# Patient Record
Sex: Female | Born: 1935 | Race: White | Hispanic: No | Smoking: Never smoker
Health system: Southern US, Community
[De-identification: ages and names within clinical notes are randomized; demographics above are authoritative.]

## PROBLEM LIST (undated history)

## (undated) DIAGNOSIS — C50911 Malignant neoplasm of unspecified site of right female breast: Secondary | ICD-10-CM

## (undated) DIAGNOSIS — F0390 Unspecified dementia without behavioral disturbance: Secondary | ICD-10-CM

## (undated) DIAGNOSIS — IMO0001 Reserved for inherently not codable concepts without codable children: Secondary | ICD-10-CM

## (undated) DIAGNOSIS — K3184 Gastroparesis: Secondary | ICD-10-CM

## (undated) DIAGNOSIS — M810 Age-related osteoporosis without current pathological fracture: Principal | ICD-10-CM

## (undated) DIAGNOSIS — K219 Gastro-esophageal reflux disease without esophagitis: Secondary | ICD-10-CM

## (undated) DIAGNOSIS — C801 Malignant (primary) neoplasm, unspecified: Secondary | ICD-10-CM

## (undated) DIAGNOSIS — K8020 Calculus of gallbladder without cholecystitis without obstruction: Secondary | ICD-10-CM

## (undated) DIAGNOSIS — I10 Essential (primary) hypertension: Secondary | ICD-10-CM

## (undated) DIAGNOSIS — N289 Disorder of kidney and ureter, unspecified: Secondary | ICD-10-CM

## (undated) HISTORY — DX: Malignant neoplasm of unspecified site of right female breast: C50.911

## (undated) HISTORY — DX: Essential (primary) hypertension: I10

## (undated) HISTORY — DX: Gastro-esophageal reflux disease without esophagitis: K21.9

## (undated) HISTORY — DX: Age-related osteoporosis without current pathological fracture: M81.0

## (undated) HISTORY — PX: BREAST LUMPECTOMY: SHX2

## (undated) HISTORY — DX: Gastroparesis: K31.84

## (undated) HISTORY — DX: Reserved for inherently not codable concepts without codable children: IMO0001

## (undated) HISTORY — DX: Calculus of gallbladder without cholecystitis without obstruction: K80.20

## (undated) HISTORY — PX: TONSILLECTOMY AND ADENOIDECTOMY: SHX28

## (undated) HISTORY — DX: Unspecified dementia, unspecified severity, without behavioral disturbance, psychotic disturbance, mood disturbance, and anxiety: F03.90

## (undated) HISTORY — PX: BREAST BIOPSY: SHX20

---

## 1999-05-12 ENCOUNTER — Encounter: Payer: Self-pay | Admitting: Emergency Medicine

## 1999-05-12 ENCOUNTER — Emergency Department (HOSPITAL_COMMUNITY): Admission: EM | Admit: 1999-05-12 | Discharge: 1999-05-12 | Payer: Self-pay | Admitting: Emergency Medicine

## 2007-05-10 HISTORY — PX: COLONOSCOPY: SHX174

## 2007-05-17 ENCOUNTER — Inpatient Hospital Stay (HOSPITAL_COMMUNITY): Admission: EM | Admit: 2007-05-17 | Discharge: 2007-05-22 | Payer: Self-pay | Admitting: Emergency Medicine

## 2007-05-18 ENCOUNTER — Encounter (INDEPENDENT_AMBULATORY_CARE_PROVIDER_SITE_OTHER): Payer: Self-pay | Admitting: Emergency Medicine

## 2007-06-21 ENCOUNTER — Encounter: Admission: RE | Admit: 2007-06-21 | Discharge: 2007-06-21 | Payer: Self-pay | Admitting: Internal Medicine

## 2007-08-13 ENCOUNTER — Ambulatory Visit: Payer: Self-pay | Admitting: Gastroenterology

## 2007-08-24 ENCOUNTER — Ambulatory Visit: Payer: Self-pay | Admitting: Gastroenterology

## 2008-10-21 ENCOUNTER — Encounter: Admission: RE | Admit: 2008-10-21 | Discharge: 2008-10-21 | Payer: Self-pay | Admitting: Internal Medicine

## 2008-11-14 ENCOUNTER — Emergency Department (HOSPITAL_COMMUNITY): Admission: EM | Admit: 2008-11-14 | Discharge: 2008-11-14 | Payer: Self-pay

## 2009-07-28 ENCOUNTER — Encounter: Admission: RE | Admit: 2009-07-28 | Discharge: 2009-07-28 | Payer: Self-pay | Admitting: Internal Medicine

## 2009-08-17 ENCOUNTER — Encounter: Admission: RE | Admit: 2009-08-17 | Discharge: 2009-08-17 | Payer: Self-pay | Admitting: Internal Medicine

## 2009-08-20 ENCOUNTER — Ambulatory Visit: Payer: Self-pay | Admitting: Oncology

## 2009-08-24 LAB — CBC WITH DIFFERENTIAL/PLATELET
Basophils Absolute: 0 10*3/uL (ref 0.0–0.1)
EOS%: 1.6 % (ref 0.0–7.0)
HGB: 12.4 g/dL (ref 11.6–15.9)
MCH: 31.6 pg (ref 25.1–34.0)
MONO#: 0.5 10*3/uL (ref 0.1–0.9)
NEUT#: 3.6 10*3/uL (ref 1.5–6.5)
RDW: 14.1 % (ref 11.2–14.5)
WBC: 5.8 10*3/uL (ref 3.9–10.3)
lymph#: 1.6 10*3/uL (ref 0.9–3.3)

## 2009-08-24 LAB — COMPREHENSIVE METABOLIC PANEL
ALT: 9 U/L (ref 0–35)
AST: 14 U/L (ref 0–37)
Albumin: 4.3 g/dL (ref 3.5–5.2)
BUN: 22 mg/dL (ref 6–23)
Calcium: 9.5 mg/dL (ref 8.4–10.5)
Chloride: 105 mEq/L (ref 96–112)
Potassium: 4.2 mEq/L (ref 3.5–5.3)

## 2009-11-11 ENCOUNTER — Emergency Department (HOSPITAL_COMMUNITY): Admission: EM | Admit: 2009-11-11 | Discharge: 2009-11-11 | Payer: Self-pay | Admitting: Emergency Medicine

## 2009-11-12 ENCOUNTER — Ambulatory Visit: Payer: Self-pay | Admitting: Oncology

## 2009-11-16 ENCOUNTER — Encounter (INDEPENDENT_AMBULATORY_CARE_PROVIDER_SITE_OTHER): Payer: Self-pay | Admitting: *Deleted

## 2009-11-16 ENCOUNTER — Encounter: Admission: RE | Admit: 2009-11-16 | Discharge: 2009-11-16 | Payer: Self-pay | Admitting: Internal Medicine

## 2009-11-16 LAB — COMPREHENSIVE METABOLIC PANEL
AST: 15 U/L (ref 0–37)
Albumin: 4.8 g/dL (ref 3.5–5.2)
BUN: 23 mg/dL (ref 6–23)
CO2: 24 mEq/L (ref 19–32)
Calcium: 10.5 mg/dL (ref 8.4–10.5)
Chloride: 107 mEq/L (ref 96–112)
Glucose, Bld: 164 mg/dL — ABNORMAL HIGH (ref 70–99)
Potassium: 3.7 mEq/L (ref 3.5–5.3)

## 2009-11-16 LAB — CBC WITH DIFFERENTIAL/PLATELET
Basophils Absolute: 0 10*3/uL (ref 0.0–0.1)
Eosinophils Absolute: 0 10*3/uL (ref 0.0–0.5)
HCT: 43.7 % (ref 34.8–46.6)
HGB: 14.2 g/dL (ref 11.6–15.9)
MONO#: 0.5 10*3/uL (ref 0.1–0.9)
NEUT#: 6.7 10*3/uL — ABNORMAL HIGH (ref 1.5–6.5)
NEUT%: 79 % — ABNORMAL HIGH (ref 38.4–76.8)
RDW: 13.4 % (ref 11.2–14.5)
lymph#: 1.3 10*3/uL (ref 0.9–3.3)

## 2009-12-01 ENCOUNTER — Encounter: Payer: Self-pay | Admitting: Nurse Practitioner

## 2009-12-01 ENCOUNTER — Telehealth: Payer: Self-pay | Admitting: Gastroenterology

## 2009-12-02 DIAGNOSIS — F0391 Unspecified dementia with behavioral disturbance: Secondary | ICD-10-CM

## 2009-12-02 DIAGNOSIS — F03918 Unspecified dementia, unspecified severity, with other behavioral disturbance: Secondary | ICD-10-CM | POA: Insufficient documentation

## 2009-12-02 DIAGNOSIS — C50911 Malignant neoplasm of unspecified site of right female breast: Secondary | ICD-10-CM | POA: Insufficient documentation

## 2009-12-02 HISTORY — DX: Malignant neoplasm of unspecified site of right female breast: C50.911

## 2009-12-03 ENCOUNTER — Ambulatory Visit: Payer: Self-pay | Admitting: Internal Medicine

## 2009-12-03 ENCOUNTER — Encounter: Payer: Self-pay | Admitting: Gastroenterology

## 2009-12-03 DIAGNOSIS — R112 Nausea with vomiting, unspecified: Secondary | ICD-10-CM

## 2009-12-03 DIAGNOSIS — R634 Abnormal weight loss: Secondary | ICD-10-CM | POA: Insufficient documentation

## 2009-12-03 DIAGNOSIS — R1314 Dysphagia, pharyngoesophageal phase: Secondary | ICD-10-CM

## 2009-12-04 ENCOUNTER — Ambulatory Visit (HOSPITAL_COMMUNITY): Admission: RE | Admit: 2009-12-04 | Discharge: 2009-12-04 | Payer: Self-pay | Admitting: Internal Medicine

## 2009-12-11 ENCOUNTER — Ambulatory Visit: Payer: Self-pay | Admitting: Gastroenterology

## 2009-12-11 HISTORY — PX: ESOPHAGOGASTRODUODENOSCOPY: SHX1529

## 2010-01-12 ENCOUNTER — Ambulatory Visit: Payer: Self-pay | Admitting: Oncology

## 2010-01-13 LAB — COMPREHENSIVE METABOLIC PANEL
BUN: 14 mg/dL (ref 6–23)
CO2: 27 mEq/L (ref 19–32)
Calcium: 9.8 mg/dL (ref 8.4–10.5)
Chloride: 105 mEq/L (ref 96–112)
Creatinine, Ser: 1.4 mg/dL — ABNORMAL HIGH (ref 0.40–1.20)
Total Bilirubin: 0.6 mg/dL (ref 0.3–1.2)

## 2010-01-13 LAB — CBC WITH DIFFERENTIAL/PLATELET
BASO%: 0.4 % (ref 0.0–2.0)
Basophils Absolute: 0 10*3/uL (ref 0.0–0.1)
EOS%: 1.4 % (ref 0.0–7.0)
HCT: 34.8 % (ref 34.8–46.6)
HGB: 11.6 g/dL (ref 11.6–15.9)
LYMPH%: 30.1 % (ref 14.0–49.7)
MCH: 30.7 pg (ref 25.1–34.0)
MCHC: 33.3 g/dL (ref 31.5–36.0)
NEUT%: 60 % (ref 38.4–76.8)
Platelets: 222 10*3/uL (ref 145–400)
lymph#: 1.8 10*3/uL (ref 0.9–3.3)

## 2010-01-13 LAB — MAGNESIUM: Magnesium: 2.3 mg/dL (ref 1.5–2.5)

## 2010-04-12 ENCOUNTER — Ambulatory Visit (HOSPITAL_BASED_OUTPATIENT_CLINIC_OR_DEPARTMENT_OTHER): Payer: Medicare Other | Admitting: Oncology

## 2010-04-14 LAB — COMPREHENSIVE METABOLIC PANEL
ALT: 45 U/L — ABNORMAL HIGH (ref 0–35)
AST: 27 U/L (ref 0–37)
Albumin: 3.5 g/dL (ref 3.5–5.2)
Calcium: 9.5 mg/dL (ref 8.4–10.5)
Chloride: 110 mEq/L (ref 96–112)
Potassium: 4.5 mEq/L (ref 3.5–5.3)
Sodium: 147 mEq/L — ABNORMAL HIGH (ref 135–145)

## 2010-04-14 LAB — CBC WITH DIFFERENTIAL/PLATELET
BASO%: 0.5 % (ref 0.0–2.0)
EOS%: 1.5 % (ref 0.0–7.0)
HGB: 12.6 g/dL (ref 11.6–15.9)
MCH: 29.2 pg (ref 25.1–34.0)
MCHC: 31.8 g/dL (ref 31.5–36.0)
RDW: 12.8 % (ref 11.2–14.5)
lymph#: 1.7 10*3/uL (ref 0.9–3.3)

## 2010-04-29 ENCOUNTER — Inpatient Hospital Stay (HOSPITAL_COMMUNITY): Admission: EM | Admit: 2010-04-29 | Discharge: 2010-05-03 | Payer: Self-pay | Source: Home / Self Care

## 2010-05-06 ENCOUNTER — Telehealth: Payer: Self-pay | Admitting: Gastroenterology

## 2010-05-10 ENCOUNTER — Inpatient Hospital Stay (HOSPITAL_COMMUNITY)
Admission: EM | Admit: 2010-05-10 | Discharge: 2010-05-17 | Payer: Self-pay | Source: Home / Self Care | Attending: Internal Medicine | Admitting: Internal Medicine

## 2010-05-12 LAB — DIFFERENTIAL
Basophils Absolute: 0 10*3/uL (ref 0.0–0.1)
Basophils Relative: 0 % (ref 0–1)
Eosinophils Absolute: 0 10*3/uL (ref 0.0–0.7)
Eosinophils Relative: 0 % (ref 0–5)
Lymphocytes Relative: 23 % (ref 12–46)
Lymphs Abs: 1.7 10*3/uL (ref 0.7–4.0)
Monocytes Absolute: 0.4 10*3/uL (ref 0.1–1.0)
Monocytes Relative: 6 % (ref 3–12)
Neutro Abs: 5.4 10*3/uL (ref 1.7–7.7)
Neutrophils Relative %: 71 % (ref 43–77)

## 2010-05-12 LAB — HEPATIC FUNCTION PANEL
ALT: 28 U/L (ref 0–35)
AST: 18 U/L (ref 0–37)
Albumin: 4.1 g/dL (ref 3.5–5.2)
Alkaline Phosphatase: 60 U/L (ref 39–117)
Bilirubin, Direct: 0.1 mg/dL (ref 0.0–0.3)
Indirect Bilirubin: 0.4 mg/dL (ref 0.3–0.9)
Total Bilirubin: 0.5 mg/dL (ref 0.3–1.2)
Total Protein: 7.5 g/dL (ref 6.0–8.3)

## 2010-05-12 LAB — CBC
HCT: 42.2 % (ref 36.0–46.0)
Hemoglobin: 13.2 g/dL (ref 12.0–15.0)
MCH: 29.3 pg (ref 26.0–34.0)
MCHC: 31.3 g/dL (ref 30.0–36.0)
MCV: 93.6 fL (ref 78.0–100.0)
Platelets: 230 10*3/uL (ref 150–400)
RBC: 4.51 MIL/uL (ref 3.87–5.11)
RDW: 13.9 % (ref 11.5–15.5)
WBC: 7.6 10*3/uL (ref 4.0–10.5)

## 2010-05-12 LAB — BASIC METABOLIC PANEL
BUN: 18 mg/dL (ref 6–23)
CO2: 23 mEq/L (ref 19–32)
Calcium: 9.9 mg/dL (ref 8.4–10.5)
Chloride: 119 mEq/L — ABNORMAL HIGH (ref 96–112)
Creatinine, Ser: 1.65 mg/dL — ABNORMAL HIGH (ref 0.4–1.2)
GFR calc Af Amer: 37 mL/min — ABNORMAL LOW (ref >60)
GFR calc non Af Amer: 31 mL/min — ABNORMAL LOW (ref >60)
Glucose, Bld: 108 mg/dL — ABNORMAL HIGH (ref 70–99)
Potassium: 4.1 mEq/L (ref 3.5–5.1)
Sodium: 152 mEq/L — ABNORMAL HIGH (ref 135–145)

## 2010-05-14 LAB — DIFFERENTIAL
Basophils Absolute: 0 10*3/uL (ref 0.0–0.1)
Basophils Relative: 0 % (ref 0–1)
Eosinophils Absolute: 0.1 10*3/uL (ref 0.0–0.7)
Eosinophils Relative: 1 % (ref 0–5)
Lymphocytes Relative: 33 % (ref 12–46)
Lymphs Abs: 2.5 10*3/uL (ref 0.7–4.0)
Monocytes Absolute: 0.5 10*3/uL (ref 0.1–1.0)
Monocytes Relative: 7 % (ref 3–12)
Neutro Abs: 4.3 10*3/uL (ref 1.7–7.7)
Neutrophils Relative %: 58 % (ref 43–77)

## 2010-05-14 LAB — COMPREHENSIVE METABOLIC PANEL
ALT: 21 U/L (ref 0–35)
AST: 17 U/L (ref 0–37)
Albumin: 3.3 g/dL — ABNORMAL LOW (ref 3.5–5.2)
Alkaline Phosphatase: 47 U/L (ref 39–117)
BUN: 17 mg/dL (ref 6–23)
CO2: 27 mEq/L (ref 19–32)
Calcium: 9.5 mg/dL (ref 8.4–10.5)
Chloride: 111 mEq/L (ref 96–112)
Creatinine, Ser: 1.65 mg/dL — ABNORMAL HIGH (ref 0.4–1.2)
GFR calc Af Amer: 37 mL/min — ABNORMAL LOW (ref >60)
GFR calc non Af Amer: 31 mL/min — ABNORMAL LOW (ref >60)
Glucose, Bld: 93 mg/dL (ref 70–99)
Potassium: 4.2 mEq/L (ref 3.5–5.1)
Sodium: 146 mEq/L — ABNORMAL HIGH (ref 135–145)
Total Bilirubin: 0.5 mg/dL (ref 0.3–1.2)
Total Protein: 5.9 g/dL — ABNORMAL LOW (ref 6.0–8.3)

## 2010-05-14 LAB — CBC
HCT: 37.3 % (ref 36.0–46.0)
Hemoglobin: 11.5 g/dL — ABNORMAL LOW (ref 12.0–15.0)
MCH: 28.8 pg (ref 26.0–34.0)
MCHC: 30.8 g/dL (ref 30.0–36.0)
MCV: 93.5 fL (ref 78.0–100.0)
Platelets: 191 10*3/uL (ref 150–400)
RBC: 3.99 MIL/uL (ref 3.87–5.11)
RDW: 13.5 % (ref 11.5–15.5)
WBC: 7.5 10*3/uL (ref 4.0–10.5)

## 2010-05-14 LAB — GLUCOSE, CAPILLARY: Glucose-Capillary: 96 mg/dL (ref 70–99)

## 2010-05-17 ENCOUNTER — Inpatient Hospital Stay
Admission: AD | Admit: 2010-05-17 | Discharge: 2010-11-18 | Disposition: A | Discharge status: Other Acute Inpatient Hospital | Payer: Self-pay | Source: Home / Self Care | Attending: Internal Medicine | Admitting: Internal Medicine

## 2010-05-17 DIAGNOSIS — Z79811 Long term (current) use of aromatase inhibitors: Secondary | ICD-10-CM

## 2010-05-17 DIAGNOSIS — Z853 Personal history of malignant neoplasm of breast: Secondary | ICD-10-CM

## 2010-05-17 DIAGNOSIS — Z09 Encounter for follow-up examination after completed treatment for conditions other than malignant neoplasm: Principal | ICD-10-CM

## 2010-05-17 DIAGNOSIS — N63 Unspecified lump in unspecified breast: Secondary | ICD-10-CM

## 2010-05-17 DIAGNOSIS — N6310 Unspecified lump in the right breast, unspecified quadrant: Secondary | ICD-10-CM

## 2010-05-17 HISTORY — DX: Malignant (primary) neoplasm, unspecified: C80.1

## 2010-06-01 ENCOUNTER — Ambulatory Visit (HOSPITAL_COMMUNITY)
Admission: RE | Admit: 2010-06-01 | Discharge: 2010-06-01 | Payer: Self-pay | Source: Home / Self Care | Attending: Internal Medicine | Admitting: Internal Medicine

## 2010-06-08 NOTE — Procedures (Signed)
Summary: Colonoscopy   Colonoscopy  Procedure date:  08/24/2007  Findings:      Results: Normal. Location:  [place].    Procedures Next Due Date:    Colonoscopy: 09/2017  Patient Name: [name], [name] MRN:  Procedure Procedures: Colorectal cancer screening, average risk CPT: G0121.  Personnel: Endoscopist: Pricilla Riffle. Fuller Plan, MD, [facility].  Referred By: Crist Infante, MD.  Exam Location: Exam performed in Outpatient Clinic. Outpatient  Patient Consent: Procedure, Alternatives, Risks and Benefits discussed, consent obtained, from patient. Consent was obtained by the RN.  Indications  Average Risk Screening Routine.  History  Current Medications: Patient is not currently taking Coumadin.  Pre-Exam Physical: Performed Aug 24, 2007. Cardio-pulmonary exam, Rectal exam, HEENT exam , Abdominal exam, Mental status exam WNL.  Comments: Pt. history reviewed/updated, physical exam performed prior to initiation of sedation?Yes Exam Exam: Extent of exam reached: Cecum, extent intended: Cecum.  The cecum was identified by appendiceal orifice and IC valve. Time to Cecum: 00:03: 14. Time for Withdrawl: 00:10:44. Colon retroflexion performed. ASA Classification: II. Tolerance: excellent.  Monitoring: Pulse and BP monitoring, Oximetry used. Supplemental O2 given.  Colon Prep Used MoviPrep for colon prep. Prep results: good.  Sedation Meds: Patient assessed and found to be appropriate for moderate (conscious) sedation. Fentanyl 50 mcg. given IV. Versed 7 mg. given IV.  Findings NORMAL EXAM: Cecum to Rectum.   Assessment Normal examination.  Events  Unplanned Interventions: No intervention was required.  Unplanned Events: There were no complications. Plans  Post Exam Instructions: Post sedation instructions given.  Patient Education: Patient given standard instructions for: a normal exam.  Disposition: After procedure patient sent to recovery. After  recovery patient sent home.  Scheduling/Referral: Colonoscopy, to [facility] T. Fuller Plan, MD, [facility], around Aug 23, 2017.    cc: Crist Infante, MD   This report was created from the original endoscopy report, which was reviewed and signed by the above listed endoscopist.

## 2010-06-08 NOTE — Procedures (Signed)
Summary: Upper Endoscopy  Patient: [name] Note: All result statuses are Final unless otherwise noted.  Tests: (1) Upper Endoscopy (EGD)   EGD Upper Endoscopy       [facility]     [place], [place]  [id number]           ENDOSCOPY PROCEDURE REPORT           PATIENT:  [name], [name]  MR#:  [id number]     BIRTHDATE:  1936/05/02, 73 yrs. old  GENDER:  female     ENDOSCOPIST:  Norberto Sorenson T. Fuller Plan, MD, [facility]           PROCEDURE DATE:  12/11/2009     PROCEDURE:  EGD, diagnostic     ASA CLASS:  Class II     INDICATIONS:  nausea and vomiting, weight loss, dysphagia     MEDICATIONS:  Fentanyl 25 mcg IV, Versed 2 mg IV     TOPICAL ANESTHETIC:  Exactacain Spray     DESCRIPTION OF PROCEDURE:   After the risks benefits and     alternatives of the procedure were thoroughly explained, informed     consent was obtained.  The LB GIF-H180 [id number] endoscope was     introduced through the mouth and advanced to the second portion of     the duodenum, without limitations.  The instrument was slowly     withdrawn as the mucosa was fully examined.     <<PROCEDUREIMAGES>>     The esophagus and gastroesophageal junction were completely normal     in appearance.  The stomach was entered and closely examined. The     pylorus, antrum, angularis, and lesser curvature were well     visualized, including a retroflexed view of the cardia and fundus.     The stomach wall was normally distensable. The scope passed easily     through the pylorus into the duodenum. The duodenal bulb was     normal in appearance, as was the postbulbar duodenum. Retroflexed     views revealed a hiatal hernia, small.  The scope was then     withdrawn from the patient and the procedure completed.           COMPLICATIONS:  None           ENDOSCOPIC IMPRESSION:     1) Small hiatal hernia           RECOMMENDATIONS:     1) Anti-reflux regimen, elevated HOB to 30 degrees or more or     use 4" bedblocks  long term     2) PPI bid: omeprazole 20 mg po bid     3) follow-up with PCP as planned     4) No cause for N/ or /wt. loss uncovered. Treat GERD more     aggressively for now. Dysphagia likely oropharyngeal and related     to dementia.           Pricilla Riffle. Fuller Plan, MD, [facility]           CC:  Crist Infante, MD           n.     Lorrin MaisPricilla Riffle. Abigael Mogle at 12/11/2009 04:08 PM           [facility], [id number]  Note: An exclamation mark (!) indicates a result that was not dispersed into the flowsheet. Document Creation Date: 12/11/2009 4:09 PM _______________________________________________________________________  [name]  1) Order result status: Final Collection or observation date-time: 12/11/2009 16:03 Requested date-time:  Receipt date-time:  Reported date-time:  Referring Physician:   Ordering Physician: Lucio Edward [phone]) Specimen Source:  Source: Tawanna Cooler Order Number: [id number] Lab site:

## 2010-06-08 NOTE — Letter (Signed)
Summary: EGD Instructions  [facility]  [place], [place] [address]   Phone: [phone]  Fax: [phone]       [name]    1935-07-21    MRN: [id number]       Procedure Day /Date: 12-11-09     Arrival Time: 3:00 PM      Procedure Time: 4:00 PM     Location of Procedure:                    X     [place] (4th Floor)    PREPARATION FOR ENDOSCOPY   On  12-11-09 THE DAY OF THE PROCEDURE:  1.   No solid foods, milk or milk products are allowed after midnight the night before your procedure.  2.   Do not drink anything colored red or purple.  Avoid juices with pulp.  No orange juice.  3.  You may drink clear liquids until 2:00 PM , which is 2 hours before your procedure.                                                                                                CLEAR LIQUIDS INCLUDE: Water Jello Ice Popsicles Tea (sugar ok, no milk/cream) Powdered fruit flavored drinks Coffee (sugar ok, no milk/cream) Gatorade Juice: apple, white grape, white cranberry  Lemonade Clear bullion, consomm, broth Carbonated beverages (any kind) Strained chicken noodle soup Hard Candy   MEDICATION INSTRUCTIONS  Unless otherwise instructed, you should take regular prescription medications with a small sip of water as early as possible the morning of your procedure.       OTHER INSTRUCTIONS  You will need a responsible adult at least 73 years of age to accompany you and drive you home.   This person must remain in the waiting room during your procedure.  Wear loose fitting clothing that is easily removed.  Leave jewelry and other valuables at home.  However, you may wish to bring a book to read or an iPod/MP3 player to listen to music as you wait for your procedure to start.  Remove all body piercing jewelry and leave at home.  Total time from sign-in until discharge is approximately 2-3 hours.  You should go home directly after your  procedure and rest.  You can resume normal activities the day after your procedure.  The day of your procedure you should not:   Drive   Make legal decisions   Operate machinery   Drink alcohol   Return to work  You will receive specific instructions about eating, activities and medications before you leave.    The above instructions have been reviewed and explained to me by   _______________________    I fully understand and can verbalize these instructions _____________________________ Date _________

## 2010-06-08 NOTE — Letter (Signed)
Summary: [facility]  [facility]   Imported By: Phillis Knack 12/11/2009 11:13:54  _____________________________________________________________________  External Attachment:    Type:   Image     Comment:   External Document

## 2010-06-08 NOTE — Assessment & Plan Note (Signed)
Summary: dyspahgia/vomiting/wt loss/ hx breast CA/sheri   History of Present Illness Visit Type: Initial Consult Primary GI MD: Joylene Igo MD [facility] Primary Provider: Crist Infante MD Requesting Provider: Crist Infante MD Chief Complaint: dysphagia-solids, weight loss, n,v History of Present Illness:   Patient is a 75 year old female seen in 2009  by Dr. Fuller Plan for screening colonoscopy. Patient has dementia, she is accompanied by her daughter who provides the history. According to the daughter, patient developed nausea and vomiting three weeks ago. There has been significant weight loss in just a few weeks. Initially patient was gagging on solid food. Now, since food changed to pureed consistency, patient often pockets food in one side of her mouth. Whenever patient does actually eat, she seems to have nausea and vomiting. Daughter often feeds patient and denies any gagging or obvious dysphagia to pureed foods or liquids.  Went to ER 11/11/09 for nausea and vomiting. Reviewed records - CBC normal, CMET normal (even albumin), Lipase normal, Acute abdominal series normal.  Patient is on Omeprazole. Daughter doesn't know of GERD history but patient has had excessive belching over last couple of years. PCP recently increased Omeprazole to twice daily.    Stools often small and hard. Defecates only 1-2 times a week. Has Miralax and Colace ordered as needed but cannot ask for them so daughter concerned about constipation. Daughter now keeping track of BMs.   Diagnosed with breast cancer in April. She is on Arimadex.     GI Review of Systems    Reports dysphagia with solids, nausea, vomiting, and  weight loss.   Weight loss of 20 pounds over 1 month.   Denies abdominal pain, acid reflux, belching, bloating, chest pain, dysphagia with liquids, heartburn, loss of appetite, vomiting blood, and  weight gain.      Reports constipation.     Denies anal fissure, black tarry stools, change in bowel habit,  diarrhea, diverticulosis, fecal incontinence, heme positive stool, hemorrhoids, irritable bowel syndrome, jaundice, light color stool, liver problems, rectal bleeding, and  rectal pain.    Current Medications (verified): 1)  Arimidex 1 Mg Tabs (Anastrozole) .... Once Daily 2)  Omeprazole 20 Mg Cpdr (Omeprazole) .... Once Daily 3)  Depakote 500 Mg Tbec (Divalproex Sodium) .... Take 1 & 1/2 Tablet By Mouth Once Daily 4)  Haloperidol 0.5 Mg Tabs (Haloperidol) .... Once Daily 5)  Alendronate Sodium 70 Mg Tabs (Alendronate Sodium) .[name].. 1 By Mouth Weekly 6)  Mapap 325 Mg Tabs (Acetaminophen) .... As Needed 7)  Lorazepam 0.5 Mg Tabs (Lorazepam) .[name].. 1 By Mouth Two Times A Day As Needed 8)  Promethazine Hcl 12.5 Mg Tabs (Promethazine Hcl) .[name].. 1 By Mouth Q 8 Hours As Needed 9)  Zofran Odt 8 Mg Tbdp (Ondansetron) .[name].. 1 By Mouth Q 6 Hours As Needed 10)  Meclizine Hcl 12.5 Mg Tabs (Meclizine Hcl) .[name].. 1 By Mouth Q 6 Hours As Needed  Allergies (verified): No Known Drug Allergies  Past History:  Past Medical History: CKD DEMENTIA (ICD-294.8) Hx of BREAST CANCER (ICD-174.9) OSTEOPOROSIS BIPOLAR  Family History: Family History of Breast Cancer: sister Family History of Colon Cancer:  Social History: Reviewed history from 12/02/2009 and no changes required. Patient has never smoked.  Alcohol Use - no Illicit Drug Use - no  Review of Systems       Not obtained, patient has dementia. All history comes from daughter.  Vital Signs:  Patient profile:   75 year old female Height:  66 inches Weight:      118 pounds BMI:     19.11 Pulse rate:   88 / minute Pulse rhythm:   regular BP sitting:   110 / 70  (left arm)  Vitals Entered By: Christian Mate CMA [name]) (December 03, 2009 1:41 PM)  Physical Exam  General:  In a wheelchair, NAD Head:  Normocephalic and atraumatic. Eyes:  Conjunctiva pink, no icterus.  Mouth:  No oral lesions. Tongue moist.  Neck:  no obvious masses  Lungs:   Clear throughout to auscultation. Heart:  Regular rate and rhythm; no murmurs, rubs,  or bruits. Abdomen:  Limited exam, in wheelchair. Abdomen soft, non-distended..  Extremities:  No palmar erythema, no edema.  Neurologic:  Alert, quiet. Doesn't contribute to conversation.  Skin:  Intact without significant lesions or rashes. Cervical Nodes:  No significant cervical adenopathy. Psych:  Pleasant   Impression & Recommendations:  Problem # 1:  DYSPHAGIA [id number]) Picture not clear cut. Patient has transfer dysphagia as she often pockets pureed foods in one side of mouth. There may be esophageal dysphagia as well because of "gagging" with solids. Daughter assists with meals and doesn't get impression that liquids nor pureed items get stuck in esophagus. However, if patient does consume pureed diet she may have subsequent nausea and vomiting. Her significant weight loss in setting of these symptoms is concerning.  For further evaluation patient will be scheduled for an EGD with biopsies/ esophageal dilation ( if indicated).  The risks and benefits of the procedure, as well as alternatives were discussed with the patient's daughter and she agrees to proceed. Patient may need eventual modified barium swallow for assessment of oropharyngeal dysphagia as well.     Orders: EGD (EGD)  Problem # 2:  NAUSEA AND VOMITING (ICD-787.01) Assessment: Deteriorated See #1  Problem # 3:  WEIGHT LOSS-ABNORMAL (ICD-783.21) Assessment: Deteriorated See #1. Orders: EGD (EGD)  Problem # 4:  Hx of BREAST CANCER (ICD-174.9) Diagnosed April 2011. On Arimadex  Problem # 5:  DEMENTIA (ICD-294.8) Assessment: Comment Only Doesn't contribute much to conversations.  Patient Instructions: 1)  We scheduled the Endoscopy with Dr. Fuller Plan for 12-11-09. 2)  Directions and brochure provided. 3)  [facility] Patient Information Guide given to patient. 4)  Copy sent to : Dr. Crist Infante 5)  The medication  list was reviewed and reconciled.  All changed / newly prescribed medications were explained.  A complete medication list was provided to the patient / caregiver.

## 2010-06-08 NOTE — Progress Notes (Signed)
Summary: Triage  Phone Note From Other Clinic   Caller: [facility]  [phone] Call For: Dr. Fuller Plan Summary of Call: Weight loss unexplained, nausea, vomiting...trying to sch'd a barrium swallow for this week....but needs appt. w/Dr. Fuller Plan ASAP Initial call taken by: Webb Laws,  December 01, 2009 11:57 AM  Follow-up for Phone Call        Per Dr Joylene Draft patient needs asap appointment for severe dysphagia, wt loss, nausea and vomiting.  Patient  has a hx of breast CA and is being treated with hormorne therapy only.  Patient  will come in and  see Tye Savoy RNP 12/03/09 1:30 Follow-up by: Barb Merino RN, CGRN,  December 01, 2009 12:17 PM

## 2010-06-10 NOTE — Progress Notes (Signed)
Summary: Needs a gastric emptying study scheduled  Phone Note Call from Patient Call back at Home Phone [phone]   Caller: [name] daughter [name] Call For: [name] Summary of Call: Was just discharged from hospital and was told she needs to have a gastric emptying study done. Can this be scheduled for her? Initial call taken by: Irwin Brakeman [facility],  May 06, 2010 10:39 AM  Follow-up for Phone Call        Message left for patients daughter to call back. Leone Payor RN  May 06, 2010 11:01 AM Patient's daughter [name] returned our call. Patient was inpt at [facility] over the holiday. The hospitalist recommended she get a GES as an outpatient. [name] is not sure if April. Fuller Thompson would order this or patient's PCP( [name]) Patient is scheduled to see [name] in 2 weeks. Should we schedule this or PCP? [name] is aware April. Fuller Thompson will not be back in office until next week. Please, advise. Follow-up by: Leone Payor RN,  May 06, 2010 1:43 PM  Additional Follow-up for Phone Call Additional follow up Details #1::        She should have follow up with [name] and he can decide. We did not see her at [facility] or recommend the GES. Additional Follow-up by: Ladene Artist MD [name] 2:27 PM    Additional Follow-up for Phone Call Additional follow up Details #2::    Message left for patient's daughter  to call back. Leone Payor RN  May 11, 2010 8:30 AM Patient's daughter called to let us know patient was admitted to [facility] over the weekend. [name], RNP has been by and consulted. GES has been done since patient was admitted. Follow-up by: Leone Payor RN,  May 11, 2010 4:31 PM  Additional Follow-up for Phone Call Additional follow up Details #3:: Details for Additional Follow-up Action Taken: We are seeing her in consult and she had the GES today. Additional Follow-up by: Ladene Artist MD [facility],  May 11, 2010 10:15 PM

## 2010-06-13 NOTE — Discharge Summary (Addendum)
[name], [name]                ACCOUNT NO.:  [id number]  MEDICAL RECORD NO.:  [id number]          PATIENT TYPE:  INP  LOCATION:  A307                          FACILITY:  [facility]  PHYSICIAN:  Evie Lacks. Plotnikov, MDDATE OF BIRTH:  [birth year]  DATE OF ADMISSION:  04/29/2010 DATE OF DISCHARGE:  12/26/2011LH                              DISCHARGE SUMMARY   DISCHARGE DIAGNOSES: 1. Chronic nausea, vomiting, and weight loss of unclear etiology,     resolved on pureed/liquid diet. 2. Dehydration, corrected. 3. Advanced dementia. 4. Bipolar disorder. 5. Breast cancer being treated with Arimidex. 6. Chronic kidney disease. 7. Hypertension. 8. Elevated cortisol (repeat cortisol was normal; aldosterone and ACTH     is pending). 9. Cholelithiasis with abnormal HIDA (surgical consultation with Dr.     Arnoldo Morale was obtained.  The decision was made not to operate). 10.Hypernatremia, corrected.  CONSULTATIONS:  Dr. Arnoldo Morale, Surgery.  TESTS:  HIDA scan, abdominal ultrasound, head CT, abdominal CT, chest x- ray.  DISCHARGE INSTRUCTIONS:  The patient will be admitted to [facility].  DIET:  Pureed diet, Ensure Plus 1 three times a day.  I will start physical therapy for deconditioning.  Encourage fluid intake.  HISTORY:  The patient is a 75 year old female with dementia and a long history of failure to thrive with nausea, vomiting, and gagging, who was previously extensively worked up by Dr. Joylene Draft; by [facility]. She was admitted to [facility] with nausea, vomiting, and dehydration on April 29, 2010.  For the details, please address to Dr. Jenne Campus history and physical.  HOSPITAL COURSE:  During the course of hospitalization, the patient underwent radiologic tests.  She received IV fluids.  She was seen by Dr. Arnoldo Morale for surgical consultation.  She seemed to be able to tolerate liquids like Ensure, baby food, ice cream, pureed soups quite well.  She  would chew and spit out solid foods.  On the day of discharge, she has no active complaints.  She is back to her baseline according to her daughter, [name].  Her temperature is 98.2, heart rate 66, respirations 18, blood pressure 134/84, sats 99% on room air.  White cell 5.6, hemoglobin 12.2, platelets 214, sodium 146, potassium 4.2, creatinine 1.42, BUN 12.  HEENT with moist mucosa.  Neck supple.  Lungs with decreased breath sounds at bases.  Heart S1 and S2, grade 2/6 systolic murmur.  No gallop.  Abdomen soft, nontender, and no organomegaly, no mass felt.  Lower extremities without edema.  She is alert, cooperative, disoriented.  Skin clear without jaundice.  LABORATORY DATA:  On admission, her white count was 5.8, hemoglobin 13.5, on discharge 12.2, MCV 88.6, platelet count 274,000.  INR 0.9, sodium 144-153-143-146, potassium 4.2, glucose 94-127-117-91, creatinine 1.51 on admission, 1.42 on discharge.  Cardiac markers negative.  TSH 0.725, free T4 1.40.  On December 23, her morning cortisol was 32.5 and elevated.  On December 25, it was 19.1.  Urinalysis normal.  On December 22, her CT scan of the abdomen revealed gallstones with sludge.  The fundal region of the stomach appeared thickened posteriorly.  No acute abdominal  or pelvic findings.  There were scattered sclerotic bone lesions but no destructive bony changes.  CT scan of the head showed atrophy with small-vessel chronic ischemic changes.  Abdominal ultrasound revealed cholelithiasis with no sonographic evidence for acute cholecystitis, relatively small kidneys with increased echogenicity of the cortex.  The findings were consistent with medical renal disease.  HIDA scan revealed the initial phase of the hepatobiliary scan to be normal; however, the patient had an abnormal low ejection fraction of only 17.1% at 30 minutes.     Evie Lacks. Plotnikov, MD     AVP/MEDQ  D:  05/03/2010  T:  05/04/2010  Job:  [id number]  cc:    Elta Guadeloupe A. Perini, M.D. [phone]  Electronically Signed by Lew Dawes MD on 06/13/2010 03:20:48 PM

## 2010-07-19 LAB — BASIC METABOLIC PANEL
BUN: 12 mg/dL (ref 6–23)
BUN: 15 mg/dL (ref 6–23)
CO2: 26 mEq/L (ref 19–32)
CO2: 26 mEq/L (ref 19–32)
Calcium: 10.3 mg/dL (ref 8.4–10.5)
Calcium: 9.6 mg/dL (ref 8.4–10.5)
Calcium: 9.8 mg/dL (ref 8.4–10.5)
Creatinine, Ser: 1.42 mg/dL — ABNORMAL HIGH (ref 0.4–1.2)
Creatinine, Ser: 1.66 mg/dL — ABNORMAL HIGH (ref 0.4–1.2)
Creatinine, Ser: 1.68 mg/dL — ABNORMAL HIGH (ref 0.4–1.2)
GFR calc Af Amer: 37 mL/min — ABNORMAL LOW (ref >60)
GFR calc non Af Amer: 30 mL/min — ABNORMAL LOW (ref >60)
GFR calc non Af Amer: 30 mL/min — ABNORMAL LOW (ref >60)
GFR calc non Af Amer: 36 mL/min — ABNORMAL LOW (ref >60)
Glucose, Bld: 117 mg/dL — ABNORMAL HIGH (ref 70–99)
Glucose, Bld: 91 mg/dL (ref 70–99)
Sodium: 143 mEq/L (ref 135–145)
Sodium: 146 mEq/L — ABNORMAL HIGH (ref 135–145)
Sodium: 153 mEq/L — ABNORMAL HIGH (ref 135–145)

## 2010-07-19 LAB — URINALYSIS, ROUTINE W REFLEX MICROSCOPIC
Bilirubin Urine: NEGATIVE
Glucose, UA: NEGATIVE mg/dL
Hgb urine dipstick: NEGATIVE
Ketones, ur: NEGATIVE mg/dL
Ketones, ur: NEGATIVE mg/dL
Nitrite: NEGATIVE
Specific Gravity, Urine: 1.015 (ref 1.005–1.030)
pH: 8 (ref 5.0–8.0)
pH: 7 (ref 5.0–8.0)

## 2010-07-19 LAB — COMPREHENSIVE METABOLIC PANEL
Albumin: 4.3 g/dL (ref 3.5–5.2)
Alkaline Phosphatase: 69 U/L (ref 39–117)
Alkaline Phosphatase: 69 U/L (ref 39–117)
BUN: 22 mg/dL (ref 6–23)
BUN: 12 mg/dL (ref 6–23)
CO2: 26 mEq/L (ref 19–32)
Chloride: 108 mEq/L (ref 96–112)
GFR calc non Af Amer: 34 mL/min — ABNORMAL LOW (ref >60)
Glucose, Bld: 94 mg/dL (ref 70–99)
Potassium: 3.7 mEq/L (ref 3.5–5.1)
Potassium: 3.9 mEq/L (ref 3.5–5.1)
Sodium: 147 mEq/L — ABNORMAL HIGH (ref 135–145)
Total Bilirubin: 0.7 mg/dL (ref 0.3–1.2)
Total Protein: 8.1 g/dL (ref 6.0–8.3)
Total Protein: 7.7 g/dL (ref 6.0–8.3)

## 2010-07-19 LAB — LIPASE, BLOOD
Lipase: 28 U/L (ref 11–59)
Lipase: 29 U/L (ref 11–59)

## 2010-07-19 LAB — DIFFERENTIAL
Basophils Absolute: 0 10*3/uL (ref 0.0–0.1)
Basophils Relative: 0 % (ref 0–1)
Basophils Relative: 0 % (ref 0–1)
Eosinophils Absolute: 0 10*3/uL (ref 0.0–0.7)
Eosinophils Relative: 2 % (ref 0–5)
Lymphocytes Relative: 15 % (ref 12–46)
Lymphocytes Relative: 29 % (ref 12–46)
Lymphs Abs: 1.5 10*3/uL (ref 0.7–4.0)
Lymphs Abs: 1.6 10*3/uL (ref 0.7–4.0)
Lymphs Abs: 1.7 10*3/uL (ref 0.7–4.0)
Monocytes Absolute: 0.4 10*3/uL (ref 0.1–1.0)
Monocytes Relative: 5 % (ref 3–12)
Monocytes Relative: 5 % (ref 3–12)
Neutro Abs: 3.7 10*3/uL (ref 1.7–7.7)
Neutro Abs: 5.6 10*3/uL (ref 1.7–7.7)
Neutrophils Relative %: 80 % — ABNORMAL HIGH (ref 43–77)
Neutrophils Relative %: 63 % (ref 43–77)
Neutrophils Relative %: 73 % (ref 43–77)

## 2010-07-19 LAB — CBC
HCT: 43.4 % (ref 36.0–46.0)
HCT: 40.5 % (ref 36.0–46.0)
Hemoglobin: 13.7 g/dL (ref 12.0–15.0)
MCH: 28.8 pg (ref 26.0–34.0)
MCHC: 32.7 g/dL (ref 30.0–36.0)
MCHC: 31.9 g/dL (ref 30.0–36.0)
MCHC: 32.5 g/dL (ref 30.0–36.0)
MCV: 88.6 fL (ref 78.0–100.0)
Platelets: 214 10*3/uL (ref 150–400)
Platelets: 263 10*3/uL (ref 150–400)
RBC: 4.69 MIL/uL (ref 3.87–5.11)
RDW: 13.4 % (ref 11.5–15.5)
RDW: 13 % (ref 11.5–15.5)
RDW: 13.2 % (ref 11.5–15.5)
WBC: 5.8 10*3/uL (ref 4.0–10.5)

## 2010-07-19 LAB — URINE CULTURE
Colony Count: NO GROWTH
Culture  Setup Time: 201201030135
Culture  Setup Time: 201112221135
Culture: NO GROWTH

## 2010-07-19 LAB — TSH
TSH: 0.725 u[IU]/mL (ref 0.350–4.500)
TSH: 0.775 u[IU]/mL (ref 0.350–4.500)

## 2010-07-19 LAB — ALDOSTERONE

## 2010-07-19 LAB — PROTIME-INR
INR: 0.9 (ref 0.00–1.49)
Prothrombin Time: 12.4 seconds (ref 11.6–15.2)

## 2010-07-19 LAB — T4, FREE: Free T4: 1.4 ng/dL (ref 0.80–1.80)

## 2010-07-19 LAB — URINE MICROSCOPIC-ADD ON

## 2010-07-19 LAB — AMMONIA: Ammonia: 24 umol/L (ref 11–35)

## 2010-07-25 LAB — LIPASE, BLOOD: Lipase: 23 U/L (ref 11–59)

## 2010-07-25 LAB — COMPREHENSIVE METABOLIC PANEL
Alkaline Phosphatase: 57 U/L (ref 39–117)
BUN: 11 mg/dL (ref 6–23)
CO2: 26 mEq/L (ref 19–32)
GFR calc non Af Amer: 33 mL/min — ABNORMAL LOW (ref >60)
Glucose, Bld: 139 mg/dL — ABNORMAL HIGH (ref 70–99)
Potassium: 3.8 mEq/L (ref 3.5–5.1)
Total Bilirubin: 0.4 mg/dL (ref 0.3–1.2)
Total Protein: 7.5 g/dL (ref 6.0–8.3)

## 2010-07-25 LAB — DIFFERENTIAL
Basophils Absolute: 0 10*3/uL (ref 0.0–0.1)
Basophils Relative: 0 % (ref 0–1)
Monocytes Relative: 6 % (ref 3–12)
Neutro Abs: 4.9 10*3/uL (ref 1.7–7.7)
Neutrophils Relative %: 79 % — ABNORMAL HIGH (ref 43–77)

## 2010-07-25 LAB — CBC
HCT: 39.5 % (ref 36.0–46.0)
MCV: 93.3 fL (ref 78.0–100.0)
RDW: 13 % (ref 11.5–15.5)
WBC: 6.2 10*3/uL (ref 4.0–10.5)

## 2010-07-25 LAB — VALPROIC ACID LEVEL: Valproic Acid Lvl: 17.8 ug/mL — ABNORMAL LOW (ref 50.0–100.0)

## 2010-07-25 LAB — URINALYSIS, ROUTINE W REFLEX MICROSCOPIC
Bilirubin Urine: NEGATIVE
Ketones, ur: NEGATIVE mg/dL
Nitrite: NEGATIVE
Protein, ur: NEGATIVE mg/dL

## 2010-08-11 ENCOUNTER — Encounter (HOSPITAL_BASED_OUTPATIENT_CLINIC_OR_DEPARTMENT_OTHER): Payer: Medicare Other | Admitting: Oncology

## 2010-08-11 DIAGNOSIS — F0280 Dementia in other diseases classified elsewhere without behavioral disturbance: Secondary | ICD-10-CM

## 2010-08-11 DIAGNOSIS — C50419 Malignant neoplasm of upper-outer quadrant of unspecified female breast: Secondary | ICD-10-CM

## 2010-08-11 LAB — CBC WITH DIFFERENTIAL/PLATELET
BASO%: 0.3 % (ref 0.0–2.0)
Basophils Absolute: 0 10*3/uL (ref 0.0–0.1)
EOS%: 1.8 % (ref 0.0–7.0)
HCT: 34 % — ABNORMAL LOW (ref 34.8–46.6)
HGB: 11.3 g/dL — ABNORMAL LOW (ref 11.6–15.9)
LYMPH%: 26.9 % (ref 14.0–49.7)
MCH: 29.7 pg (ref 25.1–34.0)
MCHC: 33.2 g/dL (ref 31.5–36.0)
MONO#: 0.5 10*3/uL (ref 0.1–0.9)
NEUT%: 63.2 % (ref 38.4–76.8)
Platelets: 240 10*3/uL (ref 145–400)

## 2010-08-11 LAB — COMPREHENSIVE METABOLIC PANEL
ALT: 15 U/L (ref 0–35)
BUN: 32 mg/dL — ABNORMAL HIGH (ref 6–23)
CO2: 27 mEq/L (ref 19–32)
Calcium: 9.3 mg/dL (ref 8.4–10.5)
Chloride: 106 mEq/L (ref 96–112)
Creatinine, Ser: 1.79 mg/dL — ABNORMAL HIGH (ref 0.40–1.20)
Total Bilirubin: 0.4 mg/dL (ref 0.3–1.2)

## 2010-08-16 LAB — COMPREHENSIVE METABOLIC PANEL
ALT: 15 U/L (ref 0–35)
AST: 23 U/L (ref 0–37)
Albumin: 3.8 g/dL (ref 3.5–5.2)
Alkaline Phosphatase: 51 U/L (ref 39–117)
CO2: 27 meq/L (ref 19–32)
Chloride: 105 meq/L (ref 96–112)
Creatinine, Ser: 1.62 mg/dL — ABNORMAL HIGH (ref 0.4–1.2)
GFR calc Af Amer: 38 mL/min — ABNORMAL LOW (ref >60)
GFR calc non Af Amer: 31 mL/min — ABNORMAL LOW (ref >60)
Potassium: 4.4 meq/L (ref 3.5–5.1)
Sodium: 140 meq/L (ref 135–145)
Total Bilirubin: 0.4 mg/dL (ref 0.3–1.2)

## 2010-08-16 LAB — POCT CARDIAC MARKERS: Myoglobin, poc: 95.4 ng/mL (ref 12–200)

## 2010-08-16 LAB — CROSSMATCH: ABO/RH(D): O POS

## 2010-08-16 LAB — DIFFERENTIAL
Basophils Absolute: 0 10*3/uL (ref 0.0–0.1)
Eosinophils Absolute: 0.1 10*3/uL (ref 0.0–0.7)
Eosinophils Relative: 1 % (ref 0–5)
Lymphocytes Relative: 37 % (ref 12–46)
Monocytes Absolute: 0.6 10*3/uL (ref 0.1–1.0)

## 2010-08-16 LAB — URINALYSIS, ROUTINE W REFLEX MICROSCOPIC
Bilirubin Urine: NEGATIVE
Ketones, ur: NEGATIVE mg/dL
Nitrite: NEGATIVE
Urobilinogen, UA: 0.2 mg/dL (ref 0.0–1.0)

## 2010-08-16 LAB — URINE CULTURE

## 2010-08-16 LAB — POCT I-STAT, CHEM 8
BUN: 27 mg/dL — ABNORMAL HIGH (ref 6–23)
Creatinine, Ser: 1.7 mg/dL — ABNORMAL HIGH (ref 0.4–1.2)
Glucose, Bld: 93 mg/dL (ref 70–99)
Potassium: 4.4 meq/L (ref 3.5–5.1)
Sodium: 138 meq/L (ref 135–145)
TCO2: 24 mmol/L (ref 0–100)

## 2010-08-16 LAB — PROTIME-INR: Prothrombin Time: 13 s (ref 11.6–15.2)

## 2010-08-16 LAB — CBC
Platelets: 173 10*3/uL (ref 150–400)
RBC: 3.87 MIL/uL (ref 3.87–5.11)
WBC: 5.5 10*3/uL (ref 4.0–10.5)

## 2010-08-16 LAB — RAPID URINE DRUG SCREEN, HOSP PERFORMED
Amphetamines: NOT DETECTED
Benzodiazepines: NOT DETECTED
Tetrahydrocannabinol: NOT DETECTED

## 2010-08-16 LAB — ABO/RH: ABO/RH(D): O POS

## 2010-09-07 ENCOUNTER — Encounter (HOSPITAL_COMMUNITY): Payer: Medicare Other | Attending: Oncology | Admitting: Oncology

## 2010-09-07 DIAGNOSIS — F319 Bipolar disorder, unspecified: Secondary | ICD-10-CM | POA: Insufficient documentation

## 2010-09-07 DIAGNOSIS — C50919 Malignant neoplasm of unspecified site of unspecified female breast: Secondary | ICD-10-CM | POA: Insufficient documentation

## 2010-09-07 DIAGNOSIS — Z79899 Other long term (current) drug therapy: Secondary | ICD-10-CM | POA: Insufficient documentation

## 2010-09-07 DIAGNOSIS — F0390 Unspecified dementia without behavioral disturbance: Secondary | ICD-10-CM | POA: Insufficient documentation

## 2010-09-15 ENCOUNTER — Inpatient Hospital Stay (HOSPITAL_COMMUNITY): Payer: Medicare Other

## 2010-09-15 ENCOUNTER — Ambulatory Visit (HOSPITAL_COMMUNITY): Payer: Medicare Other | Attending: Oncology

## 2010-09-15 DIAGNOSIS — C50919 Malignant neoplasm of unspecified site of unspecified female breast: Secondary | ICD-10-CM | POA: Insufficient documentation

## 2010-09-21 NOTE — H&P (Signed)
NAMEKaydon, [name]                  ACCOUNT NO.:  [id number]   MEDICAL RECORD NO.:  [id number]          PATIENT TYPE:  EMS   LOCATION:  MINO                         FACILITY:  [facility]   PHYSICIAN:  Mark A. Perini, M.D.   DATE OF BIRTH:  1935-06-28   DATE OF ADMISSION:  05/17/2007  DATE OF DISCHARGE:                              HISTORY & PHYSICAL   CHIEF COMPLAINT:  Nausea and vomiting.   HISTORY OF PRESENT ILLNESS:  [name] is a pleasant 75 year old female who I  am meeting for the first time with this admission.  She had a visit  scheduled with me next week to establish.  She has a history of bipolar  and dementia which is not otherwise specified.  She had been staying in  a group home in [place], [place] for several years; however,  her family has moved her to [facility] in  [place] three weeks ago.  For the last 3-4 weeks, she has had daily  nausea and vomiting.  There have been no definite fevers or pain,  although she does report in the last week or two, some lower abdominal  discomfort.  She also has had possibly some suprapubic discomfort.  She  is not a very good historian.  Due to the persistence of her symptoms,  she was brought to the emergency room, where she was found to have some  degree of lithium toxicity, a urinary infection, and acute renal  insufficiency.  She will be admitted.   PAST MEDICAL HISTORY:  1. No history of heart disease, kidney disease, diabetes, or stroke.  2. History of tonsillectomy.  3. Her gait has apparently been effected by her bipolar medications,      according to the family, over the last few years, although she does      ambulate well without any assistive devices.  4. Bipolar disorder.  Originally diagnosed in 1999, although it      probably was present for some time before then.  She had      hospitalizations at [facility] two times for manic attacks and has      since been maintained on lithium and  Haldol.  5. Some degree of insulin resistance reported by the daughter.  6. Memory disorder diagnosed as dementia about a year ago, although      there have been symptoms for the last 4-5 years, most likely.  7. G2, P2 parity status.   ALLERGIES:  None.   MEDICATIONS:  1. Lithium carbonate 300 mg twice daily.  2. Haldol 2 mg daily.  3. Vitamin D 2000 IU daily.  4. Aricept 10 mg daily.  5. Enteric-coated aspirin 81 mg daily.  6. Folbalin Plus 1 tablet daily.   In the emergency room, she has been given Zofran, Zosyn, and IV fluids.   SOCIAL HISTORY:  She was divorced 43 years ago.  No tobacco.  No  alcohol.  No drug use.  She has two children, one son and one daughter.  Her daughter, [name], is at the bedside.  She lived in  [place]  originally but had stayed in the group home since 2001 in [place],  [place], [place].   FAMILY HISTORY:  Father died in his 69s of hardening of the arteries.  Mother died in her 45s of Alzheimer's.  There were six children in her  family, two sisters and three brothers.  One sister died of breast  cancer in her 25s.   REVIEW OF SYSTEMS:  Three weeks ago, she has had nausea, vomiting, and  weakness.  She did have to go out to the ER for some IV fluids in  [place] for some possible dehydration.  She does normally drink plenty  of water every day and does tend to be thirsty a lot of the time.  She  just has not felt well.  She has had a little low abdominal or pelvic  discomfort.  There has been no blood noted above or below.  She had a  little bit of a temperature one night in the last week or so.  There has  been no shortness of breath.  No change in her bowel habits, but it has  probably been a few days since she has had a bowel movement.  Most days,  she will get up and feel nauseated.  She would eat a little bit and then  throw up her breakfast.  She would normally be able to keep down about  half of her supper each day.  She  sleeps a lot, but this is normal for  her.  Again, there has been no blood noted above or below.  She appeared  a bit sallow or yellow last night, and the daughter was concerned, so  eventually she was brought to the ER today.  She denies any dysuria.   PHYSICAL EXAMINATION:  Temperature 97.8, blood pressure 128/84, pulse  68, respiratory rate 18, 98% saturation on room air.  She is pleasant and alert.  She cannot really give a history.  She  speaks out a little bit at inappropriate times with questions or  comments.  Overall, her affect seems to have a fairly normal range,  however.  She is normocephalic and atraumatic.  There is no pallor, no icterus.  LUNGS:  Clear to auscultation bilaterally.  No wheezes, rales, or  rhonchi.  HEART:  Regular rate and rhythm with no murmur, rub or gallop.  ABDOMEN:  Soft, nontender, nondistended with no mass or  hepatosplenomegaly.  There is no edema.  There are 2+ pulses throughout.   LABORATORY DATA:  White count is elevated at 11.6 with 82% segs, 12%  lymphocytes, 5% monocytes.  Hemoglobin 14.7, platelet count 382,000.  Lithium level was elevated at 1.71.  Lipase normal at 38.  Urinalysis is  cloudy with a specific gravity of 1.009, small hemoglobin, large  leukocytes, a few epithelial cells, 11-20 white cells, 7-10 red cells,  and many bacteria on microscopic exam.  Sodium 139, potassium 4,  chloride 104, CO2 26, BUN 28, creatinine 2.45, glucose 146, calcium  10.6, total protein 7.6, albumin 4.5, AST 17, ALT 19.  Again, albumin  4.5.   Acute abdominal series shows a non-acute abdominal pattern.   Chest x-ray shows some COPD changes potentially but no active disease on  the PA and lateral view.   ASSESSMENT/PLAN:  A 75 year old female with bipolar disorder and  dementia, not otherwise specified, and nausea and vomiting with apparent  lithium toxicity, acute renal failure, although I am unaware of her  baseline BUN  and creatinine, and a  urinary tract infection.  I suspect  her nausea and vomiting has been related to her lithium toxicity.  We  will admit.  We will hold her lithium for now.  We will follow lithium  levels.  We will hydrate with normal saline gently at this time.  We  will obtain a psychiatry consultation, and she may need a change in her  bipolar regimen.  We will give IV antibiotics.  I will give her Zosyn at  this time.  Cipro could cause more nausea.  Bactrim could cause electrolyte shifts; therefore, I will choose Zosyn  currently.  We will check a TSH, hemoglobin A1C, intact PTH, 25-hydroxy  D level as well.  She may need a gastroenterology evaluation if her  nausea and vomiting persist.  We will check out a renal ultrasound to  rule out an obstructive uropathy.      Mark A. Perini, M.D.  Electronically Signed     MAP/MEDQ  D:  05/17/2007  T:  05/17/2007  Job:  [id number]

## 2010-09-21 NOTE — Discharge Summary (Signed)
NAMEHadeel, [name]                  ACCOUNT NO.:  [id number]   MEDICAL RECORD NO.:  [id number]          PATIENT TYPE:  INP   LOCATION:  5507                         FACILITY:  [facility]   PHYSICIAN:  Mark A. Perini, M.D.   DATE OF BIRTH:  06/29/1935   DATE OF ADMISSION:  05/17/2007  DATE OF DISCHARGE:  05/22/2007                               DISCHARGE SUMMARY   DISCHARGE DIAGNOSES:  1. Long-standing bipolar disorder.  2. Nausea and vomiting, most likely due to Aricept or lithium.  3. Escherichia coli urinary tract infection treated this admission.  4. Renal insufficiency with some acute components, improved with      discontinuation of lithium and gentle hydration.  5. Sinus bradycardia with a left anterior fascicular block and an      incomplete right bundle branch block with heart rates that      occasionally dip into the 40s. This was completely asymptomatic;      therefore, we are taking a wait-and-see approach on this.  6. Abnormal ST-and-T wave changes on EKG which may require outpatient      followup but no ischemic cardiac symptoms noted.  7. Dementia, not otherwise specified, may need further evaluation of      this and may need a new dementia medicine added at some point,      although her nausea and vomiting did improve with the      discontinuation of Aricept.  8. Impaired fasting glucose.   PROCEDURE:  A 2-D echocardiogram which showed normal left ventricular  systolic function with an ejection fraction of 55-60% with no regional  wall motion abnormalities of the left ventricle noted. Left ventricular  wall thickness was at the upper limits of normal, and there was some  abnormal left ventricular relaxation. Aortic valve thickness was mildly  increased with trivial aortic valve regurgitation and mild mitral valve  regurgitation noted. Renal ultrasound on May 17, 2007 showed right  kidney to be 8.5 cm, left kidney to be 9.8 cm. There was cortical  atrophy of both  kidneys and increased echogenicity consistent with  chronic medical renal disease. There was no hydronephrosis. There were  some small gallstones seen. Common bile duct was not dilated.   DISCHARGE MEDICATIONS:  [name] is to discontinue lithium and discontinue  Aricept. She is to take Haldol 2 mg daily at 8:00 p.m. She is to resume  vitamin D 2000 units daily. She is to resume enteric-coated aspirin 81  mg daily and Folbalin Plus 1 tablet daily. In addition, she is to take  generic Protonix 40 mg daily with food, Colace 100 mg p.o. b.i.d.,  Depakote ER 500 mg p.o. every night and this may be titrated to 750 mg  in the next week or two depending on how she tolerates it. She is to  have MiraLax 17 g in 8 ounces of water once daily if needed for  constipation. She may have Zofran orally disintegrating tablet 8 mg  disintegrated in the mouth up to once daily if needed for nausea or  vomiting.   HISTORY OF PRESENT  ILLNESS:  [name] is a pleasant 75 year old female who  presented to the emergency room with a three-week history of persistent  nausea and vomiting on a daily basis. In the emergency room, she was  found to have renal insufficiency as well as a urinary tract infection.  Her sodium level was normal.   HOSPITAL COURSE:  [name] was admitted to a telemetry bed. Her lithium and  Aricept were discontinued, and she had no evidence of nausea and  vomiting during this hospital stay. Her BUN and creatinine gradually  improved during the course of her stay, and on May 20, 2006, her BUN  was 15, creatinine 1.8, giving her a glomerular infiltration rate of 28  mL per minute. Psychiatry was consulted and started her on Depakote 250  mg which was titrated to 500 mg each evening with a goal of 750 mg each  evening as tolerated, and her Haldol was continued. It was felt that she  should discontinue lithium at this point. She did have some  inferolateral ST and T wave changes on her EKG, and she had  some  bradycardia with occasional heart rates dipping as low as the mid to  upper 30s briefly. This was entirely asymptomatic. Cardiology was  consulted. A 2-D was performed with results as noted above. It was felt  that [name] should not have a pacemaker at this point, because she has had  no symptoms related to her bradycardia. This will be followed, and if  she has any symptoms such as syncope, she may require a pacemaker in the  future. She remained stable from a cardiovascular standpoint. Her mental  status was stable. She remained alert and responded appropriately.  However, she was never fully oriented to her situation.   DISCHARGE PHYSICAL EXAMINATION:  Temperature 98.2 - afebrile, pulse 60,  respiratory rate 18, blood pressure 95/60, 97% saturation on room air.  She was in no acute distress. She was alert and pleasant. She followed  commands.  HEART:  Was regular rate and rhythm with no murmurs, rubs, or gallops.  ABDOMEN:  Was soft and nontender with no mass or hepatosplenomegaly.  LUNGS:  Were clear to auscultation bilaterally.  There was no edema, and she moved extremities x4 symmetrically with  grossly normal strength throughout.   Notable laboratory data on May 22, 2007:  White count 6.5,  hemoglobin 12, platelet count 259,000. There were 56% segs, 33%  lymphocytes, 7% monocytes. A 25-hydroxy vitamin D level was 57 nanograms  per mL. Urine culture from May 17, 2007 grew Escherichia coli which  was pan sensitive. On May 21, 2007, sodium was 144, potassium 4.1,  chloride 109, CO2 28, BUN 15, creatinine 1.8, glucose 91, calcium 9.5.  CK was 33 with a CK-MB of 1.3 on May 19, 2007. Blood cultures x2  from May 17, 2007 remained negative at the time of discharge. Liver  function tests on May 19, 2007 were normal with the exception of a  total protein of 5.9 and an albumin of 3.2 which were slightly low.  Intact parathyroid hormone level was normal at 67 on  May 17, 2007.  TSH was normal at 2.054 on May 17, 2007. Hemoglobin A1c was 5.4% on  May 17, 2007 as well.   DISCHARGE INSTRUCTIONS:  [name] is to follow a regular diet. She is to work  with physical therapy and occupational therapy at the assisted living  facility, and we have ordered this. She is to see Dr. Joylene Draft in  two  weeks in the office, and she is to see Dr. Acie Fredrickson in three to four weeks  in his office. Furthermore, she is to keep her followup visit with Dr.  Clovis Pu per psychiatry in the community. Ms. Zappa was a full code  status during this admission. A lipid profile and CMET were still  pending at the time of discharge.      Mark A. Perini, M.D.  Electronically Signed     MAP/MEDQ  D:  05/22/2007  T:  05/22/2007  Job:  [id number]   cc:   Trula Slade., M.D.  Thayer Headings, M.D.

## 2010-09-21 NOTE — Consult Note (Signed)
NAMEKristene, [name]                  ACCOUNT NO.:  [id number]   MEDICAL RECORD NO.:  [id number]          PATIENT TYPE:  INP   LOCATION:  5507                         FACILITY:  [facility]   PHYSICIAN:  Thayer Headings, M.D. DATE OF BIRTH:  Feb 02, 1936   DATE OF CONSULTATION:  05/18/2007  DATE OF DISCHARGE:                                 CONSULTATION   [name] is a 75 year old female with a history of bipolar disease,  dementia, and renal insufficiency.  She was admitted with nausea,  vomiting, and mental status changes.   The patient has a long history of bipolar disease.  She has been managed  in [place] but recently moved to the [place] area about a month  ago.  Since that time, she has had decreased appetite, nausea, and  vomiting.  She became volume depleted and started having some spells.  These spells consisted of her not really being aware of her  surroundings.  Her daughter became concerned and brought her to the  hospital.   She was noted to be bradycardic and have some T-wave inversions.  She  denies any chest pain or dyspnea.  She denies any PND or orthopnea.   CURRENT MEDICATIONS:  1. Haldol 2 mg daily.  2. Aspirin 81 mg daily.  3. Lovenox.  4. Protonix.  5. Zosyn.  6. Zofran.  7. The patient was previously also on lithium as well as Aricept.   ALLERGIES:  None.   PAST MEDICAL HISTORY:  1. History of bipolar disease.  2. Insulin resistance.  3. Dementia.  4. Renal insufficiency.   FAMILY HISTORY:  Positive for coronary artery disease.   SOCIAL HISTORY:  The patient is a nonsmoker.  She does not drink  alcohol.   REVIEW OF SYSTEMS:  Positive for obesity and UTI.  She has nausea,  vomiting, and reduced p.o. intake.   EXAM:  She is an elderly female in no acute distress.  She is pleasant  and smiled during our visit.  Blood pressure is 128/76 with a heart rate of 52.  HEENT:  2+ carotids.  She has no JVD, no thyromegaly.  LUNGS:  Clear.  HEART:   Regular rate, S1-S2.  She is somewhat bradycardic.  ABDOMEN:  Good bowel sounds and is nontender.  EXTREMITIES:  She has no edema.   Her Chem-7 reveals a sodium of 145, potassium is 3.7, chloride 114, CO2  is 23, BUN is 23, creatinine 2.3, glucose is 97.  CPK is 41 with 2.5  MBs.  Troponin is 0.03.  Her white blood cell count is 10.2, hemoglobin  is 15.1, hematocrit is 38%.  Telemetry monitor revealed sinus  bradycardia.   Her EKG reveals sinus rhythm.  She has T-wave inversions in the inferior  and lateral leads.   Her echocardiogram reveals normal left ventricular systolic function.  Her left ventricular wall thickness is at the upper limits of normal.  She has mild mitral regurgitation.   IMPRESSION AND PLAN:  1. EKG abnormalities.  The patient at this point has negative cardiac      enzymes  and has a normal left ventricular systolic function.  At      this point, I think I would like to continue with conservative      therapy.  She really is not a good candidate for any invasive      therapy, and I think that she is best managed medically.  We will      continue to follow along with you.  I would agree with holding her      lithium and her Aricept to see if her condition improves.      Hopefully her heart rate will come up, and we will not have to      place a pacemaker.  We will follow along with you.           ______________________________  Thayer Headings, M.D.     PJN/MEDQ  D:  05/18/2007  T:  05/19/2007  Job:  [id number]   cc:   Elta Guadeloupe A. Perini, M.D.

## 2010-09-24 ENCOUNTER — Encounter (HOSPITAL_COMMUNITY): Payer: Medicare Other | Admitting: Oncology

## 2010-09-24 DIAGNOSIS — C50919 Malignant neoplasm of unspecified site of unspecified female breast: Secondary | ICD-10-CM

## 2010-09-24 NOTE — Consult Note (Signed)
NAMEMelahni, [name]                  ACCOUNT NO.:  [id number]   MEDICAL RECORD NO.:  [id number]          PATIENT TYPE:  INP   LOCATION:  5507                         FACILITY:  [facility]   PHYSICIAN:  Felizardo Hoffmann, M.D.  DATE OF BIRTH:  Apr 19, 1936   DATE OF CONSULTATION:  DATE OF DISCHARGE:  05/22/2007                                 CONSULTATION   REQUESTING PHYSICIAN:  Jerlyn Ly, MD   REASON FOR CONSULTATION:  Assess acute mental status changes, manage  psychotropic agents.   HISTORY OF PRESENT ILLNESS:  [name] is a 75 year old female admitted  to the [facility] on May 17, 2007 with nausea, vomiting, and  mental status changes.   Over the past week, the patient has developed a fluctuating  consciousness with confusion.  She also has had much difficulty with her  gait as well as some speech difficulty.   PAST PSYCHIATRIC HISTORY:  In the past medical record, the patient does  have a listing of bipolar disorder, also there is a mentioning of  dementia.   The patient is showing clouding of consciousness.  She his vaguely  oriented to person.  Her memory is poor.  Eye contact intermittent.  Affect, anxious mood and anxious speech involves confabulation.  Thought  process, confabulation.  Thought content, no evidence of hallucinations  or delusions.  No thoughts of harming herself or others.  Insight is  poor.  Judgment is impaired.  Fund of knowledge and intelligence are  below that of her estimated premorbid baseline.   The patient has been on lithium 600 mg daily with Haldol 2 mg daily for  many years.  She was admitted to [facility] twice in the past  for mania.  She has had a poor memory function over 1 year.   MEDICATIONS:  Her medications as an outpatient have been:  1. Lithium 300 mg b.i.d.  2. Haldol 2 mg daily.  3. Aricept 10 mg q.h.s.   FAMILY PSYCHIATRIC HISTORY:  The patient's mother had Alzheimer's  disease.   SOCIAL HISTORY:  Marital  status, divorced.  The patient has 2 children,  a son and a daughter.  She does not use any alcohol or illegal drugs.  Occupation, retired.  She has been in assisted living facility as of 3  weeks ago.   PAST SURGICAL HISTORY:  Includes tonsillectomy and acute delirium.   MEDICATIONS:  AMA is reviewed.  The patient is on Haldol 10 mg q.p.m.  and Ambien 5 mg q.h.s. p.r.n.   ALLERGIES:  She has no known drug allergies.   LABORATORY DATA:  The patient's lithium level on admission was 1.71,  followup lithium level on the day of consultation is 1.15, SGOT 16, and  SGPT 16.  TSH within normal limits.  Sodium 139, BUN 28, and creatinine  2.45.   WBC 11.6, hemoglobin 14.7, and platelet count 382.   REVIEW OF SYSTEMS:  Constitutional, head, eyes, ears, nose, throat,  mouth, neurologic, psychiatric, cardiovascular, respiratory,  gastrointestinal, genitourinary, skin, musculoskeletal, hematologic,  lymphatic, endocrine, and metabolic, all unremarkable.  PHYSICAL EXAMINATION:  VITAL SIGNS:  Temperature 97.7, pulse 52,  respiratory rate 18, blood pressure 131/76, and O2 saturation on room  air 100%.  GENERAL APPEARANCE:  [name] is an elderly female, reclining in a  supine position in her hospital bed with no abnormal and voluntary  movements.  She clearly is confused.   MENTAL STATUS EXAM:  The patient is showing clouding of consciousness.  She is vaguely oriented to person.  Her memory is poor.  Eye contact  intermittent.  Affect, anxious mood, and anxious speech involves  confabulation thought process, confabulation thought content, and no  evidence of hallucinations or delusions.  No thoughts of harming herself  or others.  Insight is poor.  Judgment is impaired.  Fund of knowledge  and intelligence are below that of her estimated premorbid baseline.   ASSESSMENT:  AXIS I:  294.9 unspecified persistent mental disorder not  otherwise specified rule out primary dementia,  296.80  bipolar disorder  not otherwise specified.  AXIS II:  None.  AXIS III:  Please see the past medical history.  AXIS IV:  General medical.  AXIS V:  20.   The indications, alternatives, and adverse effects of Depakote were  discussed with the son, Marya Amsler.  He understands and would like to start  Depakote as a replacement for lithium for the patient's primary mood  stabilizer.   RECOMMENDATIONS:  We would start Depakote at 250 mg p.o. or IV nightly  and then would increase as tolerated to 250 mg IV t.i.d. or if the  patient is taking p.o. 750 mg extended release p.o. nightly.   For screening for Depakote adverse effects, we would recheck a CBC and  liver function panel during the first week and periodically thereafter.   We would ensure that a complete reversible memory dysfunction etiology  workup has been done including RPR, 123456, and folic acid.   If this is negative, we would consider Aricept and/or Namenda.   Over the next 2 months, we would try to decrease the patient off Haldol.  Utilizing Haldol only if the Depakote does not prevent mania or  agitation or if the patient develops hallucinations or delusions.      Felizardo Hoffmann, M.D.  Electronically Signed    JW/MEDQ  D:  09/22/2007  T:  09/22/2007  Job:  [id number]

## 2010-10-06 ENCOUNTER — Ambulatory Visit (HOSPITAL_COMMUNITY): Payer: Medicare Other | Attending: Emergency Medicine

## 2010-10-06 ENCOUNTER — Ambulatory Visit (HOSPITAL_COMMUNITY)
Admission: EM | Admit: 2010-10-06 | Discharge: 2010-10-06 | Disposition: A | Discharge status: Home / Self Care | Payer: Medicare Other | Source: Ambulatory Visit | Attending: Emergency Medicine | Admitting: Emergency Medicine

## 2010-10-06 DIAGNOSIS — W19XXXA Unspecified fall, initial encounter: Secondary | ICD-10-CM | POA: Insufficient documentation

## 2010-10-06 DIAGNOSIS — S52539A Colles' fracture of unspecified radius, initial encounter for closed fracture: Secondary | ICD-10-CM | POA: Insufficient documentation

## 2010-10-06 DIAGNOSIS — M79609 Pain in unspecified limb: Secondary | ICD-10-CM | POA: Insufficient documentation

## 2010-10-06 DIAGNOSIS — Y9210 Unspecified residential institution as the place of occurrence of the external cause: Secondary | ICD-10-CM | POA: Insufficient documentation

## 2010-10-06 DIAGNOSIS — S52599A Other fractures of lower end of unspecified radius, initial encounter for closed fracture: Secondary | ICD-10-CM | POA: Insufficient documentation

## 2010-10-06 DIAGNOSIS — M899 Disorder of bone, unspecified: Secondary | ICD-10-CM | POA: Insufficient documentation

## 2010-10-06 DIAGNOSIS — M25539 Pain in unspecified wrist: Secondary | ICD-10-CM | POA: Insufficient documentation

## 2010-10-06 DIAGNOSIS — M949 Disorder of cartilage, unspecified: Secondary | ICD-10-CM | POA: Insufficient documentation

## 2010-10-07 ENCOUNTER — Ambulatory Visit (INDEPENDENT_AMBULATORY_CARE_PROVIDER_SITE_OTHER): Payer: Medicare Other | Admitting: Orthopedic Surgery

## 2010-10-07 ENCOUNTER — Non-Acute Institutional Stay: Payer: Self-pay | Admitting: Internal Medicine

## 2010-10-07 ENCOUNTER — Encounter: Payer: Self-pay | Admitting: Orthopedic Surgery

## 2010-10-07 ENCOUNTER — Ambulatory Visit (HOSPITAL_COMMUNITY): Payer: Medicare Other | Attending: Emergency Medicine

## 2010-10-07 ENCOUNTER — Ambulatory Visit (HOSPITAL_COMMUNITY)
Admission: EM | Admit: 2010-10-07 | Discharge: 2010-10-07 | Disposition: A | Discharge status: Home / Self Care | Payer: Medicare Other | Source: Ambulatory Visit | Attending: Emergency Medicine | Admitting: Emergency Medicine

## 2010-10-07 VITALS — HR 66 | Resp 18 | Ht 65.0 in | Wt 143.0 lb

## 2010-10-07 DIAGNOSIS — F0390 Unspecified dementia without behavioral disturbance: Secondary | ICD-10-CM | POA: Insufficient documentation

## 2010-10-07 DIAGNOSIS — W19XXXA Unspecified fall, initial encounter: Secondary | ICD-10-CM | POA: Insufficient documentation

## 2010-10-07 DIAGNOSIS — IMO0002 Reserved for concepts with insufficient information to code with codable children: Secondary | ICD-10-CM | POA: Insufficient documentation

## 2010-10-07 DIAGNOSIS — S62109A Fracture of unspecified carpal bone, unspecified wrist, initial encounter for closed fracture: Secondary | ICD-10-CM | POA: Insufficient documentation

## 2010-10-07 DIAGNOSIS — M25529 Pain in unspecified elbow: Secondary | ICD-10-CM | POA: Insufficient documentation

## 2010-10-07 DIAGNOSIS — M7989 Other specified soft tissue disorders: Secondary | ICD-10-CM | POA: Insufficient documentation

## 2010-10-07 DIAGNOSIS — S51009A Unspecified open wound of unspecified elbow, initial encounter: Secondary | ICD-10-CM | POA: Insufficient documentation

## 2010-10-07 DIAGNOSIS — Y9210 Unspecified residential institution as the place of occurrence of the external cause: Secondary | ICD-10-CM | POA: Insufficient documentation

## 2010-10-07 NOTE — Patient Instructions (Signed)
Keep  Cast dry   Do not get wet   If it gets wet dry with a hair dryer on low setting and call the office   

## 2010-10-07 NOTE — Progress Notes (Signed)
This is a new patient  Chief complaint pain RIGHT wrist.  She is 75 years old. She lives at the [facility] . She fell going to the bathroom. Date of injury May 30.  Emergency room visit today as well.  X-rays show nondisplaced impacted intra-articular fracture with no angulation. Distal radius.  Pain is 9/10, sharp, constant. Swelling is mild.  The patient denies anorexia, fever, weight loss,, vision loss, decreased hearing, hoarseness, chest pain, syncope, dyspnea on exertion, peripheral edema, balance deficits, hemoptysis, abdominal pain, melena, hematochezia, severe indigestion/heartburn, hematuria, incontinence, genital sores, muscle weakness, suspicious skin lesions, transient blindness, difficulty walking, depression, unusual weight change, abnormal bleeding, enlarged lymph nodes, angioedema, and breast masses.  History   Social History  . Marital Status: Divorced    Spouse Name: N/A    Number of Children: N/A  . Years of Education: college   Occupational History  . retired    Social History Main Topics  . Smoking status: Never Smoker   . Smokeless tobacco: Not on file  . Alcohol Use: No  . Drug Use: No  . Sexually Active: Not on file   Other Topics Concern  . Not on file   Social History Narrative  . No narrative on file    General: The patient is normally developed, with normal grooming and hygiene. There are no gross deformities. The body habitus is normal   CDV: The pulse and perfusion of the extremities are normal   LYMPH: There is no gross lymphadenopathy in the extremities   Skin: There are no rashes, ulcers or cafe-au-lait spot   Psyche: The patient is alert, awake and oriented.  Mood is normal   Neuro:  The coordination and balance are normal.  Sensation is normal. Reflexes are 2+ and equal   Musculoskeletal  RIGHT wrist is swollen and tender. No deformity is seen. Painful range of motion of the wrist is noted. Active range of motion limited by pain.  Muscle tone is normal.  Instability could not be assessed.  No other injuries are noted. Upper extremities otherwise appear normal. At the elbow or shoulder on the RIGHT and elbow, shoulder, wrist, and hand on the LEFT.  X-rays reviewed personally.  Diagnosis closed RIGHT distal radius fracture/wrist fracture.  Plan application short arm cast x1 month.  Come back for x-rays out of plaster

## 2010-10-26 ENCOUNTER — Encounter (INDEPENDENT_AMBULATORY_CARE_PROVIDER_SITE_OTHER): Payer: Self-pay | Admitting: General Surgery

## 2010-11-09 ENCOUNTER — Ambulatory Visit (INDEPENDENT_AMBULATORY_CARE_PROVIDER_SITE_OTHER): Payer: Medicare Other | Admitting: Orthopedic Surgery

## 2010-11-09 DIAGNOSIS — S62109A Fracture of unspecified carpal bone, unspecified wrist, initial encounter for closed fracture: Secondary | ICD-10-CM

## 2010-11-09 NOTE — Patient Instructions (Signed)
Brace x 4 weeks  Remove for bathing

## 2010-11-09 NOTE — Progress Notes (Signed)
Fracture one month status post cast RIGHT distal radius fracture with x-ray scheduled for today.  She still has some soreness and tenderness at the wrist. Overall alignment is normal.  X-rays show some settling of the fracture. There is some comminution, but the joint space is well preserved. The overall dorsal tilt is approximately neutral.  Recommend brace x1 month and recheck

## 2010-11-09 NOTE — Progress Notes (Signed)
X-ray report, RIGHT wrist.  3 views  Diagnosis RIGHT wrist fracture.  The overall alignment of the wrist is normal.  There is some comminution of the fracture, but it is healing nicely.  There is neutral tilt to the articular surface. There is some decrease in the radial inclination angle. There is some settling of the fracture.  Overall healing distal radius fracture.

## 2010-11-11 ENCOUNTER — Telehealth: Payer: Self-pay | Admitting: Radiology

## 2010-11-11 NOTE — Telephone Encounter (Signed)
Therapy Dept. Called asking instructions for patient's therapy since she has her cast off. Please advise. [phone].

## 2010-11-18 ENCOUNTER — Inpatient Hospital Stay
Admission: RE | Admit: 2010-11-18 | Discharge: 2013-03-14 | Disposition: A | Discharge status: Nursing Home (Includes ICF) | Payer: Medicaid Other | Source: Ambulatory Visit | Attending: Internal Medicine | Admitting: Internal Medicine

## 2010-11-18 ENCOUNTER — Other Ambulatory Visit (INDEPENDENT_AMBULATORY_CARE_PROVIDER_SITE_OTHER): Payer: Self-pay | Admitting: General Surgery

## 2010-11-18 ENCOUNTER — Ambulatory Visit (HOSPITAL_COMMUNITY)
Admission: RE | Admit: 2010-11-18 | Discharge: 2010-11-18 | Disposition: A | Discharge status: Home / Self Care | Payer: Medicare Other | Source: Ambulatory Visit | Attending: General Surgery | Admitting: General Surgery

## 2010-11-18 ENCOUNTER — Inpatient Hospital Stay (HOSPITAL_COMMUNITY): Payer: Medicare Other | Attending: General Surgery

## 2010-11-18 DIAGNOSIS — R0989 Other specified symptoms and signs involving the circulatory and respiratory systems: Secondary | ICD-10-CM

## 2010-11-18 DIAGNOSIS — C50919 Malignant neoplasm of unspecified site of unspecified female breast: Secondary | ICD-10-CM

## 2010-11-18 DIAGNOSIS — Z01818 Encounter for other preprocedural examination: Secondary | ICD-10-CM | POA: Insufficient documentation

## 2010-11-18 DIAGNOSIS — T148XXA Other injury of unspecified body region, initial encounter: Principal | ICD-10-CM

## 2010-11-18 DIAGNOSIS — Z79899 Other long term (current) drug therapy: Secondary | ICD-10-CM | POA: Insufficient documentation

## 2010-11-18 DIAGNOSIS — F0390 Unspecified dementia without behavioral disturbance: Secondary | ICD-10-CM | POA: Insufficient documentation

## 2010-11-18 DIAGNOSIS — F319 Bipolar disorder, unspecified: Secondary | ICD-10-CM | POA: Insufficient documentation

## 2010-11-18 LAB — PROTIME-INR
INR: 0.94 (ref 0.00–1.49)
Prothrombin Time: 12.8 seconds (ref 11.6–15.2)

## 2010-11-18 LAB — COMPREHENSIVE METABOLIC PANEL
ALT: 10 U/L (ref 0–35)
AST: 13 U/L (ref 0–37)
Albumin: 3.9 g/dL (ref 3.5–5.2)
CO2: 28 mEq/L (ref 19–32)
Calcium: 11.1 mg/dL — ABNORMAL HIGH (ref 8.4–10.5)
Creatinine, Ser: 1.71 mg/dL — ABNORMAL HIGH (ref 0.50–1.10)
GFR calc non Af Amer: 29 mL/min — ABNORMAL LOW (ref >60)
Sodium: 145 mEq/L (ref 135–145)

## 2010-11-18 LAB — DIFFERENTIAL
Basophils Absolute: 0 10*3/uL (ref 0.0–0.1)
Basophils Relative: 1 % (ref 0–1)
Eosinophils Relative: 2 % (ref 0–5)
Lymphocytes Relative: 30 % (ref 12–46)
Neutro Abs: 3.1 10*3/uL (ref 1.7–7.7)

## 2010-11-18 LAB — SURGICAL PCR SCREEN: Staphylococcus aureus: NEGATIVE

## 2010-11-18 LAB — CBC
HCT: 38.1 % (ref 36.0–46.0)
Hemoglobin: 12.2 g/dL (ref 12.0–15.0)
RDW: 13.4 % (ref 11.5–15.5)
WBC: 5.5 10*3/uL (ref 4.0–10.5)

## 2010-11-22 ENCOUNTER — Telehealth (INDEPENDENT_AMBULATORY_CARE_PROVIDER_SITE_OTHER): Payer: Self-pay

## 2010-11-22 NOTE — Telephone Encounter (Signed)
[name], daughter of Branigan Haneline called and said [facility] wanted a follow up apointment for Mrs [name] within a week. I told patient he is out of office this the rest of this week and his schedule was full next week. Can you see if you can find time to squeeze her in.

## 2010-11-22 NOTE — Telephone Encounter (Signed)
Spoke with the pt's daughter and gave her pathology results. Will contact her later today to schedule a follow up appt w/ Dr. Zella Richer.

## 2010-11-23 NOTE — Op Note (Signed)
  [name], [name]                ACCOUNT NO.:  [id number]  MEDICAL RECORD NO.:  [id number]  LOCATION:  [facility]                         FACILITY:  [facility]  PHYSICIAN:  Odis Hollingshead, M.D.DATE OF BIRTH:  November 09, 1935  DATE OF PROCEDURE:  11/18/2010 DATE OF DISCHARGE:                              OPERATIVE REPORT   PREOPERATIVE DIAGNOSIS:  Right breast cancer.  POSTOPERATIVE DIAGNOSIS:  Right breast cancer.  PROCEDURE:  Right partial mastectomy.  SURGEON:  Odis Hollingshead, MD  ANESTHESIA:  General/LMA with Marcaine local.  INDICATIONS:  [name] is a 75 year old female with dementia and bipolar disorder.  She had a right breast cancer diagnosed back in 2011.  She was tried on aromatase inhibitor to try to shrink the cancer, but actually is getting larger.  Because of this, she now presents for a lumpectomy.  I had a long discussion with her and her daughter about this.  We talked about the potential for bleeding, infection, potential positive margins, and breast deformity and they understand this.  TECHNIQUE:  She was seen in the holding area and right breast marked with my initials.  She was then brought to the operating room, placed supine on the operating table, and given a general anesthesia by way of LMA .  The right breast and lateral chest wall sterilely prepped and draped.  The tumor was involving part of the skin.  I made an elliptical incision around the skin and the tumor tenting up the skin and then carefully dissected the skin and subcutaneous let free from this area using the scalpel.  Using electrocautery, I then performed a partial mastectomy of the upper outer quadrant, staying close to the skin and trying to include normal tissue around the tumor.  The tumor went all the way down and was adherent to the chest wall.  I took the fascia off the underlying pectoralis muscle, but did not remove any muscle up.  I then excised the tumor.  I marked the medial  skin with this single suture. The skin was actually the anterior margin.  I then marked the superior margin with a double stitch.  This was sent to Pathology for full evaluation.  Following this, I controlled bleeding with sutures and with electrocautery.  I irrigated out the wound.  I then anesthetized the wound with Marcaine solution.  Further inspection demonstrated adequate hemostasis.  I then reapproximated some of the deep and superficial subcutaneous tissue with interrupted 3-0 Vicryl sutures.  The skin was closed with a 3-0 Monocryl subcuticular stitch.  Steri-Strips and sterile dressings were applied.  She tolerated the procedure well without any apparent complications and was taken to the recovery room in satisfactory condition.     Odis Hollingshead, M.D.     [name]  D:  11/18/2010  T:  11/18/2010  Job:  [id number]  cc:   Gaston Islam. Tressie Stalker, MD Nobie Putnam, M.D.  Electronically Signed by Jackolyn Confer M.D. on 11/23/2010 09:51:51 AM

## 2010-11-23 NOTE — Telephone Encounter (Signed)
OT orders   Hand and wrist ROM exercises include grip strength   Daily x 4 weeks   [name]

## 2010-12-09 ENCOUNTER — Ambulatory Visit (INDEPENDENT_AMBULATORY_CARE_PROVIDER_SITE_OTHER): Payer: Medicare Other | Admitting: Orthopedic Surgery

## 2010-12-09 DIAGNOSIS — S62109A Fracture of unspecified carpal bone, unspecified wrist, initial encounter for closed fracture: Secondary | ICD-10-CM

## 2010-12-09 NOTE — Progress Notes (Signed)
RIGHT this fracture on May 30 treated with a cast.  Recently, had the cast removed, placed in a brace, and now having physical therapy.  Patient's history is, primarily given by her daughter, but she does note that the wrist has been having some swelling at stiff.  We reviewed the x-rays with the daughter and explained to her that the fracture has settled and she has some ulnar positive variance otherwise nothing of any concern.  Discharge.

## 2010-12-10 ENCOUNTER — Ambulatory Visit (INDEPENDENT_AMBULATORY_CARE_PROVIDER_SITE_OTHER): Payer: Medicare Other | Admitting: General Surgery

## 2010-12-10 DIAGNOSIS — C50919 Malignant neoplasm of unspecified site of unspecified female breast: Secondary | ICD-10-CM

## 2010-12-10 NOTE — Progress Notes (Signed)
Operation:  Right partial mastectomy.  Date:  November 18, 2010.  Pathology:  T3Nx, ER/PR positive.  HPI:  She is here for further postoperative visit. She feels good and has no complaints.   Physical Exam: Right breast-upper outer quadrant incision clean/dry/intact.   Assessment:  Doing well postoperatively.  Plan:  We'll give them a copy of her pathology report.  Return visit one month.

## 2010-12-24 ENCOUNTER — Encounter (HOSPITAL_COMMUNITY): Payer: Self-pay | Admitting: Oncology

## 2010-12-24 ENCOUNTER — Encounter (HOSPITAL_COMMUNITY): Payer: Medicare Other | Attending: Oncology | Admitting: Oncology

## 2010-12-24 VITALS — BP 133/82 | HR 52 | Temp 98.8°F | Wt 150.4 lb

## 2010-12-24 DIAGNOSIS — Z170 Estrogen receptor positive status [ER+]: Secondary | ICD-10-CM

## 2010-12-24 DIAGNOSIS — C50919 Malignant neoplasm of unspecified site of unspecified female breast: Secondary | ICD-10-CM

## 2010-12-24 NOTE — Progress Notes (Signed)
This office note has been dictated.

## 2010-12-24 NOTE — Patient Instructions (Signed)
[facility]  Discharge Instructions  RECOMMENDATIONS MADE BY THE CONSULTANT AND ANY TEST RESULTS WILL BE SENT TO YOUR REFERRING DOCTOR.   EXAM FINDINGS BY MD TODAY AND SIGNS AND SYMPTOMS TO REPORT TO CLINIC OR PRIMARY MD:  Follow up in 2 months.   I acknowledge that I have been informed and understand all the instructions given to me and received a copy. I do not have any more questions at this time, but understand that I may call the Specialty Clinic at [facility] at [phone] during business hours should I have any further questions or need assistance in obtaining follow-up care.    __________________________________________  _____________  __________ Signature of Patient or Authorized Representative            Date                   Time    __________________________________________ Nurse's Signature

## 2010-12-24 NOTE — Progress Notes (Signed)
Her doctor is Dr. Jackolyn Confer.  DIAGNOSES: 1. Breast cancer, right-sided, with progression on Arimidex. 2. Dementia. 3. History of nausea and vomiting. 4. History of weight loss in the past. 5. History of wrist fracture in the past.  This a very pleasant lady who Dr. Zella Richer was kind enough to see for me who has a breast mass on the right which clearly progressed clinically and radiologically.  We felt it was best to have this thing removed and she underwent an essentially wide excision/lumpectomy. Margins were clear but very close but she had a 5.5-cm area of invasion, probably slightly larger than we anticipated along with some high-grade DCIS.  The cancer in the past was ER/PR positive but HER-2/neu negative.  She had obviously progressed on Arimidex and so after the surgery, it will probably be wise to treat her with tamoxifen.  I could consider Aromasin but it is an aromatase inhibitor with the same mechanism of action and therefore I opted to go with tamoxifen which is a different mechanism of action essentially.  Radiation might be worth considering at this point, but it is difficult for her to be still and her daughter and I talked about that, so we will just watch her for right now and put that on hold.  Her vital signs today are very stable.  She looks good.  The wound has healed well.  She has no nodes in the supraclavicular, infraclavicular or axillary area on the right and again the incision looks very, very good.  There is no sign of infection.  There is a pinkness at best to the skin around the incision but this is not of concern.  I did not feel any residual mass in the area.  She will see Dr. Zella Richer in followup.  We are going to start tamoxifen today.  We will stop the Arimidex and we will see her back in 8 weeks and see where we stand at that time.    ______________________________ Gaston Islam. Tressie Stalker, MD ESN/MEDQ  D:  12/24/2010  T:  12/24/2010   Job:  [id number]

## 2010-12-30 ENCOUNTER — Ambulatory Visit (INDEPENDENT_AMBULATORY_CARE_PROVIDER_SITE_OTHER): Payer: Medicare Other | Admitting: Orthopedic Surgery

## 2010-12-30 DIAGNOSIS — S62109A Fracture of unspecified carpal bone, unspecified wrist, initial encounter for closed fracture: Secondary | ICD-10-CM

## 2010-12-30 NOTE — Progress Notes (Signed)
Fracture fifth followup  Fracture treated with cast  Patient has residual wrist pain with joint motion  Minimal deformity is noted at the distal radial ulnar area secondary to ulnar positive variance  The lymph node involvement to palpation.  No redness/streaking. Unclear etiology of current pain other than the residual deformity of the fracture  Recommend Ultram twice a day at 10 AM and 4 PM  Followup as needed

## 2011-01-24 ENCOUNTER — Encounter (INDEPENDENT_AMBULATORY_CARE_PROVIDER_SITE_OTHER): Payer: Self-pay | Admitting: General Surgery

## 2011-01-24 ENCOUNTER — Ambulatory Visit (INDEPENDENT_AMBULATORY_CARE_PROVIDER_SITE_OTHER): Payer: Medicare Other | Admitting: General Surgery

## 2011-01-24 VITALS — BP 128/86 | HR 60

## 2011-01-24 DIAGNOSIS — C50919 Malignant neoplasm of unspecified site of unspecified female breast: Secondary | ICD-10-CM

## 2011-01-24 NOTE — Progress Notes (Signed)
Operation:  Right partial mastectomy.  Date:  November 18, 2010.  Pathology:  T3Nx, ER/PR positive.  HPI:  She is here for another postoperative visit. She feels good and has no complaints.  She is able to move her right arm freely.   Physical Exam: Right breast-upper outer quadrant incision clean/dry/intact.  One steri strip was removed.   Assessment:  Continues to heal  well postoperatively.  Plan:  Activities as tolerated.  Return visit three months.

## 2011-01-27 LAB — DIFFERENTIAL
Basophils Absolute: 0
Basophils Absolute: 0.1
Basophils Relative: 0
Basophils Relative: 1
Eosinophils Absolute: 0.2
Eosinophils Relative: 1
Eosinophils Relative: 2
Lymphocytes Relative: 12
Lymphs Abs: 1.4
Lymphs Abs: 2.2
Monocytes Absolute: 0.5
Monocytes Absolute: 0.7
Monocytes Relative: 5
Monocytes Relative: 7
Monocytes Relative: 7
Neutro Abs: 3.6
Neutro Abs: 7.1
Neutro Abs: 9.5 — ABNORMAL HIGH
Neutrophils Relative %: 56
Neutrophils Relative %: 82 — ABNORMAL HIGH

## 2011-01-27 LAB — CBC
HCT: 31.1 — ABNORMAL LOW
HCT: 34 — ABNORMAL LOW
HCT: 38
HCT: 43
Hemoglobin: 13.1
Hemoglobin: 14.7
MCHC: 33.6
MCHC: 34.1
MCHC: 34.2
MCHC: 34.5
MCV: 88.8
MCV: 90.9
MCV: 91.1
MCV: 91.5
MCV: 91.8
Platelets: 230
Platelets: 248
Platelets: 382
RBC: 3.74 — ABNORMAL LOW
RBC: 3.93
RBC: 4.23
RBC: 4.84
RDW: 12.9
RDW: 12.9
RDW: 13.3
WBC: 11.6 — ABNORMAL HIGH
WBC: 6.5
WBC: 6.7
WBC: 7.6
WBC: 8.1

## 2011-01-27 LAB — COMPREHENSIVE METABOLIC PANEL WITH GFR
ALT: 19
AST: 17
Albumin: 4.5
Alkaline Phosphatase: 72
BUN: 28 — ABNORMAL HIGH
CO2: 26
Calcium: 10.6 — ABNORMAL HIGH
Chloride: 104
Creatinine, Ser: 2.45 — ABNORMAL HIGH
GFR calc non Af Amer: 20 — ABNORMAL LOW
Glucose, Bld: 146 — ABNORMAL HIGH
Potassium: 4
Sodium: 139
Total Bilirubin: 0.7
Total Protein: 7.6

## 2011-01-27 LAB — BASIC METABOLIC PANEL
BUN: 15
BUN: 18
CO2: 23
CO2: 23
Calcium: 9.5
Calcium: 9.6
Calcium: 9.8
Chloride: 106
Chloride: 109
Chloride: 114 — ABNORMAL HIGH
Chloride: 116 — ABNORMAL HIGH
Creatinine, Ser: 1.76 — ABNORMAL HIGH
Creatinine, Ser: 1.8 — ABNORMAL HIGH
GFR calc Af Amer: 25 — ABNORMAL LOW
GFR calc Af Amer: 35 — ABNORMAL LOW
Glucose, Bld: 88
Glucose, Bld: 97
Potassium: 3.7
Potassium: 4.1
Sodium: 145

## 2011-01-27 LAB — HEPATIC FUNCTION PANEL
ALT: 12
ALT: 16
AST: 16
AST: 36
Albumin: 3.5
Alkaline Phosphatase: 52
Alkaline Phosphatase: 52
Bilirubin, Direct: 0.1
Bilirubin, Direct: 0.1
Indirect Bilirubin: 0.5
Indirect Bilirubin: 0.9
Total Bilirubin: 0.3
Total Bilirubin: 0.6
Total Bilirubin: 1

## 2011-01-27 LAB — LITHIUM LEVEL: Lithium Lvl: 1.71 — ABNORMAL HIGH

## 2011-01-27 LAB — CULTURE, BLOOD (ROUTINE X 2)
Culture: NO GROWTH
Culture: NO GROWTH

## 2011-01-27 LAB — PHOSPHORUS: Phosphorus: 3.4

## 2011-01-27 LAB — CK TOTAL AND CKMB (NOT AT ARMC)
Relative Index: INVALID
Relative Index: INVALID

## 2011-01-27 LAB — URINALYSIS, ROUTINE W REFLEX MICROSCOPIC
Bilirubin Urine: NEGATIVE
Nitrite: NEGATIVE
Protein, ur: NEGATIVE
Specific Gravity, Urine: 1.009
Urobilinogen, UA: 0.2

## 2011-01-27 LAB — MAGNESIUM: Magnesium: 2.5

## 2011-01-27 LAB — LIPID PANEL
Total CHOL/HDL Ratio: 4.7
VLDL: 42 — ABNORMAL HIGH

## 2011-01-27 LAB — URINE CULTURE: Colony Count: 9000

## 2011-01-27 LAB — LIPASE, BLOOD: Lipase: 38

## 2011-01-27 LAB — URINE MICROSCOPIC-ADD ON

## 2011-01-27 LAB — TSH: TSH: 2.054

## 2011-02-16 ENCOUNTER — Encounter (HOSPITAL_COMMUNITY): Payer: Medicare Other | Attending: Oncology | Admitting: Oncology

## 2011-02-16 VITALS — BP 141/78 | HR 52 | Temp 98.1°F | Wt 150.2 lb

## 2011-02-16 DIAGNOSIS — C50419 Malignant neoplasm of upper-outer quadrant of unspecified female breast: Secondary | ICD-10-CM

## 2011-02-16 DIAGNOSIS — M949 Disorder of cartilage, unspecified: Secondary | ICD-10-CM

## 2011-02-16 DIAGNOSIS — C50919 Malignant neoplasm of unspecified site of unspecified female breast: Secondary | ICD-10-CM | POA: Insufficient documentation

## 2011-02-16 LAB — COMPREHENSIVE METABOLIC PANEL
ALT: 9 U/L (ref 0–35)
AST: 13 U/L (ref 0–37)
Albumin: 3.6 g/dL (ref 3.5–5.2)
Alkaline Phosphatase: 70 U/L (ref 39–117)
BUN: 25 mg/dL — ABNORMAL HIGH (ref 6–23)
CO2: 27 mEq/L (ref 19–32)
Calcium: 9.2 mg/dL (ref 8.4–10.5)
Chloride: 105 mEq/L (ref 96–112)
Creatinine, Ser: 1.53 mg/dL — ABNORMAL HIGH (ref 0.50–1.10)
GFR calc Af Amer: 38 mL/min — ABNORMAL LOW (ref >90)
GFR calc non Af Amer: 33 mL/min — ABNORMAL LOW (ref >90)
Glucose, Bld: 90 mg/dL (ref 70–99)
Potassium: 4.3 mEq/L (ref 3.5–5.1)
Sodium: 141 mEq/L (ref 135–145)
Total Bilirubin: 0.2 mg/dL — ABNORMAL LOW (ref 0.3–1.2)
Total Protein: 7 g/dL (ref 6.0–8.3)

## 2011-02-16 LAB — CBC
MCH: 28.6 pg (ref 26.0–34.0)
Platelets: 199 10*3/uL (ref 150–400)
RBC: 4.2 MIL/uL (ref 3.87–5.11)
WBC: 5.1 10*3/uL (ref 4.0–10.5)

## 2011-02-16 NOTE — Progress Notes (Signed)
This office note has been dictated.

## 2011-02-16 NOTE — Patient Instructions (Signed)
[facility]  Discharge Instructions  RECOMMENDATIONS MADE BY THE CONSULTANT AND ANY TEST RESULTS WILL BE SENT TO YOUR REFERRING DOCTOR.   EXAM FINDINGS BY MD TODAY AND SIGNS AND SYMPTOMS TO REPORT TO CLINIC OR PRIMARY MD: doing well.  Breast area looks great.  MEDICATIONS PRESCRIBED: continue tamoxifen   INSTRUCTIONS GIVEN AND DISCUSSED: Report any new lumps, bone pain or shortness of breath.  SPECIAL INSTRUCTIONS/FOLLOW-UP: Return to Clinic in 3 months.   I acknowledge that I have been informed and understand all the instructions given to me and received a copy. I do not have any more questions at this time, but understand that I may call the Specialty Clinic at [facility] at [phone] during business hours should I have any further questions or need assistance in obtaining follow-up care.    __________________________________________  _____________  __________ Signature of Patient or Authorized Representative            Date                   Time    __________________________________________ Nurse's Signature

## 2011-02-16 NOTE — Progress Notes (Signed)
CC:   [name], M.D. [name], M.D.  DIAGNOSES: 1. Right-sided breast cancer, upper outer quadrant, with progression     on Arimidex now status post a limited mastectomy by Dr. Zella Thompson     are which took place in July 2012.  We also switched her to     tamoxifen at that time. 2. Bipolar disorder. 3. Dementia. 4. Drug allergies including Aricept and lithium. 5. Osteoporosis treated in the past with Fosamax, calcium, vitamin D     and we have continued the calcium and vitamin D but the Fosamax may     not have been well tolerated. 6. Gastrointestinal upset causing nausea and vomiting for several     months with resolution after being started on Reglan and Zofran. The tamoxifen seems to be tolerated really quite well.  She is still at the [facility] where she is getting along fine.  Her daughter [name] is with her today.  [name] is not aware of any side effects from the tamoxifen.  She is a little tender still in the surgical bed and that is about all on review of systems  PHYSICAL EXAMINATION:  Her weight is stable.  Other vital signs are stable.  She is in no acute distress.  Pulse right around 52-54 and regular.  She has no lymphadenopathy in the cervical, supraclavicular, infraclavicular, axillary or inguinal areas.  The right breast has healed very nicely.  No masses are detected on the right.  Her heart shows a regular rhythm, rate right around 54.  No S3 gallop.  Abdomen is soft, nontender.  She has no peripheral edema of the arms or legs.  I think she looks great.  We will continue the tamoxifen.  Will see her back in 12 more weeks.  Will get some blood work today and again probably in 12 weeks.  We will see what her cancer marker has done.  It had been rising.  Will see what it is at this juncture.    ______________________________ [name]. April Stalker, MD ESN/MEDQ  D:  02/16/2011  T:  02/16/2011  Job:  [id number]

## 2011-04-27 ENCOUNTER — Encounter (INDEPENDENT_AMBULATORY_CARE_PROVIDER_SITE_OTHER): Payer: Medicare Other | Admitting: General Surgery

## 2011-05-23 ENCOUNTER — Encounter (HOSPITAL_COMMUNITY): Payer: Self-pay | Admitting: Oncology

## 2011-05-23 ENCOUNTER — Encounter (HOSPITAL_COMMUNITY): Payer: Medicare Other | Attending: Oncology | Admitting: Oncology

## 2011-05-23 VITALS — BP 140/84 | HR 61 | Temp 98.4°F | Wt 146.0 lb

## 2011-05-23 DIAGNOSIS — C50419 Malignant neoplasm of upper-outer quadrant of unspecified female breast: Secondary | ICD-10-CM

## 2011-05-23 DIAGNOSIS — F319 Bipolar disorder, unspecified: Secondary | ICD-10-CM

## 2011-05-23 DIAGNOSIS — C50919 Malignant neoplasm of unspecified site of unspecified female breast: Secondary | ICD-10-CM | POA: Diagnosis not present

## 2011-05-23 DIAGNOSIS — R110 Nausea: Secondary | ICD-10-CM

## 2011-05-23 DIAGNOSIS — K1370 Unspecified lesions of oral mucosa: Secondary | ICD-10-CM | POA: Diagnosis not present

## 2011-05-23 LAB — COMPREHENSIVE METABOLIC PANEL
AST: 13 U/L (ref 0–37)
BUN: 19 mg/dL (ref 6–23)
CO2: 25 mEq/L (ref 19–32)
Calcium: 9.7 mg/dL (ref 8.4–10.5)
Creatinine, Ser: 1.54 mg/dL — ABNORMAL HIGH (ref 0.50–1.10)
GFR calc Af Amer: 37 mL/min — ABNORMAL LOW (ref >90)
GFR calc non Af Amer: 32 mL/min — ABNORMAL LOW (ref >90)
Glucose, Bld: 101 mg/dL — ABNORMAL HIGH (ref 70–99)

## 2011-05-23 LAB — CBC
HCT: 38.3 % (ref 36.0–46.0)
MCH: 29 pg (ref 26.0–34.0)
MCV: 91.8 fL (ref 78.0–100.0)
Platelets: 206 10*3/uL (ref 150–400)
RBC: 4.17 MIL/uL (ref 3.87–5.11)

## 2011-05-23 NOTE — Patient Instructions (Signed)
[name]  [id number] [birth year]    [facility]  Discharge Instructions  RECOMMENDATIONS MADE BY THE CONSULTANT AND ANY TEST RESULTS WILL BE SENT TO YOUR REFERRING DOCTOR.   EXAM FINDINGS BY MD TODAY AND SIGNS AND SYMPTOMS TO REPORT TO CLINIC OR PRIMARY MD: You are doing well, no evidence of recurrence by exam.  Will continue Tamoxifen.  MEDICATIONS PRESCRIBED: None   INSTRUCTIONS GIVEN AND DISCUSSED: Other :  Report any new lumps,bone pain or shortness of breath.  SPECIAL INSTRUCTIONS/FOLLOW-UP: Lab work Needed done today and Return to Clinic in 3 months.   I acknowledge that I have been informed and understand all the instructions given to me and received a copy. I do not have any more questions at this time, but understand that I may call the Specialty Clinic at [facility] at [phone] during business hours should I have any further questions or need assistance in obtaining follow-up care.    __________________________________________  _____________  __________ Signature of Patient or Authorized Representative            Date                   Time    __________________________________________ Nurse's Signature

## 2011-05-23 NOTE — Progress Notes (Signed)
This office note has been dictated.

## 2011-05-23 NOTE — Progress Notes (Signed)
CC:   [name], M.D. [name], M.D. [name], M.D.  DIAGNOSES: 1. Right-sided breast cancer upper outer quadrant with progression on     Arimidex, status post a limited segmental mastectomy by Dr.     Zella Richer in July 2012 at which time we switched her to tamoxifen     10 mg b.i.d. 2. Bipolar disorder. 3. Dementia. 4. Allergies to Aricept and lithium. 5. Osteoporosis treated in the past with Fosamax, calcium and vitamin     D. 6. Gastrointestinal upset occasionally causing nausea and vomiting. 7. Chronic constipation intermittently. [name] is still at [facility].  Her daughter [name] who works here is with her today.  [name] has had a little bit of nausea recurrence and she is back on some Phenergan.  She has lost a few pounds as well, but not a significant amount.  Her weight was 150 in October, 146 today, but it was 142 in May of this year, so she is pretty stable.  She complains of a sore mouth.  The buccal mucosa on the right is sore. She also looks like she has got gum disease and just some irritation of the buccal mucosa that is nonspecific, but she has very bad teeth and gums at the base of the teeth and she will see a dentist tomorrow.  Other than that, she does not have complaints around pain of her abdomen or breast area, etc.  PHYSICAL EXAMINATION:  Her vital signs are all stable.  Lymph nodes: Negative throughout.  The right breast shows the surgical scars, but no obvious recurrent disease or mass.  The left breast is also negative. Her lungs are clear.  Heart shows a regular rhythm and rate without obvious gallop or murmur.  Abdomen is soft, nontender, nondistended.  It was a little tender when I first examined her lower abdomen, but that disappeared with repeat exam.  Bowel sounds were very quiet.  She has no peripheral edema of the arms or legs.  Will continue the tamoxifen.  Will get some lab work again since she did have  an elevated CA 27.29 of 74 back in May.  Most recently, when we checked it, her cancer marker was 33.  That was in October, so it has been a good marker for [place].  Again will see her in 3 months.  She will continue the tamoxifen.    ______________________________ Gaston Islam. Tressie Stalker, MD ESN/MEDQ  D:  05/23/2011  T:  05/23/2011  Job:  [id number]

## 2011-05-27 DIAGNOSIS — R1115 Cyclical vomiting syndrome unrelated to migraine: Secondary | ICD-10-CM | POA: Diagnosis not present

## 2011-05-28 DIAGNOSIS — N390 Urinary tract infection, site not specified: Secondary | ICD-10-CM | POA: Diagnosis not present

## 2011-05-30 DIAGNOSIS — I10 Essential (primary) hypertension: Secondary | ICD-10-CM | POA: Diagnosis not present

## 2011-05-30 DIAGNOSIS — K769 Liver disease, unspecified: Secondary | ICD-10-CM | POA: Diagnosis not present

## 2011-05-30 DIAGNOSIS — D649 Anemia, unspecified: Secondary | ICD-10-CM | POA: Diagnosis not present

## 2011-05-30 DIAGNOSIS — E119 Type 2 diabetes mellitus without complications: Secondary | ICD-10-CM | POA: Diagnosis not present

## 2011-06-02 ENCOUNTER — Encounter (INDEPENDENT_AMBULATORY_CARE_PROVIDER_SITE_OTHER): Payer: Self-pay | Admitting: General Surgery

## 2011-06-02 DIAGNOSIS — E870 Hyperosmolality and hypernatremia: Secondary | ICD-10-CM | POA: Diagnosis not present

## 2011-06-06 DIAGNOSIS — I10 Essential (primary) hypertension: Secondary | ICD-10-CM | POA: Diagnosis not present

## 2011-06-20 ENCOUNTER — Ambulatory Visit (INDEPENDENT_AMBULATORY_CARE_PROVIDER_SITE_OTHER): Payer: Medicare Other | Admitting: General Surgery

## 2011-06-20 ENCOUNTER — Encounter (INDEPENDENT_AMBULATORY_CARE_PROVIDER_SITE_OTHER): Payer: Self-pay | Admitting: General Surgery

## 2011-06-20 VITALS — BP 110/80 | HR 60 | Resp 18 | Wt 140.0 lb

## 2011-06-20 DIAGNOSIS — Z853 Personal history of malignant neoplasm of breast: Secondary | ICD-10-CM

## 2011-06-20 NOTE — Patient Instructions (Signed)
Call if a breast mass is found.

## 2011-06-20 NOTE — Progress Notes (Signed)
Operation:  Right partial mastectomy.  Date:  November 18, 2010.  Pathology:  T3Nx, ER/PR positive.  HPI:  She is here for a long term follow up visit. She feels good and has no complaints.  She is very pleasant and her daughter is with her.  She is on Tamoxifen.   Physical Exam: Right breast-upper outer quadrant scar is present with no palpable breast mass.  Left breast-soft, no palpable masses or visible abnormalities.   Assessment:  Right breast cancer s/p lumpectomy-no clinical evidence of recurrence.  Plan:  Return visit six months.

## 2011-06-28 DIAGNOSIS — F0391 Unspecified dementia with behavioral disturbance: Secondary | ICD-10-CM | POA: Diagnosis not present

## 2011-06-28 DIAGNOSIS — F411 Generalized anxiety disorder: Secondary | ICD-10-CM | POA: Diagnosis not present

## 2011-06-28 DIAGNOSIS — F39 Unspecified mood [affective] disorder: Secondary | ICD-10-CM | POA: Diagnosis not present

## 2011-07-06 IMAGING — CR DG PELVIS 1-2V
1 series · 1 of 1 positions shown · non-contrast
Comparison: Abdomen radiographs dated 05/17/2007.

CLINICAL DATA: The patient fell.  No pelvic symptoms given.

PELVIS - 1-2 VIEW

[t pelvis a.p.]
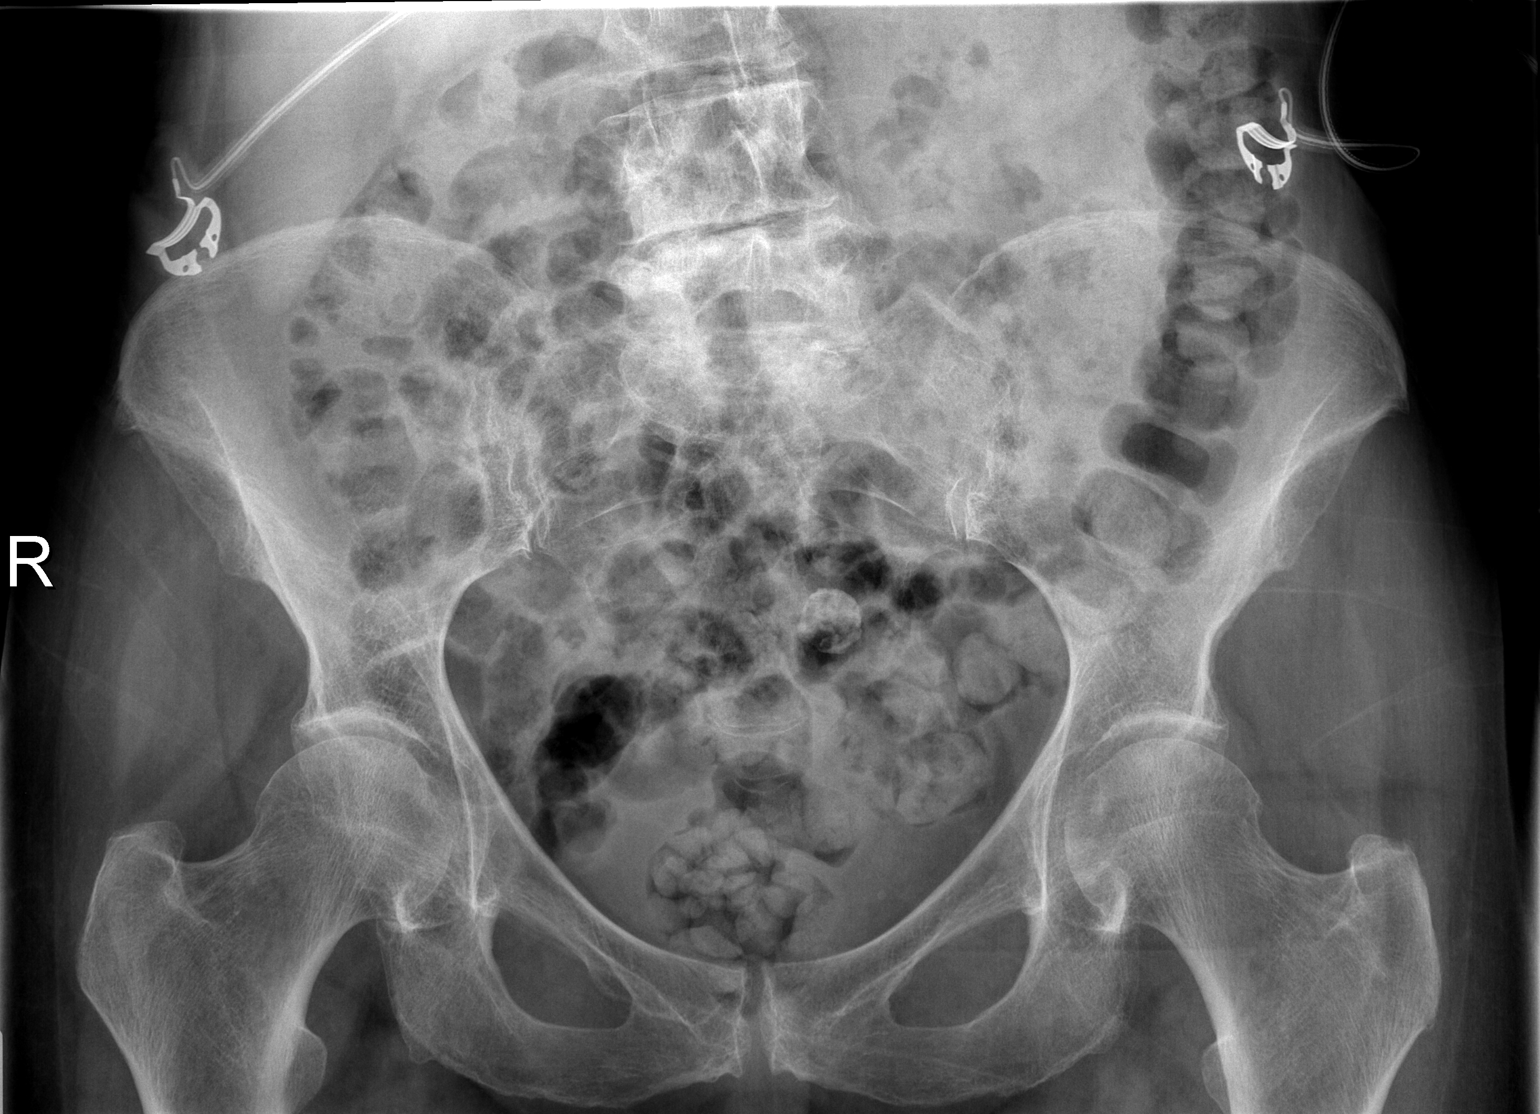

[1 of 1 positions shown; findings below may reference images not displayed]

FINDINGS: Stable small calcified uterine fibroid.  Diffuse
osteopenia.  Lower lumbar spine degenerative changes and mild
scoliosis.  No fracture or dislocation seen.
IMPRESSION: 1.  No fracture or dislocation.
2.  Diffuse osteopenia.
3.  Lower lumbar spine degenerative changes.

## 2011-07-14 DIAGNOSIS — F0390 Unspecified dementia without behavioral disturbance: Secondary | ICD-10-CM | POA: Diagnosis not present

## 2011-08-04 DIAGNOSIS — F411 Generalized anxiety disorder: Secondary | ICD-10-CM | POA: Diagnosis not present

## 2011-08-04 DIAGNOSIS — F0390 Unspecified dementia without behavioral disturbance: Secondary | ICD-10-CM | POA: Diagnosis not present

## 2011-08-04 DIAGNOSIS — F0391 Unspecified dementia with behavioral disturbance: Secondary | ICD-10-CM | POA: Diagnosis not present

## 2011-08-04 DIAGNOSIS — F39 Unspecified mood [affective] disorder: Secondary | ICD-10-CM | POA: Diagnosis not present

## 2011-08-08 DIAGNOSIS — Z79899 Other long term (current) drug therapy: Secondary | ICD-10-CM | POA: Diagnosis not present

## 2011-08-10 DIAGNOSIS — N390 Urinary tract infection, site not specified: Secondary | ICD-10-CM | POA: Diagnosis not present

## 2011-08-19 DIAGNOSIS — R1311 Dysphagia, oral phase: Secondary | ICD-10-CM | POA: Diagnosis not present

## 2011-08-22 ENCOUNTER — Encounter (HOSPITAL_COMMUNITY): Payer: Self-pay | Admitting: Oncology

## 2011-08-22 ENCOUNTER — Encounter (HOSPITAL_COMMUNITY): Payer: Medicare Other | Attending: Oncology | Admitting: Oncology

## 2011-08-22 VITALS — BP 114/78 | HR 72 | Temp 98.2°F | Wt 136.4 lb

## 2011-08-22 DIAGNOSIS — C50419 Malignant neoplasm of upper-outer quadrant of unspecified female breast: Secondary | ICD-10-CM | POA: Diagnosis not present

## 2011-08-22 DIAGNOSIS — R1311 Dysphagia, oral phase: Secondary | ICD-10-CM | POA: Diagnosis not present

## 2011-08-22 DIAGNOSIS — C50919 Malignant neoplasm of unspecified site of unspecified female breast: Secondary | ICD-10-CM | POA: Insufficient documentation

## 2011-08-22 NOTE — Patient Instructions (Signed)
[name]  [id number] 1935-06-01   [facility]  Discharge Instructions  RECOMMENDATIONS MADE BY THE CONSULTANT AND ANY TEST RESULTS WILL BE SENT TO YOUR REFERRING DOCTOR.   EXAM FINDINGS BY MD TODAY AND SIGNS AND SYMPTOMS TO REPORT TO CLINIC OR PRIMARY MD: you are doing well.  MEDICATIONS PRESCRIBED: Continue tamoxifen   INSTRUCTIONS GIVEN AND DISCUSSED: Other :  Report any new lumps, bone pain or shortness of breath.  SPECIAL INSTRUCTIONS/FOLLOW-UP: Lab work Needed today and Return to Clinic in 3 months to see MD.   I acknowledge that I have been informed and understand all the instructions given to me and received a copy. I do not have any more questions at this time, but understand that I may call the Specialty Clinic at [facility] at [phone] during business hours should I have any further questions or need assistance in obtaining follow-up care.    __________________________________________  _____________  __________ Signature of Patient or Authorized Representative            Date                   Time    __________________________________________ Nurse's Signature

## 2011-08-22 NOTE — Progress Notes (Signed)
She returns today for followup of her upper-outer quadrant right-sided breast cancer. She is status post partial mastectomy by Dr. Constance Holster by her which went very well. She is now on tamoxifen and tolerated quite well. She is still living in the [place].  She is wearing an ankle bracelet for detection should she tried to leave the center. Otherwise she has stable vital signs. She has no ankle edema. She has a soft abdomen without hepatosplenomegaly. Her lungs are clear. Her heart shows a regular rhythm and rate. The left breast is negative and the right breast is negative for any masses. She has no palpable adenopathy.  We will check only her CA 27.29 today and continue the tamoxifen and see her back in 12 weeks

## 2011-08-22 NOTE — Progress Notes (Signed)
[name] presented for labwork. Labs per MD order drawn via Peripheral Line 23 gauge needle inserted in right forearm  Good blood return present. Procedure without incident.  Needle removed intact. Patient tolerated procedure well.

## 2011-08-23 DIAGNOSIS — R1311 Dysphagia, oral phase: Secondary | ICD-10-CM | POA: Diagnosis not present

## 2011-08-23 LAB — CANCER ANTIGEN 27.29: CA 27.29: 32 U/mL (ref 0–39)

## 2011-08-24 DIAGNOSIS — R1311 Dysphagia, oral phase: Secondary | ICD-10-CM | POA: Diagnosis not present

## 2011-08-25 DIAGNOSIS — R1311 Dysphagia, oral phase: Secondary | ICD-10-CM | POA: Diagnosis not present

## 2011-08-26 DIAGNOSIS — R1311 Dysphagia, oral phase: Secondary | ICD-10-CM | POA: Diagnosis not present

## 2011-09-08 DIAGNOSIS — G319 Degenerative disease of nervous system, unspecified: Secondary | ICD-10-CM | POA: Diagnosis not present

## 2011-09-15 DIAGNOSIS — F0391 Unspecified dementia with behavioral disturbance: Secondary | ICD-10-CM | POA: Diagnosis not present

## 2011-09-15 DIAGNOSIS — F411 Generalized anxiety disorder: Secondary | ICD-10-CM | POA: Diagnosis not present

## 2011-09-15 DIAGNOSIS — F39 Unspecified mood [affective] disorder: Secondary | ICD-10-CM | POA: Diagnosis not present

## 2011-09-15 DIAGNOSIS — F0390 Unspecified dementia without behavioral disturbance: Secondary | ICD-10-CM | POA: Diagnosis not present

## 2011-11-08 ENCOUNTER — Other Ambulatory Visit (HOSPITAL_COMMUNITY): Payer: Self-pay | Admitting: Oncology

## 2011-11-08 ENCOUNTER — Ambulatory Visit (HOSPITAL_COMMUNITY)
Admit: 2011-11-08 | Discharge: 2011-11-08 | Disposition: A | Discharge status: Home / Self Care | Payer: Medicare Other | Attending: Oncology | Admitting: Oncology

## 2011-11-08 ENCOUNTER — Other Ambulatory Visit (HOSPITAL_COMMUNITY): Payer: Medicare Other

## 2011-11-08 DIAGNOSIS — C50919 Malignant neoplasm of unspecified site of unspecified female breast: Secondary | ICD-10-CM | POA: Diagnosis not present

## 2011-11-08 DIAGNOSIS — Z780 Asymptomatic menopausal state: Secondary | ICD-10-CM | POA: Insufficient documentation

## 2011-11-08 DIAGNOSIS — F39 Unspecified mood [affective] disorder: Secondary | ICD-10-CM | POA: Diagnosis not present

## 2011-11-08 DIAGNOSIS — F411 Generalized anxiety disorder: Secondary | ICD-10-CM | POA: Diagnosis not present

## 2011-11-08 DIAGNOSIS — F0391 Unspecified dementia with behavioral disturbance: Secondary | ICD-10-CM | POA: Diagnosis not present

## 2011-11-08 DIAGNOSIS — M810 Age-related osteoporosis without current pathological fracture: Secondary | ICD-10-CM | POA: Insufficient documentation

## 2011-11-08 DIAGNOSIS — F0390 Unspecified dementia without behavioral disturbance: Secondary | ICD-10-CM | POA: Diagnosis not present

## 2011-11-14 ENCOUNTER — Telehealth (HOSPITAL_COMMUNITY): Payer: Self-pay

## 2011-11-14 NOTE — Telephone Encounter (Signed)
Message copied by Mellissa Kohut on Mon Nov 14, 2011  9:47 AM ------      Message from: Tressie Stalker, ERIC S      Created: Fri Nov 11, 2011  1:26 PM       Will you call her dtr, [name], and see if she has ever taken anything other than CA++ and Vit D for her bones which are osteoporotic!!

## 2011-11-18 ENCOUNTER — Encounter (HOSPITAL_COMMUNITY): Payer: Self-pay | Admitting: Oncology

## 2011-11-18 ENCOUNTER — Other Ambulatory Visit (HOSPITAL_COMMUNITY): Payer: Self-pay | Admitting: Oncology

## 2011-11-18 DIAGNOSIS — M810 Age-related osteoporosis without current pathological fracture: Secondary | ICD-10-CM

## 2011-11-18 HISTORY — DX: Age-related osteoporosis without current pathological fracture: M81.0

## 2011-11-18 NOTE — Progress Notes (Signed)
Bone Density results discussed with patient.  She is taking calcium and vit D.  She was advised to conitnue with Calcium and Vit D.  Will start Prolia. Supportive therapy plan built.   KEFALAS,THOMAS

## 2011-11-21 ENCOUNTER — Ambulatory Visit (HOSPITAL_COMMUNITY): Payer: Medicare Other | Admitting: Oncology

## 2011-11-21 ENCOUNTER — Telehealth (HOSPITAL_COMMUNITY): Payer: Self-pay

## 2011-11-21 NOTE — Telephone Encounter (Signed)
Error

## 2011-11-23 ENCOUNTER — Encounter (HOSPITAL_COMMUNITY): Payer: Self-pay

## 2011-11-23 ENCOUNTER — Telehealth (HOSPITAL_COMMUNITY): Payer: Self-pay

## 2011-11-23 NOTE — Telephone Encounter (Signed)
Spoke with [name] to let her know that for her mother,  MD recommends Prolia injections every 6 months.  OK with [name] for injections to be given and 1st injection is scheduled for 7/24 when she sees Dr. Tressie Stalker.

## 2011-11-29 DIAGNOSIS — K3184 Gastroparesis: Secondary | ICD-10-CM | POA: Diagnosis not present

## 2011-11-30 ENCOUNTER — Encounter (HOSPITAL_COMMUNITY): Payer: Medicare Other

## 2011-11-30 ENCOUNTER — Encounter (HOSPITAL_COMMUNITY): Payer: Medicare Other | Attending: Oncology | Admitting: Oncology

## 2011-11-30 DIAGNOSIS — D649 Anemia, unspecified: Secondary | ICD-10-CM | POA: Diagnosis not present

## 2011-11-30 DIAGNOSIS — Z79899 Other long term (current) drug therapy: Secondary | ICD-10-CM | POA: Diagnosis not present

## 2011-12-05 NOTE — Progress Notes (Signed)
This encounter was created in error - please disregard.

## 2011-12-07 ENCOUNTER — Encounter (HOSPITAL_BASED_OUTPATIENT_CLINIC_OR_DEPARTMENT_OTHER): Payer: Medicare Other

## 2011-12-07 VITALS — BP 121/65 | HR 50 | Temp 98.5°F

## 2011-12-07 DIAGNOSIS — M810 Age-related osteoporosis without current pathological fracture: Secondary | ICD-10-CM | POA: Diagnosis not present

## 2011-12-07 MED ORDER — DENOSUMAB 60 MG/ML ~~LOC~~ SOLN
60.0000 mg | Freq: Once | SUBCUTANEOUS | Status: AC
  Administered 2011-12-07: 60 mg via SUBCUTANEOUS
  Filled 2011-12-07: qty 1

## 2011-12-07 NOTE — Progress Notes (Signed)
[name] presents today for injection per MD orders. prolia 60 mg administered SQ in left Abdomen. Administration without incident. Patient tolerated well.

## 2011-12-08 ENCOUNTER — Ambulatory Visit (HOSPITAL_COMMUNITY): Payer: Medicare Other

## 2011-12-08 ENCOUNTER — Ambulatory Visit (HOSPITAL_COMMUNITY): Payer: Medicare Other | Admitting: Oncology

## 2011-12-21 ENCOUNTER — Encounter: Payer: Self-pay | Admitting: Oncology

## 2011-12-29 DIAGNOSIS — F0391 Unspecified dementia with behavioral disturbance: Secondary | ICD-10-CM | POA: Diagnosis not present

## 2011-12-29 DIAGNOSIS — F411 Generalized anxiety disorder: Secondary | ICD-10-CM | POA: Diagnosis not present

## 2011-12-29 DIAGNOSIS — F0390 Unspecified dementia without behavioral disturbance: Secondary | ICD-10-CM | POA: Diagnosis not present

## 2011-12-29 DIAGNOSIS — F39 Unspecified mood [affective] disorder: Secondary | ICD-10-CM | POA: Diagnosis not present

## 2012-01-03 ENCOUNTER — Encounter (HOSPITAL_COMMUNITY): Payer: Medicare Other | Attending: Oncology | Admitting: Oncology

## 2012-01-03 ENCOUNTER — Encounter (HOSPITAL_COMMUNITY): Payer: Self-pay | Admitting: Oncology

## 2012-01-03 VITALS — BP 140/90 | HR 54 | Temp 97.4°F | Resp 18 | Wt 139.7 lb

## 2012-01-03 DIAGNOSIS — C50419 Malignant neoplasm of upper-outer quadrant of unspecified female breast: Secondary | ICD-10-CM | POA: Diagnosis not present

## 2012-01-03 DIAGNOSIS — C50919 Malignant neoplasm of unspecified site of unspecified female breast: Secondary | ICD-10-CM | POA: Insufficient documentation

## 2012-01-03 DIAGNOSIS — F0390 Unspecified dementia without behavioral disturbance: Secondary | ICD-10-CM

## 2012-01-03 LAB — CBC WITH DIFFERENTIAL/PLATELET
Basophils Absolute: 0 10*3/uL (ref 0.0–0.1)
Basophils Relative: 0 % (ref 0–1)
Eosinophils Relative: 1 % (ref 0–5)
HCT: 39.8 % (ref 36.0–46.0)
MCHC: 32.2 g/dL (ref 30.0–36.0)
MCV: 92.8 fL (ref 78.0–100.0)
Monocytes Absolute: 0.5 10*3/uL (ref 0.1–1.0)
RDW: 13.4 % (ref 11.5–15.5)

## 2012-01-03 LAB — COMPREHENSIVE METABOLIC PANEL
AST: 15 U/L (ref 0–37)
Albumin: 3.6 g/dL (ref 3.5–5.2)
Calcium: 9.4 mg/dL (ref 8.4–10.5)
Creatinine, Ser: 1.75 mg/dL — ABNORMAL HIGH (ref 0.50–1.10)

## 2012-01-03 NOTE — Patient Instructions (Addendum)
[name]  DOB November 30, 1935 CSN [id number]  MRN [id number] Dr. Everardo All   [facility]  Discharge Instructions  [place] TEST RESULTS WILL BE SENT TO YOUR REFERRING DOCTOR.   EXAM FINDINGS BY MD TODAY AND SIGNS AND SYMPTOMS TO REPORT TO CLINIC OR PRIMARY MD: Exam and discussion by Dr.Neijstrom. No new findings found.  Report any lumps, bone pain or shortness of breath.  MEDICATIONS PRESCRIBED: none   INSTRUCTIONS GIVEN AND DISCUSSED:   SPECIAL INSTRUCTIONS/FOLLOW-UP: Lab work Needed today and Return to Clinic in 3 months for follow-up.   I acknowledge that I have been informed and understand all the instructions given to me and received a copy. I do not have any more questions at this time, but understand that I may call the Specialty Clinic at [facility] at [phone] during business hours should I have any further questions or need assistance in obtaining follow-up care.    __________________________________________  _____________  __________ Signature of Patient or Authorized Representative            Date                   Time    __________________________________________ Nurse's Signature

## 2012-01-03 NOTE — Progress Notes (Signed)
#  1 local regional right-sided breast cancer status post partial mastectomy by Dr. Zella Richer which went very well. She is now on tamoxifen without side effects that she or her daughter are aware of. She is living at the [facility]. She remains very demented but very pleasant. Her daughter is worried about her nutritional status and would like a B12 level checked so we will do that for her. Her vital signs show no real difference compared to April other than her weight is up approximately 2-1/2 pounds. She is very pleasant looks in no acute distress. She has normal bowel sounds no organomegaly on her abdominal exam. There is no distention. She has no leg edema no arm edema. The right breast has healed well without lumps or bumps in either breast. She has no lymphadenopathy in the cervical, supraclavicular, infraclavicular, axillary or inguinal areas. Her lungs remained very clear. She has no thyromegaly. Heart shows a regular rhythm and rate without murmur rub or gallop. So I think she is doing very well. She will continue the tamoxifen. It is time to check blood work anyway.

## 2012-01-04 LAB — CANCER ANTIGEN 27.29: CA 27.29: 35 U/mL (ref 0–39)

## 2012-01-04 LAB — VITAMIN B12: Vitamin B-12: 639 pg/mL (ref 211–911)

## 2012-01-05 ENCOUNTER — Ambulatory Visit (INDEPENDENT_AMBULATORY_CARE_PROVIDER_SITE_OTHER): Payer: Medicare Other | Admitting: General Surgery

## 2012-01-05 ENCOUNTER — Encounter (INDEPENDENT_AMBULATORY_CARE_PROVIDER_SITE_OTHER): Payer: Self-pay | Admitting: General Surgery

## 2012-01-05 VITALS — BP 118/68 | HR 64 | Temp 98.4°F | Resp 16 | Ht 66.0 in | Wt 140.0 lb

## 2012-01-05 DIAGNOSIS — Z853 Personal history of malignant neoplasm of breast: Secondary | ICD-10-CM

## 2012-01-05 LAB — METHYLMALONIC ACID, SERUM: Methylmalonic Acid, Quantitative: 0.25 umol/L (ref <0.40)

## 2012-01-05 NOTE — Progress Notes (Signed)
Operation:  Right partial mastectomy.  Date:  November 18, 2010.  Pathology:  T3Nx, ER/PR positive.  HPI:  She is here for another long term follow up visit. She feels good and has no complaints.  She is very pleasant and her daughter is with her.  She is on Tamoxifen.  [facility]  She has some swelling in the left supraclavicular fossa.   Physical Exam: Right breast-upper outer quadrant scar is present with no palpable breast mass.  Left breast-soft, no palpable masses or visible abnormalities.  Soft swelling in left supraclavicular fossa with no adenopathy.  Assessment:  Right breast cancer s/p lumpectomy-no clinical evidence of recurrence.  No adenopathy in left supraclavicular fossa.  Plan:  It is easier to have her follow up in the [place] area and so I will see her back prn.

## 2012-01-05 NOTE — Patient Instructions (Signed)
Will see you back as needed.

## 2012-01-06 ENCOUNTER — Ambulatory Visit (HOSPITAL_COMMUNITY): Payer: Medicare Other | Attending: Internal Medicine

## 2012-01-06 DIAGNOSIS — S60229A Contusion of unspecified hand, initial encounter: Secondary | ICD-10-CM | POA: Diagnosis not present

## 2012-01-06 DIAGNOSIS — M79609 Pain in unspecified limb: Secondary | ICD-10-CM | POA: Insufficient documentation

## 2012-01-06 DIAGNOSIS — M899 Disorder of bone, unspecified: Secondary | ICD-10-CM | POA: Insufficient documentation

## 2012-01-06 DIAGNOSIS — M949 Disorder of cartilage, unspecified: Secondary | ICD-10-CM | POA: Diagnosis not present

## 2012-02-14 DIAGNOSIS — F39 Unspecified mood [affective] disorder: Secondary | ICD-10-CM | POA: Diagnosis not present

## 2012-02-14 DIAGNOSIS — F411 Generalized anxiety disorder: Secondary | ICD-10-CM | POA: Diagnosis not present

## 2012-02-14 DIAGNOSIS — F0391 Unspecified dementia with behavioral disturbance: Secondary | ICD-10-CM | POA: Diagnosis not present

## 2012-02-14 DIAGNOSIS — F0390 Unspecified dementia without behavioral disturbance: Secondary | ICD-10-CM | POA: Diagnosis not present

## 2012-02-14 DIAGNOSIS — K3184 Gastroparesis: Secondary | ICD-10-CM | POA: Diagnosis not present

## 2012-02-15 DIAGNOSIS — D649 Anemia, unspecified: Secondary | ICD-10-CM | POA: Diagnosis not present

## 2012-02-15 DIAGNOSIS — I10 Essential (primary) hypertension: Secondary | ICD-10-CM | POA: Diagnosis not present

## 2012-02-15 DIAGNOSIS — Z7901 Long term (current) use of anticoagulants: Secondary | ICD-10-CM | POA: Diagnosis not present

## 2012-03-09 DIAGNOSIS — K3184 Gastroparesis: Secondary | ICD-10-CM | POA: Diagnosis not present

## 2012-03-12 DIAGNOSIS — I10 Essential (primary) hypertension: Secondary | ICD-10-CM | POA: Diagnosis not present

## 2012-03-22 DIAGNOSIS — H109 Unspecified conjunctivitis: Secondary | ICD-10-CM | POA: Diagnosis not present

## 2012-04-03 ENCOUNTER — Encounter (HOSPITAL_COMMUNITY): Payer: Medicare Other | Attending: Oncology | Admitting: Oncology

## 2012-04-03 ENCOUNTER — Encounter (HOSPITAL_COMMUNITY): Payer: Self-pay | Admitting: Oncology

## 2012-04-03 VITALS — BP 155/91 | HR 65 | Resp 16 | Wt 133.0 lb

## 2012-04-03 DIAGNOSIS — C50419 Malignant neoplasm of upper-outer quadrant of unspecified female breast: Secondary | ICD-10-CM | POA: Diagnosis not present

## 2012-04-03 DIAGNOSIS — C50919 Malignant neoplasm of unspecified site of unspecified female breast: Secondary | ICD-10-CM

## 2012-04-03 DIAGNOSIS — F0390 Unspecified dementia without behavioral disturbance: Secondary | ICD-10-CM | POA: Diagnosis not present

## 2012-04-03 NOTE — Progress Notes (Signed)
Problem #1 right-sided breast cancer with local regional involvement status post partial mastectomy by Dr. Zella Richer after she progressed on Arimidex. She is now on tamoxifen. She is living at the [facility]. There no obvious side effects that I can discern from the tamoxifen. She is having trouble I think with walking more due to or vision then anything else. Her daughter was concerned that her dementia was getting worse but I think that is stable. She remains very demented but she has trouble seeing things in front of her it appears.  Her vital signs show her weight set up 6 pounds. Other vital signs are stable. She has no lymphadenopathy in her cervical, supraclavicular, or infraclavicular or axillary areas. The left breast is negative for masses. The right breast is negative for masses. She has no ankle edema. She is in no acute distress.  We need to discontinue the tamoxifen for now. I will see her in 3 months.

## 2012-04-03 NOTE — Patient Instructions (Addendum)
[facility]  Discharge Instructions  RECOMMENDATIONS MADE BY THE CONSULTANT AND ANY TEST RESULTS WILL BE SENT TO YOUR REFERRING DOCTOR.   EXAM FINDINGS BY MD TODAY AND SIGNS AND SYMPTOMS TO REPORT TO CLINIC OR PRIMARY MD:   Return to see Dr. Tressie Stalker in 3 months Jul 02 2012 @ 10am  I acknowledge that I have been informed and understand all the instructions given to me and received a copy. I do not have any more questions at this time, but understand that I may call the Specialty Clinic at [facility] at [phone] during business hours should I have any further questions or need assistance in obtaining follow-up care.    __________________________________________  _____________  __________ Signature of Patient or Authorized Representative            Date                   Time    __________________________________________ Nurse's Signature

## 2012-04-09 DIAGNOSIS — B999 Unspecified infectious disease: Secondary | ICD-10-CM | POA: Diagnosis not present

## 2012-04-09 DIAGNOSIS — N159 Renal tubulo-interstitial disease, unspecified: Secondary | ICD-10-CM | POA: Diagnosis not present

## 2012-04-09 DIAGNOSIS — R1115 Cyclical vomiting syndrome unrelated to migraine: Secondary | ICD-10-CM | POA: Diagnosis not present

## 2012-04-09 DIAGNOSIS — N390 Urinary tract infection, site not specified: Secondary | ICD-10-CM | POA: Diagnosis not present

## 2012-04-09 DIAGNOSIS — E870 Hyperosmolality and hypernatremia: Secondary | ICD-10-CM | POA: Diagnosis not present

## 2012-04-09 DIAGNOSIS — I10 Essential (primary) hypertension: Secondary | ICD-10-CM | POA: Diagnosis not present

## 2012-04-09 DIAGNOSIS — D649 Anemia, unspecified: Secondary | ICD-10-CM | POA: Diagnosis not present

## 2012-04-10 DIAGNOSIS — R509 Fever, unspecified: Secondary | ICD-10-CM | POA: Diagnosis not present

## 2012-04-11 DIAGNOSIS — D649 Anemia, unspecified: Secondary | ICD-10-CM | POA: Diagnosis not present

## 2012-04-11 DIAGNOSIS — K5732 Diverticulitis of large intestine without perforation or abscess without bleeding: Secondary | ICD-10-CM | POA: Diagnosis not present

## 2012-04-11 DIAGNOSIS — E870 Hyperosmolality and hypernatremia: Secondary | ICD-10-CM | POA: Diagnosis not present

## 2012-04-11 DIAGNOSIS — N189 Chronic kidney disease, unspecified: Secondary | ICD-10-CM | POA: Diagnosis not present

## 2012-04-12 DIAGNOSIS — M6281 Muscle weakness (generalized): Secondary | ICD-10-CM | POA: Diagnosis not present

## 2012-04-12 DIAGNOSIS — Z9181 History of falling: Secondary | ICD-10-CM | POA: Diagnosis not present

## 2012-04-12 DIAGNOSIS — R262 Difficulty in walking, not elsewhere classified: Secondary | ICD-10-CM | POA: Diagnosis not present

## 2012-04-12 DIAGNOSIS — F0391 Unspecified dementia with behavioral disturbance: Secondary | ICD-10-CM | POA: Diagnosis not present

## 2012-04-12 DIAGNOSIS — R1311 Dysphagia, oral phase: Secondary | ICD-10-CM | POA: Diagnosis not present

## 2012-04-13 DIAGNOSIS — D649 Anemia, unspecified: Secondary | ICD-10-CM | POA: Diagnosis not present

## 2012-04-13 DIAGNOSIS — R262 Difficulty in walking, not elsewhere classified: Secondary | ICD-10-CM | POA: Diagnosis not present

## 2012-04-13 DIAGNOSIS — R1311 Dysphagia, oral phase: Secondary | ICD-10-CM | POA: Diagnosis not present

## 2012-04-13 DIAGNOSIS — M6281 Muscle weakness (generalized): Secondary | ICD-10-CM | POA: Diagnosis not present

## 2012-04-13 DIAGNOSIS — E870 Hyperosmolality and hypernatremia: Secondary | ICD-10-CM | POA: Diagnosis not present

## 2012-04-13 DIAGNOSIS — F0391 Unspecified dementia with behavioral disturbance: Secondary | ICD-10-CM | POA: Diagnosis not present

## 2012-04-13 DIAGNOSIS — Z9181 History of falling: Secondary | ICD-10-CM | POA: Diagnosis not present

## 2012-04-13 DIAGNOSIS — I10 Essential (primary) hypertension: Secondary | ICD-10-CM | POA: Diagnosis not present

## 2012-04-16 DIAGNOSIS — R262 Difficulty in walking, not elsewhere classified: Secondary | ICD-10-CM | POA: Diagnosis not present

## 2012-04-16 DIAGNOSIS — R1311 Dysphagia, oral phase: Secondary | ICD-10-CM | POA: Diagnosis not present

## 2012-04-16 DIAGNOSIS — K5732 Diverticulitis of large intestine without perforation or abscess without bleeding: Secondary | ICD-10-CM | POA: Diagnosis not present

## 2012-04-16 DIAGNOSIS — E870 Hyperosmolality and hypernatremia: Secondary | ICD-10-CM | POA: Diagnosis not present

## 2012-04-16 DIAGNOSIS — F0391 Unspecified dementia with behavioral disturbance: Secondary | ICD-10-CM | POA: Diagnosis not present

## 2012-04-16 DIAGNOSIS — M6281 Muscle weakness (generalized): Secondary | ICD-10-CM | POA: Diagnosis not present

## 2012-04-16 DIAGNOSIS — Z9181 History of falling: Secondary | ICD-10-CM | POA: Diagnosis not present

## 2012-04-17 DIAGNOSIS — Z9181 History of falling: Secondary | ICD-10-CM | POA: Diagnosis not present

## 2012-04-17 DIAGNOSIS — M6281 Muscle weakness (generalized): Secondary | ICD-10-CM | POA: Diagnosis not present

## 2012-04-17 DIAGNOSIS — F0391 Unspecified dementia with behavioral disturbance: Secondary | ICD-10-CM | POA: Diagnosis not present

## 2012-04-17 DIAGNOSIS — R1311 Dysphagia, oral phase: Secondary | ICD-10-CM | POA: Diagnosis not present

## 2012-04-17 DIAGNOSIS — R262 Difficulty in walking, not elsewhere classified: Secondary | ICD-10-CM | POA: Diagnosis not present

## 2012-04-18 DIAGNOSIS — R1311 Dysphagia, oral phase: Secondary | ICD-10-CM | POA: Diagnosis not present

## 2012-04-18 DIAGNOSIS — Z9181 History of falling: Secondary | ICD-10-CM | POA: Diagnosis not present

## 2012-04-18 DIAGNOSIS — R262 Difficulty in walking, not elsewhere classified: Secondary | ICD-10-CM | POA: Diagnosis not present

## 2012-04-18 DIAGNOSIS — M6281 Muscle weakness (generalized): Secondary | ICD-10-CM | POA: Diagnosis not present

## 2012-04-18 DIAGNOSIS — F0391 Unspecified dementia with behavioral disturbance: Secondary | ICD-10-CM | POA: Diagnosis not present

## 2012-04-18 DIAGNOSIS — I10 Essential (primary) hypertension: Secondary | ICD-10-CM | POA: Diagnosis not present

## 2012-04-19 DIAGNOSIS — F0391 Unspecified dementia with behavioral disturbance: Secondary | ICD-10-CM | POA: Diagnosis not present

## 2012-04-19 DIAGNOSIS — Z9181 History of falling: Secondary | ICD-10-CM | POA: Diagnosis not present

## 2012-04-19 DIAGNOSIS — N2881 Hypertrophy of kidney: Secondary | ICD-10-CM | POA: Diagnosis not present

## 2012-04-19 DIAGNOSIS — M6281 Muscle weakness (generalized): Secondary | ICD-10-CM | POA: Diagnosis not present

## 2012-04-19 DIAGNOSIS — R262 Difficulty in walking, not elsewhere classified: Secondary | ICD-10-CM | POA: Diagnosis not present

## 2012-04-19 DIAGNOSIS — R1311 Dysphagia, oral phase: Secondary | ICD-10-CM | POA: Diagnosis not present

## 2012-04-20 DIAGNOSIS — R1311 Dysphagia, oral phase: Secondary | ICD-10-CM | POA: Diagnosis not present

## 2012-04-20 DIAGNOSIS — R262 Difficulty in walking, not elsewhere classified: Secondary | ICD-10-CM | POA: Diagnosis not present

## 2012-04-20 DIAGNOSIS — F0391 Unspecified dementia with behavioral disturbance: Secondary | ICD-10-CM | POA: Diagnosis not present

## 2012-04-20 DIAGNOSIS — M6281 Muscle weakness (generalized): Secondary | ICD-10-CM | POA: Diagnosis not present

## 2012-04-20 DIAGNOSIS — Z9181 History of falling: Secondary | ICD-10-CM | POA: Diagnosis not present

## 2012-04-22 DIAGNOSIS — Z9181 History of falling: Secondary | ICD-10-CM | POA: Diagnosis not present

## 2012-04-22 DIAGNOSIS — R262 Difficulty in walking, not elsewhere classified: Secondary | ICD-10-CM | POA: Diagnosis not present

## 2012-04-22 DIAGNOSIS — M6281 Muscle weakness (generalized): Secondary | ICD-10-CM | POA: Diagnosis not present

## 2012-04-22 DIAGNOSIS — F0391 Unspecified dementia with behavioral disturbance: Secondary | ICD-10-CM | POA: Diagnosis not present

## 2012-04-22 DIAGNOSIS — R1311 Dysphagia, oral phase: Secondary | ICD-10-CM | POA: Diagnosis not present

## 2012-04-23 DIAGNOSIS — M6281 Muscle weakness (generalized): Secondary | ICD-10-CM | POA: Diagnosis not present

## 2012-04-23 DIAGNOSIS — R262 Difficulty in walking, not elsewhere classified: Secondary | ICD-10-CM | POA: Diagnosis not present

## 2012-04-23 DIAGNOSIS — F0391 Unspecified dementia with behavioral disturbance: Secondary | ICD-10-CM | POA: Diagnosis not present

## 2012-04-23 DIAGNOSIS — E870 Hyperosmolality and hypernatremia: Secondary | ICD-10-CM | POA: Diagnosis not present

## 2012-04-23 DIAGNOSIS — I10 Essential (primary) hypertension: Secondary | ICD-10-CM | POA: Diagnosis not present

## 2012-04-23 DIAGNOSIS — Z9181 History of falling: Secondary | ICD-10-CM | POA: Diagnosis not present

## 2012-04-23 DIAGNOSIS — R1311 Dysphagia, oral phase: Secondary | ICD-10-CM | POA: Diagnosis not present

## 2012-04-24 DIAGNOSIS — R1311 Dysphagia, oral phase: Secondary | ICD-10-CM | POA: Diagnosis not present

## 2012-04-24 DIAGNOSIS — Z9181 History of falling: Secondary | ICD-10-CM | POA: Diagnosis not present

## 2012-04-24 DIAGNOSIS — F0391 Unspecified dementia with behavioral disturbance: Secondary | ICD-10-CM | POA: Diagnosis not present

## 2012-04-24 DIAGNOSIS — R262 Difficulty in walking, not elsewhere classified: Secondary | ICD-10-CM | POA: Diagnosis not present

## 2012-04-24 DIAGNOSIS — N390 Urinary tract infection, site not specified: Secondary | ICD-10-CM | POA: Diagnosis not present

## 2012-04-24 DIAGNOSIS — M6281 Muscle weakness (generalized): Secondary | ICD-10-CM | POA: Diagnosis not present

## 2012-04-25 DIAGNOSIS — M6281 Muscle weakness (generalized): Secondary | ICD-10-CM | POA: Diagnosis not present

## 2012-04-25 DIAGNOSIS — Z79899 Other long term (current) drug therapy: Secondary | ICD-10-CM | POA: Diagnosis not present

## 2012-04-25 DIAGNOSIS — R262 Difficulty in walking, not elsewhere classified: Secondary | ICD-10-CM | POA: Diagnosis not present

## 2012-04-25 DIAGNOSIS — I10 Essential (primary) hypertension: Secondary | ICD-10-CM | POA: Diagnosis not present

## 2012-04-25 DIAGNOSIS — F0391 Unspecified dementia with behavioral disturbance: Secondary | ICD-10-CM | POA: Diagnosis not present

## 2012-04-25 DIAGNOSIS — R1311 Dysphagia, oral phase: Secondary | ICD-10-CM | POA: Diagnosis not present

## 2012-04-25 DIAGNOSIS — Z9181 History of falling: Secondary | ICD-10-CM | POA: Diagnosis not present

## 2012-04-26 DIAGNOSIS — M6281 Muscle weakness (generalized): Secondary | ICD-10-CM | POA: Diagnosis not present

## 2012-04-26 DIAGNOSIS — Z9181 History of falling: Secondary | ICD-10-CM | POA: Diagnosis not present

## 2012-04-26 DIAGNOSIS — R1311 Dysphagia, oral phase: Secondary | ICD-10-CM | POA: Diagnosis not present

## 2012-04-26 DIAGNOSIS — R262 Difficulty in walking, not elsewhere classified: Secondary | ICD-10-CM | POA: Diagnosis not present

## 2012-04-26 DIAGNOSIS — F0391 Unspecified dementia with behavioral disturbance: Secondary | ICD-10-CM | POA: Diagnosis not present

## 2012-04-27 DIAGNOSIS — F0391 Unspecified dementia with behavioral disturbance: Secondary | ICD-10-CM | POA: Diagnosis not present

## 2012-04-27 DIAGNOSIS — Z9181 History of falling: Secondary | ICD-10-CM | POA: Diagnosis not present

## 2012-04-27 DIAGNOSIS — R262 Difficulty in walking, not elsewhere classified: Secondary | ICD-10-CM | POA: Diagnosis not present

## 2012-04-27 DIAGNOSIS — M6281 Muscle weakness (generalized): Secondary | ICD-10-CM | POA: Diagnosis not present

## 2012-04-27 DIAGNOSIS — R1311 Dysphagia, oral phase: Secondary | ICD-10-CM | POA: Diagnosis not present

## 2012-04-29 DIAGNOSIS — F0391 Unspecified dementia with behavioral disturbance: Secondary | ICD-10-CM | POA: Diagnosis not present

## 2012-04-29 DIAGNOSIS — M6281 Muscle weakness (generalized): Secondary | ICD-10-CM | POA: Diagnosis not present

## 2012-04-29 DIAGNOSIS — R262 Difficulty in walking, not elsewhere classified: Secondary | ICD-10-CM | POA: Diagnosis not present

## 2012-04-29 DIAGNOSIS — R1311 Dysphagia, oral phase: Secondary | ICD-10-CM | POA: Diagnosis not present

## 2012-04-29 DIAGNOSIS — Z9181 History of falling: Secondary | ICD-10-CM | POA: Diagnosis not present

## 2012-04-30 DIAGNOSIS — R1311 Dysphagia, oral phase: Secondary | ICD-10-CM | POA: Diagnosis not present

## 2012-04-30 DIAGNOSIS — Z9181 History of falling: Secondary | ICD-10-CM | POA: Diagnosis not present

## 2012-04-30 DIAGNOSIS — R262 Difficulty in walking, not elsewhere classified: Secondary | ICD-10-CM | POA: Diagnosis not present

## 2012-04-30 DIAGNOSIS — F0391 Unspecified dementia with behavioral disturbance: Secondary | ICD-10-CM | POA: Diagnosis not present

## 2012-04-30 DIAGNOSIS — M6281 Muscle weakness (generalized): Secondary | ICD-10-CM | POA: Diagnosis not present

## 2012-05-01 DIAGNOSIS — M6281 Muscle weakness (generalized): Secondary | ICD-10-CM | POA: Diagnosis not present

## 2012-05-01 DIAGNOSIS — Z9181 History of falling: Secondary | ICD-10-CM | POA: Diagnosis not present

## 2012-05-01 DIAGNOSIS — F0391 Unspecified dementia with behavioral disturbance: Secondary | ICD-10-CM | POA: Diagnosis not present

## 2012-05-01 DIAGNOSIS — R1311 Dysphagia, oral phase: Secondary | ICD-10-CM | POA: Diagnosis not present

## 2012-05-01 DIAGNOSIS — R262 Difficulty in walking, not elsewhere classified: Secondary | ICD-10-CM | POA: Diagnosis not present

## 2012-05-03 DIAGNOSIS — R1311 Dysphagia, oral phase: Secondary | ICD-10-CM | POA: Diagnosis not present

## 2012-05-03 DIAGNOSIS — M6281 Muscle weakness (generalized): Secondary | ICD-10-CM | POA: Diagnosis not present

## 2012-05-03 DIAGNOSIS — Z9181 History of falling: Secondary | ICD-10-CM | POA: Diagnosis not present

## 2012-05-03 DIAGNOSIS — R262 Difficulty in walking, not elsewhere classified: Secondary | ICD-10-CM | POA: Diagnosis not present

## 2012-05-03 DIAGNOSIS — F0391 Unspecified dementia with behavioral disturbance: Secondary | ICD-10-CM | POA: Diagnosis not present

## 2012-05-03 DIAGNOSIS — R635 Abnormal weight gain: Secondary | ICD-10-CM | POA: Diagnosis not present

## 2012-05-04 DIAGNOSIS — R1311 Dysphagia, oral phase: Secondary | ICD-10-CM | POA: Diagnosis not present

## 2012-05-04 DIAGNOSIS — Z9181 History of falling: Secondary | ICD-10-CM | POA: Diagnosis not present

## 2012-05-04 DIAGNOSIS — F0391 Unspecified dementia with behavioral disturbance: Secondary | ICD-10-CM | POA: Diagnosis not present

## 2012-05-04 DIAGNOSIS — R262 Difficulty in walking, not elsewhere classified: Secondary | ICD-10-CM | POA: Diagnosis not present

## 2012-05-04 DIAGNOSIS — M6281 Muscle weakness (generalized): Secondary | ICD-10-CM | POA: Diagnosis not present

## 2012-05-05 DIAGNOSIS — Z9181 History of falling: Secondary | ICD-10-CM | POA: Diagnosis not present

## 2012-05-05 DIAGNOSIS — R1311 Dysphagia, oral phase: Secondary | ICD-10-CM | POA: Diagnosis not present

## 2012-05-05 DIAGNOSIS — M6281 Muscle weakness (generalized): Secondary | ICD-10-CM | POA: Diagnosis not present

## 2012-05-05 DIAGNOSIS — F0391 Unspecified dementia with behavioral disturbance: Secondary | ICD-10-CM | POA: Diagnosis not present

## 2012-05-05 DIAGNOSIS — R262 Difficulty in walking, not elsewhere classified: Secondary | ICD-10-CM | POA: Diagnosis not present

## 2012-05-06 DIAGNOSIS — R262 Difficulty in walking, not elsewhere classified: Secondary | ICD-10-CM | POA: Diagnosis not present

## 2012-05-06 DIAGNOSIS — M6281 Muscle weakness (generalized): Secondary | ICD-10-CM | POA: Diagnosis not present

## 2012-05-06 DIAGNOSIS — F0391 Unspecified dementia with behavioral disturbance: Secondary | ICD-10-CM | POA: Diagnosis not present

## 2012-05-06 DIAGNOSIS — R1311 Dysphagia, oral phase: Secondary | ICD-10-CM | POA: Diagnosis not present

## 2012-05-06 DIAGNOSIS — Z9181 History of falling: Secondary | ICD-10-CM | POA: Diagnosis not present

## 2012-05-07 DIAGNOSIS — M6281 Muscle weakness (generalized): Secondary | ICD-10-CM | POA: Diagnosis not present

## 2012-05-07 DIAGNOSIS — F0391 Unspecified dementia with behavioral disturbance: Secondary | ICD-10-CM | POA: Diagnosis not present

## 2012-05-07 DIAGNOSIS — R1311 Dysphagia, oral phase: Secondary | ICD-10-CM | POA: Diagnosis not present

## 2012-05-07 DIAGNOSIS — Z9181 History of falling: Secondary | ICD-10-CM | POA: Diagnosis not present

## 2012-05-07 DIAGNOSIS — R262 Difficulty in walking, not elsewhere classified: Secondary | ICD-10-CM | POA: Diagnosis not present

## 2012-05-08 DIAGNOSIS — R262 Difficulty in walking, not elsewhere classified: Secondary | ICD-10-CM | POA: Diagnosis not present

## 2012-05-08 DIAGNOSIS — Z9181 History of falling: Secondary | ICD-10-CM | POA: Diagnosis not present

## 2012-05-08 DIAGNOSIS — M6281 Muscle weakness (generalized): Secondary | ICD-10-CM | POA: Diagnosis not present

## 2012-05-08 DIAGNOSIS — F0391 Unspecified dementia with behavioral disturbance: Secondary | ICD-10-CM | POA: Diagnosis not present

## 2012-05-08 DIAGNOSIS — R1311 Dysphagia, oral phase: Secondary | ICD-10-CM | POA: Diagnosis not present

## 2012-05-09 DIAGNOSIS — M6281 Muscle weakness (generalized): Secondary | ICD-10-CM | POA: Diagnosis not present

## 2012-05-09 DIAGNOSIS — Z9181 History of falling: Secondary | ICD-10-CM | POA: Diagnosis not present

## 2012-05-09 DIAGNOSIS — R269 Unspecified abnormalities of gait and mobility: Secondary | ICD-10-CM | POA: Diagnosis not present

## 2012-05-09 DIAGNOSIS — R1311 Dysphagia, oral phase: Secondary | ICD-10-CM | POA: Diagnosis not present

## 2012-05-09 DIAGNOSIS — F0391 Unspecified dementia with behavioral disturbance: Secondary | ICD-10-CM | POA: Diagnosis not present

## 2012-05-09 DIAGNOSIS — R262 Difficulty in walking, not elsewhere classified: Secondary | ICD-10-CM | POA: Diagnosis not present

## 2012-05-10 DIAGNOSIS — F0391 Unspecified dementia with behavioral disturbance: Secondary | ICD-10-CM | POA: Diagnosis not present

## 2012-05-10 DIAGNOSIS — R262 Difficulty in walking, not elsewhere classified: Secondary | ICD-10-CM | POA: Diagnosis not present

## 2012-05-10 DIAGNOSIS — M6281 Muscle weakness (generalized): Secondary | ICD-10-CM | POA: Diagnosis not present

## 2012-05-10 DIAGNOSIS — Z9181 History of falling: Secondary | ICD-10-CM | POA: Diagnosis not present

## 2012-05-10 DIAGNOSIS — R1311 Dysphagia, oral phase: Secondary | ICD-10-CM | POA: Diagnosis not present

## 2012-05-11 DIAGNOSIS — F0391 Unspecified dementia with behavioral disturbance: Secondary | ICD-10-CM | POA: Diagnosis not present

## 2012-05-11 DIAGNOSIS — Z9181 History of falling: Secondary | ICD-10-CM | POA: Diagnosis not present

## 2012-05-11 DIAGNOSIS — R1311 Dysphagia, oral phase: Secondary | ICD-10-CM | POA: Diagnosis not present

## 2012-05-11 DIAGNOSIS — R262 Difficulty in walking, not elsewhere classified: Secondary | ICD-10-CM | POA: Diagnosis not present

## 2012-05-11 DIAGNOSIS — M6281 Muscle weakness (generalized): Secondary | ICD-10-CM | POA: Diagnosis not present

## 2012-05-14 DIAGNOSIS — F0391 Unspecified dementia with behavioral disturbance: Secondary | ICD-10-CM | POA: Diagnosis not present

## 2012-05-14 DIAGNOSIS — M6281 Muscle weakness (generalized): Secondary | ICD-10-CM | POA: Diagnosis not present

## 2012-05-14 DIAGNOSIS — R1311 Dysphagia, oral phase: Secondary | ICD-10-CM | POA: Diagnosis not present

## 2012-05-14 DIAGNOSIS — R262 Difficulty in walking, not elsewhere classified: Secondary | ICD-10-CM | POA: Diagnosis not present

## 2012-05-14 DIAGNOSIS — Z9181 History of falling: Secondary | ICD-10-CM | POA: Diagnosis not present

## 2012-05-15 DIAGNOSIS — R262 Difficulty in walking, not elsewhere classified: Secondary | ICD-10-CM | POA: Diagnosis not present

## 2012-05-15 DIAGNOSIS — R1311 Dysphagia, oral phase: Secondary | ICD-10-CM | POA: Diagnosis not present

## 2012-05-15 DIAGNOSIS — M6281 Muscle weakness (generalized): Secondary | ICD-10-CM | POA: Diagnosis not present

## 2012-05-15 DIAGNOSIS — Z9181 History of falling: Secondary | ICD-10-CM | POA: Diagnosis not present

## 2012-05-15 DIAGNOSIS — F0391 Unspecified dementia with behavioral disturbance: Secondary | ICD-10-CM | POA: Diagnosis not present

## 2012-05-16 DIAGNOSIS — R1311 Dysphagia, oral phase: Secondary | ICD-10-CM | POA: Diagnosis not present

## 2012-05-16 DIAGNOSIS — F0391 Unspecified dementia with behavioral disturbance: Secondary | ICD-10-CM | POA: Diagnosis not present

## 2012-05-16 DIAGNOSIS — M6281 Muscle weakness (generalized): Secondary | ICD-10-CM | POA: Diagnosis not present

## 2012-05-16 DIAGNOSIS — Z9181 History of falling: Secondary | ICD-10-CM | POA: Diagnosis not present

## 2012-05-16 DIAGNOSIS — R262 Difficulty in walking, not elsewhere classified: Secondary | ICD-10-CM | POA: Diagnosis not present

## 2012-05-18 DIAGNOSIS — Z9181 History of falling: Secondary | ICD-10-CM | POA: Diagnosis not present

## 2012-05-18 DIAGNOSIS — F0391 Unspecified dementia with behavioral disturbance: Secondary | ICD-10-CM | POA: Diagnosis not present

## 2012-05-18 DIAGNOSIS — M6281 Muscle weakness (generalized): Secondary | ICD-10-CM | POA: Diagnosis not present

## 2012-05-18 DIAGNOSIS — R1311 Dysphagia, oral phase: Secondary | ICD-10-CM | POA: Diagnosis not present

## 2012-05-18 DIAGNOSIS — R262 Difficulty in walking, not elsewhere classified: Secondary | ICD-10-CM | POA: Diagnosis not present

## 2012-05-19 DIAGNOSIS — R1311 Dysphagia, oral phase: Secondary | ICD-10-CM | POA: Diagnosis not present

## 2012-05-19 DIAGNOSIS — F0391 Unspecified dementia with behavioral disturbance: Secondary | ICD-10-CM | POA: Diagnosis not present

## 2012-05-19 DIAGNOSIS — Z9181 History of falling: Secondary | ICD-10-CM | POA: Diagnosis not present

## 2012-05-19 DIAGNOSIS — R262 Difficulty in walking, not elsewhere classified: Secondary | ICD-10-CM | POA: Diagnosis not present

## 2012-05-19 DIAGNOSIS — M6281 Muscle weakness (generalized): Secondary | ICD-10-CM | POA: Diagnosis not present

## 2012-05-21 DIAGNOSIS — Z9181 History of falling: Secondary | ICD-10-CM | POA: Diagnosis not present

## 2012-05-21 DIAGNOSIS — F0391 Unspecified dementia with behavioral disturbance: Secondary | ICD-10-CM | POA: Diagnosis not present

## 2012-05-21 DIAGNOSIS — R262 Difficulty in walking, not elsewhere classified: Secondary | ICD-10-CM | POA: Diagnosis not present

## 2012-05-21 DIAGNOSIS — M6281 Muscle weakness (generalized): Secondary | ICD-10-CM | POA: Diagnosis not present

## 2012-05-21 DIAGNOSIS — R1311 Dysphagia, oral phase: Secondary | ICD-10-CM | POA: Diagnosis not present

## 2012-05-22 DIAGNOSIS — R1311 Dysphagia, oral phase: Secondary | ICD-10-CM | POA: Diagnosis not present

## 2012-05-22 DIAGNOSIS — M6281 Muscle weakness (generalized): Secondary | ICD-10-CM | POA: Diagnosis not present

## 2012-05-22 DIAGNOSIS — R262 Difficulty in walking, not elsewhere classified: Secondary | ICD-10-CM | POA: Diagnosis not present

## 2012-05-22 DIAGNOSIS — F0391 Unspecified dementia with behavioral disturbance: Secondary | ICD-10-CM | POA: Diagnosis not present

## 2012-05-22 DIAGNOSIS — Z9181 History of falling: Secondary | ICD-10-CM | POA: Diagnosis not present

## 2012-05-23 DIAGNOSIS — F0391 Unspecified dementia with behavioral disturbance: Secondary | ICD-10-CM | POA: Diagnosis not present

## 2012-05-23 DIAGNOSIS — M6281 Muscle weakness (generalized): Secondary | ICD-10-CM | POA: Diagnosis not present

## 2012-05-23 DIAGNOSIS — R262 Difficulty in walking, not elsewhere classified: Secondary | ICD-10-CM | POA: Diagnosis not present

## 2012-05-23 DIAGNOSIS — R1311 Dysphagia, oral phase: Secondary | ICD-10-CM | POA: Diagnosis not present

## 2012-05-23 DIAGNOSIS — Z9181 History of falling: Secondary | ICD-10-CM | POA: Diagnosis not present

## 2012-05-24 DIAGNOSIS — F0391 Unspecified dementia with behavioral disturbance: Secondary | ICD-10-CM | POA: Diagnosis not present

## 2012-05-24 DIAGNOSIS — R1311 Dysphagia, oral phase: Secondary | ICD-10-CM | POA: Diagnosis not present

## 2012-05-24 DIAGNOSIS — Z9181 History of falling: Secondary | ICD-10-CM | POA: Diagnosis not present

## 2012-05-24 DIAGNOSIS — M6281 Muscle weakness (generalized): Secondary | ICD-10-CM | POA: Diagnosis not present

## 2012-05-24 DIAGNOSIS — R262 Difficulty in walking, not elsewhere classified: Secondary | ICD-10-CM | POA: Diagnosis not present

## 2012-05-25 DIAGNOSIS — R1311 Dysphagia, oral phase: Secondary | ICD-10-CM | POA: Diagnosis not present

## 2012-05-25 DIAGNOSIS — F0391 Unspecified dementia with behavioral disturbance: Secondary | ICD-10-CM | POA: Diagnosis not present

## 2012-05-25 DIAGNOSIS — M6281 Muscle weakness (generalized): Secondary | ICD-10-CM | POA: Diagnosis not present

## 2012-05-25 DIAGNOSIS — R262 Difficulty in walking, not elsewhere classified: Secondary | ICD-10-CM | POA: Diagnosis not present

## 2012-05-25 DIAGNOSIS — Z9181 History of falling: Secondary | ICD-10-CM | POA: Diagnosis not present

## 2012-05-28 DIAGNOSIS — F0391 Unspecified dementia with behavioral disturbance: Secondary | ICD-10-CM | POA: Diagnosis not present

## 2012-05-28 DIAGNOSIS — R1311 Dysphagia, oral phase: Secondary | ICD-10-CM | POA: Diagnosis not present

## 2012-05-28 DIAGNOSIS — R262 Difficulty in walking, not elsewhere classified: Secondary | ICD-10-CM | POA: Diagnosis not present

## 2012-05-28 DIAGNOSIS — M6281 Muscle weakness (generalized): Secondary | ICD-10-CM | POA: Diagnosis not present

## 2012-05-28 DIAGNOSIS — Z9181 History of falling: Secondary | ICD-10-CM | POA: Diagnosis not present

## 2012-05-29 DIAGNOSIS — M6281 Muscle weakness (generalized): Secondary | ICD-10-CM | POA: Diagnosis not present

## 2012-05-29 DIAGNOSIS — F0391 Unspecified dementia with behavioral disturbance: Secondary | ICD-10-CM | POA: Diagnosis not present

## 2012-05-29 DIAGNOSIS — R262 Difficulty in walking, not elsewhere classified: Secondary | ICD-10-CM | POA: Diagnosis not present

## 2012-05-29 DIAGNOSIS — R1311 Dysphagia, oral phase: Secondary | ICD-10-CM | POA: Diagnosis not present

## 2012-05-29 DIAGNOSIS — Z9181 History of falling: Secondary | ICD-10-CM | POA: Diagnosis not present

## 2012-05-30 DIAGNOSIS — R1311 Dysphagia, oral phase: Secondary | ICD-10-CM | POA: Diagnosis not present

## 2012-05-30 DIAGNOSIS — R262 Difficulty in walking, not elsewhere classified: Secondary | ICD-10-CM | POA: Diagnosis not present

## 2012-05-30 DIAGNOSIS — Z9181 History of falling: Secondary | ICD-10-CM | POA: Diagnosis not present

## 2012-05-30 DIAGNOSIS — F0391 Unspecified dementia with behavioral disturbance: Secondary | ICD-10-CM | POA: Diagnosis not present

## 2012-05-30 DIAGNOSIS — M6281 Muscle weakness (generalized): Secondary | ICD-10-CM | POA: Diagnosis not present

## 2012-05-31 DIAGNOSIS — R1311 Dysphagia, oral phase: Secondary | ICD-10-CM | POA: Diagnosis not present

## 2012-05-31 DIAGNOSIS — R262 Difficulty in walking, not elsewhere classified: Secondary | ICD-10-CM | POA: Diagnosis not present

## 2012-05-31 DIAGNOSIS — F0391 Unspecified dementia with behavioral disturbance: Secondary | ICD-10-CM | POA: Diagnosis not present

## 2012-05-31 DIAGNOSIS — M6281 Muscle weakness (generalized): Secondary | ICD-10-CM | POA: Diagnosis not present

## 2012-05-31 DIAGNOSIS — B999 Unspecified infectious disease: Secondary | ICD-10-CM | POA: Diagnosis not present

## 2012-05-31 DIAGNOSIS — N2881 Hypertrophy of kidney: Secondary | ICD-10-CM | POA: Diagnosis not present

## 2012-05-31 DIAGNOSIS — D649 Anemia, unspecified: Secondary | ICD-10-CM | POA: Diagnosis not present

## 2012-05-31 DIAGNOSIS — Z79899 Other long term (current) drug therapy: Secondary | ICD-10-CM | POA: Diagnosis not present

## 2012-05-31 DIAGNOSIS — I10 Essential (primary) hypertension: Secondary | ICD-10-CM | POA: Diagnosis not present

## 2012-05-31 DIAGNOSIS — Z9181 History of falling: Secondary | ICD-10-CM | POA: Diagnosis not present

## 2012-06-01 ENCOUNTER — Ambulatory Visit (HOSPITAL_COMMUNITY)
Admit: 2012-06-01 | Discharge: 2012-06-01 | Disposition: A | Discharge status: Home / Self Care | Payer: Medicare Other | Attending: Internal Medicine | Admitting: Internal Medicine

## 2012-06-01 DIAGNOSIS — Z9181 History of falling: Secondary | ICD-10-CM | POA: Diagnosis not present

## 2012-06-01 DIAGNOSIS — R1311 Dysphagia, oral phase: Secondary | ICD-10-CM | POA: Diagnosis not present

## 2012-06-01 DIAGNOSIS — F0391 Unspecified dementia with behavioral disturbance: Secondary | ICD-10-CM | POA: Diagnosis not present

## 2012-06-01 DIAGNOSIS — R0989 Other specified symptoms and signs involving the circulatory and respiratory systems: Secondary | ICD-10-CM | POA: Diagnosis not present

## 2012-06-01 DIAGNOSIS — N390 Urinary tract infection, site not specified: Secondary | ICD-10-CM | POA: Diagnosis not present

## 2012-06-01 DIAGNOSIS — M6281 Muscle weakness (generalized): Secondary | ICD-10-CM | POA: Diagnosis not present

## 2012-06-01 DIAGNOSIS — I10 Essential (primary) hypertension: Secondary | ICD-10-CM | POA: Insufficient documentation

## 2012-06-01 DIAGNOSIS — J9819 Other pulmonary collapse: Secondary | ICD-10-CM | POA: Diagnosis not present

## 2012-06-01 DIAGNOSIS — R262 Difficulty in walking, not elsewhere classified: Secondary | ICD-10-CM | POA: Diagnosis not present

## 2012-06-04 DIAGNOSIS — I10 Essential (primary) hypertension: Secondary | ICD-10-CM | POA: Diagnosis not present

## 2012-06-04 DIAGNOSIS — D649 Anemia, unspecified: Secondary | ICD-10-CM | POA: Diagnosis not present

## 2012-06-04 DIAGNOSIS — R404 Transient alteration of awareness: Secondary | ICD-10-CM | POA: Diagnosis not present

## 2012-06-11 DIAGNOSIS — I10 Essential (primary) hypertension: Secondary | ICD-10-CM | POA: Diagnosis not present

## 2012-06-11 DIAGNOSIS — D649 Anemia, unspecified: Secondary | ICD-10-CM | POA: Diagnosis not present

## 2012-07-02 ENCOUNTER — Encounter (HOSPITAL_COMMUNITY): Payer: Medicare Other | Attending: Oncology | Admitting: Oncology

## 2012-07-02 VITALS — BP 156/82 | HR 61 | Temp 97.0°F | Resp 16 | Wt 137.7 lb

## 2012-07-02 DIAGNOSIS — C50419 Malignant neoplasm of upper-outer quadrant of unspecified female breast: Secondary | ICD-10-CM

## 2012-07-02 DIAGNOSIS — F0390 Unspecified dementia without behavioral disturbance: Secondary | ICD-10-CM | POA: Diagnosis not present

## 2012-07-02 DIAGNOSIS — C50919 Malignant neoplasm of unspecified site of unspecified female breast: Secondary | ICD-10-CM

## 2012-07-02 NOTE — Patient Instructions (Addendum)
[place] Discharge Instructions  RECOMMENDATIONS MADE BY THE CONSULTANT AND ANY TEST RESULTS WILL BE SENT TO YOUR REFERRING PHYSICIAN.  Return to clinic in May to see MD. Lab work 1 week prior to doctor's appointment.  Thank you for choosing [facility] to provide your oncology and hematology care.  To afford each patient quality time with our providers, please arrive at least 15 minutes before your scheduled appointment time.  With your help, our goal is to use those 15 minutes to complete the necessary work-up to ensure our physicians have the information they need to help with your evaluation and healthcare recommendations.    Effective January 1st, 2014, we ask that you re-schedule your appointment with our physicians should you arrive 10 or more minutes late for your appointment.  We strive to give you quality time with our providers, and arriving late affects you and other patients whose appointments are after yours.    Again, thank you for choosing [facility].  Our hope is that these requests will decrease the amount of time that you wait before being seen by our physicians.       _____________________________________________________________  Should you have questions after your visit to [facility], please contact our office at [phone] between the hours of 8:30 a.m. and 5:00 p.m.  Voicemails left after 4:30 p.m. will not be returned until the following business day.  For prescription refill requests, have your pharmacy contact our office with your prescription refill request.

## 2012-07-02 NOTE — Progress Notes (Signed)
#  1 right sided breast cancer with local regional involvement status post partial mastectomy after progressing on Arimidex. She is now on tamoxifen. She still dependent nursing [facility]. She is accompanied by her daughter today. She has no complaints. Vital signs are stable. She is in no acute distress. She has a soft  abdomen, no swelling of her legs or arms. She does have a few ecchymoses on her left forearm and right forearm. She is very thin scan and is on aspirin.  She has no lymphadenopathy in the cervical, supraclavicular, infraclavicular or axillary regions. Both breasts are negative for masses. The right breast incision is well-healed.  Therefore we will continue the tamoxifen and see her back in 12 weeks. We will do blood work in 12 weeks when she returns.  She remains very demented but very pleasant.

## 2012-07-03 ENCOUNTER — Ambulatory Visit (HOSPITAL_COMMUNITY): Payer: Medicare Other | Admitting: Oncology

## 2012-07-03 ENCOUNTER — Encounter (HOSPITAL_COMMUNITY): Payer: Self-pay | Admitting: Dietician

## 2012-07-03 NOTE — Progress Notes (Signed)
Pt identified for weight loss an [facility] Nutrition Screen.   Chart reviewed. Pt is a long-term resident of [facility], who is well known to me and RD covering [facility].  Wt Readings from Last 10 Encounters:  07/02/12 137 lb 11.2 oz (62.46 kg)  04/03/12 133 lb (60.328 kg)  01/05/12 140 lb (63.504 kg)  01/03/12 139 lb 11.2 oz (63.368 kg)  11/30/11 138 lb 3.2 oz (62.687 kg)  08/22/11 136 lb 6.4 oz (61.871 kg)  06/20/11 140 lb (63.504 kg)  05/23/11 146 lb (66.225 kg)  02/16/11 150 lb 3.2 oz (68.13 kg)  12/24/10 150 lb 6.4 oz (68.221 kg)   Review of medical record indicates that pt wt over the past year has ranged between 133-140#. Weight fluctuates. Noted a significant wt loss of 5% between 01/05/12 and 04/03/12. Weight changes within the past year (2.1% loss), past 6 months (1.4% loss), and past 3 months (3% gain) have not been significant. Weight trending up.  Pt has multiple nutrition interventions in place at [facility], including modified diet and several nutrition supplement orders. Recommend continue with current nutrition POC.   Joaquim Lai, RD, LDN Pager: [phone]

## 2012-07-23 DIAGNOSIS — K3184 Gastroparesis: Secondary | ICD-10-CM | POA: Diagnosis not present

## 2012-07-25 DIAGNOSIS — D649 Anemia, unspecified: Secondary | ICD-10-CM | POA: Diagnosis not present

## 2012-07-25 DIAGNOSIS — I10 Essential (primary) hypertension: Secondary | ICD-10-CM | POA: Diagnosis not present

## 2012-09-03 ENCOUNTER — Other Ambulatory Visit: Payer: Self-pay | Admitting: *Deleted

## 2012-09-20 ENCOUNTER — Non-Acute Institutional Stay (SKILLED_NURSING_FACILITY): Payer: Medicare Other | Admitting: Internal Medicine

## 2012-09-20 DIAGNOSIS — C50919 Malignant neoplasm of unspecified site of unspecified female breast: Secondary | ICD-10-CM

## 2012-09-20 DIAGNOSIS — F068 Other specified mental disorders due to known physiological condition: Secondary | ICD-10-CM | POA: Diagnosis not present

## 2012-09-20 DIAGNOSIS — I10 Essential (primary) hypertension: Secondary | ICD-10-CM | POA: Diagnosis not present

## 2012-09-20 DIAGNOSIS — K5900 Constipation, unspecified: Secondary | ICD-10-CM

## 2012-09-20 DIAGNOSIS — M810 Age-related osteoporosis without current pathological fracture: Secondary | ICD-10-CM

## 2012-09-20 DIAGNOSIS — R1319 Other dysphagia: Secondary | ICD-10-CM

## 2012-09-20 DIAGNOSIS — K5909 Other constipation: Secondary | ICD-10-CM | POA: Insufficient documentation

## 2012-09-20 DIAGNOSIS — K149 Disease of tongue, unspecified: Secondary | ICD-10-CM

## 2012-09-20 DIAGNOSIS — S62101D Fracture of unspecified carpal bone, right wrist, subsequent encounter for fracture with routine healing: Secondary | ICD-10-CM

## 2012-09-20 DIAGNOSIS — S5290XD Unspecified fracture of unspecified forearm, subsequent encounter for closed fracture with routine healing: Secondary | ICD-10-CM

## 2012-09-20 DIAGNOSIS — F0390 Unspecified dementia without behavioral disturbance: Secondary | ICD-10-CM

## 2012-09-20 NOTE — Progress Notes (Signed)
Patient ID: [name], female   DOB: 28-Jun-1935, 77 y.o.   MRN: [id number]  Acute/routine visit Facility-[facility] nursing LOC-skilled  Chief complaint-medical management of gastroparesis with dysphagia-dementia with bipolar disorder hypertension history of breast cancer status post lumpectomy.  History of present illness.  Patient is a pleasant long-term resident at this facility with the above diagnosis-she continues to be quite stable.  She's had some acute issues were her creatinine would rise in white count would go up as well-this responded to antibiotics and fluids although no clear reason for this was found-nonetheless she has been stable now for some time.  She does have a history of gastroparesis with dysphagia had been on higher dose Zofran as well as Reglan 5 mg 4 times a day-this was recently reduced 2.5 mg 4 times a day and  Zofran was reduced to 4 mg 4 times a day-despite dose reduction she is doing well with this.   actually has gained weight I suspect this is somewhat recovery from having those acute episodes were she did not eat real well she certainly appears to be back at her baseline  She does have dementia with bipolar disorder-this appears to be stable she is on Depakote which appears to help also has Ativan when necessary but does not take this a lot.  She also has a history of right breast cancer status post lumpectomy-this is followed by oncology she continues on tamoxifen.  Also a history of right arm fracture her recoveries been unremarkable he had been on calcium with vitamin D however calcium level was somewhat elevated calcium as been discontinued.  Hypertension appears stable on Toprol resume Lopressor 20 26/83-120/80.  She has a history of renal insufficiency but the recent creatinine was near her baseline at 1.72 this was back in March we will update.  Most acute issue today apparently is a small abrasion or ulcer on the tip of her tongue-her daughter who is  the dietitian in this facility says her mom does have some discomfort with this when she's eating.  Family medical social history as been reviewed per history and physical on 05/19/2010.  Medications as been reviewed per MAR.  Review of systems quite limited secondary to dementia please see history of present illness.  Physical exam.  Temperature 97.3 pulse 68 respirations 20 blood pressure 126/83 weight is 145 she is gradually gaining some weight and back at her baseline.  In general this is a somewhat frail elderly female in no distress she is pleasantly confused smiling.  Her skin is warm and dry.  Eyes pupils appear equal round reactive to light sclera and conjunctiva are clear visual acuity appears grossly intact.  Mouth she has numerous extractions I do not a small ulcer on the tip of her tongue mucous membranes are moist.  Breasts are within normal limits she does have a partial lumpectomy right breast.  Chest is clear to auscultation without rhonchi rales or wheezes  Heart is regular rate and rhythm without murmur gallop or rub she does not really have much lower extremity edema I would say trace.  Abdomen soft nontender with active bowel sounds.  Muscle skeletal-ambulates about the facility at baseline was all extremities x4 I do not note any deformities here.  Neurologic appears grossly intact her speech is clear I do not see any lateralizing findings.  Psych she is oriented to self only continues to be pleasantly confused.  Labs.  07/25/2012.  WBC 5.8 hemoglobin 11.3 platelets 226.  Sodium 135  potassium 3.9 BUN 28 creatinine 1.72 calcium 9.1  05/31/2012.  Valproic acid level  35.1  02/15/2012.  The BBC 4.8 hemoglobin 12.3 platelets 184.  Sodium 142 potassium 4 BUN 20 creatinine 1.58.  Liver function tests within normal limits except albumin of 3.3-bilirubin 0.2.  Assessment and plan.  #1-history of dysphasia-gastroparesis-this is stable despite  reduction of Reglan continues on Zofran as well also on Prilosec-this appears to be stable continue to monitor she is gaining weight which is encouraging.  #2-hypertension-this appears to be stable Toprol.  #3-history tongue discomfort question ulcer-will order Magic mouthwash to use a swab 3 times a day as needed and monitor  #4-history of breast cancer-continues on tamoxifen she is followed by oncology.  #5-history of hypernatremia renal insufficiency-Will update BMP.  #6-history of dementia with bipolar disorder-this appears stable on Depakote Will update Depakote level as well as liver function tests.  #7  history of right arm fracture   h as done well calcium discontinued because of borderline high calcium on recent metabolic panel-this appears to have normalized we will await updated calcium level-also will check vitamin D level.  #8-anemia-this appears relatively Baseline suspect an element of chronic disease.  #9-constipation-this appears to be stable on Senokot.  [id number] note greater than 30 minutes spent assessing patient and formulating a plan of care for numerous diagnoses    .  [name]

## 2012-09-24 DIAGNOSIS — I10 Essential (primary) hypertension: Secondary | ICD-10-CM | POA: Diagnosis not present

## 2012-09-24 DIAGNOSIS — E559 Vitamin D deficiency, unspecified: Secondary | ICD-10-CM | POA: Diagnosis not present

## 2012-09-24 DIAGNOSIS — Z79899 Other long term (current) drug therapy: Secondary | ICD-10-CM | POA: Diagnosis not present

## 2012-09-25 ENCOUNTER — Encounter (HOSPITAL_COMMUNITY): Payer: Medicare Other | Attending: Hematology and Oncology

## 2012-09-25 DIAGNOSIS — C50919 Malignant neoplasm of unspecified site of unspecified female breast: Secondary | ICD-10-CM | POA: Insufficient documentation

## 2012-09-25 DIAGNOSIS — C50419 Malignant neoplasm of upper-outer quadrant of unspecified female breast: Secondary | ICD-10-CM

## 2012-09-25 LAB — COMPREHENSIVE METABOLIC PANEL
ALT: 11 U/L (ref 0–35)
Albumin: 3.6 g/dL (ref 3.5–5.2)
Alkaline Phosphatase: 57 U/L (ref 39–117)
GFR calc Af Amer: 37 mL/min — ABNORMAL LOW (ref >90)
Glucose, Bld: 86 mg/dL (ref 70–99)
Potassium: 4.3 mEq/L (ref 3.5–5.1)
Sodium: 141 mEq/L (ref 135–145)
Total Protein: 7.8 g/dL (ref 6.0–8.3)

## 2012-09-25 LAB — CBC WITH DIFFERENTIAL/PLATELET
Eosinophils Absolute: 0.1 10*3/uL (ref 0.0–0.7)
Lymphs Abs: 2.1 10*3/uL (ref 0.7–4.0)
MCH: 29.5 pg (ref 26.0–34.0)
Neutrophils Relative %: 69 % (ref 43–77)
Platelets: 229 10*3/uL (ref 150–400)
RBC: 4.61 MIL/uL (ref 3.87–5.11)
WBC: 8.9 10*3/uL (ref 4.0–10.5)

## 2012-09-25 NOTE — Progress Notes (Signed)
Labs drawn today for cbc/diff,cmp,[id number]

## 2012-10-02 ENCOUNTER — Telehealth (HOSPITAL_COMMUNITY): Payer: Self-pay | Admitting: *Deleted

## 2012-10-02 ENCOUNTER — Ambulatory Visit (HOSPITAL_COMMUNITY): Payer: Medicare Other | Admitting: Oncology

## 2012-10-02 NOTE — Telephone Encounter (Signed)
Recheck CA 27 [phone] in 3-4 weeks

## 2012-10-04 ENCOUNTER — Inpatient Hospital Stay (HOSPITAL_COMMUNITY): Payer: Medicare Other

## 2012-10-04 ENCOUNTER — Ambulatory Visit (HOSPITAL_COMMUNITY)
Admission: RE | Admit: 2012-10-04 | Discharge: 2012-10-04 | Disposition: A | Discharge status: Home / Self Care | Payer: Medicare Other | Source: Ambulatory Visit | Attending: Internal Medicine | Admitting: Internal Medicine

## 2012-10-04 ENCOUNTER — Non-Acute Institutional Stay (SKILLED_NURSING_FACILITY): Payer: Medicare Other | Admitting: Internal Medicine

## 2012-10-04 DIAGNOSIS — M25569 Pain in unspecified knee: Secondary | ICD-10-CM | POA: Insufficient documentation

## 2012-10-04 DIAGNOSIS — R609 Edema, unspecified: Secondary | ICD-10-CM | POA: Diagnosis not present

## 2012-10-04 DIAGNOSIS — M1710 Unilateral primary osteoarthritis, unspecified knee: Secondary | ICD-10-CM | POA: Insufficient documentation

## 2012-10-04 DIAGNOSIS — IMO0002 Reserved for concepts with insufficient information to code with codable children: Secondary | ICD-10-CM | POA: Diagnosis not present

## 2012-10-04 NOTE — Progress Notes (Signed)
Patient ID: [name], female   DOB: 1935/12/12, 77 y.o.   MRN: [id number] This is an acute visit.  Healthcare skilled-facility [facility].  Chief complaint-acute visit secondary to left knee edema-pain?.  History of present illness.  Patient is a pleasant elderly resident who was discovered incidentally this evening to have some swelling of her left knee.  There is a small amount of bruising and she initially apparently complained of pain to staff although she's not really complaining of pain now  She has significant dementia and cannot really give an accurate review of systems.  She does ambulate about the facility.  Family medical social history has been reviewed per history and physical on 05/19/2010.  Medications have been reviewed.  Review of systems-as stated in history of present illness.  Physical exam.  She is afebrile bash pulse is 67-respirations 19.  In general this is a frail elderly female in no distress lying comfortably in bed.  Her skin is warm and dry there is a very small amount of bruising around the left knee.  Chest is clear to auscultation without any labored breathing.  Heart is regular rate and rhythm without murmur gallop or  Rub  Muscle skeletal-there is some increased edema around her left knee-I do not really see any significant lower extremity edema-pedal pulses intact.  The  area around the knee is flesh-colored not really warm not significantly erythematous small amount of bruising  Range of motion and she did not appear to have discomfort with flexion and extension at the knee.  Labs.  09/26/2011.  WBC 8.9 hemoglobin 13.6 platelets 229.  Sodium 141 potassium 4.3 BUN 26 creatinine 1.54.  Liver function tests within normal limits-albumin was 3.6.  Assessment and plan.  -- left knee  edema with question pain -- Will obtain an x-ray-also nonweightbearing until after results obtained---   [id number]

## 2012-10-11 DIAGNOSIS — H40019 Open angle with borderline findings, low risk, unspecified eye: Secondary | ICD-10-CM | POA: Diagnosis not present

## 2012-10-11 DIAGNOSIS — H2510 Age-related nuclear cataract, unspecified eye: Secondary | ICD-10-CM | POA: Diagnosis not present

## 2012-10-11 DIAGNOSIS — Z79899 Other long term (current) drug therapy: Secondary | ICD-10-CM | POA: Diagnosis not present

## 2012-10-30 ENCOUNTER — Encounter (HOSPITAL_COMMUNITY): Payer: Medicare Other | Attending: Hematology and Oncology

## 2012-10-30 DIAGNOSIS — C50419 Malignant neoplasm of upper-outer quadrant of unspecified female breast: Secondary | ICD-10-CM

## 2012-10-30 DIAGNOSIS — C50919 Malignant neoplasm of unspecified site of unspecified female breast: Secondary | ICD-10-CM | POA: Insufficient documentation

## 2012-10-30 NOTE — Progress Notes (Signed)
Labs drawn today for [id number] 

## 2012-11-13 ENCOUNTER — Non-Acute Institutional Stay (SKILLED_NURSING_FACILITY): Payer: Medicare Other | Admitting: Internal Medicine

## 2012-11-13 DIAGNOSIS — R609 Edema, unspecified: Secondary | ICD-10-CM | POA: Diagnosis not present

## 2012-11-13 DIAGNOSIS — I10 Essential (primary) hypertension: Secondary | ICD-10-CM

## 2012-11-13 DIAGNOSIS — K5900 Constipation, unspecified: Secondary | ICD-10-CM

## 2012-11-13 DIAGNOSIS — R1319 Other dysphagia: Secondary | ICD-10-CM | POA: Diagnosis not present

## 2012-11-13 DIAGNOSIS — F068 Other specified mental disorders due to known physiological condition: Secondary | ICD-10-CM | POA: Diagnosis not present

## 2012-11-13 DIAGNOSIS — C50919 Malignant neoplasm of unspecified site of unspecified female breast: Secondary | ICD-10-CM

## 2012-11-13 NOTE — Progress Notes (Signed)
Patient ID: [name], female   DOB: Jun 25, 1935, 77 y.o.   MRN: [id number] Acute/routine visit  Facility-[facility]  LOC-skilled     Chief complaint-medical management of gastroparesis with dysphagia-dementia with bipolar disorder hypertension history of breast cancer status post lumpectomy.--Acute visit secondary to possible edema weight gain   History of present illness.  Patient is a pleasant long-term resident at this facility with the above diagnosis-she continues to be quite stable.  She's had some acute issues were her creatinine would rise in white count would go up as well-this responded to antibiotics and fluids although no clear reason for this was found-nonetheless she has been stable now for some time.  She does have a history of gastroparesis with dysphagia had been on higher dose Zofran as well as Reglan 5 mg 4 times a day-this was recently reduced 2.5 mg 4 times a day and Zofran was reduced to 4 mg 4 times a day-despite dose reduction she is doing well with this--she is eating relatively well and has gained some weight.  There was some question about how much weight she is gain.  Apparently it appears she had gained 5 pounds -- then lost about that amount on the next weight now she's gained again four more pounds --- there is quite a bit of variability here  Her daughter thought she had had some edema of her legs at one point but this appears to have largely disappeared.   She does have dementia with bipolar disorder-this appears to be stable she is on Depakote which appears to help also has Ativan when necessary but does not take this a lot.  She also has a history of right breast cancer status post lumpectomy-this is followed by oncology she continues on tamoxifen.  Also a history of right arm fracture her recoveries been unremarkable he had been on calcium with vitamin D however calcium level was somewhat elevated calcium as been discontinued.  Hypertension appears  stable on Toprol --recent blood pressures 120/82-120/84.  She has a history of renal insufficiency but the recent creatinine was near her baseline at 1.54 this was back in May we will update.   .  Family medical social history as been reviewed per history and physical on 05/19/2010.   Medications as been reviewed per MAR .  Review of systems quite limited secondary to dementia please see history of present illness She is not complaining of any shortness of breath chest pain continues to ambulate about the facility at her baseline-pleasantly confused nursing staff has not noted any acute issues-again there's been the question of some increased edema although this does not appear to be apparent today.  [name]  Physical exam.   She is afebrile pulse is 82 respirations 20 blood pressure 124/62.  Weight 153 she has gradually gained weight again there's been some variability as noted above .  In general this is a somewhat frail elderly female in no distress she is pleasantly confused smiling.  Her skin is warm and dry.  Eyes pupils appear equal round reactive to light sclera and conjunctiva are clear visual acuity appears grossly intact.  Mouth she has numerous extractions I mucous membranes are moist.  .  Chest is clear to auscultation without rhonchi rales or wheezes  Heart is regular rate and rhythm without murmur gallop or rub she does not really have much lower extremity edema I would say trace--this is essentially unchanged from previous exams .  Abdomen soft nontender with active bowel sounds.  Muscle skeletal-ambulates about the facility at baseline was all extremities x4 I do not note any deformities here .  Neurologic appears grossly intact her speech is clear I do not see any lateralizing findings .  Psych she is oriented to self only continues to be pleasantly confused .  Labs.   09/25/2012.  WBC 8.9 hemoglobin 13.6 platelets 229  Sodium 141 potassium 4.3 BUN 26 creatinine  1.54.  Liver function tests within normal limits I note an albumin of 3.6.  09/25/2012.  Vitamin D level-31.  09/24/2012.  Depakote level-41.0   07/25/2012.  WBC 5.8 hemoglobin 11.3 platelets 226.  Sodium 135 potassium 3.9 BUN 28 creatinine 1.72 calcium 9.1  05/31/2012.  Valproic acid level 35.1  02/15/2012.  WBC 4.8 hemoglobin 12.3 platelets 184.  Sodium 142 potassium 4 BUN 20 creatinine 1.58.  Liver function tests within normal limits except albumin of 3.3-bilirubin 0.2.   Assessment and plan.  #1-history of dysphasia-gastroparesis-this is stable despite reduction of Reglan continues on Zofran as well also on Prilosec-this appears to be stable continue to monitor she is gaining weight although how much is somewhat in question  #2-hypertension-this appears to be stable on Toprol.  #3--edema-?  I do not really note much today she appears to be at her baseline  #4-history of breast cancer-continues on tamoxifen she is followed by oncology.  #5-history of hypernatremia renal insufficiency-Will update BMP.  #6-history of dementia with bipolar disorder-this appears stable on Depakote .  #7 history of right arm fracture h as done well calcium discontinued because of borderline high calcium on recent metabolic panel-this appears to have normalized .  #8-anemia-this appears relatively Baseline suspect an element of chronic disease.  #9-constipation-this appears to be stable on Senokot .  [id number] note greater than 30 minutes spent assessing patient and formulating a plan of care for numerous diagnoses  .  [name]

## 2012-11-14 DIAGNOSIS — N189 Chronic kidney disease, unspecified: Secondary | ICD-10-CM | POA: Diagnosis not present

## 2012-11-19 ENCOUNTER — Encounter (HOSPITAL_COMMUNITY): Payer: Medicare Other | Attending: Hematology and Oncology

## 2012-11-19 ENCOUNTER — Encounter (HOSPITAL_COMMUNITY): Payer: Self-pay

## 2012-11-19 VITALS — BP 143/84 | HR 53 | Temp 97.8°F | Resp 18 | Wt 147.2 lb

## 2012-11-19 DIAGNOSIS — E876 Hypokalemia: Secondary | ICD-10-CM

## 2012-11-19 DIAGNOSIS — C50919 Malignant neoplasm of unspecified site of unspecified female breast: Secondary | ICD-10-CM

## 2012-11-19 NOTE — Patient Instructions (Addendum)
[place] Discharge Instructions  RECOMMENDATIONS MADE BY THE CONSULTANT AND ANY TEST RESULTS WILL BE SENT TO YOUR REFERRING PHYSICIAN.  EXAM FINDINGS BY THE PHYSICIAN TODAY AND SIGNS OR SYMPTOMS TO REPORT TO CLINIC OR PRIMARY PHYSICIAN: Exam and discussion by Dr. Alinda Deem.  Want her to eat foods that are rich in potassium and also want her to increase her fluid intake.  MEDICATIONS PRESCRIBED:  None - to continue the Tamoxifen.  INSTRUCTIONS GIVEN AND DISCUSSED: Report any new lumps, bone pain or shortness of breath.  SPECIAL INSTRUCTIONS/FOLLOW-UP: Repeat BMET in 1 week and to be seen in follow-up in 3 months.  Thank you for choosing [facility] to provide your oncology and hematology care.  To afford each patient quality time with our providers, please arrive at least 15 minutes before your scheduled appointment time.  With your help, our goal is to use those 15 minutes to complete the necessary work-up to ensure our physicians have the information they need to help with your evaluation and healthcare recommendations.    Effective January 1st, 2014, we ask that you re-schedule your appointment with our physicians should you arrive 10 or more minutes late for your appointment.  We strive to give you quality time with our providers, and arriving late affects you and other patients whose appointments are after yours.    Again, thank you for choosing [facility].  Our hope is that these requests will decrease the amount of time that you wait before being seen by our physicians.       _____________________________________________________________  Should you have questions after your visit to [facility], please contact our office at [phone] between the hours of 8:30 a.m. and 5:00 p.m.  Voicemails left after 4:30 p.m. will not be returned until the following business day.  For prescription refill requests, have your pharmacy  contact our office with your prescription refill request.

## 2012-11-19 NOTE — Progress Notes (Addendum)
[facility] [phone]   Fax:([phone]) [place], [place] [address]  DIAGNOSIS: Right sided breast invasive ductal carcinoma, ER/PR + Her -2- neu negative. yPT3,ypNX yp Mx  .  ONCOLOGIC HISTORY: Per notes form Dr Neijstrom;Right-sided breast cancer upper outer quadrant with progression on  Arimidex, status post a limited segmental mastectomy by Dr. Zella Richer in July 2012 at which time we switched her to tamoxifen  10 mg b.i.d.   INTERVAL HISTORY:    [name] 77 y.o. female returns to the clinic today for Follow up of her breast cancer diagnosed 08/17/09, accompanied by [name].  She has baseline dementia was unable to give any meaningful history.  History was provided by  [name] recent dietitian here with [place].  Patient resides in a nursing home.  She has no new problem today.  Had about tells me that she is eating well, feels her legs get tired sometimes otherwise no aches and pains or lumps or bumps .    MEDICAL HISTORY: Past Medical History  Diagnosis Date  . Dementia   . Gastroparalysis   . HTN (hypertension)   . Gallstones   . Reflux   . Cancer   . Osteoporosis 11/18/2011    ALLERGIES:  is allergic to aricept and lithium.  MEDICATIONS:  No current outpatient prescriptions on file.   No current facility-administered medications for this visit.    SURGICAL HISTORY:  Past Surgical History  Procedure Laterality Date  . Tonsillectomy and adenoidectomy    . Breast biopsy    . Breast lumpectomy       REVIEW OF SYSTEMS: 14 point review of system is as in the history above otherwise negative.    PHYSICAL EXAMINATION:  Blood pressure 143/84, pulse 53, temperature 97.8 F (36.6 C), temperature source Oral, resp. rate 18, weight 147 lb 3.2 oz (66.769 kg). GENERAL: No distress, well nourished.  SKIN:  No rashes or significant lesions  HEAD: Normocephalic, No masses,  lesions, tenderness or abnormalities  EYES: Conjunctiva are pink and non-injected  ENT: was uncooperative with exam LYMPH: No palpable lymphadenopathy,  BREAST:Normal without mass, skin or nipple changes or axillary nodes, well-healed scar in the right the upper quadrant of the breast. LUNGS: Poorly cooperative with exam. HEART: regular rate & rhythm, no murmurs, no gallops, S1 normal and S2 normal  ABDOMEN: was uncooperative with the exam. EXTREMITIES: No edema, no skin discoloration or tenderness NEURO:Demented , no apparent focal motor/sensory deficits.     LABORATORY DATA: Lab Results  Component Value Date   WBC 8.9 09/25/2012   HGB 13.6 09/25/2012   HCT 43.3 09/25/2012   MCV 93.9 09/25/2012   PLT 229 09/25/2012      Chemistry      Component Value Date/Time   NA 141 09/25/2012 1102   K 4.3 09/25/2012 1102   CL 105 09/25/2012 1102   CO2 24 09/25/2012 1102   BUN 26* 09/25/2012 1102   CREATININE 1.54* 09/25/2012 1102      Component Value Date/Time   CALCIUM 9.4 09/25/2012 1102   CALCIUM 10.1 05/17/2007 2230   ALKPHOS 57 09/25/2012 1102   AST 18 09/25/2012 1102   ALT 11 09/25/2012 1102   BILITOT 0.3 09/25/2012 1102       RADIOGRAPHIC STUDIES: No results found.   ASSESSMENT: yPT3,ypNX yp Mx ER/PR + Her -2- neu negative invasive ductal carcinoma of the right breast status  post right lumpectomy after reportedly progression on Arimidex given neoadjuvantly.  At this point patient has no clinical evidence of disease.  She does have mild hypokalemia and some evidence of acute kidney injury probably form poor oral intake related to dementia.   PLAN:  1. Continue tamoxifen, plan will be to give this for total of 5 years  2. I advise potassium rich diet.I am reluctant to prescribe potassium tablets giving that patient does have some renal impairment which can easily precipitate hyperkalemia. I advised increase fluid intake up to 1-1.5 L daily.  I would repeat BMP and week to review progress  in this regard. 3. She'll return to clinic in 3 months.     All questions were satisfactorily answered. [name] knows to call if she has any concern.  I spent more than 50 % counseling the patient face to face. The total time spent in the appointment was 30 minutes.   Verlan Friends, MD FACP. Hematology/Oncology.

## 2012-11-20 ENCOUNTER — Ambulatory Visit (HOSPITAL_COMMUNITY): Payer: Medicare Other

## 2012-11-26 ENCOUNTER — Other Ambulatory Visit (HOSPITAL_COMMUNITY): Payer: Medicare Other

## 2012-11-26 DIAGNOSIS — I10 Essential (primary) hypertension: Secondary | ICD-10-CM | POA: Diagnosis not present

## 2012-11-27 ENCOUNTER — Other Ambulatory Visit (HOSPITAL_COMMUNITY): Payer: Medicare Other

## 2012-12-12 ENCOUNTER — Encounter: Payer: Self-pay | Admitting: Internal Medicine

## 2012-12-15 ENCOUNTER — Ambulatory Visit (HOSPITAL_COMMUNITY): Payer: Medicare Other | Attending: Internal Medicine

## 2012-12-15 ENCOUNTER — Ambulatory Visit (HOSPITAL_COMMUNITY): Admission: EM | Admit: 2012-12-15 | Payer: Medicare Other | Source: Ambulatory Visit | Admitting: Internal Medicine

## 2012-12-15 ENCOUNTER — Non-Acute Institutional Stay (SKILLED_NURSING_FACILITY): Payer: Medicare Other | Admitting: Internal Medicine

## 2012-12-15 DIAGNOSIS — C50919 Malignant neoplasm of unspecified site of unspecified female breast: Secondary | ICD-10-CM

## 2012-12-15 DIAGNOSIS — M79609 Pain in unspecified limb: Secondary | ICD-10-CM | POA: Diagnosis not present

## 2012-12-15 DIAGNOSIS — R1319 Other dysphagia: Secondary | ICD-10-CM | POA: Diagnosis not present

## 2012-12-15 DIAGNOSIS — F068 Other specified mental disorders due to known physiological condition: Secondary | ICD-10-CM | POA: Diagnosis not present

## 2012-12-15 DIAGNOSIS — M810 Age-related osteoporosis without current pathological fracture: Secondary | ICD-10-CM

## 2012-12-15 DIAGNOSIS — I10 Essential (primary) hypertension: Secondary | ICD-10-CM | POA: Diagnosis not present

## 2012-12-17 DIAGNOSIS — I10 Essential (primary) hypertension: Secondary | ICD-10-CM | POA: Diagnosis not present

## 2012-12-18 ENCOUNTER — Ambulatory Visit (HOSPITAL_COMMUNITY)
Admit: 2012-12-18 | Discharge: 2012-12-18 | Disposition: A | Discharge status: Home / Self Care | Payer: Medicare Other | Attending: Internal Medicine | Admitting: Internal Medicine

## 2012-12-18 ENCOUNTER — Ambulatory Visit (HOSPITAL_COMMUNITY)
Admission: RE | Admit: 2012-12-18 | Discharge: 2012-12-18 | Disposition: A | Discharge status: Home / Self Care | Payer: Medicare Other | Source: Ambulatory Visit | Attending: Internal Medicine | Admitting: Internal Medicine

## 2012-12-18 ENCOUNTER — Non-Acute Institutional Stay (SKILLED_NURSING_FACILITY): Payer: Medicare Other | Admitting: Internal Medicine

## 2012-12-18 DIAGNOSIS — S79929A Unspecified injury of unspecified thigh, initial encounter: Secondary | ICD-10-CM | POA: Diagnosis not present

## 2012-12-18 DIAGNOSIS — T148XXA Other injury of unspecified body region, initial encounter: Secondary | ICD-10-CM | POA: Diagnosis not present

## 2012-12-18 DIAGNOSIS — M25559 Pain in unspecified hip: Secondary | ICD-10-CM | POA: Diagnosis not present

## 2012-12-18 DIAGNOSIS — W19XXXA Unspecified fall, initial encounter: Secondary | ICD-10-CM

## 2012-12-18 DIAGNOSIS — S79919A Unspecified injury of unspecified hip, initial encounter: Secondary | ICD-10-CM | POA: Diagnosis not present

## 2012-12-19 DIAGNOSIS — I10 Essential (primary) hypertension: Secondary | ICD-10-CM | POA: Diagnosis not present

## 2012-12-19 DIAGNOSIS — R262 Difficulty in walking, not elsewhere classified: Secondary | ICD-10-CM | POA: Diagnosis not present

## 2012-12-19 DIAGNOSIS — F0390 Unspecified dementia without behavioral disturbance: Secondary | ICD-10-CM | POA: Diagnosis not present

## 2012-12-19 DIAGNOSIS — R41841 Cognitive communication deficit: Secondary | ICD-10-CM | POA: Diagnosis not present

## 2012-12-19 DIAGNOSIS — F068 Other specified mental disorders due to known physiological condition: Secondary | ICD-10-CM | POA: Diagnosis not present

## 2012-12-19 DIAGNOSIS — F319 Bipolar disorder, unspecified: Secondary | ICD-10-CM | POA: Diagnosis not present

## 2012-12-19 DIAGNOSIS — F0391 Unspecified dementia with behavioral disturbance: Secondary | ICD-10-CM | POA: Diagnosis not present

## 2012-12-20 DIAGNOSIS — R41841 Cognitive communication deficit: Secondary | ICD-10-CM | POA: Diagnosis not present

## 2012-12-20 DIAGNOSIS — R262 Difficulty in walking, not elsewhere classified: Secondary | ICD-10-CM | POA: Diagnosis not present

## 2012-12-20 DIAGNOSIS — F319 Bipolar disorder, unspecified: Secondary | ICD-10-CM | POA: Diagnosis not present

## 2012-12-20 DIAGNOSIS — F068 Other specified mental disorders due to known physiological condition: Secondary | ICD-10-CM | POA: Diagnosis not present

## 2012-12-20 DIAGNOSIS — F0391 Unspecified dementia with behavioral disturbance: Secondary | ICD-10-CM | POA: Diagnosis not present

## 2012-12-20 DIAGNOSIS — I10 Essential (primary) hypertension: Secondary | ICD-10-CM | POA: Diagnosis not present

## 2012-12-21 DIAGNOSIS — F068 Other specified mental disorders due to known physiological condition: Secondary | ICD-10-CM | POA: Diagnosis not present

## 2012-12-21 DIAGNOSIS — I10 Essential (primary) hypertension: Secondary | ICD-10-CM | POA: Diagnosis not present

## 2012-12-21 DIAGNOSIS — R262 Difficulty in walking, not elsewhere classified: Secondary | ICD-10-CM | POA: Diagnosis not present

## 2012-12-21 DIAGNOSIS — R41841 Cognitive communication deficit: Secondary | ICD-10-CM | POA: Diagnosis not present

## 2012-12-21 DIAGNOSIS — F0391 Unspecified dementia with behavioral disturbance: Secondary | ICD-10-CM | POA: Diagnosis not present

## 2012-12-21 DIAGNOSIS — F319 Bipolar disorder, unspecified: Secondary | ICD-10-CM | POA: Diagnosis not present

## 2012-12-24 DIAGNOSIS — I10 Essential (primary) hypertension: Secondary | ICD-10-CM | POA: Diagnosis not present

## 2012-12-24 DIAGNOSIS — F319 Bipolar disorder, unspecified: Secondary | ICD-10-CM | POA: Diagnosis not present

## 2012-12-24 DIAGNOSIS — R41841 Cognitive communication deficit: Secondary | ICD-10-CM | POA: Diagnosis not present

## 2012-12-24 DIAGNOSIS — F068 Other specified mental disorders due to known physiological condition: Secondary | ICD-10-CM | POA: Diagnosis not present

## 2012-12-24 DIAGNOSIS — F0391 Unspecified dementia with behavioral disturbance: Secondary | ICD-10-CM | POA: Diagnosis not present

## 2012-12-24 DIAGNOSIS — R262 Difficulty in walking, not elsewhere classified: Secondary | ICD-10-CM | POA: Diagnosis not present

## 2012-12-25 DIAGNOSIS — I10 Essential (primary) hypertension: Secondary | ICD-10-CM | POA: Diagnosis not present

## 2012-12-25 DIAGNOSIS — R262 Difficulty in walking, not elsewhere classified: Secondary | ICD-10-CM | POA: Diagnosis not present

## 2012-12-25 DIAGNOSIS — F068 Other specified mental disorders due to known physiological condition: Secondary | ICD-10-CM | POA: Diagnosis not present

## 2012-12-25 DIAGNOSIS — F319 Bipolar disorder, unspecified: Secondary | ICD-10-CM | POA: Diagnosis not present

## 2012-12-25 DIAGNOSIS — R41841 Cognitive communication deficit: Secondary | ICD-10-CM | POA: Diagnosis not present

## 2012-12-25 DIAGNOSIS — F0391 Unspecified dementia with behavioral disturbance: Secondary | ICD-10-CM | POA: Diagnosis not present

## 2012-12-26 DIAGNOSIS — R262 Difficulty in walking, not elsewhere classified: Secondary | ICD-10-CM | POA: Diagnosis not present

## 2012-12-26 DIAGNOSIS — I10 Essential (primary) hypertension: Secondary | ICD-10-CM | POA: Diagnosis not present

## 2012-12-26 DIAGNOSIS — R41841 Cognitive communication deficit: Secondary | ICD-10-CM | POA: Diagnosis not present

## 2012-12-26 DIAGNOSIS — F0391 Unspecified dementia with behavioral disturbance: Secondary | ICD-10-CM | POA: Diagnosis not present

## 2012-12-26 DIAGNOSIS — F319 Bipolar disorder, unspecified: Secondary | ICD-10-CM | POA: Diagnosis not present

## 2012-12-26 DIAGNOSIS — F068 Other specified mental disorders due to known physiological condition: Secondary | ICD-10-CM | POA: Diagnosis not present

## 2012-12-27 DIAGNOSIS — F0391 Unspecified dementia with behavioral disturbance: Secondary | ICD-10-CM | POA: Diagnosis not present

## 2012-12-27 DIAGNOSIS — F068 Other specified mental disorders due to known physiological condition: Secondary | ICD-10-CM | POA: Diagnosis not present

## 2012-12-27 DIAGNOSIS — R41841 Cognitive communication deficit: Secondary | ICD-10-CM | POA: Diagnosis not present

## 2012-12-27 DIAGNOSIS — I10 Essential (primary) hypertension: Secondary | ICD-10-CM | POA: Diagnosis not present

## 2012-12-27 DIAGNOSIS — R262 Difficulty in walking, not elsewhere classified: Secondary | ICD-10-CM | POA: Diagnosis not present

## 2012-12-27 DIAGNOSIS — F319 Bipolar disorder, unspecified: Secondary | ICD-10-CM | POA: Diagnosis not present

## 2012-12-28 DIAGNOSIS — F068 Other specified mental disorders due to known physiological condition: Secondary | ICD-10-CM | POA: Diagnosis not present

## 2012-12-28 DIAGNOSIS — F319 Bipolar disorder, unspecified: Secondary | ICD-10-CM | POA: Diagnosis not present

## 2012-12-28 DIAGNOSIS — R262 Difficulty in walking, not elsewhere classified: Secondary | ICD-10-CM | POA: Diagnosis not present

## 2012-12-28 DIAGNOSIS — R41841 Cognitive communication deficit: Secondary | ICD-10-CM | POA: Diagnosis not present

## 2012-12-28 DIAGNOSIS — I10 Essential (primary) hypertension: Secondary | ICD-10-CM | POA: Diagnosis not present

## 2012-12-28 DIAGNOSIS — F0391 Unspecified dementia with behavioral disturbance: Secondary | ICD-10-CM | POA: Diagnosis not present

## 2012-12-31 DIAGNOSIS — F319 Bipolar disorder, unspecified: Secondary | ICD-10-CM | POA: Diagnosis not present

## 2012-12-31 DIAGNOSIS — R41841 Cognitive communication deficit: Secondary | ICD-10-CM | POA: Diagnosis not present

## 2012-12-31 DIAGNOSIS — R262 Difficulty in walking, not elsewhere classified: Secondary | ICD-10-CM | POA: Diagnosis not present

## 2012-12-31 DIAGNOSIS — F068 Other specified mental disorders due to known physiological condition: Secondary | ICD-10-CM | POA: Diagnosis not present

## 2012-12-31 DIAGNOSIS — I10 Essential (primary) hypertension: Secondary | ICD-10-CM | POA: Diagnosis not present

## 2012-12-31 DIAGNOSIS — F0391 Unspecified dementia with behavioral disturbance: Secondary | ICD-10-CM | POA: Diagnosis not present

## 2013-01-01 DIAGNOSIS — R262 Difficulty in walking, not elsewhere classified: Secondary | ICD-10-CM | POA: Diagnosis not present

## 2013-01-01 DIAGNOSIS — F068 Other specified mental disorders due to known physiological condition: Secondary | ICD-10-CM | POA: Diagnosis not present

## 2013-01-01 DIAGNOSIS — F0391 Unspecified dementia with behavioral disturbance: Secondary | ICD-10-CM | POA: Diagnosis not present

## 2013-01-01 DIAGNOSIS — R41841 Cognitive communication deficit: Secondary | ICD-10-CM | POA: Diagnosis not present

## 2013-01-01 DIAGNOSIS — F319 Bipolar disorder, unspecified: Secondary | ICD-10-CM | POA: Diagnosis not present

## 2013-01-01 DIAGNOSIS — I10 Essential (primary) hypertension: Secondary | ICD-10-CM | POA: Diagnosis not present

## 2013-01-21 DIAGNOSIS — B351 Tinea unguium: Secondary | ICD-10-CM | POA: Diagnosis not present

## 2013-02-02 NOTE — Progress Notes (Signed)
Patient ID: [name], female   DOB: Sep 16, 1935, 77 y.o.   MRN: [id number] This is a routine visit.  Level of care skilled.  Facility [facility]   Chief complaint-medical management of gastroparesis with dysphagia-dementia with bipolar disorder hypertension history of breast cancer status post lumpectomy.-  History of present illness.  Patient is a pleasant long-term resident at this facility with the above diagnosis-she continues to be quite stable.  She's had some acute issues were her creatinine would rise in white count would go up as well-this responded to antibiotics and fluids although no clear reason for this was found-nonetheless she has been stable now for some time.  She does have a history of gastroparesis with dysphagia had been on higher dose Zofran as well as Reglan 5 mg 4 times a day-this was recently reduced 2.5 mg 4 times a day and Zofran was reduced to 4 mg 4 times a day-despite dose reduction she is doing well with this--she is eating relatively well and has gained some weight. In fact yesterday the Reglan was completely discontinued--  d.  She does have dementia with bipolar disorder-this appears to be stable she is on Depakote which appears to help also has Ativan when necessary but does not take this a lot.  She also has a history of right breast cancer status post lumpectomy-this is followed by oncology she continues on tamoxifen.  Also a history of right arm fracture her recoveries been unremarkable he had been on calcium with vitamin D however calcium level was somewhat elevated calcium as been discontinued.  Hypertension appears stable on Toprol --recent blood pressures 128/60-142/80.  She has a history of renal insufficiency recent creatinine was 1.81 which is relatively near the higher end of her baseline  .  Family medical social history as been reviewed per history and physical on 05/19/2010.   Medications as been reviewed per MAR  .  Review of systems quite limited  secondary to dementia please see history of present illness  She is not complaining of any shortness of breath chest pain continues to ambulate about the facility at her baseline-pleasantly confused nursing staff has not noted any acute issues-again there's been the question of some increased edema although this does not appear to be apparent today.  [name]  Physical exam.  She is afebrile pulse is 82 respirations 20 blood pressure 124/62.  Weight 153 she has gradually gained weight again there's been some variability as noted above  .  In general this is a somewhat frail elderly female in no distress she is pleasantly confused smiling.  Her skin is warm and dry.  Eyes pupils appear equal round reactive to light sclera and conjunctiva are clear visual acuity appears grossly intact.  Mouth she has numerous extractions I mucous membranes are moist.  .  Chest is clear to auscultation without rhonchi rales or wheezes  Heart is regular rate and rhythm without murmur gallop or rub she does not really have much lower extremity edema I would say trace--this is essentially unchanged from previous exams  .  Abdomen soft nontender with active bowel sounds.  Muscle skeletal-ambulates about the facility at baseline was all extremities x4 I do not note any deformities here --there is a small bruise dorsal aspect of the left foot .  Neurologic appears grossly intact her speech is clear I do not see any lateralizing findings  .  Psych she is oriented to self only continues to be pleasantly confused  .  Labs  11/26/2012.  Sodium 145 potassium 4.2 BUN 31 creatinine 1.81.  .  09/25/2012.  WBC 8.9 hemoglobin 13.6 platelets 229  Sodium 141 potassium 4.3 BUN 26 creatinine 1.54.  Liver function tests within normal limits I note an albumin of 3.6.  09/25/2012.  Vitamin D level-31.  09/24/2012.  Depakote level-41.0  07/25/2012.  WBC 5.8 hemoglobin 11.3 platelets 226.  Sodium 135 potassium 3.9 BUN 28  creatinine 1.72 calcium 9.1  05/31/2012.  Valproic acid level 35.1  02/15/2012.  WBC 4.8 hemoglobin 12.3 platelets 184.  Sodium 142 potassium 4 BUN 20 creatinine 1.58.  Liver function tests within normal limits except albumin of 3.3-bilirubin 0.2 .  Assessment and plan.  #1-history of dysphasia-gastroparesis-this is stable despite reduction of Reglan continues on Zofran as well also on Prilosec-this appears to be stable continue to monitor she is gaining weight -Reglan has been DC'd as of yesterday will monitor  #2-hypertension-this appears to be stable on Toprol.  #3--edema-? I do not really note much today she appears to be at her baseline  #4-history of breast cancer-continues on tamoxifen she is followed by oncology.  #5-history of hypernatremia renal insufficiency-Will update BMP.  #6-history of dementia with bipolar disorder-this appears stable on Depakote--Will update Depakote level .  #7 history of right arm fracture h as done well calcium discontinued because of borderline high calcium on recent metabolic panel-this appears to have normalized .  #8-anemia-this appears relatively Baseline suspect an element of chronic disease. Will update CBC #9-constipation-this appears to be stable on Senokot  #10-bruising left foot-do not see a listed history of trauma however family is concerned and we will order an x-ray to rule out any acute pathology .  [id number] note greater than 30 minutes spent assessing patient and formulating a plan of care for numerous diagnoses  .  [name]

## 2013-02-03 DIAGNOSIS — R269 Unspecified abnormalities of gait and mobility: Secondary | ICD-10-CM | POA: Insufficient documentation

## 2013-02-03 DIAGNOSIS — W19XXXA Unspecified fall, initial encounter: Secondary | ICD-10-CM | POA: Insufficient documentation

## 2013-02-03 NOTE — Progress Notes (Signed)
Patient ID: [name], female   DOB: 10-17-1935, 77 y.o.   MRN: [id number]  This is an acute visit.  Level care skilled.  Facility as [facility].  Date is 12/18/2012.  Chief complaint-acute visit status post fall.  History of present illness.  Patient is a pleasant elderly resident with a history of significant dementia who does ambulate about the facility.  She was found sitting on the floor in her room --her roommate states that she missed the chair when she was trying to sit on it and landed on her bottom.  Patient does not complaining of any significant pain but there is a small bruise to her right buttocks.  It appears she did not hit her head although not totally clear.  Her vital signs are stable there was no loss of consciousness apparently.  Family medical social history as been reviewed most recently progress note on 09/20/2012.  Medications have been reviewed per MAR.  She is on Depakote for behaviors.  Review of systems-patient is not really complaining of any headache dizziness or pain at this time she is a poor historian secondary to dementia.  Physical exam.  She is afebrile pulse 98 respirations 20 blood pressure 116/82.  In general this is a pleasant somewhat frail elderly female in no distress she is able to stand with assistance and does not complaining of pain in the hip area.  Her skin is warm and dry there is a small bruise on her right buttocks.  Heart is regular rate and rhythm. Advised facility to follow her p  Chest is clear to auscultation with no labored breathing.  Muscle skeletal-appears to move her extremities at baseline is able to stand and ambulate at her baseline-I do not note any deformities her grip strength appears to be intact and strength in all extremities appears to be intact.  I do not note any deformity of the hip area I also do not note any bruising hematomas of her head.  Neurologic-appears to be grossly intact her pupils are  equal round reactive to light extraocular movements intact cranial nerves intact I do not see any lateralizing findings    Labs.  12/17/2012.  WBC 6.1 hemoglobin 11.7 platelets 193.  Sodium 146 potassium 4.2 UA 28 creatinine 1.90.  Liver function tests within normal limits.  09/24/2012.  Depakote level XLI.  Assessment and plan.  #1-status post fall-facility to follow there for protocol including neural checks.  Also will x-ray the right hip secondary to the bruise her power of attorney as approve this.  #2-history of hypernatremia renal insufficiency we'll update labs I think these are pending.  [id number]

## 2013-02-05 NOTE — Progress Notes (Signed)
Patient ID: [name], female   DOB: 02-17-1936, 77 y.o.   MRN: [id number] ADDENDUM--- Primary code --hematoma--

## 2013-02-18 ENCOUNTER — Encounter (HOSPITAL_COMMUNITY): Payer: Self-pay | Admitting: Oncology

## 2013-02-18 NOTE — Progress Notes (Signed)
April Brightly, MD [address] [place] [address]  BREAST CANCER - Plan: CBC with Differential, Comprehensive metabolic panel, Cancer antigen 27.29  Osteoporosis - Plan: DG Bone Density, Comprehensive metabolic panel  CURRENT THERAPY: Tamoxifen 10 mg BID beginning in July 2012, which she will take for 5 years.  Additionally, she is on Prolia 60 mg SQ every 6 months for osteoporosis.  INTERVAL HISTORY: [name] 77 y.o. female returns for  regular  visit for followup of Right sided breast invasive ductal carcinoma, ER/PR + Her -2- neu negative. yPT3,ypNX yp Mx .  Per notes form Dr Neijstrom;Right-sided breast cancer upper outer quadrant with progression on Arimidex, status post a limited segmental mastectomy by Dr. Zella Richer in July 2012 at which time we switched her to tamoxifen 10 mg b.i.d.  She will take Tamoxifen daily x 5 years finishing in July 2017.  Unfortunately, the patient fell at the [facility] in August 2014, but xrays were negative for any fracture.   I personally reviewed and went over laboratory results with the patient.  Labs from May 2014, shows a CBC WNL and metabolic panel that demonstrates renal disease.   [name] is pleasantly confused and does not actively particuipate well in discussuion.  She denies any complaints and oncologic ROS questioning is negative.    Her daughter who accompanied her to this visit requests we D/C her fluid order that was written by Dr. Alinda Deem as she drinks more PO fluids in 24 hours than he ordered and she was fearful that fluids may be denied to her mother if she surpasses that ordered amount.  Since her daughter is a dietician her at the hospital, I respect this decision and agree with it.  Therefore, the order was written to D/C drinking 1-1.5 L of fluid.  Past Medical History  Diagnosis Date  . Dementia   . Gastroparalysis   . HTN (hypertension)   . Gallstones   . Reflux   . Cancer   . Osteoporosis  11/18/2011  . Infiltrating ductal carcinoma of right female breast 12/02/2009    Qualifier: History of  By: [name] CMA [name]), June      has Infiltrating ductal carcinoma of right female breast; DEMENTIA; WEIGHT LOSS-ABNORMAL; Nausea with vomiting; DYSPHAGIA; Wrist fracture; Osteoporosis; HTN (hypertension); Dementia; Unspecified constipation; Edema; Abnormality of gait; and Fall at nursing home on her problem list.     is allergic to aricept and lithium.  [name] does not currently have medications on file.  Past Surgical History  Procedure Laterality Date  . Tonsillectomy and adenoidectomy    . Breast biopsy    . Breast lumpectomy      Denies any headaches, dizziness, double vision, fevers, chills, night sweats, nausea, vomiting, diarrhea, constipation, chest pain, heart palpitations, shortness of breath, blood in stool, black tarry stool, urinary pain, urinary burning, urinary frequency, hematuria.   PHYSICAL EXAMINATION  ECOG PERFORMANCE STATUS: 2 - Symptomatic, <50% confined to bed  Filed Vitals:   02/19/13 1206  BP: 131/74  Pulse: 48    GENERAL:no distress, comfortable, cooperative, smiling and pleasantly confused SKIN: skin color, texture, turgor are normal, no rashes or significant lesions HEAD: Normocephalic, No masses, lesions, tenderness or abnormalities EYES: normal, PERRLA, EOMI, Conjunctiva are pink and non-injected EARS: External ears normal OROPHARYNX:mucous membranes are moist  NECK: supple, no adenopathy, trachea midline LYMPH:  no palpable lymphadenopathy BREAST:not examined LUNGS: clear to auscultation  HEART: regular rate & rhythm, no murmurs, no  gallops, S1 normal and S2 normal ABDOMEN:abdomen soft, non-tender and normal bowel sounds BACK: Back symmetric, no curvature. EXTREMITIES:less then 2 second capillary refill, no skin discoloration, no cyanosis  NEURO: no focal motor/sensory deficits, pleasantly confused, in wheelchair    LABORATORY  DATA: CBC    Component Value Date/Time   WBC 8.9 09/25/2012 1102   WBC 6.7 08/11/2010 1448   RBC 4.61 09/25/2012 1102   RBC 3.80 08/11/2010 1448   HGB 13.6 09/25/2012 1102   HGB 11.3* 08/11/2010 1448   HCT 43.3 09/25/2012 1102   HCT 34.0* 08/11/2010 1448   PLT 229 09/25/2012 1102   PLT 240 08/11/2010 1448   MCV 93.9 09/25/2012 1102   MCV 89.4 08/11/2010 1448   MCH 29.5 09/25/2012 1102   MCH 29.7 08/11/2010 1448   MCHC 31.4 09/25/2012 1102   MCHC 33.2 08/11/2010 1448   RDW 12.9 09/25/2012 1102   RDW 13.6 08/11/2010 1448   LYMPHSABS 2.1 09/25/2012 1102   LYMPHSABS 1.8 08/11/2010 1448   MONOABS 0.6 09/25/2012 1102   MONOABS 0.5 08/11/2010 1448   EOSABS 0.1 09/25/2012 1102   EOSABS 0.1 08/11/2010 1448   BASOSABS 0.0 09/25/2012 1102   BASOSABS 0.0 08/11/2010 1448      Chemistry      Component Value Date/Time   NA 141 09/25/2012 1102   K 4.3 09/25/2012 1102   CL 105 09/25/2012 1102   CO2 24 09/25/2012 1102   BUN 26* 09/25/2012 1102   CREATININE 1.54* 09/25/2012 1102      Component Value Date/Time   CALCIUM 9.4 09/25/2012 1102   CALCIUM 10.1 05/17/2007 2230   ALKPHOS 57 09/25/2012 1102   AST 18 09/25/2012 1102   ALT 11 09/25/2012 1102   BILITOT 0.3 09/25/2012 1102     Lab Results  Component Value Date   LABCA2 36 10/30/2012      RADIOGRAPHIC STUDIES:  12/18/2012  *RADIOLOGY REPORT*  Clinical Data: Fall. Right hip pain.  RIGHT HIP - COMPLETE 2+ VIEW  Comparison: None.  Findings: No fracture or dislocation. The hip joints normally  spaced and aligned. There are minor osteophytes along the bases of  the femoral heads. No other hip degenerative change. The bony  pelvis is intact. There is a calcification in the central pelvis  that is likely within the uterine fibroid.  IMPRESSION:  No fracture or dislocation or acute finding.  Original Report Authenticated By: Lajean Manes, M.D.      ASSESSMENT:  1. Right sided breast invasive ductal carcinoma, ER/PR + Her -2- neu negative. yPT3,ypNX yp Mx .  Per  notes form Dr Neijstrom;Right-sided breast cancer upper outer quadrant with progression on Arimidex, status post a limited segmental mastectomy by Dr. Zella Richer in July 2012 at which time we switched her to tamoxifen 10 mg b.i.d. Which she will take for 5 years.  2. Osteoporosis, on Ca++, Vit D, and Prolia every 6 months.  3. Recent fall at [facility] in August 2014, negative fracture. 4. Dementia  Patient Active Problem List   Diagnosis Date Noted  . Abnormality of gait 02/03/2013  . Fall at nursing home 02/03/2013  . Edema 11/13/2012  . HTN (hypertension) 09/20/2012  . Dementia 09/20/2012  . Unspecified constipation 09/20/2012  . Osteoporosis 11/18/2011  . Wrist fracture 10/07/2010  . WEIGHT LOSS-ABNORMAL 12/03/2009  . Nausea with vomiting 12/03/2009  . DYSPHAGIA 12/03/2009  . Infiltrating ductal carcinoma of right female breast 12/02/2009  . DEMENTIA 12/02/2009     PLAN:  1. I  personally reviewed and went over laboratory results with the patient. 2. I personally reviewed and went over radiographic studies with the patient. 3. Next bone density is due in August 2015.  Order placed.  4. Labs today: CBC diff, CMET, CA 27.29 5. Prolia injection today or in the near future. 6. Supportive therapy plan, Prolia reviewed and updated 7. Influenza already given 8. Return in 5 months for follow-up.   THERAPY PLAN:  She is tolerating Tamoxifen 10 mg BID well.  She will take this for 5 years, beginning in July 2012.  From an osteoporosis standpoint, we will continue with Ca++, Vit D, and Prolia every 6 months but Tamoxifen should not worsen bone density.  All questions were answered. The patient knows to call the clinic with any problems, questions or concerns. We can certainly see the patient much sooner if necessary.  Patient and plan discussed with Dr. Farrel Gobble and he is in agreement with the aforementioned.   Loa Idler

## 2013-02-19 ENCOUNTER — Encounter (HOSPITAL_COMMUNITY): Payer: Medicare Other | Attending: Hematology and Oncology | Admitting: Oncology

## 2013-02-19 ENCOUNTER — Encounter (HOSPITAL_COMMUNITY): Payer: Self-pay | Admitting: Oncology

## 2013-02-19 VITALS — BP 131/74 | HR 48 | Wt 146.0 lb

## 2013-02-19 DIAGNOSIS — F0390 Unspecified dementia without behavioral disturbance: Secondary | ICD-10-CM

## 2013-02-19 DIAGNOSIS — M810 Age-related osteoporosis without current pathological fracture: Secondary | ICD-10-CM | POA: Insufficient documentation

## 2013-02-19 DIAGNOSIS — C50419 Malignant neoplasm of upper-outer quadrant of unspecified female breast: Secondary | ICD-10-CM

## 2013-02-19 DIAGNOSIS — C50919 Malignant neoplasm of unspecified site of unspecified female breast: Secondary | ICD-10-CM | POA: Insufficient documentation

## 2013-02-19 DIAGNOSIS — Z170 Estrogen receptor positive status [ER+]: Secondary | ICD-10-CM | POA: Diagnosis not present

## 2013-02-19 LAB — CBC WITH DIFFERENTIAL/PLATELET
Eosinophils Absolute: 0.1 10*3/uL (ref 0.0–0.7)
Eosinophils Relative: 2 % (ref 0–5)
HCT: 40.5 % (ref 36.0–46.0)
Hemoglobin: 13.2 g/dL (ref 12.0–15.0)
Lymphs Abs: 1.8 10*3/uL (ref 0.7–4.0)
MCH: 30.4 pg (ref 26.0–34.0)
MCV: 93.3 fL (ref 78.0–100.0)
Monocytes Absolute: 0.5 10*3/uL (ref 0.1–1.0)
Monocytes Relative: 8 % (ref 3–12)
RBC: 4.34 MIL/uL (ref 3.87–5.11)

## 2013-02-19 LAB — COMPREHENSIVE METABOLIC PANEL
BUN: 24 mg/dL — ABNORMAL HIGH (ref 6–23)
Calcium: 9.3 mg/dL (ref 8.4–10.5)
GFR calc Af Amer: 33 mL/min — ABNORMAL LOW (ref >90)
Glucose, Bld: 72 mg/dL (ref 70–99)
Total Protein: 7 g/dL (ref 6.0–8.3)

## 2013-02-19 NOTE — Patient Instructions (Signed)
[facility] Discharge Instructions  RECOMMENDATIONS MADE BY THE CONSULTANT AND ANY TEST RESULTS WILL BE SENT TO YOUR REFERRING PHYSICIAN.  EXAM FINDINGS BY THE PHYSICIAN TODAY AND SIGNS OR SYMPTOMS TO REPORT TO CLINIC OR PRIMARY PHYSICIAN: Exam and findings as discussed by Robynn Pane, PA-C.  No changes in therapy. Report any new lumps, bone pain, shortness of breath or other symptoms.  MEDICATIONS PRESCRIBED:  Continue tamoxifen.  INSTRUCTIONS/FOLLOW-UP: Prolia injection on Thursday and follow-up in 5 months.  Thank you for choosing [facility] to provide your oncology and hematology care.  To afford each patient quality time with our providers, please arrive at least 15 minutes before your scheduled appointment time.  With your help, our goal is to use those 15 minutes to complete the necessary work-up to ensure our physicians have the information they need to help with your evaluation and healthcare recommendations.    Effective January 1st, 2014, we ask that you re-schedule your appointment with our physicians should you arrive 10 or more minutes late for your appointment.  We strive to give you quality time with our providers, and arriving late affects you and other patients whose appointments are after yours.    Again, thank you for choosing [facility].  Our hope is that these requests will decrease the amount of time that you wait before being seen by our physicians.       _____________________________________________________________  Should you have questions after your visit to [facility], please contact our office at [phone] between the hours of 8:30 a.m. and 5:00 p.m.  Voicemails left after 4:30 p.m. will not be returned until the following business day.  For prescription refill requests, have your pharmacy contact our office with your prescription refill request.

## 2013-02-20 LAB — CANCER ANTIGEN 27.29: CA 27.29: 37 U/mL (ref 0–39)

## 2013-02-21 ENCOUNTER — Encounter (HOSPITAL_BASED_OUTPATIENT_CLINIC_OR_DEPARTMENT_OTHER): Payer: Medicare Other

## 2013-02-21 DIAGNOSIS — M810 Age-related osteoporosis without current pathological fracture: Secondary | ICD-10-CM | POA: Diagnosis not present

## 2013-02-21 MED ORDER — DENOSUMAB 60 MG/ML ~~LOC~~ SOLN
60.0000 mg | Freq: Once | SUBCUTANEOUS | Status: AC
  Administered 2013-02-21: 60 mg via SUBCUTANEOUS
  Filled 2013-02-21: qty 1

## 2013-02-21 NOTE — Progress Notes (Signed)
[name] presents today for injection per MD orders. prolia 60 mg administered SQ in right Abdomen. Administration without incident. Patient tolerated well.

## 2013-03-12 ENCOUNTER — Non-Acute Institutional Stay (SKILLED_NURSING_FACILITY): Payer: Medicare Other | Admitting: Internal Medicine

## 2013-03-12 DIAGNOSIS — R509 Fever, unspecified: Secondary | ICD-10-CM | POA: Insufficient documentation

## 2013-03-12 DIAGNOSIS — C50911 Malignant neoplasm of unspecified site of right female breast: Secondary | ICD-10-CM

## 2013-03-12 DIAGNOSIS — I10 Essential (primary) hypertension: Secondary | ICD-10-CM | POA: Diagnosis not present

## 2013-03-12 DIAGNOSIS — R1319 Other dysphagia: Secondary | ICD-10-CM | POA: Diagnosis not present

## 2013-03-12 DIAGNOSIS — R609 Edema, unspecified: Secondary | ICD-10-CM

## 2013-03-12 DIAGNOSIS — M810 Age-related osteoporosis without current pathological fracture: Secondary | ICD-10-CM

## 2013-03-12 DIAGNOSIS — C50919 Malignant neoplasm of unspecified site of unspecified female breast: Secondary | ICD-10-CM | POA: Diagnosis not present

## 2013-03-12 DIAGNOSIS — F0391 Unspecified dementia with behavioral disturbance: Secondary | ICD-10-CM

## 2013-03-12 DIAGNOSIS — N390 Urinary tract infection, site not specified: Secondary | ICD-10-CM | POA: Diagnosis not present

## 2013-03-12 DIAGNOSIS — D649 Anemia, unspecified: Secondary | ICD-10-CM | POA: Diagnosis not present

## 2013-03-12 DIAGNOSIS — R634 Abnormal weight loss: Secondary | ICD-10-CM

## 2013-03-12 NOTE — Progress Notes (Signed)
Patient ID: [name], female   DOB: August 28, 1935, 77 y.o.   MRN: [id number]                       Chief complaint-medical management of gastroparesis with dysphagia-dementia with bipolar disorder hypertension history of breast cancer status post lumpectomy.--- Acute visit secondary to fever of unknown origin patient does not appearing to be her self   History of present illness.   Patient is a pleasant long-term resident at this facility with the above diagnosis--she hae been pretty stable--however this afternoon her nurse thought that she just wasn't herself wasn't as active she did take her vital signs in her temperature was somewhat elevated at 98.9 axillary otherwise her vital signs were stable as well as O2 saturation  Apparently she ate well earlier today-but now appears to be somewhat weaker although she has no acute complaints of pain or shortness of breath although she is quite a poor historian secondary to her dementia.   She's had some acute issues in the past where her creatinine would rise and white count would go up as well-this responded to antibiotics and fluids although no clear reason for this was found-nonetheless this stablized   She does have a history of gastroparesis with dysphagia had been on higher dose Zofran as well as Reglan 5 mg 4 times a day-this was recently reduced and then discontinued and Zofran was reduced to 4 mg 4 times a day-despite dose reduction she is doing well with this--she is eating relatively well although if recent weight is to be believed she's lost about 10 pounds the past several months-   d.   She does have dementia with bipolar disorder-this appears to be stable she is on Depakote which appears to help also has Ativan when necessary but does not take this a lot.   She also has a history of right breast cancer status post lumpectomy-this is followed by oncology she continues on tamoxifen--she had  just seen oncology a couple weeks ago.    Also a history of right arm fracture her recoveries been unremarkable he had been on calcium with vitamin D however calcium level was somewhat elevated and calcium was discontinued for a while-this has been restarted she appears to be tolerating this well with normal calcium levels recently.   Hypertension appears stable on Toprol --recent blood pressures 128/77-124/74  I see one listed as 90/71 but this appears to be an anomaly.   She has a history of renal insufficiency recent creatinine was 1.71 which is relatively near the  her baseline   .    Family medical social history as been reviewed per history and physical on 05/19/2010.    Medications as been reviewed per MAR   .   Review of systems quite limited secondary to dementia please see history of present illness    .   [name]   Physical exam.    Temperature 98.9 axillary pulse 80 respirations 18 O2 saturation 95% on room air the pressure 128/77 In general this is a somewhat frail elderly female in no distress --however she appears somewhat weaker than her normal self is not smiling does not appear to be feeling well.   Her skin is warm and dry--possibly  slightly  more warm to touch than usual however it is relatively warm in the room.   Eyes pupils appear equal round reactive to light sclera and conjunctiva are clear visual acuity appears grossly intact.  Mouth she has numerous extractions  mucous membranes are moist.   .   Chest is clear to auscultation without rhonchi rales or wheezes--iON poor respiratory effort   Heart is regular rate and rhythm without murmur gallop or rub she does not really have much lower extremity edema I would say trace--this is essentially unchanged from previous exams   .   Abdomen soft nontender with active bowel sounds GU-some mild suprapubic tenderness I do not see any discharge or drainage.   Muscle skeletal-a Is moving all extremities x4-however she appears somewhat weaker not feeling as well not up  and ambulating as she usually is .   Neurologic appears grossly intact her speech is clear I do not see any lateralizing findings   .   Psych she is oriented to self only --not smiling or talking as much today as usual   .   Labs   on the 14th 2014.  Sodium 141 potassium 4.9 BUN 31 creatinine 1.71.  The Port-A-Cath and 59.  12/17/2012.  Liver function tests within normal limits except albumin of 3.1.  CBC 6.1 hemoglobin 11.7 platelets 193.     11/26/2012.   Sodium 145 potassium 4.2 BUN 31 creatinine 1.81.   .   09/25/2012.   WBC 8.9 hemoglobin 13.6 platelets 229   Sodium 141 potassium 4.3 BUN 26 creatinine 1.54.   Liver function tests within normal limits I note an albumin of 3.6.   09/25/2012.   Vitamin D level-31.   09/24/2012.   Depakote level-41.0   07/25/2012.   WBC 5.8 hemoglobin 11.3 platelets 226.   Sodium 135 potassium 3.9 BUN 28 creatinine 1.72 calcium 9.1   05/31/2012.   Valproic acid level 35.1   02/15/2012.   WBC 4.8 hemoglobin 12.3 platelets 184.   Sodium 142 potassium 4 BUN 20 creatinine 1.58.   Liver function tests within normal limits except albumin of 3.3-bilirubin 0.2 .   Assessment and plan.   #1-fever of unknown origin-this is a somewhat slight fever but clinically patient does appear to be weaker and not herself-at this point will order stat blood work including a CBC with differential and metabolic panel at time she is presented like this when her sodium is high-also will obtain a UA CNS and monitor closely vital signs    #2-hypertension-this appears to be stable on Toprol.   #3--edema-? I do not really note much today she appears to be at her baseline   #4-history of breast cancer-continues on tamoxifen she is followed by oncology.   #5-history of hypernatremia renal insufficiency-Will update BMP.   #6-history of dementia with bipolar disorder-this appears stable on Depakote--l .   #7 history of right arm fracture --her calcium has been  restarted she appears to be doing okay recent calcium was within normal limits  .   #8-anemia-this appears relatively Baseline suspect an element of chronic disease. Will update CBC #9-constipation-this appears to be stable on Senokot  10-weight loss-her daughter actually is the dietitian in the facility-will discuss this with her I suspect this has been a gradual loss and possibly a scale variation #11-dysphagia-as above medications changes have been made apparently her appetite continues to be pretty good does not appear gastroparesis has been an issue recently   Addendum.  Later leaving I did reassess patient and obtain updated labs which show.  WBCs minimally elevated at 10.8 with granulocytes minimally elevated at 7.3.  Hemoglobin 14.0 platelets 178.  Metabolic panel showed a normal sodium of 135  potassium 4.3 BUN 44 creatinine 1.8.  Liver function tests within normal limits I note calcium of 9.6.  Reassess her she actually appears to be doing clinically a bit better.  She is now smiling seems to be more herself her vital signs continued to be stable.  Her chest continues to be clear heart is regular rate and rhythm back does not appear to really have any abdominal discomfort-neurologically she is intact again she is smiling looks to be doing better Will await urine culture results and continue to monitor vital signs closely-every 4 hours x2 and then every shift   [id number]-of note greater than 35 minutes spent assessing patient-and reassessing patient-as well as coordinating and formulating a plan of care with nursing input          .   [name]

## 2013-03-14 ENCOUNTER — Emergency Department (HOSPITAL_COMMUNITY): Payer: Medicare Other

## 2013-03-14 ENCOUNTER — Encounter (HOSPITAL_COMMUNITY): Payer: Self-pay | Admitting: Emergency Medicine

## 2013-03-14 ENCOUNTER — Non-Acute Institutional Stay (SKILLED_NURSING_FACILITY): Payer: Medicare Other | Admitting: Internal Medicine

## 2013-03-14 ENCOUNTER — Other Ambulatory Visit: Payer: Self-pay

## 2013-03-14 ENCOUNTER — Emergency Department (HOSPITAL_COMMUNITY)
Admission: EM | Admit: 2013-03-14 | Discharge: 2013-03-14 | Disposition: A | Discharge status: Home / Self Care | Payer: Medicare Other | Attending: Emergency Medicine | Admitting: Emergency Medicine

## 2013-03-14 ENCOUNTER — Inpatient Hospital Stay
Admission: RE | Admit: 2013-03-14 | Discharge: 2013-03-20 | Disposition: A | Discharge status: Other Acute Inpatient Hospital | Payer: Medicaid Other | Source: Ambulatory Visit | Attending: Internal Medicine | Admitting: Internal Medicine

## 2013-03-14 DIAGNOSIS — I10 Essential (primary) hypertension: Secondary | ICD-10-CM | POA: Diagnosis not present

## 2013-03-14 DIAGNOSIS — R9431 Abnormal electrocardiogram [ECG] [EKG]: Secondary | ICD-10-CM | POA: Insufficient documentation

## 2013-03-14 DIAGNOSIS — K219 Gastro-esophageal reflux disease without esophagitis: Secondary | ICD-10-CM | POA: Diagnosis not present

## 2013-03-14 DIAGNOSIS — R4182 Altered mental status, unspecified: Secondary | ICD-10-CM | POA: Diagnosis not present

## 2013-03-14 DIAGNOSIS — Z7982 Long term (current) use of aspirin: Secondary | ICD-10-CM | POA: Diagnosis not present

## 2013-03-14 DIAGNOSIS — D72829 Elevated white blood cell count, unspecified: Secondary | ICD-10-CM | POA: Diagnosis not present

## 2013-03-14 DIAGNOSIS — G9389 Other specified disorders of brain: Secondary | ICD-10-CM | POA: Diagnosis not present

## 2013-03-14 DIAGNOSIS — R509 Fever, unspecified: Secondary | ICD-10-CM | POA: Insufficient documentation

## 2013-03-14 DIAGNOSIS — R410 Disorientation, unspecified: Secondary | ICD-10-CM

## 2013-03-14 DIAGNOSIS — F29 Unspecified psychosis not due to a substance or known physiological condition: Secondary | ICD-10-CM | POA: Diagnosis not present

## 2013-03-14 DIAGNOSIS — F0390 Unspecified dementia without behavioral disturbance: Secondary | ICD-10-CM | POA: Diagnosis not present

## 2013-03-14 DIAGNOSIS — R52 Pain, unspecified: Secondary | ICD-10-CM

## 2013-03-14 DIAGNOSIS — Z8739 Personal history of other diseases of the musculoskeletal system and connective tissue: Secondary | ICD-10-CM | POA: Diagnosis not present

## 2013-03-14 DIAGNOSIS — Z853 Personal history of malignant neoplasm of breast: Secondary | ICD-10-CM | POA: Insufficient documentation

## 2013-03-14 DIAGNOSIS — R109 Unspecified abdominal pain: Secondary | ICD-10-CM

## 2013-03-14 DIAGNOSIS — B999 Unspecified infectious disease: Secondary | ICD-10-CM | POA: Diagnosis not present

## 2013-03-14 DIAGNOSIS — Z79899 Other long term (current) drug therapy: Secondary | ICD-10-CM | POA: Insufficient documentation

## 2013-03-14 DIAGNOSIS — J9819 Other pulmonary collapse: Secondary | ICD-10-CM | POA: Diagnosis not present

## 2013-03-14 DIAGNOSIS — D649 Anemia, unspecified: Secondary | ICD-10-CM | POA: Diagnosis not present

## 2013-03-14 LAB — CBC WITH DIFFERENTIAL/PLATELET
Basophils Relative: 0 % (ref 0–1)
Eosinophils Absolute: 0 10*3/uL (ref 0.0–0.7)
HCT: 41.8 % (ref 36.0–46.0)
Hemoglobin: 13.5 g/dL (ref 12.0–15.0)
MCHC: 32.3 g/dL (ref 30.0–36.0)
Monocytes Relative: 7 % (ref 3–12)
Neutro Abs: 9.2 10*3/uL — ABNORMAL HIGH (ref 1.7–7.7)
Neutrophils Relative %: 82 % — ABNORMAL HIGH (ref 43–77)
Platelets: 165 10*3/uL (ref 150–400)
RBC: 4.5 MIL/uL (ref 3.87–5.11)
WBC: 11.2 10*3/uL — ABNORMAL HIGH (ref 4.0–10.5)

## 2013-03-14 LAB — PRO B NATRIURETIC PEPTIDE: Pro B Natriuretic peptide (BNP): 2793 pg/mL — ABNORMAL HIGH (ref 0–450)

## 2013-03-14 LAB — URINALYSIS, ROUTINE W REFLEX MICROSCOPIC
Hgb urine dipstick: NEGATIVE
Ketones, ur: NEGATIVE mg/dL
Protein, ur: NEGATIVE mg/dL
Urobilinogen, UA: 0.2 mg/dL (ref 0.0–1.0)

## 2013-03-14 LAB — COMPREHENSIVE METABOLIC PANEL
ALT: 36 U/L — ABNORMAL HIGH (ref 0–35)
Calcium: 9 mg/dL (ref 8.4–10.5)
Creatinine, Ser: 1.73 mg/dL — ABNORMAL HIGH (ref 0.50–1.10)
GFR calc Af Amer: 32 mL/min — ABNORMAL LOW (ref >90)
Glucose, Bld: 128 mg/dL — ABNORMAL HIGH (ref 70–99)
Sodium: 141 mEq/L (ref 135–145)
Total Protein: 6.9 g/dL (ref 6.0–8.3)

## 2013-03-14 LAB — LIPASE, BLOOD: Lipase: 41 U/L (ref 11–59)

## 2013-03-14 MED ORDER — ENSURE COMPLETE PO LIQD
237.0000 mL | Freq: Once | ORAL | Status: AC
  Administered 2013-03-14: 237 mL via ORAL
  Filled 2013-03-14: qty 237

## 2013-03-14 MED ORDER — SODIUM CHLORIDE 0.9 % IV SOLN
INTRAVENOUS | Status: DC
  Administered 2013-03-14: 12:00:00 via INTRAVENOUS

## 2013-03-14 MED ORDER — SODIUM CHLORIDE 0.9 % IV BOLUS (SEPSIS)
250.0000 mL | Freq: Once | INTRAVENOUS | Status: AC
  Administered 2013-03-14: 12:00:00 via INTRAVENOUS

## 2013-03-14 NOTE — ED Notes (Signed)
Pt from [facility].  Staff reports pt has had low grade fever x 2 days and ekg changes.  Denies any pain. Pt has dementia.

## 2013-03-14 NOTE — Progress Notes (Addendum)
Patient ID: [name], female   DOB: Jul 16, 1935, 77 y.o.   MRN: [id number]  This is an acute visit.  Level of care skilled.  Facility [facility].  Chief complaint-acute visit followup ER visit for fever.  History of present illness.  Patient is a pleasant 77 year old female who I saw 2 days ago for a low grade temperature and patient just not looking well-lab work was ordered which was fairly unremarkable with a minimally elevated white count of 10.8.  When I saw her later --she appeared to be doing better was more alert smiling which was her baseline-the urine was ordered as well.  Dr. Dellia Nims did followup yesterday her blood pressure was elevated and he did increase her Lopressor to 25 mg twice a day-the bleeding I see is 161/82 yesterday.  Apparently this morning patient did not appear to be doing well her blood pressure was elevated apparently with diastolic over [id number] was sent to the ER where they did an extensive workup which essentially did not show any acute process.  Her head CT did not show any acute process chest x-ray was negative as well-urinalysis also looked fairly benign as well as her EKG-.  Labs did show a mildly elevated white count of 11.2 which was a bit higher than it was couple days ago but fairly minimally so.  She did receive IV fluids for concerns secondary to mild dehydration with a creatinine of 1.73-she does have some history of chronic renal insufficiency--with creatinine often running in the higher ones.  She has returned to the facility.  Family medical social history has been reviewed per history and physical on 05/19/2010.  Medications have been reviewed.  Review of systems essentially unobtainable secondary to dementia.  Physical exam  She is afebrile pulse is 74 respirations of 17 blood pressure taken manually initially 156/104-by machine 150/99-I did recheck it manually later and it was 150/102.  In general this is a frail elderly female  initially diaphoretic however when her covers were removed she quickly appeared to be more at her baseline certainly less diaphoretic not sweating I suspect it was the covering. She appears to be comfortable although weaker appearing than her normal presentation still smiling  Oropharynx clear mucous membranes moist  Chest is clear to auscultation with poor respiratory effort no labored breathing.  Heart is regular rate and rhythm without murmur gallop or rub.  Abdomen is soft does not appear to be acutely tender to palpation somewhat tender to the invasive maneuver but this does not appear to be acute-her bowel sounds are positive.  GU--possibly some suprapubic tenderness to palpation although again this may be more reaction to the invasive maneuver.  Neurologic is grossly intact I do not see any focal deficits she is smiling cranial nerves appear to be intact grip strength appears to be intact-somewhat limited exam since she does not follow verbal commands but I do not see really anything different from baseline.  Psych- does have significant dementia appears to be at baseline although a bit weaker appearing.  .  .  Labs-.  As stated in history of present illness.  Assessment and plan.  #1-history of fever or with very mild leukocytosis-she currently is afebrile vital signs are stable-workup in the ER was fairly extensive but did not show really anything significantly different from baseline-at this point will monitor carefully with vital signs every 2 hours with pulse ox x2-then every 4 hours x2-then every shift.  Also will update lab work tomorrow including a  CBC with differential to monitor leukocytosis -- also will get a metabolic panel to follow her renal function--we'll have nursing staff check with her daughter midmorning tomorrow to make sure she is still in agreement with ordering the labs-- I did discuss this with her daughter at bedside  #2-hypertension-this had been an  issue yesterday and her blood pressure apparently was significantly elevated this morning before going to the ER where it came down some apparently to systolics in the [id number] Loprressor has just been increased she has not received it yet this evening  as she has just come back from the ER-- I did speak with nursing about her receiving this-monitor her blood pressure closely if diastolic continues to be elevated suspect she will need clonidine  .

## 2013-03-14 NOTE — ED Notes (Signed)
Daughter requesting can of ensure.  Notified EDP ok per Dr. Rogene Houston.  ENsure ordered from pharmacy.

## 2013-03-14 NOTE — ED Notes (Signed)
Pt's daughter reports pt has not hardly eaten very much over the past 3 or 4 days.

## 2013-03-14 NOTE — ED Notes (Signed)
Pt had in/out cath performed pt tolerated well. Assisted by 3 staff. And daughter. Specimen sent to lab.

## 2013-03-14 NOTE — ED Provider Notes (Signed)
LEVEL 5 CAVEAT: DEMENTIA CSN: [id number]     Arrival date & time 03/14/13  [id number] History  This chart was scribed for Mervin Kung, MD,  by Stacy Gardner, ED Scribe. The patient was seen in room APA19/APA19 and the patient's care was started at 11:16 AM    First MD Initiated Contact with Patient 03/14/13 1026     Chief Complaint  Patient presents with  . Fever  . ekg changes    (Consider location/radiation/quality/duration/timing/severity/associated sxs/prior Treatment) LEVEL 5 CAVEAT:DEMENTIA Patient is a 77 y.o. female presenting with fever. The history is provided by medical records, the nursing home and a relative. No language interpreter was used.  Fever  HPI Comments: [name] is a 77 y.o. female who presents from [facility] to the Emergency Department complaining of intermittent low grade fever for the past two days and changes to her EKG. Per daughter she was lethargic and was not eating well 3 days ago. Pt denies being in any pain. She has a past medical hx of Gastroparalysis, HTN, breast cancer, and osteoporosis. Pt is currently on the medications Peridex, Ativan, Tylenol, Lopressor, Prilosec, Phenergan, Senokot and Nolvadex. She has allergies to Aricept and Lithium.  Past Medical History  Diagnosis Date  . Dementia   . Gastroparalysis   . HTN (hypertension)   . Gallstones   . Reflux   . Cancer   . Osteoporosis 11/18/2011  . Infiltrating ductal carcinoma of right female breast 12/02/2009    Qualifier: History of  By: [name] CMA [name]), June     Past Surgical History  Procedure Laterality Date  . Tonsillectomy and adenoidectomy    . Breast biopsy    . Breast lumpectomy     Family History  Problem Relation Age of Onset  . Cancer    . Cancer Sister     breast   History  Substance Use Topics  . Smoking status: Never Smoker   . Smokeless tobacco: Never Used  . Alcohol Use: No   OB History   Grav Para Term Preterm Abortions TAB SAB Ect Mult  Living                 Review of Systems  Unable to perform ROS: Dementia    Allergies  Aricept and Lithium  Home Medications   Current Outpatient Rx  Name  Route  Sig  Dispense  Refill  . acetaminophen (TYLENOL) 325 MG tablet   Oral   Take 650 mg by mouth every 6 (six) hours as needed.           [name] aspirin 81 MG chewable tablet   Oral   Chew 81 mg by mouth daily.         . calcium-vitamin D (OSCAL WITH D) 500-200 MG-UNIT per tablet   Oral   Take 1 tablet by mouth 2 (two) times daily.         . chlorhexidine (PERIDEX) 0.12 % solution   Mouth/Throat   Use as directed 15 mLs in the mouth or throat 2 (two) times daily.         . divalproex (DEPAKOTE SPRINKLE) 125 MG capsule   Oral   Take 500 mg by mouth at bedtime.         . metoprolol tartrate (LOPRESSOR) 25 MG tablet   Oral   Take 25 mg by mouth 2 (two) times daily.          . Multiple Vitamin (MULTIVITAMIN WITH MINERALS) TABS tablet  Oral   Take 1 tablet by mouth daily.         [name] omeprazole (PRILOSEC) 20 MG capsule   Oral   Take 20 mg by mouth daily.           . ondansetron (ZOFRAN-ODT) 4 MG disintegrating tablet   Oral   Take 4 mg by mouth 5 (five) times daily. 4 times daily and at bedtime.         . polyethylene glycol (MIRALAX / GLYCOLAX) packet   Oral   Take 17 g by mouth daily.           . promethazine (PHENERGAN) 12.5 MG suppository   Rectal   Place 12.5 mg rectally every 6 (six) hours as needed for nausea or vomiting.          . promethazine (PHENERGAN) 25 MG tablet   Oral   Take 25 mg by mouth every 6 (six) hours as needed for nausea.         . sennosides-docusate sodium (SENOKOT-S) 8.6-50 MG tablet   Oral   Take 2 tablets by mouth daily.          . tamoxifen (NOLVADEX) 20 MG tablet   Oral   Take 20 mg by mouth daily.            There were no vitals taken for this visit. Physical Exam  Nursing note and vitals reviewed. Constitutional: She appears  well-developed and well-nourished. No distress.  HENT:  Mouth/Throat: Oropharynx is clear and moist.  Eyes: EOM are normal. Pupils are equal, round, and reactive to light.  Neck: Normal range of motion.  Cardiovascular: Normal rate, regular rhythm, normal heart sounds and intact distal pulses.  Exam reveals no gallop and no friction rub.   No murmur heard. Pulmonary/Chest: Effort normal and breath sounds normal. She has no wheezes. She has no rales.  Abdominal: Bowel sounds are normal. She exhibits no distension. There is no tenderness. There is no rebound and no guarding.  Musculoskeletal: Normal range of motion. She exhibits no edema and no tenderness.  Lymphadenopathy:    She has no cervical adenopathy.  Neurological: She is alert.  Skin: She is not diaphoretic.  Psychiatric: She has a normal mood and affect. Her behavior is normal.    ED Course  Procedures (including critical care time) DIAGNOSTIC STUDIES: Oxygen Saturation isby my interpretation.     COORDINATION OF CARE: 11:20 AM Discussed course of care with pt's caregivers . Pt's caregivers understands and agrees.  Labs Review Labs Reviewed  URINALYSIS, ROUTINE W REFLEX MICROSCOPIC - Abnormal; Notable for the following:    Specific Gravity, Urine <1.005 (*)    All other components within normal limits  COMPREHENSIVE METABOLIC PANEL - Abnormal; Notable for the following:    Glucose, Bld 128 (*)    BUN 33 (*)    Creatinine, Ser 1.73 (*)    Albumin 3.2 (*)    ALT 36 (*)    GFR calc non Af Amer 28 (*)    GFR calc Af Amer 32 (*)    All other components within normal limits  CBC WITH DIFFERENTIAL - Abnormal; Notable for the following:    WBC 11.2 (*)    Neutrophils Relative % 82 (*)    Neutro Abs 9.2 (*)    All other components within normal limits  PRO B NATRIURETIC PEPTIDE - Abnormal; Notable for the following:    Pro B Natriuretic peptide (BNP) 2793.0 (*)    All  other components within normal limits  TROPONIN I   LIPASE, BLOOD   Results for orders placed during the hospital encounter of 03/14/13  TROPONIN I      Result Value Range   Troponin I <0.30  <0.30 ng/mL  URINALYSIS, ROUTINE W REFLEX MICROSCOPIC      Result Value Range   Color, Urine YELLOW  YELLOW   APPearance CLEAR  CLEAR   Specific Gravity, Urine <1.005 (*) 1.005 - 1.030   pH 6.0  5.0 - 8.0   Glucose, UA NEGATIVE  NEGATIVE mg/dL   Hgb urine dipstick NEGATIVE  NEGATIVE   Bilirubin Urine NEGATIVE  NEGATIVE   Ketones, ur NEGATIVE  NEGATIVE mg/dL   Protein, ur NEGATIVE  NEGATIVE mg/dL   Urobilinogen, UA 0.2  0.0 - 1.0 mg/dL   Nitrite NEGATIVE  NEGATIVE   Leukocytes, UA NEGATIVE  NEGATIVE  COMPREHENSIVE METABOLIC PANEL      Result Value Range   Sodium 141  135 - 145 mEq/L   Potassium 3.9  3.5 - 5.1 mEq/L   Chloride 104  96 - 112 mEq/L   CO2 28  19 - 32 mEq/L   Glucose, Bld 128 (*) 70 - 99 mg/dL   BUN 33 (*) 6 - 23 mg/dL   Creatinine, Ser 1.73 (*) 0.50 - 1.10 mg/dL   Calcium 9.0  8.4 - 10.5 mg/dL   Total Protein 6.9  6.0 - 8.3 g/dL   Albumin 3.2 (*) 3.5 - 5.2 g/dL   AST 28  0 - 37 U/L   ALT 36 (*) 0 - 35 U/L   Alkaline Phosphatase 62  39 - 117 U/L   Total Bilirubin 0.3  0.3 - 1.2 mg/dL   GFR calc non Af Amer 28 (*) >90 mL/min   GFR calc Af Amer 32 (*) >90 mL/min  CBC WITH DIFFERENTIAL      Result Value Range   WBC 11.2 (*) 4.0 - 10.5 K/uL   RBC 4.50  3.87 - 5.11 MIL/uL   Hemoglobin 13.5  12.0 - 15.0 g/dL   HCT 41.8  36.0 - 46.0 %   MCV 92.9  78.0 - 100.0 fL   MCH 30.0  26.0 - 34.0 pg   MCHC 32.3  30.0 - 36.0 g/dL   RDW 13.2  11.5 - 15.5 %   Platelets 165  150 - 400 K/uL   Neutrophils Relative % 82 (*) 43 - 77 %   Neutro Abs 9.2 (*) 1.7 - 7.7 K/uL   Lymphocytes Relative 12  12 - 46 %   Lymphs Abs 1.3  0.7 - 4.0 K/uL   Monocytes Relative 7  3 - 12 %   Monocytes Absolute 0.7  0.1 - 1.0 K/uL   Eosinophils Relative 0  0 - 5 %   Eosinophils Absolute 0.0  0.0 - 0.7 K/uL   Basophils Relative 0  0 - 1 %   Basophils  Absolute 0.0  0.0 - 0.1 K/uL  LIPASE, BLOOD      Result Value Range   Lipase 41  11 - 59 U/L  PRO B NATRIURETIC PEPTIDE      Result Value Range   Pro B Natriuretic peptide (BNP) 2793.0 (*) 0 - 450 pg/mL    Imaging Review Dg Chest 1 View  03/14/2013   CLINICAL DATA:  Altered mental status  EXAM: CHEST - 1 VIEW  COMPARISON:  06/01/2012  FINDINGS: Cardiomediastinal silhouette is stable. Mild elevation of the left hemidiaphragm. Left basilar atelectasis. No  segmental infiltrate or pulmonary edema.  IMPRESSION: No segmental infiltrate or pulmonary edema. Elevation of the left hemidiaphragm with left basilar atelectasis.   Electronically Signed   By: Lahoma Crocker M.D.   On: 03/14/2013 12:13   Ct Head Wo Contrast  03/14/2013   CLINICAL DATA:  History of dementia. Two day history of low grade fever. History of infiltrating right ductal breast cancer.  EXAM: CT HEAD WITHOUT CONTRAST  TECHNIQUE: Contiguous axial images were obtained from the base of the skull through the vertex without intravenous contrast.  COMPARISON:  04/29/2010 study.  FINDINGS: There is no evidence of brain mass, brain hemorrhage, or acute infarction.  There is no evidence of shift of midline structures, parenchymal lesion, or subdural or epidural hematoma.  Diffuse cerebral atrophic changes appear stable. Ventricular enlargement is felt to be related to the atrophic changes. No evidence of obstructive hydrocephalus is seen. There is stable appearance of the ventricles. Areas of periventricular and white matter hypodensity consistent with chronic deep white matter ischemia microvascular changes appear stable.  The calvarium is intact. Small lucent area in the right posterior parietal region is felt to represent probable venous lake. It is unchanged from prior study. Mastoids are well aerated. No sinusitis is evident.  IMPRESSION: There is no evidence of brain mass, brain hemorrhage, or acute infarction.  Diffuse cerebral atrophic changes  appear stable. Areas of periventricular and white matter hypodensity consistent with chronic deep white matter ischemia microvascular changes appear stable.  No acute or active brain process is seen. No skull lesion is evident. No sinusitis is evident.   Electronically Signed   By: Shanon Brow  Call M.D.   On: 03/14/2013 12:20    EKG Interpretation     Ventricular Rate:  83 PR Interval:  114 QRS Duration: 88 QT Interval:  386 QTC Calculation: 453 R Axis:   -66 Text Interpretation:  Normal sinus rhythm with sinus arrhythmia Left anterior fascicular block Nonspecific ST abnormality Abnormal ECG When compared with ECG of 14-Mar-2013 09:58, Premature atrial complexes are no longer Present            MDM   1. Fever, unspecified   2. Confusion    Patient with extensive workup without specific findings. Does have a mild leukocytosis. EKG without any acute findings labs without any significant abnormalities. She may have had some mild dehydration seemed per cup of fluids here. No evidence urinary tract infection chest x-rays negative for pneumonia or pulmonary edema pneumothorax. CT is no acute changes. Patient return to [facility].  I personally performed the services described in this documentation, which was scribed in my presence. The recorded information has been reviewed and is accurate.     Mervin Kung, MD 03/14/13 [phone]

## 2013-03-16 DIAGNOSIS — I10 Essential (primary) hypertension: Secondary | ICD-10-CM | POA: Diagnosis not present

## 2013-03-18 ENCOUNTER — Non-Acute Institutional Stay (SKILLED_NURSING_FACILITY): Payer: Medicare Other | Admitting: Internal Medicine

## 2013-03-18 DIAGNOSIS — I10 Essential (primary) hypertension: Secondary | ICD-10-CM | POA: Diagnosis not present

## 2013-03-18 DIAGNOSIS — A419 Sepsis, unspecified organism: Secondary | ICD-10-CM | POA: Diagnosis not present

## 2013-03-18 DIAGNOSIS — E870 Hyperosmolality and hypernatremia: Secondary | ICD-10-CM

## 2013-03-18 DIAGNOSIS — N179 Acute kidney failure, unspecified: Secondary | ICD-10-CM | POA: Diagnosis not present

## 2013-03-18 DIAGNOSIS — D649 Anemia, unspecified: Secondary | ICD-10-CM | POA: Diagnosis not present

## 2013-03-18 LAB — GLUCOSE, CAPILLARY: Glucose-Capillary: 127 mg/dL — ABNORMAL HIGH (ref 70–99)

## 2013-03-18 NOTE — Progress Notes (Signed)
Patient ID: [name], female   DOB: 1935-06-03, 77 y.o.   MRN: [id number] Facility[facility] Chief complaint, hypotension, hypernatremia History; this is a patient with severe dementia. She has not been doing well over the last week with increasing lethargy, declining by mouth intake. I believe she was seen by our service 3 times last week a. Lab work was ordered. The urinalysis looked completely clean, blood culture was negative for. White count 9.7 hemoglobin 13.1 BUN of 36 a creatinine of 1.71 all of this on November 6 she was found to be tachycardic last week her Lopressor was increased to. An EKG report suggested The possibility of an inferior MI in the setting of a left anterior fascicular block. She was sent to the [facility] on November 6 at which time her sodium was 141 potassium 3.9 BUN 33 creatinine 1.73 lipase 41 liver function tests were normal troponin I was less than 0.3, pro BNP was 2793 white count at 11.2 with 82% neutrophils hemoglobin 13.5.  Over this past weekend things continued to decline. Her daughter states she was listless and not eating. Her blood pressure was very elevated sometimes with diastolics in the A999333 range she was given clonidine. This morning her blood pressure is 80 systolic. Lab work shows a white count of 18.8 hemoglobin of 16.2 sodium is 153 BUN of 68 creatinine of 3.95. The glucose on her lab work has recently been elevated today is at 151 even though she is not a diabetic  Review of systems; not possible from the patient however the daughter states increasing degree of lethargy, virtually no oral intake over the last week. From nursing there is no diarrhea no vomiting.  Physical examination Gen. this patient does not look well lethargic mumbling incoherently. Blood pressure is 80 systolic, pulse A999333 respirations 26 temperature 98.6 O2 sat is 93% on room air Respiratory; shallow but otherwise clear entry there is no wheezing  Cardiac  heart sounds are slightly tacky there may be a gallop rhythm, no murmurs. 5-10% dehydration Abdomen; exam was done carefully. Bowel sounds are positive. There is no liver no spleen no masses. It would appear that she is more tender in the left midabdomen and left lower quadrant than on the right although I maybe overreading this Skin no pressure ulcers are seen certainly no evidence of a cellulitis or fasciitis contributing.  Impression/plan #1 shock, probably septic in combination with hypovolemic shock. #2 sepsis; source of this is unclear. We have had to urinalysis that are both clean and a pending urine culture. The patient has been on Levaquin since November 6. I wonder about a budding intra-abdominal source of this either appendicitis, diverticulitis. #3 acute renal failure. This is probably mostly prerenal although a post renal issue cannot be excluded at this time #4 hypernatremia with a sodium of 153. This by itself is enough reason for delirium in an elderly patient with severe dementia. I believe there is much more going on in this patient unfortunately  I'm going to give this patient aggressive fluid resuscitation. Broad-spectrum antibiotics probably Zosyn. I will do plain x-rays of the chest and abdomen. She will need a Foley catheter to monitor urine output. I've spoken at length today with her daughter. She does not want her sent to the hospital but is not comfortable with pure comfort care. She would like to see if she rallies with broad-spectrum antibiotics and fluids. I have told her that patient may have an underlying abdominal issue here  which would require hospitalization for investigations. She is not interested in this. Patient is already a DO NOT RESUSCITATE

## 2013-03-19 ENCOUNTER — Ambulatory Visit (HOSPITAL_COMMUNITY): Payer: Medicare Other | Attending: Internal Medicine

## 2013-03-19 ENCOUNTER — Ambulatory Visit (HOSPITAL_COMMUNITY): Payer: Medicare Other

## 2013-03-19 ENCOUNTER — Non-Acute Institutional Stay (SKILLED_NURSING_FACILITY): Payer: Medicare Other | Admitting: Internal Medicine

## 2013-03-19 DIAGNOSIS — I129 Hypertensive chronic kidney disease with stage 1 through stage 4 chronic kidney disease, or unspecified chronic kidney disease: Secondary | ICD-10-CM | POA: Insufficient documentation

## 2013-03-19 DIAGNOSIS — N179 Acute kidney failure, unspecified: Secondary | ICD-10-CM

## 2013-03-19 DIAGNOSIS — A419 Sepsis, unspecified organism: Secondary | ICD-10-CM | POA: Insufficient documentation

## 2013-03-19 DIAGNOSIS — R52 Pain, unspecified: Secondary | ICD-10-CM | POA: Insufficient documentation

## 2013-03-19 DIAGNOSIS — D649 Anemia, unspecified: Secondary | ICD-10-CM | POA: Diagnosis not present

## 2013-03-19 DIAGNOSIS — R109 Unspecified abdominal pain: Secondary | ICD-10-CM | POA: Diagnosis not present

## 2013-03-19 DIAGNOSIS — R509 Fever, unspecified: Secondary | ICD-10-CM | POA: Insufficient documentation

## 2013-03-19 DIAGNOSIS — I10 Essential (primary) hypertension: Secondary | ICD-10-CM | POA: Diagnosis not present

## 2013-03-19 DIAGNOSIS — R079 Chest pain, unspecified: Secondary | ICD-10-CM | POA: Diagnosis not present

## 2013-03-19 DIAGNOSIS — E870 Hyperosmolality and hypernatremia: Secondary | ICD-10-CM | POA: Diagnosis not present

## 2013-03-19 DIAGNOSIS — I959 Hypotension, unspecified: Secondary | ICD-10-CM

## 2013-03-19 NOTE — Progress Notes (Signed)
Patient ID: [name], female   DOB: [birth year], 77 y.o.   MRN: [id number]  This is an acute visit.  Level of care skilled.  Facility [facility].  Chief complaint-acute visit followup leukocytosis acute renal failure lethargy.  History of present illness Patient is a 77 year old female with a history of severe dementia.  Last 10 days or so she has not been doing very well with lethargy poor by mouth intake we have seen her quite frequently.  Initial lab work did not look all that concerning urinalysis looked clean blood culture was negative previous white count was 9.7 hemoglobin 13.1.  She did have episodes of tachycardia her Lopressor was increased.  She had an EKG that showed possibility of an inferior MI in the setting of a left anterior fascicular block.  She did go [facility] and found  to be mildly dehydrated and received IV fluids.  She continued to decline clinically over the weekend blood pressures would be quite elevated with diastolics in the A999333 range this required when necessary clonidine   She also started to develop a mildly elevated white count and she was placed on Levaquin-she had done well with this antibiotic in the past at times --  However yesterday morning her blood pressure was only 80 systolic her white count also was significantly  changed showing a white count of 18.8 hemoglobin of 16.2 sodium of 153 a creatinine of 3.95 a BUN of 68  Her Lopressor was held continues to be held also her Depakote was DC'd.  Dr. Dellia Nims did have an extensive discussion with her daughter who is also the dietitian in the facility she did not desire hospitalization at this point and thus opted for fairly aggressive treatment here in the facility and Dr. Dellia Nims did start her on a aggressive IV fluid resuscitation as well as Zosyn broad-spectrum antibiotic  Today according to nursing staff she is somewhat more alert although appears to be very weak.  Lab work was done  earlier today which was suspicious showing a normal white count of 9.0 and hemoglobin was down to 7.9 and platelets of 70,000.  We did reorder lab and has come back showing a WBC of 13.2 hemoglobin of 11.6 and platelets of 98,000 -granulocyte are elevated with absolute grans of 10.8  Family medical social history has been reviewed her previous progress note 03/18/2013 as well as. 03/09/2013 and initial H&P on 05/19/2010.  Medications have been reviewed per MAR.  Review of systems essentially unobtainable-according to nursing staff she is more alert although continues with quite poor by mouth intake.  Physical exam.  Temperature is 99.3 pulse 76 respirations 16 blood pressure 77/50  In general this is a frail ill-appearing female lying in bed she does not appear uncomfortable.  Her skin is warm and dry does have some bruising of her extremities bilaterally.  Eyes-pupils appeared equal round reactive to light.  Chest is clear to auscultation with no labored breathing but she has poor respiratory effort.  Heart is regular rate and rhythm with occasional irregular beats she does not have significant lower extremity edema.  Abdomen is soft does appear to be somewhat tender to palpation although really hard to localize this-somewhat diffuse pain I would say---she has positive bowel sounds.  Neurologic is grossly intact although limited exam since patient appears to be very weak.  Labs.  03/19/2013.  WBCs 13.2 hemoglobin 11.6 platelets 98,000 absolute granulocytes elevated at 10.8.  03/18/2013.  WBC 18.8 hemoglobin 16.2  platelets 189,000.  Sodium 153 potassium 4.5 BUN 68 creatinine 3.95.--Calcium 10.1  Assessment and plan.  #1-possibility of septic shock-clinically she appears possibly to be minimally improved-still very weak with unclear source of possible sepsis.--She has been started on Zosyn-her daughter apparently has deferred any chest x-ray or abdominal x-rays at this  time--white count appears to be trending down however do note she does have some thrombocytopenia  Per Dr. Janalyn Rouse assessment yesterday there is a question about possibly an intra-abdominal source although her daughter does not wish hospitalization for workup at this time.  #2-s history of acute renal failure-she is on aggressive IV fluids and updated metabolic panel is pending.  #3-hypernatremia-again she is on D5 half-normal saline hopefully this will have a beneficial effect although certainly a challenging situation considering the entire clinical picture here  Addendum.  I did  discuss patient's situation with her daughter-later today-at this point she would be okay with obtaining the chest and abdominal x-rays-I did discuss this may give [place] some insight into what is happening with her elevated white count and abdominal pain-we will obtain this and await results  I also did reevaluate her she actually appears to be a bit more peppy although still very weak she is smiling talking a bit which she was not much earlier  I did recheck her blood pressure and got 96/60-pulse continues to be regular rate and rhythm  .  Otherwise physical exam appears to be unchanged possibly some improvement in her abdominal discomfort when her abdomen is palpated.  We also obtained update labs.  Of note labs were drawn twice today because of apparently lab complications and somewhat concerning lab values with the hemoglobin was 7.9 however the updated one again is  11.6.  We have obtain the l CMP drawn  which shows a sodium of 141 potassium 3.5 BUN 65 creatinine 3.34.  ALT is minimally elevated at 37 albumin is 2.1 calcium 6.8--on November 4 liver function tests were within normal limits her calcium was 9.6-I note the calcium on metabolic panel yesterday was 10.1  I did reassess the patient later in the evening and she actually appeared to be improving somewhat although certainly not at her baseline--she  is talking laughing confused but appears to have more of her personality.  Her physical exam remains baseline with previous exam although she is more active.  We have obtained her updated chest x-ray and abdominal x-ray which actually did not show anything acute-.  At this point we'll continue to monitor she will need close followup tomorrow when Dr. Dellia Nims is in the facility-continues to be a very challenging situation with her fragile clinical status.  I did discuss her clinical situation with her daughter several times during the course of the day.  [id number] note greater than 45 minutes spent assessing patient and reassess patient-coordinating and formulating a plan of care using numerous labs and studies during the day-as well as discussing her clinical situation extensively with her daughter who is in the facility as well  . .  .    .  [name]

## 2013-03-20 ENCOUNTER — Ambulatory Visit (HOSPITAL_COMMUNITY): Payer: Medicare Other

## 2013-03-20 ENCOUNTER — Inpatient Hospital Stay
Admission: RE | Admit: 2013-03-20 | Discharge: 2014-01-29 | Disposition: A | Discharge status: Nursing Home (Includes ICF) | Payer: Medicaid Other | Source: Ambulatory Visit | Attending: Internal Medicine | Admitting: Internal Medicine

## 2013-03-20 ENCOUNTER — Ambulatory Visit (HOSPITAL_COMMUNITY): Payer: Medicare Other | Attending: Internal Medicine

## 2013-03-20 ENCOUNTER — Encounter: Payer: Self-pay | Admitting: Internal Medicine

## 2013-03-20 ENCOUNTER — Non-Acute Institutional Stay (SKILLED_NURSING_FACILITY): Payer: Medicare Other | Admitting: Internal Medicine

## 2013-03-20 DIAGNOSIS — J189 Pneumonia, unspecified organism: Secondary | ICD-10-CM | POA: Diagnosis not present

## 2013-03-20 DIAGNOSIS — A419 Sepsis, unspecified organism: Secondary | ICD-10-CM | POA: Diagnosis not present

## 2013-03-20 DIAGNOSIS — W19XXXA Unspecified fall, initial encounter: Secondary | ICD-10-CM

## 2013-03-20 DIAGNOSIS — R05 Cough: Secondary | ICD-10-CM

## 2013-03-20 DIAGNOSIS — N179 Acute kidney failure, unspecified: Secondary | ICD-10-CM

## 2013-03-20 DIAGNOSIS — M79602 Pain in left arm: Secondary | ICD-10-CM

## 2013-03-20 DIAGNOSIS — Z452 Encounter for adjustment and management of vascular access device: Secondary | ICD-10-CM | POA: Diagnosis not present

## 2013-03-20 DIAGNOSIS — R52 Pain, unspecified: Secondary | ICD-10-CM

## 2013-03-20 DIAGNOSIS — R82998 Other abnormal findings in urine: Secondary | ICD-10-CM

## 2013-03-20 DIAGNOSIS — R109 Unspecified abdominal pain: Principal | ICD-10-CM

## 2013-03-20 DIAGNOSIS — R0989 Other specified symptoms and signs involving the circulatory and respiratory systems: Secondary | ICD-10-CM | POA: Diagnosis not present

## 2013-03-20 MED ORDER — SODIUM CHLORIDE 0.9 % IJ SOLN: 10.0000 mL | INTRAMUSCULAR | Status: DC | PRN

## 2013-03-20 MED ORDER — SODIUM CHLORIDE 0.9 % IJ SOLN: 10.0000 mL | Freq: Two times a day (BID) | INTRAMUSCULAR | Status: DC

## 2013-03-20 NOTE — Progress Notes (Signed)
Patient with placement of picc successful. Patient back to [facility] via stretcher. Copy of xray and discharge instruction given and card stating length of catheter 45cm.

## 2013-03-20 NOTE — Progress Notes (Signed)
Patient here for picc line placement for antibiotics for pneumonia.

## 2013-03-20 NOTE — Progress Notes (Signed)
This encounter was created in error - please disregard.

## 2013-03-20 NOTE — Progress Notes (Signed)
Patient ID: [name], female   DOB: Feb 27, 1936, 77 y.o.   MRN: [id number] [facility] Chief complaint, hypotension, hypernatremia,ARF History; this is a patient with severe dementia. She has not been doing well over the last week with increasing lethargy, declining by mouth intake. I believe she was seen by our service 3 times last week a. Lab work was ordered. The urinalysis looked completely clean, blood culture was negative for. White count 9.7 hemoglobin 13.1 BUN of 36 a creatinine of 1.71 all of this on November 6 she was found to be tachycardic last week her Lopressor was increased to. An EKG report suggested The possibility of an inferior MI in the setting of a left anterior fascicular block. She was sent to the [facility] on November 6 at which time her sodium was 141 potassium 3.9 BUN 33 creatinine 1.73 lipase 41 liver function tests were normal troponin I was less than 0.3, pro BNP was 2793 white count at 11.2 with 82% neutrophils hemoglobin 13.5.  Over this past weekend things continued to decline. Her daughter states she was listless and not eating. Her blood pressure was very elevated sometimes with diastolics in the A999333 range she was given clonidine. This morning her blood pressure is 80 systolic. Lab work shows a white count of 18.8 hemoglobin of 16.2 sodium is 153 BUN of 68 creatinine of 3.95. The glucose on her lab work has recently been elevated today is at 153 even though she is not a diabetic. On November 10 I gave her 75l of normal saline and have given HER-2 liters of half normal saline since then. Her IV infiltrated in the middle of the night. Lab work from yesterday I think in the morning was drawn out of her IV arm later in the day her white count was 13.2 which is an improvement for 18.8. Her hemoglobin is a was 11.6. There was apparently a slight improvement in the BUN and creatinine although I do not have these results.   Review of systems; patient is  more alert according to her daughter she is drinking some but not eating  Physical examination Gen. The patient is much more alert her urine output was 800 cc. Blood pressure is 95/60, pulse 86 Respiratory; crackles in the right lower lobe the left lung is clear Cardiac heart sounds are slightly tacky there may be a gallop rhythm, no murmurs.  She appears much more rehydrated today Abdomen; exam was done carefully. Bowel sounds are positive. There is no liver no spleen no masses. Has continued tenderness in the left lower/left mid quadrant but no guarding or rebound Skin no pressure ulcers are seen certainly no evidence of a cellulitis or fasciitis contributing. Musculoskeletal; he intensely tender area over the medial aspect of the left ankle which I suspect may be gout or pseudogout  Impression/plan #1 shock, probably septic in combination with hypovolemic shock. This has improved somewhat although her BUN and creatinine were still quite elevated yesterday. She will need continuance of her IV fluids through a PICC line and continuance of the Zosyn #2 sepsis; source of this is unclear. We have had to urinalysis that are both clean and a pending urine culture. The patient has been on Zosyn since November 10. Her white count is improved. Suspect her thrombocytopenia is also due to sepsis possibly gram-negative #3 acute renal failure. This is probably mostly prerenal although a post renal issue cannot be excluded at this time. If she stabilizes we'll send her  for a renal ultrasound #4 hypernatremia with a sodium of 153. This apparently has corrected. Again I do not see these results #5 question gout or pseudogout. I am going to give her prednisone 20 mg orally for 5 days  This patient has made some improvements in terms of her level of consciousness and her blood pressure and urine output. Whenever she is far from medically stable with continued acute on chronic renal failure, hypotension. I  discussed this in considerable detail with her daughter. Ultimately after in-depth discussion she has agreed to keep her here and she really does not want her hospitalized and does not want a feeding tube. She will need a PICC line, continued IV fluids and continued IV antibiotics with Zosyn. I will repeat her lab work tomorrow and on Friday. Her daughter is aware that this may ultimately be a terminal event. We have made her no hospitalizations but will continue the care as outlined above

## 2013-03-21 ENCOUNTER — Non-Acute Institutional Stay (SKILLED_NURSING_FACILITY): Payer: Medicare Other | Admitting: Internal Medicine

## 2013-03-21 DIAGNOSIS — E870 Hyperosmolality and hypernatremia: Secondary | ICD-10-CM | POA: Diagnosis not present

## 2013-03-21 DIAGNOSIS — N179 Acute kidney failure, unspecified: Secondary | ICD-10-CM

## 2013-03-21 DIAGNOSIS — D649 Anemia, unspecified: Secondary | ICD-10-CM | POA: Diagnosis not present

## 2013-03-21 DIAGNOSIS — M109 Gout, unspecified: Secondary | ICD-10-CM

## 2013-03-21 DIAGNOSIS — A419 Sepsis, unspecified organism: Secondary | ICD-10-CM

## 2013-03-21 DIAGNOSIS — I10 Essential (primary) hypertension: Secondary | ICD-10-CM | POA: Diagnosis not present

## 2013-03-21 NOTE — Progress Notes (Signed)
Patient ID: [name], female   DOB: 1935-10-25, 77 y.o.   MRN: [id number]  This is an acute visit.  Level care skilled.  Facility [facility].  Chief complaint-acute visit followup failure to thrive altered mental status leukocytosis acute renal failure.  History of present illness.  Patient is a 77 year old female with severe dementia who has had a rough course here the past week or so.  Initially she presented with increased lethargy tachycardia and elevated blood pressure-her white count did trend up actually to actually over 18,000 earlier this week-lab work including a urinalysis urine culture and blood cultures were essentially negative.  She was placed initially on Levaquin when her white count started to go up-this was DC'd and Zosyn was started when her white count rose to 18.8.  Over the past several days her white count has trended down and her mental status has improved somewhat although she continues to be quite weak.  Lab work was remarkable initially for a creatinine of 3.95 on November 10 IV fluids have been started and this has now trended down as of today to 2.54.  Her white count also was down to 10.8.  I do note her sodium has risen somewhat from the previous level is now 148 actually had been in the1 low 150s on November 10 as well  Dr. Dellia Nims I have had extensive discussions with her daughter over the past week-she has desired to keep her mother out of the hospital but aggressive treatment here in the facility although of  her prognosis is quite guarded  She did initially have high blood pressure at this appears to have trended down or she actually has hypotensive readings at times systolics recently been in the 90s most recent one today was 101/70 she is no longer tachycardic and she is not afebrile.  Of note when Dr. Dellia Nims saw her yesterday apparently there was some pain of her right ankle area with some erythema and tenderness of the lateral aspect-this was thought  possibly to be gout or pseudogout and she was started on prednisone this appears to have a beneficial effect according to the staff and family less tenderness and erythema today  Family medical social history has been reviewed her previous progress notes this week as well as history and physical on 05/19/2010.  Medications have been reviewed.  Review of systems from family and nursing staff patient is a poor historian but she appears to be more alert.  She is afebrile she does not complaining of pain shortness of breath.  Her appetite continues to be very poor although she will take fluids-- did vomit a bit before I came into the room there appeared to be undigested supplement that her daughter has been trying to get her to eat.  Physical exam.  Temperature is 98.2 pulse 75 respirations 18 blood pressure 101/70.  In general this is a frail elderly female who appears to be more herself confused but pleasant smiling talking very sweet disposition.  Her skin is warm and dry she does appear somewhat flushed.  Oropharynx is clear mucous membranes moist.  Chest is clear to auscultation no labored breathing.  Heart is regular rate and rhythm without murmur gallop or rub.  Abdomen --somewhat distended but soft does not appear to be as tender as it did earlier this week she does have active bowel sounds  Muscle skeletal appears to move her extremities at baseline although limits and she is in bed-she does have a small erythematous area  lateral right ankle-apparently this is less tender than it was yesterday and less erythematous  Neurologic-appears to be grossly intact her speech is clear I did not see any lateralizing findings.  Psych she appears to be more herself today although weak she has her personality back pleasantly confused   Labs.  03/21/2013.  CBC 10.8 hemoglobin 10.7 platelets 117.  Sodium 148 potassium 4.1 BUN 46 creatinine 2.54.  Assessment and  plan.  #1-lethargy-acute renal failure-thought secondary to a septic process with leukocytosis-she appears to be responding well to the IV antibiotic will continue this for now--.  Her creatinine is trending down she is on IV D-5  normal saline will switch this to D5 half-normal saline secondary to rising sodium --labs have been ordered for tomorrow for followup of this.  Her white count now has almost normalized  #2-gout-pseudogout?-She appears to be responding well to prednisone this appears to be improved  At this point she continues to be very frail and fragile-we have been monitoring this closely considering her very guarded prognosis-I did have a discussion with the daughter today as well-things appear to be progressing in a positive direction-at this point the plan is to continue fluids until her creatinine normalizes-will also order updated lab work for Monday, November 17 and will need followup of her labs tomorrow as well   . [id number]  .

## 2013-03-22 ENCOUNTER — Non-Acute Institutional Stay (SKILLED_NURSING_FACILITY): Payer: Medicare Other | Admitting: Internal Medicine

## 2013-03-22 DIAGNOSIS — A419 Sepsis, unspecified organism: Secondary | ICD-10-CM

## 2013-03-22 DIAGNOSIS — D72829 Elevated white blood cell count, unspecified: Secondary | ICD-10-CM | POA: Diagnosis not present

## 2013-03-22 DIAGNOSIS — N179 Acute kidney failure, unspecified: Secondary | ICD-10-CM

## 2013-03-22 DIAGNOSIS — I10 Essential (primary) hypertension: Secondary | ICD-10-CM | POA: Diagnosis not present

## 2013-03-22 DIAGNOSIS — M109 Gout, unspecified: Secondary | ICD-10-CM | POA: Diagnosis not present

## 2013-03-22 DIAGNOSIS — D649 Anemia, unspecified: Secondary | ICD-10-CM | POA: Diagnosis not present

## 2013-03-22 NOTE — Progress Notes (Signed)
Patient ID: [name], female   DOB: August 08, 1935, 77 y.o.   MRN: [id number]  This is an acute visit.  Level care skilled.  Facility [facility].  Chief complaint-acute visit followup failure to thrive cytosis altered mental status acute renal failure suspicions of sepsis.  History of present illness.  Patient is a pleasant 77 year old female with severe dementia who has had a very complicated course here the past couple weeks.  Initially she had lethargy tachycardia and elevated blood pressure-her white count actually went up to over 18,000-lab work including a urinalysis urine culture blood cultures were all essentially negative.  Lab work on November 10 was quite remarkable for a creatinine of 3.95 she does have some history of renal insufficiency with a baseline creatinine in the high ones.  She was started on IV fluids and creatinine has gradually trended down labs today showed improvement--2-0.11 with BUN of 34.  Her leukocytosis also appeared to resolve with IV antibiotic Zosyn--- however updated lab today shows white count has risen slightly from 10.8 yesterday to 12.5 today-.  She also had an significant hypernatremia with sodium in the 150s earlier this week this has trended down in general but did rise a bit yesterday- today's level 148 is comparable to yesterday's reading  Today she appears to be relatively baseline with what I saw yesterday-she is more alert with more of her personality back-however today she is complaining somewhat of more pain than she did yesterday although she does not really localize this--it appears to be more abdominal discomfort however.  Also she's had a bout or 2 of diarrhea.  She is also completing a course of prednisone for suspected pseudogout versus gout right foot and this appears to be resolving fairly unremarkably.  Family medical social history has been reviewed her previous progress notes this week as well as history and physical on  05/19/2010.  Medications have been reviewed.  Review of systems-please see history of present illness patient cannot really give any significant review of systems secondary to severe dementia.  Apparently her appetite has been somewhat better per discussion with her daughter -although continues to be significantly impaired.  Physical exam.  Temperature 98.1-pulse 84-respirations 80-blood pressure 151/73-O2 saturation 100% on room air   In general this is a frail elderly female who appears to be baseline with what she appeared to be yesterday she is alert confused pleasant smiling talking Her skin is warm and dry her face does appear somewhat flushed which is baseline recently.  Her oropharynx clear mucous membranes moist.  Chest is clear to auscultation no labored breathing.  Heart regular rate and rhythm without murmur gallop or rub.  Abdomen continues to be somewhat distended but soft with hyperactive bowel sounds there is a little more tenderness here to palpation diffusely than yesterday.  Muscle skeletal lateral right ankle the area of erythema appears to be less than even yesterday this was thought possibly to be a pseudogout or gout-this appears to be responding to the prednisone.  Neurologic she appears to be at her baseline grossly intact speech is clear with some confusion certainly-no lateralizing findings.  Psych she appears to be close to what her baseline personality is although somewhat weaker.  Labs.  03/22/2013.  WBC 12.5 hemoglobin 10.2 platelets 137.  Sodium 148 potassium 4.3 BUN 34 creatinine 2.11 calcium of 7.9.  02/18/2013.  WBC 10.8 hemoglobin 10.7 platelets 117.  Sodium 148 potassium 4.1 BUN 46 creatinine 2.54.  Assessment and plan.  #1-history of elevated white  count thought to be sepsis of unknown etiology-her white count almost normalized yesterday it is turning a bit up --with the recent diarrhea one would consider possibly about C. difficile  developing --Will up obtain C. difficile cultures of the loose stool-- continues on Zosyn  Also will update a CBC and metabolic panel tomorrow.  #2-history of acute renal failure-this appears to be trending in the right direction she continues on IV fluids half normal saline [id number] update metabolic panel tomorrow her creatinine appears to be nearing her baseline.  #3-history of gout-pseudogout-this appears to be resolving unremarkably on prednisone.  Patient does continue to be in fragile condition with somewhat variable laboratory values although creatinine appears to be headed consistently in the right direction-we will see what the lab work tells [place] tomorrow-- clinically she appears relatively baseline with yesterday's presentation certainly improved from earlier in the week--one would wonder about the possibility of C. difficile may be contributing to her elevated white count and presentation especially in light of the diarrhea we will await culture results.   [id number]

## 2013-03-23 ENCOUNTER — Non-Acute Institutional Stay (SKILLED_NURSING_FACILITY): Payer: Medicare Other | Admitting: Internal Medicine

## 2013-03-23 DIAGNOSIS — R197 Diarrhea, unspecified: Secondary | ICD-10-CM | POA: Diagnosis not present

## 2013-03-23 DIAGNOSIS — I10 Essential (primary) hypertension: Secondary | ICD-10-CM | POA: Diagnosis not present

## 2013-03-23 DIAGNOSIS — A419 Sepsis, unspecified organism: Secondary | ICD-10-CM | POA: Diagnosis not present

## 2013-03-23 DIAGNOSIS — D72829 Elevated white blood cell count, unspecified: Secondary | ICD-10-CM

## 2013-03-23 DIAGNOSIS — N179 Acute kidney failure, unspecified: Secondary | ICD-10-CM | POA: Diagnosis not present

## 2013-03-23 NOTE — Progress Notes (Signed)
Patient ID: [name], female   DOB: 16-Nov-1935, 77 y.o.   MRN: [id number]  This is an acute visit.  Level care skilled.  Facility [facility].  Chief complaint-acute visit followup failure to thrive leukocytosis acute renal failure-diarrhea with suspected sepsis.  History of present illness.  Patient is a pleasant 77 year old female with severe dementia who has had a very challenging week and a half.  She presented last week with lethargy tachycardia elevated blood pressure and a white count that was over 18,000 on the lab work first part of this week.  Urine cultures and blood cultures were essentially negative.  Labwork on November 10 also showed a creatinine of 3.95 which was significant for the above her baseline which is usually in the high ones creatinine.  She was started on IV fluids and her creatinine has trended down--infected is 1.94 today-her sodium continues to be mildly elevated at 148 she is receiving half normal saline at 100 cc an hour.  She was also started on Zosyn earlier this week secondary to suspicions of a  septic process of unknown etiology and her white count did trend down  She's also developed some recurrent diarrhea here the last couple days has had several loose bowel movements today-we have also noted her white count which originally trended down to just above normal range is now slowly rising it was 12.5 yesterday-- it is 13.9 today differential does show a shift to granulocytes with percentage of 84% absolute 11.7.  Apparentlyher blood pressure which had been quite variable t between high levels and low levels is now trending on the higher level with a diastolic which was over [id number] earlier today she was given clonidine the blood pressure I got manually it had come down to 130/80  She continues to appear to have some abdominal discomfort at times-her mental status did improve gradually later this week and this appears to be relatively baseline today but she   looks like she is not feeling quite as well as she did a couple days ago-  Her by mouth intake continues to be poor.  Family medical social history as been reviewed her previous progress notes this week as well as her history and physical on 05/19/1998 well.  Medications have been reviewed.  Review of systems-please see history of present house patient cannot really give any review of systems secondary to severe dementia.  Physical exam.  Temperature is 99.5 pulse 74 respirations 20 blood pressure 130/80 this was taken manually ultra saturation is 98%.  In general this is a frail elderly female in no distress lying comfortably in bed she does appear to be quite weak but she is smiling and alert.  Her skin is warm and dry her face does not have much of a flushed appearance today appears somewhat pale.  Oropharynx is clear mucous membranes moist.  Her chest is clear to auscultation with no labored breathing.  Heart is regular rate and rhythm without murmur gallop or rub.  Her abdomen is somewhat protuberant baseline with previous exams bowel sounds are very active there appears to be some diffuse tenderness somewhat baseline with what it was yesterday.  Muscle skeletal her right ankle area continues to improve with less erythema and again she is on prednisone for this secondary to suspicions  this is pseudogout or gout.  Neurologic appears to be intact cranial nerves intact she is smiling pleasant.  Labs.  03/23/2013.  WBC 13.9 hemoglobin 11.3 platelets 170.  Sodium 148 potassium 3.7  BUN 31 creatinine 1.94.  Assessment and plan.  #1-history of possible sepsis-complicated with acute renal failure-hypertension-hypotension-this continues to be a complex and challenging situation clinically she appears relatively baseline with yesterday-- however her white count is trending up and she is having frequent diarrhea-A C-diff culture has been done will empirically start her on Flagyl 500  mg 3 times a day for concerns with the diarrhea being on an antibiotic and having abdominal discomfort  At this point we'll order a seven-day course-.  Also will order an updated CBC with differential and basic metabolic panel Monday a.m. stat for review by Dr. Dellia Nims when he is in the facility-continue to monitor vital signs closely especially with her history of hypertension complicated with hypotension-at this point rather than restart blood pressure medications will monitor-if there is consistent elevation certainly will reconsider this but since she has some hypotension from earlier in the week would be hesitant to be too aggressive at this point  [id number]

## 2013-03-25 ENCOUNTER — Non-Acute Institutional Stay (SKILLED_NURSING_FACILITY): Payer: Medicare Other | Admitting: Internal Medicine

## 2013-03-25 DIAGNOSIS — A419 Sepsis, unspecified organism: Secondary | ICD-10-CM | POA: Diagnosis not present

## 2013-03-25 DIAGNOSIS — I10 Essential (primary) hypertension: Secondary | ICD-10-CM | POA: Diagnosis not present

## 2013-03-25 DIAGNOSIS — N179 Acute kidney failure, unspecified: Secondary | ICD-10-CM

## 2013-03-25 DIAGNOSIS — D649 Anemia, unspecified: Secondary | ICD-10-CM | POA: Diagnosis not present

## 2013-03-26 ENCOUNTER — Non-Acute Institutional Stay (SKILLED_NURSING_FACILITY): Payer: Medicare Other | Admitting: Internal Medicine

## 2013-03-26 DIAGNOSIS — N179 Acute kidney failure, unspecified: Secondary | ICD-10-CM

## 2013-03-26 DIAGNOSIS — A419 Sepsis, unspecified organism: Secondary | ICD-10-CM

## 2013-03-26 DIAGNOSIS — R197 Diarrhea, unspecified: Secondary | ICD-10-CM

## 2013-03-26 NOTE — Progress Notes (Signed)
Patient ID: [name], female   DOB: 19-Mar-1936, 77 y.o.   MRN: [id number]  This is an acute visit.  Level care skilled.  Facility [facility].  Chief complaint-acute visit followup diarrhea-sepsis-renal insufficiency-leukocytosis.  History of present illness.  Patient is a pleasant 77 year old female who has had quite a complicated course here the past 2 weeks.  Initially presented with lethargy tachycardia elevated blood pressure-white count at one point shot up to over 18,000-urinalysis urine culture and blood cultures were essentially negative.  Her creatinine went up to 3.95 she was started on IV fluids which he continues on.  She also continues on Zosyn for suspicions of sepsis  She's developed some diarrhea this started late last week and she has been started on Flagyl empirically-C. difficile culture so far has been negative.  Her white count did initially come down then went up a bit her recent lab done yesterday this is down again at 11.2 with granulocyte percentage minimally elevated at 78 actually granulocytes 8.8.  Renal function appears to be relatively baseline now with a creatinine of 1.74 BUN of 27 her sodium is somewhat high yet at 147 it has been hovering around this range for several days now.  Clinically her vital signs are stable she had had episodes of elevated blood pressures intermittently but apparently this is stabilized over the weekend.  Her mental status appears to be at baseline she is pleasant smiling but does not look like she feels all that well.  Her by mouth intake consists mainly of fluid --her intake of solids has been fairly minimal despite encouragement by her daughter and staff.  She has complained of abdominal discomfort and Dr. Dellia Nims   saw her yesterday and ordered an abdominal ultrasound for later this week.  Family medical social history as been reviewed per previous progress notes over the last 10 days as well as history and physical on  05/19/1998 well.  Medications have been reviewed per MAR.  Review of systems-limited secondary to severe dementia please see history of present illness she is not complaining specifically of any chest pain shortness of breath at times still appears to have some abdominal discomfort and her appetite remains poor except for fluids.  Physical exam.  Temperature is 97.3 pulse 84 respirations 20 blood pressure 140/83-136/85-.  Gen. this a frail elderly female in no distress lying comfortably in bed appears weak but smiling pleasant.  Her skin is warm and dry.  Oropharynx patient did not open her mouth much because membranes appear to be moist from what I could see.  Her chest is clear to auscultation with no labored breathing.  Heart is regular rate and rhythm without murmur gallop or rub.  Abdomen continues to be somewhat protuberant but soft with very active bowel sounds some tenderness to palpation but this does not appear to be as significant as it did last week.  Musculoskeletal moves all extremities at baseline    Neurologic appears to be intact her cranial nerves are intact.  Psych she has severe dementia but is talkative pleasant smiling appears to be more herself of the week.  Labs  03/25/2013.  WBC 11.2 hemoglobin 11.4 platelets 224.  Sodium 147 potassium 3.7 BUN 27 creatinine 1.74.         03/25/2013.  WBC 13.9 hemoglobin 11.3 platelets 170.  Sodium 148 potassium 31 creatinine 1.94.  Assessment and plan.  #1-history of sepsis with elevated white count-unknown etiology nonetheless white count appears now to be trending down-we have  obtained C. difficile culture so far these have been negative she does continue to have diarrhea although apparently this is lessening over the past day or so with about 2 episodes so far today.  She does continue on Flagyl as well as Zosyn-.  [name]  #2-renal insufficiency-she had acute renal failure but this has responded to IV  fluids her creatinine now is relatively at her baseline at 1.74--- she does have an abdominal ultrasound ordered for tomorrow we'll await those results in the meantime we'll continue IV fluids.  [id number]

## 2013-03-27 ENCOUNTER — Ambulatory Visit (HOSPITAL_COMMUNITY): Payer: Medicare Other

## 2013-03-27 ENCOUNTER — Non-Acute Institutional Stay (SKILLED_NURSING_FACILITY): Payer: Medicare Other | Admitting: Internal Medicine

## 2013-03-27 ENCOUNTER — Ambulatory Visit (HOSPITAL_COMMUNITY)
Admit: 2013-03-27 | Discharge: 2013-03-27 | Disposition: A | Discharge status: Home / Self Care | Payer: Medicare Other | Attending: Internal Medicine | Admitting: Internal Medicine

## 2013-03-27 DIAGNOSIS — A419 Sepsis, unspecified organism: Secondary | ICD-10-CM

## 2013-03-27 DIAGNOSIS — N289 Disorder of kidney and ureter, unspecified: Secondary | ICD-10-CM | POA: Insufficient documentation

## 2013-03-27 DIAGNOSIS — K8020 Calculus of gallbladder without cholecystitis without obstruction: Secondary | ICD-10-CM | POA: Insufficient documentation

## 2013-03-27 DIAGNOSIS — N179 Acute kidney failure, unspecified: Secondary | ICD-10-CM | POA: Diagnosis not present

## 2013-03-27 DIAGNOSIS — R634 Abnormal weight loss: Secondary | ICD-10-CM | POA: Diagnosis not present

## 2013-03-28 ENCOUNTER — Ambulatory Visit (HOSPITAL_COMMUNITY): Payer: Medicare Other

## 2013-03-28 NOTE — Progress Notes (Signed)
Patient ID: [name], female   DOB: 03/20/1936, 77 y.o.   MRN: [id number]           PROGRESS NOTE  DATE:  03/27/2013    FACILITY: [place]    LEVEL OF CARE:   SNF   Acute Visit   CHIEF COMPLAINT:  Follow up of abdominal pain, elevated white count, acute renal failure, hypernatremia.    HISTORY OF PRESENT ILLNESS:  This is a patient whom we have been following carefully in the facility over the last two weeks.  She is a 77 year-old woman with advanced dementia.  Her daughter is the dietitian in the facility.    I have treated her over the last 7-10 days with empiric intravenous zosyn due to abdominal pain and an elevated white count.  The elevated white count corrected and she has generally done some better.    We also corrected her hypernatremia and prerenal azotemia with intravenous isotonic and then free fluid.  Her lab work has been generally better.    However, it is noted that although she is more alert, she simply has not come back to her premorbid status.  She is not eating but she is drinking, according to her daughter.  She has also developed vomiting and diarrhea.  This has been tested for C.diff toxin x1, which was negative.  She was started on Flagyl, however.    REVIEW OF SYSTEMS:  Otherwise not possible in this woman with severe dementia.    PHYSICAL EXAMINATION:   VITAL SIGNS:   TEMPERATURE:  97.3.   RESPIRATIONS:  18.   PULSE:  87.   BLOOD PRESSURE:  148/78.   O2 SATURATIONS:  93% on room air.   GENERAL APPEARANCE:  She is more alert.   CHEST/RESPIRATORY:  Clear air entry bilaterally.   CARDIOVASCULAR:  CARDIAC:   Heart sounds are normal.  She appears to be euvolemic.   GASTROINTESTINAL:  ABDOMEN:   Slightly distended.  However, bowel sounds are positive.  She is, however, still diffusely tender but without guarding or rebound.   GENITOURINARY:  BLADDER:   No suprapubic or costovertebral angle tenderness is noted.  She has a Foley catheter in.    CIRCULATION:   EDEMA/VARICOSITIES:  Extremities:  No evidence of a DVT.  No edema.    ASSESSMENT/PLAN:  Abdominal pain with elevated white count.  The source of this has remained unclear.  I did do an ultrasound of her abdomen.  This showed multiple stones within the gallbladder, but no gallbladder wall thickening or pericholecystic fluid.  Negative sonographic Murphy's sign.  Her common bile duct diameter was 5.7.  There are no other abnormalities seen.  There was no evidence of hydronephrosis.  I suspect what we are dealing with here is still an abdominal process.  Appendicitis, intestinal ischemia, or perhaps a passed gallbladder stone or sludge is all possible here.  This would require a CT scan of the abdomen with oral and IV contrast to be more certain about.  I have discussed this in detail with her daughter today, who is present.  We will wait until next week, I think, for making a final decision, although she is leaning towards more comfort-mediated care if what we have done so far does not result in improvement in her oral intake and/or return of her original functional status.    Acute-on-chronic renal failure.  Secondary to dehydration.  This has actually been corrected.    Diarrhea, nausea and vomiting  in the setting of a negative stool assay for C.difficile toxin.  This effectively rules this out, although I am going to continue her Flagyl at this point.  I have started her on Imodium.  It is possible that this may represent intestinal ischemia.  Certainly, she is at risk for this.  Diarrhea certainly can come from this, as can vomiting, elevated white count with very little findings at the bedside.    We have improved this patient, although I am still not thinking that we have resolved the issue.  She has not returned to her premorbid functional status and she is eating very poorly.  I will re-discuss this with her daughter next week.  Lab work has been ordered for Monday.  I have stopped  her zosyn, IV fluid.  I will keep the PICC line intact.    CPT CODE: 65784

## 2013-03-28 NOTE — Progress Notes (Signed)
Patient ID: [name], female   DOB: [birth year], 77 y.o.   MRN: [id number]           PROGRESS NOTE  DATE:  03/25/2013  FACILITY: [facility]    LEVEL OF CARE:   SNF   Acute Visit   CHIEF COMPLAINT:  Follow up of hypotension, hypernatremia.    HISTORY OF PRESENT ILLNESS:   This is a patient with advanced dementia who has been unwell over the last two weeks.  She went into acute renal failure with lab work and clinical exam suggesting an infection, although I do not think we ever really exactly determined the source of this.  I treated her empirically with zosyn and copious amounts of IV fluid.  Her white count has come down to 13.9 from a high of 19, although this is actually slightly higher than last week.  Differential count of 84%.    Her sodium today is 148, BUN of 31, creatinine of 1.94.  It would appear that she has some degree of baseline chronic renal failure with a baseline creatinine of roughly 1.8.     PHYSICAL EXAMINATION:   VITAL SIGNS:   BLOOD PRESSURE:  As I understand things, her blood pressure is actually elevated now.   CHEST/RESPIRATORY:  Shallow, but otherwise clear air entry.   CARDIOVASCULAR:  CARDIAC:   Heart sounds are normal.  She appears to be euvolemic.   GASTROINTESTINAL:  ABDOMEN:   Still distended.  Bowel sounds are positive.  Again, she continues to have left lower quadrant tenderness, left greater than right, although this is very difficult to be precisely certain about.    ASSESSMENT/PLAN:  Septic shock.  The exact source of this is unclear.  She has made some improvement, although she still has an elevated white count.  I think there is some suggestion that she may now be having diarrhea.  She was started on Flagyl.  She continues on zosyn.    Acute-on-chronic renal insufficiency.  She is on D5 half-normal saline at 100.  I am going to change her to D5W.  I am also going to send her for a left lower quadrant ultrasound, especially of the  left kidney since it has been a source of tenderness.    CPT CODE: 24401

## 2013-04-01 ENCOUNTER — Non-Acute Institutional Stay (SKILLED_NURSING_FACILITY): Payer: Medicare Other | Admitting: Internal Medicine

## 2013-04-01 DIAGNOSIS — N179 Acute kidney failure, unspecified: Secondary | ICD-10-CM

## 2013-04-01 NOTE — Progress Notes (Signed)
Patient ID: [name], female   DOB: 1935/09/29, 77 y.o.   MRN: [id number]           PROGRESS NOTE  DATE:  04/01/2013    FACILITY: [place]    LEVEL OF CARE:   SNF   Acute Visit   CHIEF COMPLAINT:  Follow up of abdominal pain, elevated white count, acute renal failure, hypernatremia.    HISTORY OF PRESENT ILLNESS:  This is a patient whom we have been following carefully in the facility over the last two weeks.  She is a 77 year-old woman with advanced dementia.  Her daughter is the dietitian in the facility.    I have treated her over the last 7-10 days with empiric intravenous zosyn due to abdominal pain and an elevated white count.  The elevated white count corrected and she has generally done some better.  He had some consistent left lower quadrant abdominal tenderness which left me the impression that this could be an acute diverticulitis and/or ischemic bowel disease. I discontinued the IV antibiotics on November 19. Per discussion with the nursing staff her intake of nutrition seems to be very poor however she is drinking. He also corrected her acute renal failure probably secondary to prerenal factors as well as septic shock.  Lab work from this morning shows a white count of 10.480% neutrophils hemoglobin and platelet count are normal. Her comprehensive metabolic panel shows a BUN of 29 a creatinine of 1.57 her serum albumin is 2.7 indicative of moderate to severe protein calorie malnutrition  REVIEW OF SYSTEMS:  Otherwise not possible in this woman with severe dementia.    PHYSICAL EXAMINATION:   VITAL SIGNS:   O2 SATURATIONS:  93% on room air.   GENERAL APPEARANCE:  She is more alert.   CHEST/RESPIRATORY:  Clear air entry bilaterally.   CARDIOVASCULAR:  CARDIAC:   Heart sounds are normal.  She appears to be euvolemic.   GASTROINTESTINAL:  ABDOMEN:   Slightly distended.  However bowel sounds are positive. There is absolutely no left lower cord or tenderness  today that marked my findings on the acute part of this illness.  GENITOURINARY:  BLADDER:   No suprapubic or costovertebral angle tenderness is noted.  She has a Foley catheter in.   CIRCULATION:   EDEMA/VARICOSITIES:  Extremities:  No evidence of a DVT.  No edema.   We have improved this patient, although I am still not thinking that we have resolved the issue.  She has not returned to her premorbid functional status and she is eating very poorly.  I will re-discuss this with her daughter next week.  Lab work has been ordered for Monday.  I have stopped her zosyn, IV fluid.  I will keep the PICC line intact.    Pression/plan #1 abdominal pain elevated white count. This could have been diverticulitis, ischemic colitis etc. No she appears to have made a recovery and except for her declined intake of nutrition things seem more stable. We're going to start getting her up in a chair for meals #2 acute renal failure this appears to have resolved she is doing  Things seem to stabilize for this patient. The length of time she will remain stable and is largely governed by her oral intake.

## 2013-04-02 ENCOUNTER — Non-Acute Institutional Stay (SKILLED_NURSING_FACILITY): Payer: Medicare Other | Admitting: Internal Medicine

## 2013-04-02 DIAGNOSIS — R509 Fever, unspecified: Secondary | ICD-10-CM | POA: Diagnosis not present

## 2013-04-02 DIAGNOSIS — R1110 Vomiting, unspecified: Secondary | ICD-10-CM | POA: Insufficient documentation

## 2013-04-02 NOTE — Progress Notes (Signed)
Patient ID: [name], female   DOB: 10/10/1935, 77 y.o.   MRN: [id number]  This is an acute visit.  Level of care skilled.  Facility [facility].  Chief complaint-acute visit secondary to low-grade temperature-vomiting.  History of present illness.  Patient is a pleasant elderly resident with advanced dementia who has been unwell for the last couple weeks.  She went into acute renal failure and clinical exams suggested infection although no clear etiology was found.  There was some thought this could have been an acute diverticulitis or ischemic bowel event since she had some left-sided abdominal tenderness.  Nonetheless she appeared to respond well to the IV fluids as well as the Zosyn although she did not and has not returned to her baseline-she continues to have poor by mouth intake but does take fluids well.  Her Zosyn and IV fluids have been discontinued.  Her labs have normalized back to her baseline with essentially a normal white count.  Apparently at some point today she did develop a low-grade temperature of 100.5 according to nursing staff she also apparently had a couple episodes of vomiting yesterday apparently none noted today.  When I saw her today she was eating a bit although appeared again take her fluids much better than she her solid food.  She appeared to be at her baseline mentally smiling alert certainly did not appear to be in distress.  Family medical social history has been per history and physical on 05/19/2010 and previous progress notes most recently 04/01/2013.  Medications have been reviewed.  Review of systems essentially unobtainable please see history of present illness per nursing staff she is not complaining of any chest pain shortness of breath or abdominal discomfort they have noted a couple vomiting episodes yesterday.  Physical exam.  Temperature is 100.5 pulse 86 respirations 16 blood pressure 136/88.  In general this is a pleasant elderly  female in no distress resting comfortably in bed she is alert smiling confused but pleasant.  Her skin is warm and dry.  Chest is clear to auscultation with no labored breathing.  Heart is regular rate and rhythm without murmur gallop or rub.  Abdomen is soft I could not really appreciate any tenderness today all sounds are very active.  GU she does have a Foley catheter draining light amber colored urine I cannot really appreciate any suprapubic tenderness.  Extremities moves all at baseline I do not see any edema of significance.  Labs.  04/01/2013  WBC 10.4 hemoglobin 12.6 platelets 256.  Sodium 140 potassium 4.2 BUN 29 creatinine 1.57.  Liver function test within normal limits except albumin of 2.7 bilirubin 0.2.  Assessment and plan.  Low-grade fever-I did discuss this with Dr. Dellia Nims who saw the patient yesterday-at this point she appears clinically stable vital signs other than elevated temperature are stable-this is a very challenging situation but clinically she appears stable-at this point we'll continue to monitor with vital signs every shift pulse ox-- if temperature  Elevation  is persistent or clinically she appears to be deteriorating certainly will have to be addressed expediently-- Dr. Dellia Nims has already ordered labs CBC and CMP for first lab day next week Of note I also did discuss with her daughter who is the dietitian in the facility-spatient had been on Reglan before for gastroparesis-this has been gradually titrated off per patient stability and attempt to minimize her medications as well as her daughter's wishes-her daughter was wondering about possibly at some point restarting this-at this point  we'll continue to monitor i and  See how she does clinically.   [id number]

## 2013-04-08 ENCOUNTER — Non-Acute Institutional Stay (SKILLED_NURSING_FACILITY): Payer: Medicare Other | Admitting: Internal Medicine

## 2013-04-08 DIAGNOSIS — F0391 Unspecified dementia with behavioral disturbance: Secondary | ICD-10-CM | POA: Diagnosis not present

## 2013-04-08 DIAGNOSIS — F0390 Unspecified dementia without behavioral disturbance: Secondary | ICD-10-CM | POA: Diagnosis not present

## 2013-04-08 DIAGNOSIS — R262 Difficulty in walking, not elsewhere classified: Secondary | ICD-10-CM | POA: Diagnosis not present

## 2013-04-08 DIAGNOSIS — I10 Essential (primary) hypertension: Secondary | ICD-10-CM | POA: Diagnosis not present

## 2013-04-08 DIAGNOSIS — F319 Bipolar disorder, unspecified: Secondary | ICD-10-CM | POA: Diagnosis not present

## 2013-04-08 DIAGNOSIS — F068 Other specified mental disorders due to known physiological condition: Secondary | ICD-10-CM | POA: Diagnosis not present

## 2013-04-08 DIAGNOSIS — R634 Abnormal weight loss: Secondary | ICD-10-CM | POA: Diagnosis not present

## 2013-04-08 DIAGNOSIS — R41841 Cognitive communication deficit: Secondary | ICD-10-CM | POA: Diagnosis not present

## 2013-04-08 DIAGNOSIS — D649 Anemia, unspecified: Secondary | ICD-10-CM | POA: Diagnosis not present

## 2013-04-08 DIAGNOSIS — R1110 Vomiting, unspecified: Secondary | ICD-10-CM | POA: Diagnosis not present

## 2013-04-09 DIAGNOSIS — I10 Essential (primary) hypertension: Secondary | ICD-10-CM | POA: Diagnosis not present

## 2013-04-09 DIAGNOSIS — F319 Bipolar disorder, unspecified: Secondary | ICD-10-CM | POA: Diagnosis not present

## 2013-04-09 DIAGNOSIS — R262 Difficulty in walking, not elsewhere classified: Secondary | ICD-10-CM | POA: Diagnosis not present

## 2013-04-09 DIAGNOSIS — R41841 Cognitive communication deficit: Secondary | ICD-10-CM | POA: Diagnosis not present

## 2013-04-09 DIAGNOSIS — F0391 Unspecified dementia with behavioral disturbance: Secondary | ICD-10-CM | POA: Diagnosis not present

## 2013-04-09 DIAGNOSIS — F068 Other specified mental disorders due to known physiological condition: Secondary | ICD-10-CM | POA: Diagnosis not present

## 2013-04-10 DIAGNOSIS — F319 Bipolar disorder, unspecified: Secondary | ICD-10-CM | POA: Diagnosis not present

## 2013-04-10 DIAGNOSIS — F068 Other specified mental disorders due to known physiological condition: Secondary | ICD-10-CM | POA: Diagnosis not present

## 2013-04-10 DIAGNOSIS — I10 Essential (primary) hypertension: Secondary | ICD-10-CM | POA: Diagnosis not present

## 2013-04-10 DIAGNOSIS — R41841 Cognitive communication deficit: Secondary | ICD-10-CM | POA: Diagnosis not present

## 2013-04-10 DIAGNOSIS — F0391 Unspecified dementia with behavioral disturbance: Secondary | ICD-10-CM | POA: Diagnosis not present

## 2013-04-10 DIAGNOSIS — R262 Difficulty in walking, not elsewhere classified: Secondary | ICD-10-CM | POA: Diagnosis not present

## 2013-04-11 DIAGNOSIS — R41841 Cognitive communication deficit: Secondary | ICD-10-CM | POA: Diagnosis not present

## 2013-04-11 DIAGNOSIS — F0391 Unspecified dementia with behavioral disturbance: Secondary | ICD-10-CM | POA: Diagnosis not present

## 2013-04-11 DIAGNOSIS — F319 Bipolar disorder, unspecified: Secondary | ICD-10-CM | POA: Diagnosis not present

## 2013-04-11 DIAGNOSIS — I10 Essential (primary) hypertension: Secondary | ICD-10-CM | POA: Diagnosis not present

## 2013-04-11 DIAGNOSIS — R262 Difficulty in walking, not elsewhere classified: Secondary | ICD-10-CM | POA: Diagnosis not present

## 2013-04-11 DIAGNOSIS — F068 Other specified mental disorders due to known physiological condition: Secondary | ICD-10-CM | POA: Diagnosis not present

## 2013-04-12 DIAGNOSIS — R262 Difficulty in walking, not elsewhere classified: Secondary | ICD-10-CM | POA: Diagnosis not present

## 2013-04-12 DIAGNOSIS — F0391 Unspecified dementia with behavioral disturbance: Secondary | ICD-10-CM | POA: Diagnosis not present

## 2013-04-12 DIAGNOSIS — F319 Bipolar disorder, unspecified: Secondary | ICD-10-CM | POA: Diagnosis not present

## 2013-04-12 DIAGNOSIS — I10 Essential (primary) hypertension: Secondary | ICD-10-CM | POA: Diagnosis not present

## 2013-04-12 DIAGNOSIS — R41841 Cognitive communication deficit: Secondary | ICD-10-CM | POA: Diagnosis not present

## 2013-04-12 DIAGNOSIS — F068 Other specified mental disorders due to known physiological condition: Secondary | ICD-10-CM | POA: Diagnosis not present

## 2013-04-15 ENCOUNTER — Non-Acute Institutional Stay (SKILLED_NURSING_FACILITY): Payer: Medicare Other | Admitting: Internal Medicine

## 2013-04-15 DIAGNOSIS — I10 Essential (primary) hypertension: Secondary | ICD-10-CM | POA: Diagnosis not present

## 2013-04-15 DIAGNOSIS — D649 Anemia, unspecified: Secondary | ICD-10-CM | POA: Diagnosis not present

## 2013-04-15 DIAGNOSIS — R262 Difficulty in walking, not elsewhere classified: Secondary | ICD-10-CM | POA: Diagnosis not present

## 2013-04-15 DIAGNOSIS — R41841 Cognitive communication deficit: Secondary | ICD-10-CM | POA: Diagnosis not present

## 2013-04-15 DIAGNOSIS — R634 Abnormal weight loss: Secondary | ICD-10-CM

## 2013-04-15 DIAGNOSIS — F0391 Unspecified dementia with behavioral disturbance: Secondary | ICD-10-CM | POA: Diagnosis not present

## 2013-04-15 DIAGNOSIS — F068 Other specified mental disorders due to known physiological condition: Secondary | ICD-10-CM | POA: Diagnosis not present

## 2013-04-15 DIAGNOSIS — F319 Bipolar disorder, unspecified: Secondary | ICD-10-CM | POA: Diagnosis not present

## 2013-04-15 NOTE — Progress Notes (Signed)
Patient ID: [name], female   DOB: [birth year], 77 y.o.   MRN: [id number] [facility] Chief complaint; followup declining oral intake History; this is a patient I've been seeing fairly frequently for the last 2-3 weeks with a recent history of acute renal failure, abdominal pain and leukocytosis. She was rehydrated and treated with empiric antibiotics. The exact cause of all of this was never really determined. An abdominal ultrasound showed gallstones although this was not definitively the etiology. At that time she continued to have left lower quadrant tenderness. This seemed to resolve with antibiotic therapy. Unfortunately she has not really rebounded in terms of her oral intake. Her daughter was the dietitian the facility reports that she is maybe eating to bites. She has lost 10 pounds last month.  Review of system; the report is that she will let put food in her mouth and then she just doesn't seem to be able to swallow it. She will spit it out. Speech therapy has been working with her although they are not able to make any progress. She drinks somewhat better  Physical exam; which are 97 to pulse 65 blood pressure 139/80 respirations 18 General the patient is not in any distress. Although she seems to be uncomfortable when she is examined in any way up. Respiratory shallow but otherwise clear entry bilaterally Cardiac I suspect she is already dehydrating Abdomen; mildly distended in the distention seems to be in the upper parts of her abdomen. Bowel sounds are positive she is tender again mostly in the left lower quadrant although I just can't localize this to any further degree  Impression/plan #1 weight loss and probably some degree of dehydration. I suspect there is an issue here which is intra-abdominal. Her abdominal ultrasound did show gallstones which may be doing part of this however at the time of her examination on November 11 there was no gallbladder wall thickening or  pericholecystic fluid. I think the next step here would be a CT scan with IV and by mouth contrast however I don't think she is a candidate for this due to the advanced state of her dementia. Discussed this with the daughter I am going to try to move towards comfort care. I am going to order Roxanol at 2.5 mg every 6 when necessary. She appears to be already dehydrated. I am not planning to do any further lab work at this point.

## 2013-04-16 DIAGNOSIS — I10 Essential (primary) hypertension: Secondary | ICD-10-CM | POA: Diagnosis not present

## 2013-04-16 DIAGNOSIS — R262 Difficulty in walking, not elsewhere classified: Secondary | ICD-10-CM | POA: Diagnosis not present

## 2013-04-16 DIAGNOSIS — F068 Other specified mental disorders due to known physiological condition: Secondary | ICD-10-CM | POA: Diagnosis not present

## 2013-04-16 DIAGNOSIS — F0391 Unspecified dementia with behavioral disturbance: Secondary | ICD-10-CM | POA: Diagnosis not present

## 2013-04-16 DIAGNOSIS — F319 Bipolar disorder, unspecified: Secondary | ICD-10-CM | POA: Diagnosis not present

## 2013-04-16 DIAGNOSIS — R41841 Cognitive communication deficit: Secondary | ICD-10-CM | POA: Diagnosis not present

## 2013-04-17 DIAGNOSIS — F319 Bipolar disorder, unspecified: Secondary | ICD-10-CM | POA: Diagnosis not present

## 2013-04-17 DIAGNOSIS — R41841 Cognitive communication deficit: Secondary | ICD-10-CM | POA: Diagnosis not present

## 2013-04-17 DIAGNOSIS — R262 Difficulty in walking, not elsewhere classified: Secondary | ICD-10-CM | POA: Diagnosis not present

## 2013-04-17 DIAGNOSIS — F068 Other specified mental disorders due to known physiological condition: Secondary | ICD-10-CM | POA: Diagnosis not present

## 2013-04-17 DIAGNOSIS — I10 Essential (primary) hypertension: Secondary | ICD-10-CM | POA: Diagnosis not present

## 2013-04-17 DIAGNOSIS — F0391 Unspecified dementia with behavioral disturbance: Secondary | ICD-10-CM | POA: Diagnosis not present

## 2013-04-18 DIAGNOSIS — F319 Bipolar disorder, unspecified: Secondary | ICD-10-CM | POA: Diagnosis not present

## 2013-04-18 DIAGNOSIS — R262 Difficulty in walking, not elsewhere classified: Secondary | ICD-10-CM | POA: Diagnosis not present

## 2013-04-18 DIAGNOSIS — F0391 Unspecified dementia with behavioral disturbance: Secondary | ICD-10-CM | POA: Diagnosis not present

## 2013-04-18 DIAGNOSIS — F068 Other specified mental disorders due to known physiological condition: Secondary | ICD-10-CM | POA: Diagnosis not present

## 2013-04-18 DIAGNOSIS — I10 Essential (primary) hypertension: Secondary | ICD-10-CM | POA: Diagnosis not present

## 2013-04-18 DIAGNOSIS — R41841 Cognitive communication deficit: Secondary | ICD-10-CM | POA: Diagnosis not present

## 2013-04-19 DIAGNOSIS — I10 Essential (primary) hypertension: Secondary | ICD-10-CM | POA: Diagnosis not present

## 2013-04-19 DIAGNOSIS — F068 Other specified mental disorders due to known physiological condition: Secondary | ICD-10-CM | POA: Diagnosis not present

## 2013-04-19 DIAGNOSIS — F0391 Unspecified dementia with behavioral disturbance: Secondary | ICD-10-CM | POA: Diagnosis not present

## 2013-04-19 DIAGNOSIS — R41841 Cognitive communication deficit: Secondary | ICD-10-CM | POA: Diagnosis not present

## 2013-04-19 DIAGNOSIS — F319 Bipolar disorder, unspecified: Secondary | ICD-10-CM | POA: Diagnosis not present

## 2013-04-19 DIAGNOSIS — R262 Difficulty in walking, not elsewhere classified: Secondary | ICD-10-CM | POA: Diagnosis not present

## 2013-04-21 NOTE — Progress Notes (Addendum)
Patient ID: [name], female   DOB: 10-Oct-1935, 77 y.o.   MRN: [id number]           PROGRESS NOTE  DATE:  04/08/2013    FACILITY: [facility]    LEVEL OF CARE:   SNF   Acute Visit   CHIEF COMPLAINT:  Follow up vomiting, failure to thrive.    HISTORY OF PRESENT ILLNESS:   Patient with advanced dementia who has had a very difficult November including acute renal failure and clinical exam suggesting an infection, although we never did really localize the source of this.   She was treated with Zosyn and copious amounts of IV fluid.  Her white count came down from a high of 19, although this is again without a clear source.  She also was in acute renal failure which responded to IV fluids.    As I understand things, she has nausea and vomiting, although her lab work from today actually looks fairly good.  Her sodium is 140, BUN of 31, and creatinine of 1.41, I think at her baseline chronic renal insufficiency.  Her CBC shows a white count of 9.8, hemoglobin of 12.8, platelet count is 190.  Differential count appears normal.     PHYSICAL EXAMINATION:   GENERAL APPEARANCE:  The patient does not look to be in any distress, although she appears to have a K-basin.   GASTROINTESTINAL:  ABDOMEN:   Slightly distended.  However, bowel sounds are positive.  No masses.  No clear tenderness.   LIVER/SPLEEN/KIDNEYS:  No liver, no spleen.   CARDIOVASCULAR:  CARDIAC:   Heart sounds are normal.  She appears to be euvolemic.    ASSESSMENT/PLAN:  Nausea and vomiting.  Once again, the source of this is not clear.  She does have cholelithiasis which played an uncertain role in her initial acute illness.  If it is continuing to play a role, I am uncertain.  We have made her comfort care in the facility, given her advanced dementia.  However, ultimately I will need to discuss this with her daughter if she is unable to make gains in her nutrition and hydration.  So far, she appears to be fairly stable.     CPT CODE: 09811

## 2013-04-29 DIAGNOSIS — N390 Urinary tract infection, site not specified: Secondary | ICD-10-CM | POA: Diagnosis not present

## 2013-04-30 ENCOUNTER — Non-Acute Institutional Stay (SKILLED_NURSING_FACILITY): Payer: Medicare Other | Admitting: Internal Medicine

## 2013-04-30 DIAGNOSIS — F03918 Unspecified dementia, unspecified severity, with other behavioral disturbance: Secondary | ICD-10-CM

## 2013-04-30 DIAGNOSIS — A419 Sepsis, unspecified organism: Secondary | ICD-10-CM

## 2013-04-30 DIAGNOSIS — I10 Essential (primary) hypertension: Secondary | ICD-10-CM | POA: Diagnosis not present

## 2013-04-30 DIAGNOSIS — F0391 Unspecified dementia with behavioral disturbance: Secondary | ICD-10-CM

## 2013-04-30 DIAGNOSIS — C50919 Malignant neoplasm of unspecified site of unspecified female breast: Secondary | ICD-10-CM

## 2013-04-30 DIAGNOSIS — R627 Adult failure to thrive: Secondary | ICD-10-CM | POA: Diagnosis not present

## 2013-04-30 DIAGNOSIS — C50911 Malignant neoplasm of unspecified site of right female breast: Secondary | ICD-10-CM

## 2013-04-30 NOTE — Progress Notes (Signed)
Patient ID: PRANISHA CHANNEL, female   DOB: 05-Aug-1935, 77 y.o.   MRN: [id number]  This is a routine visit.  Level of care skilled.  Facility [facility].  Chief complaint-medical management of failure to thrive history of sepsis-dementia-breast cancer-hypertension-history of gastroparesis.  History of present illness.  Patient is a 77 year old female with the above diagnoses she is essentially under mostly comfort care secondary to failure to thrive issues-this has been somewhat complicated with a septic appearance that has responded to antibiotics in the past although no clear etiology has been found.  Abdominal ultrasound was ordered secondary to some abdominal discomfort which showed gallstones but no gallbladder thickening or fluid that would indicate possibly an acute process.  A CT scan was considered for followup but her daughter who is a dietitian in the facility has expressed desires for more comfort care.  She does have a history of dementia with behaviors and her Depakote has been held I suspect secondary to sedation concerns in her behaviors have been fairly stable recently She used to ambulate about the facility but now has largely stayed in bed or gets up in a [place] chair  She does have an indwelling Foley catheter this was initially started it appears to monitor her fluid output during her acute episodes-her daughter would like it discontinued if possible.  She apparently is taking fluids somewhat better.  For now she has developed a low-grade temperature of 99.2 urinalysis and culture have been ordered these are pending.  Today she appears to be at her baseline she is alert but confused pleasant smiling does not appear to be in any distress.  Of note there have been desires to minimize her medications and her Reglan has been discontinued.  Her Lopressor also is being held I suspect to concerns of hypotension.  Recent blood pressures have been stable 125/77-137/92-pulses have been  in the 60s and 70s.  She does continue on Norvasc.  She also has a history of breast cancer on the right-she has been on tamoxifen long-term however apparently this cannot be crushed and this is  theway patient is taking her medications now-I did discuss this with her daughter and we will discontinue this again secondary to desires for more comfort care  She is receiving Roxanol as needed for pain as well as Tylenol this appears to be helping.  Family medical social history has been reviewed per admission note on 05/19/2010.  Medications have been reviewed per MAR.  Review of systems.  Limited secondary to dementia but according to staff and family she appears to be comfortable and is not really complaining of acute pain or discomfort she has good days and bad days according to her daughter today appears to be a pretty good day she is bright alert smiling. Her daughter says she has not had a bowel movement in a couple of days-she had been on MiraLax previously this was discontinued secondary to previous diarrhea  Physical exam.  Temperature is 99.2 pulse 68 respirations 20 blood pressure 137/92.  Last noted weight is 136.2 this was done earlier this month and appear to be stable with last month's weight   In general this is a pleasant elderly female in no distress resting comfortably in bed she is bright alert smiling confused which is her baseline.  Her skin is warm and dry somewhat pale.  Oropharynx clear mucous membranes moist.  Chest is clear to auscultation no labored breathing.  There is poor respiratory effort however.  Heart is regular  rate and rhythm without murmur gallop or rub.  Her abdomen is somewhat protuberant but soft nontender with active bowel sounds.  GU she does have a Foley catheter draining amber colored urine I do not see any suprapubic distention or acute tenderness to palpation.  Muscle skeletal has general frailty but moves her extremities at baseline  there is no lower extremity edema.  Neurologic appears grossly intact no focal deficits cranial nerves intact.  Labs  Urinalysis obtained yesterday shows many bacteria positive nitrite large leukocyte esterase.  04/08/2013.   sodium 140 potassium 3.7 BUN 31 creatinine 1.41.  Liver function tests within normal limits except albumin of 2.9.  WBC 9.8 hemoglobin 12.  platelets 190   Assessment and plan.  #1s failure to thrive-patient actually appears to have stabilized somewhat again she is essentially under mostly comfort care measures with no real aggressive treatments as noted above-she appears to be at her baseline but certainly weaker not ambulatory at this point.- continues to take supplements although apparently her by mouth intake especially in regards to food  is challenging  Secondary to her daughter's wishes we'll remove the Foley catheter since apparently she is drinking somewhat better continue to monitor her output apparently there is not a issue with urinary retention--and a catheter can be a source of infection.--Will await urine culture results for now  #2-history of sepsis-unknown etiology-her white count actually has normalized and blood work as of earlier this month looked quite stable considering what it has looked like in the past at this point we'll continue to monitor.  #3-hypertension this appears stable she is on Norvasc-- Lopressor DC'd secondary to? low readings/bradycardia-- she did have some tachycardia at one point but this has not been the case for a while.  #4-history of breast cancer-again daughter has expressed desires for more comfort care-she is comfortable with discontinuing the tamoxifen.  #5-dementia with behaviors-she continues to have significant dementia however behaviors have not really been an issue it appears she can be agitated at times but this has not been a real big issue to my knowledge  #6-constipation-we will restart the MiraLax when  necessary and monitor.  [id number].    [name]    [name]

## 2013-05-02 ENCOUNTER — Non-Acute Institutional Stay (SKILLED_NURSING_FACILITY): Payer: Medicare Other | Admitting: Internal Medicine

## 2013-05-02 DIAGNOSIS — N390 Urinary tract infection, site not specified: Secondary | ICD-10-CM

## 2013-05-02 NOTE — Progress Notes (Signed)
Patient ID: [name], female   DOB: 09-03-1935, 77 y.o.   MRN: [id number]  This is an acute visit.  Level of care skilled.  Facility [facility].  Chief complaint asked acute visit followup urine culture.  History of present illness.  Patient is an elderly female here who apparently has had some recent increased confusion.  She is quite involved medical history including sepsis of relatively unknown origin at times significantly elevated white count and creatinine-however this appears to respond to antibiotics at times although she appears to be gradually declining generally She is now under mostly comfort care measures.  Her daughter is concerned thinking she may have a urinary tract infection since she is at somewhat increased confusion from her baseline I did review her vital signs in appears that time she does have a low-grade temperature--earlier this week at one point was 100.2.  We did order a urine culture which has come back positive for Escherichia coli.  Currently her vital signs are stable she is afebrile.  [facility] social history reviewed.  Medications reviewed.  Review of systems.  Limited secondary to dementia but according to her she said she appears to be relatively at her baseline-she is sitting in her chair he care right alert confused.  Physical exam.  Temperature is 98.8 currently pulse 83 respirations 18 blood pressure 134/76.  In general this is a somewhat frail elderly female in no distress sitting comfortably in her chair.  Her skin is warm and dry.  Heart is regular rate and rhythm without murmur gallop or rub.  Chest is clear to auscultation old labored breathing.  Abdomen is somewhat protuberant soft does not appear to be tender.  GU-she has had her catheter removed there is possibly a small amount of suprapubic tenderness here.  Labs    04/08/2013.  Sodium 140 potassium 3.7 BUN 31 creatinine 1.41.  Liver function tests within normal  limits except albumin of 2.9.  WBC 9.8 hemoglobin 12.8 platelets 190.  Assessment and plan.  #1-UTI-he does have a low-grade temperature at times and appears to have at times suprapubic tenderness-Will treat with Rocephin 1 g IM daily x7 days and probiotic twice a day x7 days.  [id number].

## 2013-05-10 ENCOUNTER — Other Ambulatory Visit: Payer: Self-pay | Admitting: *Deleted

## 2013-05-10 MED ORDER — MORPHINE SULFATE 20 MG/5ML PO SOLN: ORAL | Status: DC

## 2013-05-10 NOTE — Telephone Encounter (Signed)
rx filled per protocol  

## 2013-05-17 ENCOUNTER — Other Ambulatory Visit: Payer: Self-pay | Admitting: *Deleted

## 2013-05-17 MED ORDER — MORPHINE SULFATE (CONCENTRATE) 20 MG/ML PO SOLN: ORAL | Status: DC

## 2013-05-28 DIAGNOSIS — R279 Unspecified lack of coordination: Secondary | ICD-10-CM | POA: Diagnosis not present

## 2013-05-28 DIAGNOSIS — M6281 Muscle weakness (generalized): Secondary | ICD-10-CM | POA: Diagnosis not present

## 2013-05-28 DIAGNOSIS — R269 Unspecified abnormalities of gait and mobility: Secondary | ICD-10-CM | POA: Diagnosis not present

## 2013-05-28 DIAGNOSIS — F068 Other specified mental disorders due to known physiological condition: Secondary | ICD-10-CM | POA: Diagnosis not present

## 2013-05-28 IMAGING — CR DG ELBOW COMPLETE 3+V*R*
4 series · 4 of 4 positions shown · non-contrast
Comparison: None.

CLINICAL DATA: Status post fall; skin tear to right elbow.  Right
elbow pain.

RIGHT ELBOW - COMPLETE 3+ VIEW

[view not recorded (1 of 4)]
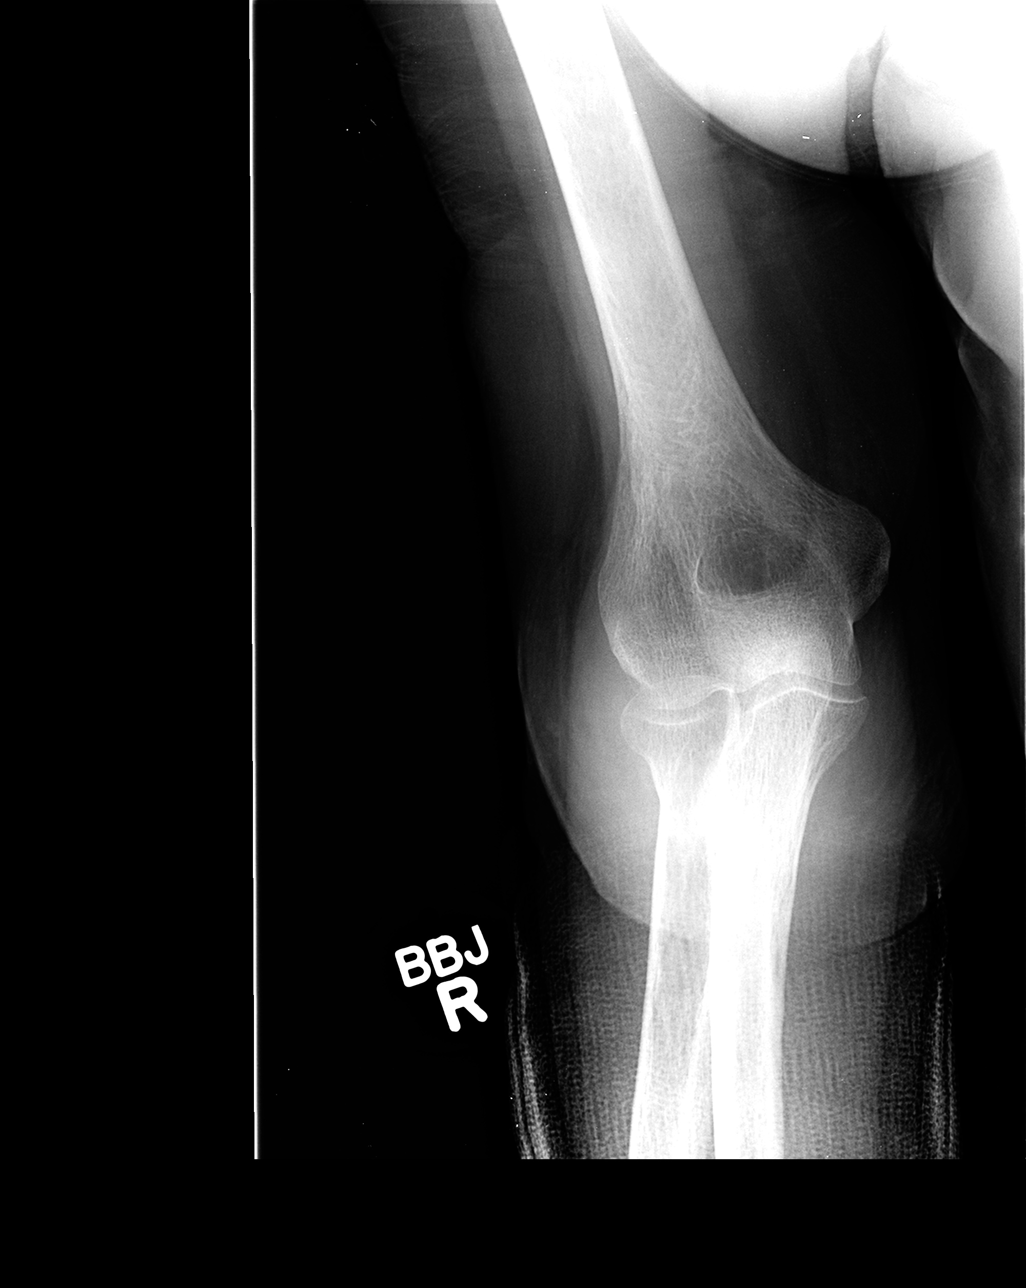

[view not recorded (2 of 4)]
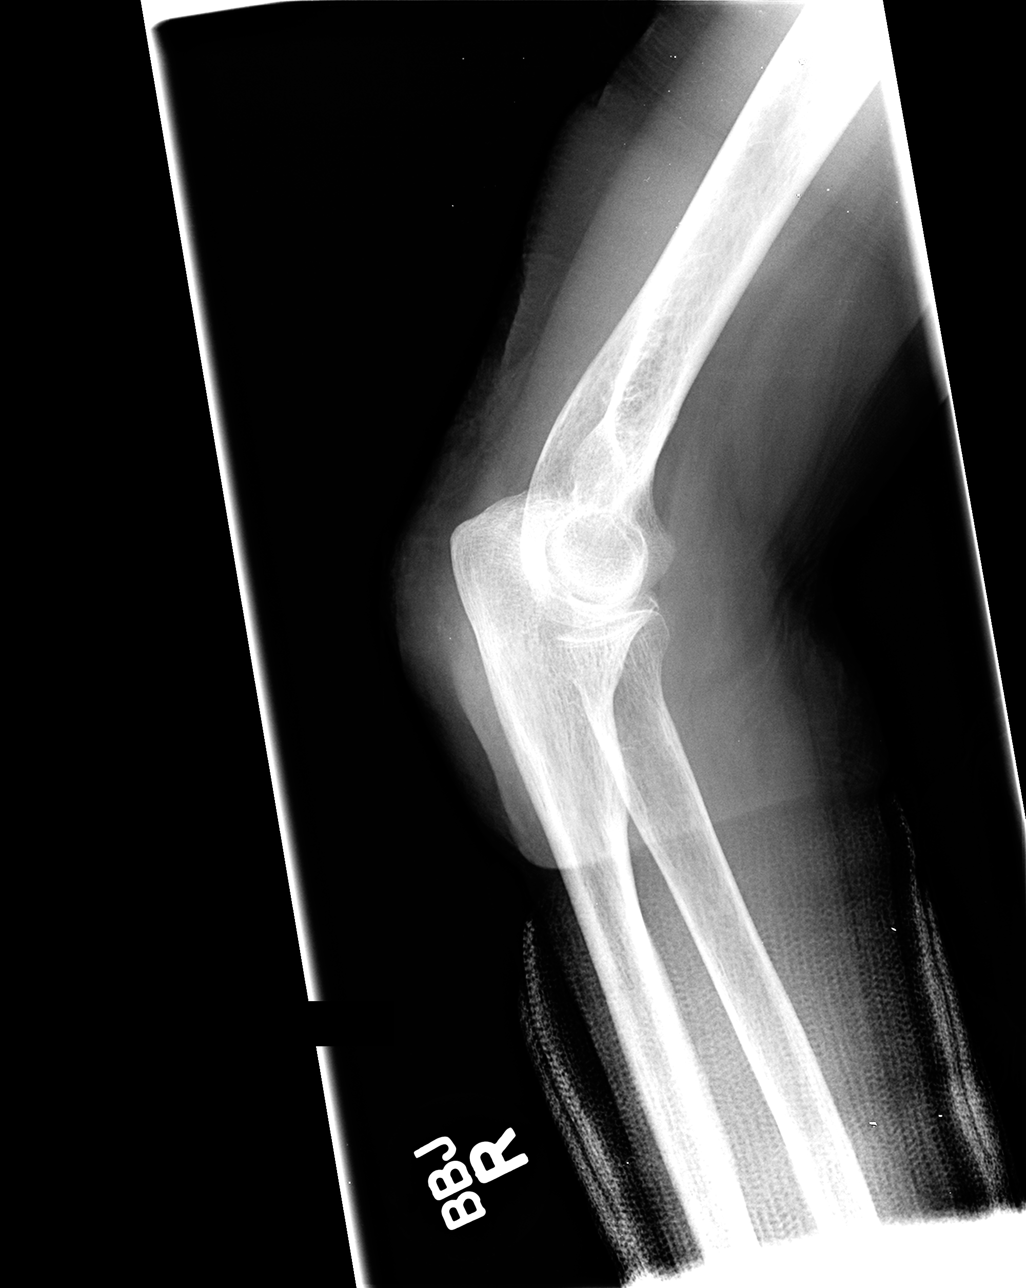

[view not recorded (3 of 4)]
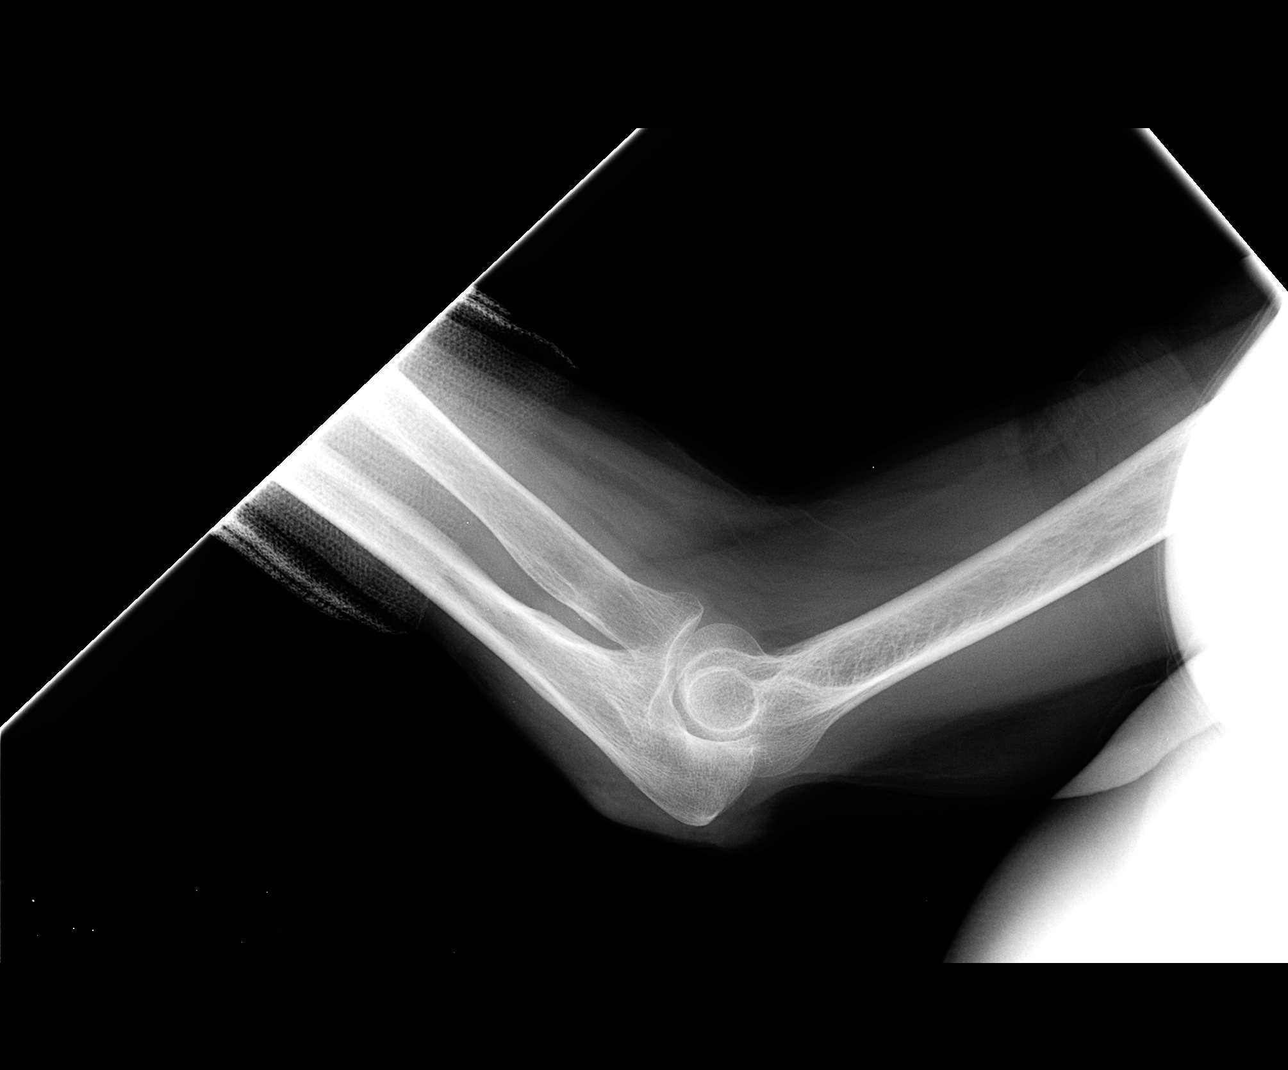

[view not recorded (4 of 4)]
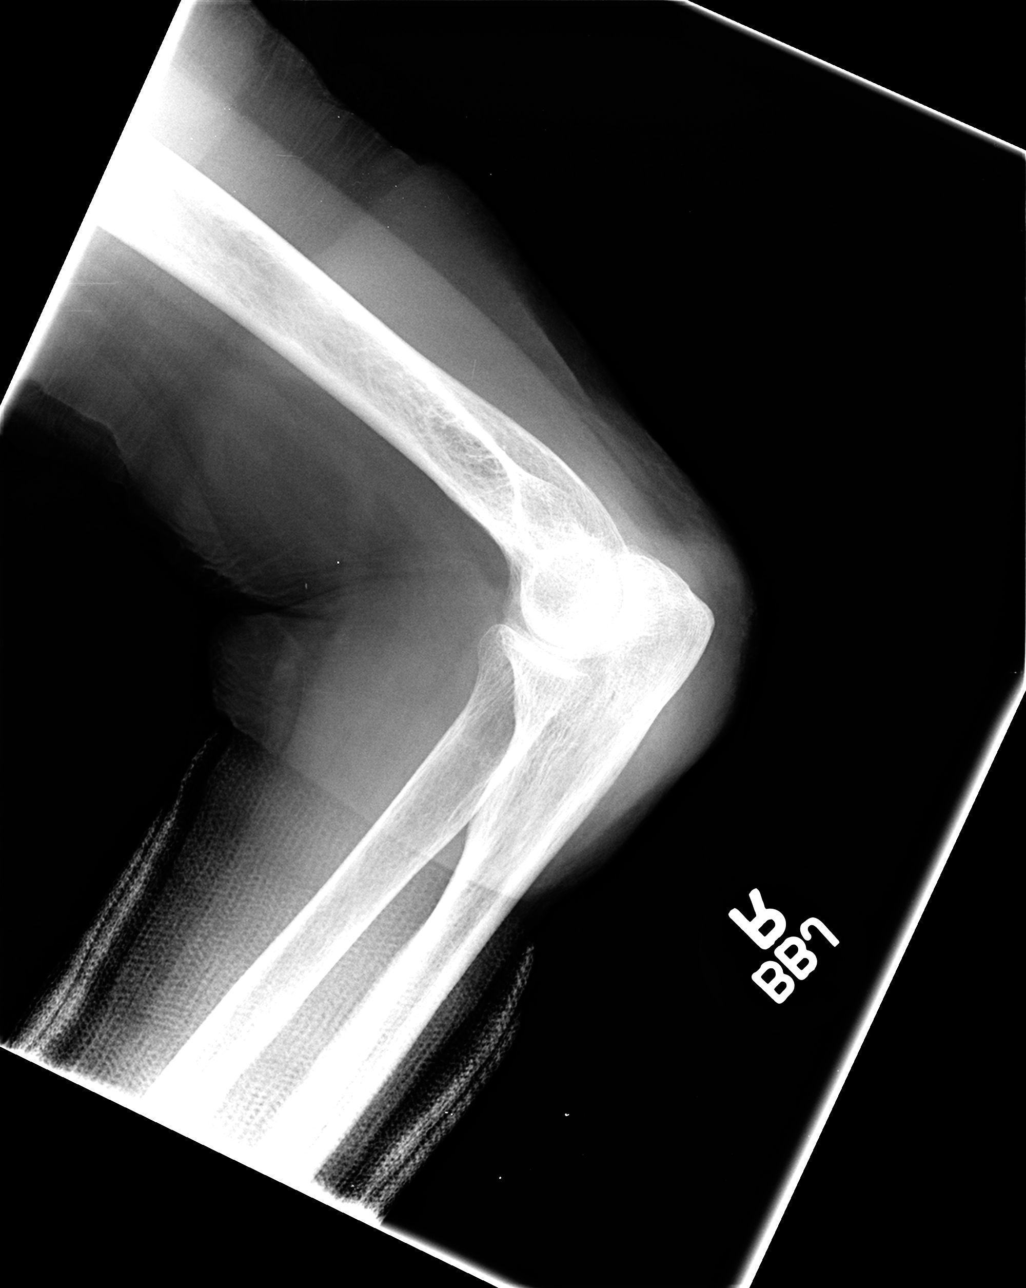

[4 of 4 positions shown; findings below may reference images not displayed]

FINDINGS: There is no evidence of fracture or dislocation.  The
visualized joint spaces are preserved.  No significant joint
effusion is identified.

Soft tissue swelling is noted dorsal to the elbow.
IMPRESSION: 1.  No evidence of fracture or dislocation.
2.  Soft tissue swelling noted along the dorsum of the elbow.

## 2013-05-29 DIAGNOSIS — M6281 Muscle weakness (generalized): Secondary | ICD-10-CM | POA: Diagnosis not present

## 2013-05-29 DIAGNOSIS — R279 Unspecified lack of coordination: Secondary | ICD-10-CM | POA: Diagnosis not present

## 2013-05-29 DIAGNOSIS — R269 Unspecified abnormalities of gait and mobility: Secondary | ICD-10-CM | POA: Diagnosis not present

## 2013-05-29 DIAGNOSIS — F068 Other specified mental disorders due to known physiological condition: Secondary | ICD-10-CM | POA: Diagnosis not present

## 2013-05-30 DIAGNOSIS — F068 Other specified mental disorders due to known physiological condition: Secondary | ICD-10-CM | POA: Diagnosis not present

## 2013-05-30 DIAGNOSIS — R279 Unspecified lack of coordination: Secondary | ICD-10-CM | POA: Diagnosis not present

## 2013-05-30 DIAGNOSIS — R269 Unspecified abnormalities of gait and mobility: Secondary | ICD-10-CM | POA: Diagnosis not present

## 2013-05-30 DIAGNOSIS — M6281 Muscle weakness (generalized): Secondary | ICD-10-CM | POA: Diagnosis not present

## 2013-05-31 DIAGNOSIS — F068 Other specified mental disorders due to known physiological condition: Secondary | ICD-10-CM | POA: Diagnosis not present

## 2013-05-31 DIAGNOSIS — R279 Unspecified lack of coordination: Secondary | ICD-10-CM | POA: Diagnosis not present

## 2013-05-31 DIAGNOSIS — R269 Unspecified abnormalities of gait and mobility: Secondary | ICD-10-CM | POA: Diagnosis not present

## 2013-05-31 DIAGNOSIS — M6281 Muscle weakness (generalized): Secondary | ICD-10-CM | POA: Diagnosis not present

## 2013-06-03 DIAGNOSIS — R269 Unspecified abnormalities of gait and mobility: Secondary | ICD-10-CM | POA: Diagnosis not present

## 2013-06-03 DIAGNOSIS — M6281 Muscle weakness (generalized): Secondary | ICD-10-CM | POA: Diagnosis not present

## 2013-06-03 DIAGNOSIS — R279 Unspecified lack of coordination: Secondary | ICD-10-CM | POA: Diagnosis not present

## 2013-06-03 DIAGNOSIS — F068 Other specified mental disorders due to known physiological condition: Secondary | ICD-10-CM | POA: Diagnosis not present

## 2013-06-04 DIAGNOSIS — R269 Unspecified abnormalities of gait and mobility: Secondary | ICD-10-CM | POA: Diagnosis not present

## 2013-06-04 DIAGNOSIS — R279 Unspecified lack of coordination: Secondary | ICD-10-CM | POA: Diagnosis not present

## 2013-06-04 DIAGNOSIS — M6281 Muscle weakness (generalized): Secondary | ICD-10-CM | POA: Diagnosis not present

## 2013-06-04 DIAGNOSIS — F068 Other specified mental disorders due to known physiological condition: Secondary | ICD-10-CM | POA: Diagnosis not present

## 2013-06-05 DIAGNOSIS — M6281 Muscle weakness (generalized): Secondary | ICD-10-CM | POA: Diagnosis not present

## 2013-06-05 DIAGNOSIS — R269 Unspecified abnormalities of gait and mobility: Secondary | ICD-10-CM | POA: Diagnosis not present

## 2013-06-05 DIAGNOSIS — F068 Other specified mental disorders due to known physiological condition: Secondary | ICD-10-CM | POA: Diagnosis not present

## 2013-06-05 DIAGNOSIS — R279 Unspecified lack of coordination: Secondary | ICD-10-CM | POA: Diagnosis not present

## 2013-06-06 DIAGNOSIS — R269 Unspecified abnormalities of gait and mobility: Secondary | ICD-10-CM | POA: Diagnosis not present

## 2013-06-06 DIAGNOSIS — R279 Unspecified lack of coordination: Secondary | ICD-10-CM | POA: Diagnosis not present

## 2013-06-06 DIAGNOSIS — F068 Other specified mental disorders due to known physiological condition: Secondary | ICD-10-CM | POA: Diagnosis not present

## 2013-06-06 DIAGNOSIS — M6281 Muscle weakness (generalized): Secondary | ICD-10-CM | POA: Diagnosis not present

## 2013-06-07 DIAGNOSIS — R269 Unspecified abnormalities of gait and mobility: Secondary | ICD-10-CM | POA: Diagnosis not present

## 2013-06-07 DIAGNOSIS — M6281 Muscle weakness (generalized): Secondary | ICD-10-CM | POA: Diagnosis not present

## 2013-06-07 DIAGNOSIS — R279 Unspecified lack of coordination: Secondary | ICD-10-CM | POA: Diagnosis not present

## 2013-06-07 DIAGNOSIS — F068 Other specified mental disorders due to known physiological condition: Secondary | ICD-10-CM | POA: Diagnosis not present

## 2013-06-10 DIAGNOSIS — R279 Unspecified lack of coordination: Secondary | ICD-10-CM | POA: Diagnosis not present

## 2013-06-10 DIAGNOSIS — F068 Other specified mental disorders due to known physiological condition: Secondary | ICD-10-CM | POA: Diagnosis not present

## 2013-06-10 DIAGNOSIS — R269 Unspecified abnormalities of gait and mobility: Secondary | ICD-10-CM | POA: Diagnosis not present

## 2013-06-10 DIAGNOSIS — M6281 Muscle weakness (generalized): Secondary | ICD-10-CM | POA: Diagnosis not present

## 2013-06-11 DIAGNOSIS — R279 Unspecified lack of coordination: Secondary | ICD-10-CM | POA: Diagnosis not present

## 2013-06-11 DIAGNOSIS — R269 Unspecified abnormalities of gait and mobility: Secondary | ICD-10-CM | POA: Diagnosis not present

## 2013-06-11 DIAGNOSIS — F068 Other specified mental disorders due to known physiological condition: Secondary | ICD-10-CM | POA: Diagnosis not present

## 2013-06-11 DIAGNOSIS — M6281 Muscle weakness (generalized): Secondary | ICD-10-CM | POA: Diagnosis not present

## 2013-06-12 DIAGNOSIS — R279 Unspecified lack of coordination: Secondary | ICD-10-CM | POA: Diagnosis not present

## 2013-06-12 DIAGNOSIS — M6281 Muscle weakness (generalized): Secondary | ICD-10-CM | POA: Diagnosis not present

## 2013-06-12 DIAGNOSIS — R269 Unspecified abnormalities of gait and mobility: Secondary | ICD-10-CM | POA: Diagnosis not present

## 2013-06-12 DIAGNOSIS — F068 Other specified mental disorders due to known physiological condition: Secondary | ICD-10-CM | POA: Diagnosis not present

## 2013-06-13 DIAGNOSIS — R279 Unspecified lack of coordination: Secondary | ICD-10-CM | POA: Diagnosis not present

## 2013-06-13 DIAGNOSIS — F068 Other specified mental disorders due to known physiological condition: Secondary | ICD-10-CM | POA: Diagnosis not present

## 2013-06-13 DIAGNOSIS — R269 Unspecified abnormalities of gait and mobility: Secondary | ICD-10-CM | POA: Diagnosis not present

## 2013-06-13 DIAGNOSIS — M6281 Muscle weakness (generalized): Secondary | ICD-10-CM | POA: Diagnosis not present

## 2013-06-14 DIAGNOSIS — M6281 Muscle weakness (generalized): Secondary | ICD-10-CM | POA: Diagnosis not present

## 2013-06-14 DIAGNOSIS — F068 Other specified mental disorders due to known physiological condition: Secondary | ICD-10-CM | POA: Diagnosis not present

## 2013-06-14 DIAGNOSIS — R269 Unspecified abnormalities of gait and mobility: Secondary | ICD-10-CM | POA: Diagnosis not present

## 2013-06-14 DIAGNOSIS — R279 Unspecified lack of coordination: Secondary | ICD-10-CM | POA: Diagnosis not present

## 2013-06-18 DIAGNOSIS — M6281 Muscle weakness (generalized): Secondary | ICD-10-CM | POA: Diagnosis not present

## 2013-06-18 DIAGNOSIS — R269 Unspecified abnormalities of gait and mobility: Secondary | ICD-10-CM | POA: Diagnosis not present

## 2013-06-18 DIAGNOSIS — R279 Unspecified lack of coordination: Secondary | ICD-10-CM | POA: Diagnosis not present

## 2013-06-18 DIAGNOSIS — F068 Other specified mental disorders due to known physiological condition: Secondary | ICD-10-CM | POA: Diagnosis not present

## 2013-06-19 DIAGNOSIS — M6281 Muscle weakness (generalized): Secondary | ICD-10-CM | POA: Diagnosis not present

## 2013-06-19 DIAGNOSIS — F068 Other specified mental disorders due to known physiological condition: Secondary | ICD-10-CM | POA: Diagnosis not present

## 2013-06-19 DIAGNOSIS — R269 Unspecified abnormalities of gait and mobility: Secondary | ICD-10-CM | POA: Diagnosis not present

## 2013-06-19 DIAGNOSIS — R279 Unspecified lack of coordination: Secondary | ICD-10-CM | POA: Diagnosis not present

## 2013-06-21 DIAGNOSIS — F068 Other specified mental disorders due to known physiological condition: Secondary | ICD-10-CM | POA: Diagnosis not present

## 2013-06-21 DIAGNOSIS — M6281 Muscle weakness (generalized): Secondary | ICD-10-CM | POA: Diagnosis not present

## 2013-06-21 DIAGNOSIS — R279 Unspecified lack of coordination: Secondary | ICD-10-CM | POA: Diagnosis not present

## 2013-06-21 DIAGNOSIS — R269 Unspecified abnormalities of gait and mobility: Secondary | ICD-10-CM | POA: Diagnosis not present

## 2013-06-24 DIAGNOSIS — F068 Other specified mental disorders due to known physiological condition: Secondary | ICD-10-CM | POA: Diagnosis not present

## 2013-06-24 DIAGNOSIS — R269 Unspecified abnormalities of gait and mobility: Secondary | ICD-10-CM | POA: Diagnosis not present

## 2013-06-24 DIAGNOSIS — R279 Unspecified lack of coordination: Secondary | ICD-10-CM | POA: Diagnosis not present

## 2013-06-24 DIAGNOSIS — M6281 Muscle weakness (generalized): Secondary | ICD-10-CM | POA: Diagnosis not present

## 2013-06-27 DIAGNOSIS — N390 Urinary tract infection, site not specified: Secondary | ICD-10-CM | POA: Diagnosis not present

## 2013-06-27 DIAGNOSIS — B999 Unspecified infectious disease: Secondary | ICD-10-CM | POA: Diagnosis not present

## 2013-07-11 ENCOUNTER — Non-Acute Institutional Stay (SKILLED_NURSING_FACILITY): Payer: Medicare Other | Admitting: Internal Medicine

## 2013-07-11 DIAGNOSIS — N179 Acute kidney failure, unspecified: Secondary | ICD-10-CM

## 2013-07-11 DIAGNOSIS — I10 Essential (primary) hypertension: Secondary | ICD-10-CM | POA: Diagnosis not present

## 2013-07-11 DIAGNOSIS — K5900 Constipation, unspecified: Secondary | ICD-10-CM | POA: Diagnosis not present

## 2013-07-11 DIAGNOSIS — F068 Other specified mental disorders due to known physiological condition: Secondary | ICD-10-CM | POA: Diagnosis not present

## 2013-07-11 DIAGNOSIS — C50911 Malignant neoplasm of unspecified site of right female breast: Secondary | ICD-10-CM

## 2013-07-11 DIAGNOSIS — R1319 Other dysphagia: Secondary | ICD-10-CM

## 2013-07-11 DIAGNOSIS — M810 Age-related osteoporosis without current pathological fracture: Secondary | ICD-10-CM

## 2013-07-11 DIAGNOSIS — R627 Adult failure to thrive: Secondary | ICD-10-CM

## 2013-07-11 DIAGNOSIS — C50919 Malignant neoplasm of unspecified site of unspecified female breast: Secondary | ICD-10-CM

## 2013-07-11 NOTE — Progress Notes (Signed)
Patient ID: MEKENZI MLECZKO, female   DOB: 1936/02/15, 78 y.o.   MRN: [id number]   This is a routine visit.  Level of care skilled.  Facility [facility].  Chief complaint. Management of chronic medical conditions including dementia-failure to thrive-hypertension-dysphagia with history of gastroparesis-history of breast cancer right-osteoporosis-.  History of present illness.  Patient is a pleasant 78 year old who has been a long-term resident at this facility.  She is actually doing considerably better than she was earlier this year in late last year.  Patient occasionally will have an acute change in status with increased weakness fever  leukocytosis and renal insufficiency--she sometimes will respond to an antibiotic and IVs-but most recent bout of this was concerning enough that her daughter essentially wanted mainly comfort measures with no hospitalization.  Despite rather conservative treatment she now appears to have rebounded once again-her medications were minimized she is now off her tamoxifen for a history of breast cancer again with consideration for largely comfort measures.  .  She also has a significant history of gastroparesis continues on Zofran and this has been stable.  Patient apparently has a spotty appetite yet although her weight appears to have been stable here the past month or 2.  She also has a history of a right arm fracture she she is on calcium supplementation-she does have a history of osteoporosis.  She has a history of hypertension recent blood pressures appears stable 109/76-140/70-in this range-she is on Norvasc.  Currently patient has no acute complaints although she is somewhat agitated with exam which is  somewhat limited  Family medical social history as been reviewed her history and physical on 03/20/2011 as well as numerous previous progress notes.  Medications have been reviewed per MAR.  Review of systems-very limited secondary to patient being  agitated somewhat this evening according to nursing staff she has a spotty appetite but her weight has been stable clinically appears to be significantly improved from earlier this year and does at times ambulate about the facility again-she is not complaining of any shortness of breath chest pain or abdominal discomfort.  Physical exam.  Temperature is 98.4 pulse 80 respirations 16 blood pressure 140/70-106/60-in this range weight is stable at 141.4 for the past month or 2.  In general this is a somewhat frail elderly female in no distress lying comfortably in bed.  Her skin is warm and dry.  Oropharynx somewhat difficult to assess since patient is not really opening her mouth much this evening.  Eyes sclera and conjunctiva are clear visual acuity appears grossly --I do not see any erythema although apparently she does get erythematous conjunctiva at times  Chest is clear to auscultation without any rhonchi rales or wheezes no labored breathing.  Heart is regular rate and rhythm without murmur gallop or rub-although this was limited secondary to patient agitated with exam she does not really have significant lower extremity edema  Abdomen is soft nontender with very active bowel sounds.  Musculoskeletal moves extremities x4 this appears to be baseline he did not note any deformities other than arthritic changes.  Neurologic appears to be grossly intact no lateralizing findings cranial nerves appear grossly intact speech is clear although quite confused.  Psych she is oriented to self only somewhat agitated with exam --although quite pleasant and smiling as long as  not attempting any invasive maneuver  Labs.  04/08/2014.  Sodium 140 potassium 3.7 BUN 31 creatinine 1.41.  Liver function tests within normal limits except calcium of 2.9.  CBC 9.8 hemoglobin 12.8 platelets 190.  Assessment and plan.  #1-failure to thrive-patient appears to have stabilized here-she is doing much  better than earlier this year-we'll continue to monitor her medications have been somewhat minimized.  #2-history of dementia with behavior as this is relatively stable get her medications have been minimized nonetheless she appears to have tolerated this fairly well.  #3 history of dysphasia with gastroparesis she continues on Zofran this has been stable for some time--she is also on a PPI.  #4-history of osteoporosis with history of  fractures she is on calcium supplementation.  #5-history of hypertension this appears to be stable she is on Norvasc.  #6 as history constipation she is on MiraLax appears to be stable as well  #7 history of renal insufficiency-her creatinine appears to be at baseline-her daughter has requested minimal lab draws and s will not order updated labs for now clinically she appears stable--patient has quite a bit of difficulty with any attempt at drawing labs is very uncomfortable for her and her daughter  at this time prefers comfort  #8-history of breast cancer-again medications have been minimized secondary to patient comfort and family wishes  [phone]

## 2013-07-13 ENCOUNTER — Encounter: Payer: Self-pay | Admitting: Internal Medicine

## 2013-07-17 ENCOUNTER — Ambulatory Visit (HOSPITAL_COMMUNITY): Payer: Medicare Other | Admitting: Oncology

## 2013-08-22 ENCOUNTER — Ambulatory Visit (HOSPITAL_COMMUNITY): Payer: Medicare Other

## 2013-09-05 ENCOUNTER — Non-Acute Institutional Stay (SKILLED_NURSING_FACILITY): Payer: Medicare Other | Admitting: Internal Medicine

## 2013-09-05 ENCOUNTER — Encounter: Payer: Self-pay | Admitting: Internal Medicine

## 2013-09-05 DIAGNOSIS — I10 Essential (primary) hypertension: Secondary | ICD-10-CM | POA: Diagnosis not present

## 2013-09-05 DIAGNOSIS — M810 Age-related osteoporosis without current pathological fracture: Secondary | ICD-10-CM

## 2013-09-05 DIAGNOSIS — C50911 Malignant neoplasm of unspecified site of right female breast: Secondary | ICD-10-CM

## 2013-09-05 DIAGNOSIS — B370 Candidal stomatitis: Secondary | ICD-10-CM

## 2013-09-05 DIAGNOSIS — C50919 Malignant neoplasm of unspecified site of unspecified female breast: Secondary | ICD-10-CM

## 2013-09-05 DIAGNOSIS — F068 Other specified mental disorders due to known physiological condition: Secondary | ICD-10-CM

## 2013-09-05 DIAGNOSIS — R627 Adult failure to thrive: Secondary | ICD-10-CM

## 2013-09-05 NOTE — Progress Notes (Signed)
Patient ID: [name], female   DOB: [birth year], 78 y.o.   MRN: [id number] This is a routine visit.  Level of care skilled.  Facility [facility] .  Chief complaint.  Management of chronic medical conditions including dementia-failure to thrive-hypertension-dysphagia with history of gastroparesis-history of breast cancer right-osteoporosis-.   History of present illness .  Patient is a pleasant 78 year old who has been a long-term resident at this facility.  She is actually doing considerably better than she was earlier this year and late last year.  Patient occasionally will have an acute change in status with increased weakness fever leukocytosis and renal insufficiency--she sometimes will respond to an antibiotic and IVs-but most recent bout of this was concerning enough that her daughter essentially wanted mainly comfort measures with no hospitalization.  Despite rather conservative treatment she now appears to have rebounded once again-her medications were minimized she is now off her tamoxifen for a history of breast cancer again with consideration for largely comfort measures.--  .  She also has a significant history of gastroparesis continues on Zofran and this has been stable.  Patient apparently has a spotty appetite yet although her weight appears to have been stable here the pastseveral months--currently  142.  She also has a history of a right arm fracture she she is on calcium supplementation-she does have a history of osteoporosis.  She has a history of hypertension recent blood pressures appears stable 114/51-112/58-137/79 in this range-she is on Norvasc.  Currently patient has no acute complaints she is cooperative with exam this evening.  Her daughter is concerned thinking that she may have a few white spots on her tongue apparently she's received nystatin in the past with relief   Family medical social history as been reviewed her history and physical on 03/20/2011 as well as  numerous previous progress notes.   Medications have been reviewed per MAR.   Review of systems-very limited secondary to patient with a history of significant dementia  according to nursing staff she has a spotty appetite but her weight has been stable clinically appears to be significantly improved from earlier this year and does at times ambulate about the facility again-she is not complaining of any shortness of breath chest pain or abdominal discomfort or throat discomfort.   Physical exam.   Temperature 98.1 pulse 90 respirations 20 blood pressure 114/51 O2 saturation 93% on room air her weight is stable at 142  In general this is a somewhat frail elderly female in no distress lying comfortably in bed.  Her skin is warm and dry.  Oropharynx -mucous membranes are moist-she appears to have minimal white spots on her tongue  Eyes sclera and conjunctiva are clear visual acuity appears grossly --I do not see any erythema although apparently she does get erythematous conjunctiva at times  Chest is clear to auscultation without any rhonchi rales or wheezes no labored breathing.  Heart is regular rate and rhythm without murmur gallop or rub- she does not really have significant lower extremity edema  Abdomen is soft nontender with very active bowel sounds.  Musculoskeletal moves extremities x4 this appears to be baseline he did not note any deformities other than arthritic changes.  Neurologic appears to be grossly intact no lateralizing findings cranial nerves appear grossly intact speech is clear although quite confused.  Psych she is oriented to self only --pleasant cooperative but confused   Labs  .  04/08/2014.  Sodium 140 potassium 3.7 BUN 31 creatinine 1.41.  Liver function  tests within normal limits except calcium of 2.9.  WBC 9.8 hemoglobin 12.8 platelets 190.   Assessment and plan.  #1-failure to thrive-patient appears to have stabilized here-she is doing much better than  earlier this year-we'll continue to monitor her medications have been somewhat minimized. ncer #2-history of dementia with behavior as this is relatively stable get her medications have been minimized nonetheless she appears to have tolerated this fairly well.  #3 history of dysphasia with gastroparesis she continues on Zofran this has been stable for some time--she is also on a PPI.  #4-history of osteoporosis with history of fractures she is on calcium supplementation.  #5-history of hypertension this appears to be stable she is on Norvasc.  #6 as history constipation she is on MiraLax appears to be stable as well  #7 history of renal insufficiency-her creatinine appears to be at baseline-her daughter has requested minimal lab draws and s will not order updated labs for now clinically she appears stable--patient has quite a bit of difficulty with any attempt at drawing labs is very uncomfortable for her and her daughter at this time prefers comfort  #8-history of breast cancer-again medications have been minimized secondary to patient comfort and family wishes #9 history of thrush-will order nystatin swab 4 times a day when necessary.     [id number]

## 2013-09-15 ENCOUNTER — Non-Acute Institutional Stay (SKILLED_NURSING_FACILITY): Payer: Medicare Other | Admitting: Internal Medicine

## 2013-09-15 DIAGNOSIS — M25529 Pain in unspecified elbow: Secondary | ICD-10-CM

## 2013-09-15 NOTE — Progress Notes (Signed)
Patient ID: [name], female   DOB: December 12, 1935, 78 y.o.   MRN: [id number]   This is an acute visit.  Level of care skilled.  Facility [facility].  Chief complaint-acute visit secondary to left arm pain.  History of present illness.  Patient apparently has been having some left arm pain per nursing note --apparently there's been no recent history of fall or trauma-  She does have a history of osteoporosis is on calcium supplementation-also receives Tylenol when necessary for pain.  Family medical social history as been reviewed per previous progress notes most recently 09/05/2013--as well as admission history and physical on 05/19/2010 Of note she does have a history of a right wrist fracture that was treated with a cast back in 2012 --but I do not see anything related to her left upper extremity .  [name]  Medications have been reviewed per MAR.  Review of systems quite limited secondary to significant dementia-however today's nurses state she is not complaining of any arm pain.  Patient herself does not complaining it appears of pain at that site.  Physical exam.  Temperature 97.2 pulse 72 respirations 18.  In general this is an elderly female in no distress resting comfortably in bed.  Her skin is warm and dry she does appear to have a small skin tear at the base of her right thumb area this is covered with a Band-Aid.  Chest is clear to auscultation with poor respiratory effort no labored breathing.  Heart is regular rate and rhythm without murmur gallop or rub.  Extremities-left arm and shoulder appear unremarkable I do not see any deformities there is no discomfort with flexion and extension at the elbow her grip strength is strong radial pulses intact I do not see any edema or erythema on the arm-likewise for right arm a similar benign presentation There is no tenderness to palpationof either arm.  She is able to raise her arms bilaterally although she is not really following  verbal commands very well    this evening   Assessment and plan.  #1-left arm pain-this appears to have resolved I do not see really anything abnormal on exam this evening she does not appear to be any pain she appears to be resting comfortably in bed at this point will continue to monitor she does have Tylenol as needed for pain but it doesn't look like this is even necessary at this point.--It does not appear she has a significant history of problems with her left arm per chart review  [phone]

## 2013-09-17 ENCOUNTER — Ambulatory Visit (HOSPITAL_COMMUNITY): Payer: Medicare Other

## 2013-09-17 ENCOUNTER — Ambulatory Visit (HOSPITAL_COMMUNITY): Payer: Medicare Other | Attending: Internal Medicine

## 2013-09-17 ENCOUNTER — Non-Acute Institutional Stay (SKILLED_NURSING_FACILITY): Payer: Medicare Other | Admitting: Internal Medicine

## 2013-09-17 DIAGNOSIS — M25539 Pain in unspecified wrist: Secondary | ICD-10-CM | POA: Diagnosis not present

## 2013-09-17 DIAGNOSIS — Z853 Personal history of malignant neoplasm of breast: Secondary | ICD-10-CM | POA: Insufficient documentation

## 2013-09-17 DIAGNOSIS — M25529 Pain in unspecified elbow: Secondary | ICD-10-CM

## 2013-09-17 DIAGNOSIS — M79609 Pain in unspecified limb: Secondary | ICD-10-CM | POA: Diagnosis not present

## 2013-09-17 NOTE — Progress Notes (Signed)
Patient ID: [name], female   DOB: Aug 13, 1935, 78 y.o.   MRN: [id number]   this is an acute visit.  Level of care skilled.  Facility [facility].  Chief complaint acute visit followup left arm pain.  History of present illness.  Patient is a elderly resident who I saw 2 days ago for left arm pain at that point she did not appear to be having much discomfort per exam however her daughter states that she appears to be complaining of pain again----and I am following up on this.  Patient is a poor historian secondary to dementia but apparently there've been no reported history of fall although she has done this in the past.  Physical exam.  Patient is able to flex and extend her left arm when pa--lpated does not appear to be acutely tender although it may be somewhat sore-- however today she does appear to have some discomfort when it is flexed and extended there is a small bruise superior to the elbow although this does not appear to be real significant.  Nonetheless she does appear to have some discomfort when she flexes and extends her arm at the elbow-otherwise exam is relatively baseline with previous one---   Assessment and plan.  At this point will order an x-ray of the area-she does have Tylenol if needed for pain Will await x-ray results.  [id number].

## 2013-09-25 DIAGNOSIS — F0390 Unspecified dementia without behavioral disturbance: Secondary | ICD-10-CM | POA: Diagnosis not present

## 2013-09-25 DIAGNOSIS — M79609 Pain in unspecified limb: Secondary | ICD-10-CM | POA: Diagnosis not present

## 2013-09-25 DIAGNOSIS — B351 Tinea unguium: Secondary | ICD-10-CM | POA: Diagnosis not present

## 2013-09-25 DIAGNOSIS — M19079 Primary osteoarthritis, unspecified ankle and foot: Secondary | ICD-10-CM | POA: Diagnosis not present

## 2013-09-27 ENCOUNTER — Ambulatory Visit (HOSPITAL_COMMUNITY): Payer: Medicare Other

## 2013-09-27 ENCOUNTER — Non-Acute Institutional Stay (SKILLED_NURSING_FACILITY): Payer: Medicare Other | Admitting: Internal Medicine

## 2013-09-27 ENCOUNTER — Ambulatory Visit (HOSPITAL_COMMUNITY)
Admit: 2013-09-27 | Discharge: 2013-09-27 | Disposition: A | Discharge status: Home / Self Care | Payer: Medicare Other | Source: Ambulatory Visit | Attending: Internal Medicine | Admitting: Internal Medicine

## 2013-09-27 DIAGNOSIS — Z0389 Encounter for observation for other suspected diseases and conditions ruled out: Secondary | ICD-10-CM | POA: Diagnosis not present

## 2013-09-27 DIAGNOSIS — S93119A Dislocation of interphalangeal joint of unspecified toe(s), initial encounter: Secondary | ICD-10-CM | POA: Diagnosis not present

## 2013-09-27 DIAGNOSIS — M79609 Pain in unspecified limb: Secondary | ICD-10-CM | POA: Diagnosis not present

## 2013-09-27 DIAGNOSIS — W19XXXA Unspecified fall, initial encounter: Secondary | ICD-10-CM

## 2013-09-27 DIAGNOSIS — S9030XA Contusion of unspecified foot, initial encounter: Secondary | ICD-10-CM | POA: Diagnosis not present

## 2013-09-27 DIAGNOSIS — S8010XA Contusion of unspecified lower leg, initial encounter: Secondary | ICD-10-CM | POA: Diagnosis not present

## 2013-09-27 DIAGNOSIS — S7000XA Contusion of unspecified hip, initial encounter: Secondary | ICD-10-CM | POA: Diagnosis not present

## 2013-09-27 DIAGNOSIS — Y92129 Unspecified place in nursing home as the place of occurrence of the external cause: Principal | ICD-10-CM

## 2013-09-27 DIAGNOSIS — S5010XA Contusion of unspecified forearm, initial encounter: Secondary | ICD-10-CM | POA: Diagnosis not present

## 2013-09-27 DIAGNOSIS — R52 Pain, unspecified: Secondary | ICD-10-CM

## 2013-09-27 NOTE — Progress Notes (Signed)
Patient ID: [name], female   DOB: 08/27/1935, 78 y.o.   MRN: [id number]   This is an acute visit.  Level of care skilled.  Facility [facility].  Chief complaint-acute visit secondary to fall.  History of present illness.  Patient is a 78 year old female who has been a long-term resident at this facility.  She has a history of dementia and is ambulatory-apparently late this afternoon she did fall this was a witnessed fall she did not hit her head but apparently did land on her right leg-she is complaining of right foot pain and staff has noted a small amount of swelling of her distal right foot she is complaining of pain diffusely of her right arm as well as right leg and foot.  Her vital signs remained stable.  Family medical social history has been reviewed per history and physical on 03/20/1999 well.  Medications have been reviewed per MAR.  Review of systems, limited secondary to dementia.  Again her main complaint is some diffuse pain right leg foot and arm that she is not complaining of any shortness of breath or chest pain she did not lose consciousness or hit her head per nursing staff.  Physical exam.  Captures 97.7 pulse 88 respirations 20 blood pressure 122/62.  In general this is a somewhat frail elderly female in no distress but she is quite anxious and confused.  Her skin is warm and dry she does have a small bruise right elbow area and also has what appears to be some swelling hematoma distal right foot slightly tender to palpation.  Eyes pupils are. We'll round react to light sclera and conjunctiva are clear visual acuity appears grossly intact.  Oropharynx clear mucous membranes moist.  Chest is clear to auscultation no labored breathing.  Heart is regular rate and rhythm without murmur gallop or rub she does not have significant lower extremity edema.  Abdomen is soft nontender with positive bowel sounds.  Muscle skeletal-general frailty but I did not note  any deformities somewhat difficult to tell extent of pain since patient is quite anxious she is able to move all 4 extremities it appears at baseline I did not note any deformity of the hip knee or elbow area on the right-she does have a small hematoma it appears developing distal right foot as noted previously.  [name]  Neurologic appears grossly intact no lateralizing findings.  Psych continues to have significant dementia only oriented to self and somewhat anxious  Labs. No recent labs secondary to family desires for largely nonaggressive measures minimal lab draws.  Assessment and plan.  #1-fall-assessment somewhat difficult secondary to patient's agitation with dementia -- I do not note any gross deformities other than what appears to be a hematoma on the distal right foot-will order x-rays of the involved areas including the arm hips and legs as well Doppler ultrasound of the right lower extremity secondary to the pain and swelling noted right foot-.  [id number] note greater than 25 minutes spent assessing patient initially after fall and later in bed after fall-again physical exam appeared to be stable although she is having diffuse pain complaints-and thus we'll order the fairly aggressive studies

## 2013-09-29 ENCOUNTER — Encounter: Payer: Self-pay | Admitting: Internal Medicine

## 2013-10-01 ENCOUNTER — Non-Acute Institutional Stay (SKILLED_NURSING_FACILITY): Payer: Medicare Other | Admitting: Internal Medicine

## 2013-10-01 ENCOUNTER — Encounter: Payer: Self-pay | Admitting: Internal Medicine

## 2013-10-01 ENCOUNTER — Ambulatory Visit (INDEPENDENT_AMBULATORY_CARE_PROVIDER_SITE_OTHER): Payer: Medicare Other | Admitting: Orthopedic Surgery

## 2013-10-01 VITALS — Ht 65.0 in | Wt 142.0 lb

## 2013-10-01 DIAGNOSIS — S92909A Unspecified fracture of unspecified foot, initial encounter for closed fracture: Secondary | ICD-10-CM

## 2013-10-01 DIAGNOSIS — S99919A Unspecified injury of unspecified ankle, initial encounter: Secondary | ICD-10-CM | POA: Diagnosis not present

## 2013-10-01 DIAGNOSIS — S92901A Unspecified fracture of right foot, initial encounter for closed fracture: Secondary | ICD-10-CM | POA: Insufficient documentation

## 2013-10-01 DIAGNOSIS — S99921A Unspecified injury of right foot, initial encounter: Secondary | ICD-10-CM | POA: Insufficient documentation

## 2013-10-01 DIAGNOSIS — S99929A Unspecified injury of unspecified foot, initial encounter: Secondary | ICD-10-CM

## 2013-10-01 DIAGNOSIS — S8990XA Unspecified injury of unspecified lower leg, initial encounter: Secondary | ICD-10-CM | POA: Diagnosis not present

## 2013-10-01 NOTE — Patient Instructions (Addendum)
Wear shoe x 4 weeks   No xrays needed   weight bearing as tolerated

## 2013-10-01 NOTE — Progress Notes (Signed)
Patient ID: [name], female   DOB: 1935/07/09, 78 y.o.   MRN: [id number]  Chief Complaint  Patient presents with  . Toe Injury    Right great toe fracture, DOI 09-27-13.    78 year old female with dementia who fell over another resident's foot injured her right foot had multiple x-rays of both feet and tibia and fibulas and there may be a possible sesamoid fracture of the right great toe. Prevent presents with swollen foot questionable x-rays, she has dementia. She is on Tylenol twice a day no mobilization.  She is here with her daughter who is giving the history  She has history of dental problems vision problems difficulties with skin healing frequent falls memory problems balance problems  Other review of systems normal  Past Medical History  Diagnosis Date  . Dementia   . Gastroparalysis   . HTN (hypertension)   . Gallstones   . Reflux   . Cancer   . Osteoporosis 11/18/2011  . Infiltrating ductal carcinoma of right female breast 12/02/2009    Qualifier: History of  By: [name] CMA [name]), June     Past Surgical History  Procedure Laterality Date  . Tonsillectomy and adenoidectomy    . Breast biopsy    . Breast lumpectomy     Ht 5\' 5"  (1.651 m)  Wt 142 lb (64.411 kg)  BMI 23.63 kg/m2 She has dementia Mood pleasant affect happy Gait W/C  Right foot  Skin closed , ecchymosis throughout the foot  Tenderness great toe hand aspect of this with the sesamoid. Ankle motion is normal. Muscle tone normal no atrophy. Joints are reduced and stable. Slight deformity of the small toe chronic.  No tenderness of the long bones of the upper or lower extremities bilaterally.  X-ray shows questionable sesamoid fracture  Impression foot injury  Recommend hard sole shoe for 4 weeks no followup needed  Weight-bear as tolerated.

## 2013-10-01 NOTE — Progress Notes (Signed)
Patient ID: [name], female   DOB: 01-01-1936, 78 y.o.   MRN: [id number]   This is an acute visit.  Level of care skilled.  Facility [facility].  Chief  Complaint  Acute visit followup right foot injury-with possible fracture.  History of present illness.  Patient is a 78 year old female who's been a long-term resident of here-I saw her late last week after she sustained an apparent injury to her right foot after apparently having contact with another residents foot--and tripping.  Multiple x-rays were done which did not show any acute process except for a possible new fracture of the tibial sesamoid of the first metatarsal.   she did see orthopedics yesterday with recommendation for hard sole shoe-.  Family medical social history reviewed.  Medications have been reviewed per MAR.  Review of systems Limited secondary to dementia-however she is not complaining of pain today appears much less anxious.  Physical exam.  Temperature 98.0 pulse 78 respirations 20 blood pressure 111/78  In general this is a pleasant elderly female in no distress resting comfortably in bed.  Her skin is warm and dry do note some ecchymosis around the base of the right great toe although does not really have much tenderness to palpation.  Also has an abrasion the right knee area this is covered with clear dressing I do not see any surrounding erythema or drainage  Heart distant heart sounds regular rate and rhythm.  Chest is clear to auscultation no labored breathing.  Muscle skeletal does have hard sole shoe in place right foot pedal pulse intact she has fairly minimal edema here some ecchymosis again around the base of the right great toe minimal tenderness.  She does not appear to have significant pain with range of motion of her extremities she appears much more comfortable than when I saw her last week.  Neurologic is grossly intact touch sensation appears to be intact on the right her speech  is clear although does have significant confusion with dementia which is baseline.  Psych she does have severe dementia much less anxious today than last week  .  Assessment and plan.  #1-possible new fracture tibial sesamoid first metatarsal-this has been addressed by orthopedics she does have a hard sole shoe although there is some concern that she will be compliant with this considering her level of dementia-nonetheless at this point appears stable does not appear to be in pain continue to monitor

## 2013-10-07 DIAGNOSIS — N390 Urinary tract infection, site not specified: Secondary | ICD-10-CM | POA: Diagnosis not present

## 2013-10-21 DIAGNOSIS — N390 Urinary tract infection, site not specified: Secondary | ICD-10-CM | POA: Diagnosis not present

## 2013-10-29 NOTE — Progress Notes (Signed)
April Brightly, MD [name] [id number]  Infiltrating ductal carcinoma of right female breast - Plan: CBC with Differential, Comprehensive metabolic panel, CBC with Differential, Comprehensive metabolic panel, CBC with Differential, Comprehensive metabolic panel, tamoxifen (NOLVADEX) 10 MG tablet  Osteoporosis - Plan: Comprehensive metabolic panel, Comprehensive metabolic panel, Comprehensive metabolic panel  Soreness of tongue - Plan: Vitamin B12, Folate, Vitamin D 25 hydroxy, Vitamin B12, Vitamin D 25 hydroxy, Folate  CURRENT THERAPY: Tamoxifen 10 mg BID beginning in July 2012, which she will take for 5 years. Additionally, she is on Prolia 60 mg SQ every 6 months for osteoporosis. (We are discontinuing Prolia today)  INTERVAL HISTORY: [name] 78 y.o. female returns for  regular  visit for followup of Right sided breast invasive ductal carcinoma, ER/PR + Her -2- neu negative. yPT3,ypNX yp Mx . Per notes from Dr [name];Right-sided breast cancer upper outer quadrant with progression on Arimidex, status post a limited segmental mastectomy by Dr. Zella Thompson in July 2012 at which time we switched her to tamoxifen 10 mg b.i.d. She will take Tamoxifen daily x 5 years finishing in July 2017.  I personally reviewed and went over laboratory results with the patient.  The results are noted within this dictation.  I personally reviewed and went over radiographic studies with the patient.  The results are noted within this dictation.   She has undergone multiple plain films and I encourage the reader to refer to [name] - [place] for details if interested.   The patient's daughter requests that we discontinue Prolia given the patient's clinical situation.  This is absolutely reasonable and therefore, we will not pursue this therapy any longer.  The supportive therapy plan is discontinued.    The daughter reports that Keyana got really sick a while back and a number of her medications  were discontinued as a result.  "We thought she was passing away."  Tamoxifen was one of the medications that was discontinued.  Now that she is better, we will re-institute the medication at 10 mg BID.  If she does not tolerate the medication, although she has been on it for 3 years, I would consider cutting the dose to 10 mg daily and/or discontinuing all together given the patient's severe dementia and other co morbidities.  However, Tamoxifen is typically well tolerated and if she tolerates it, we should continue the medication because dying from recurrent breast cancer is not typically a pleasant death.  Her daughter reports that Alvin complains of pain of her tongue occasionally.  I will check vitamin levels and make sure she is not deficient.  Otherwise, she denies any oncology complaints and ROS questioning is negative.   Past Medical History  Diagnosis Date  . Dementia   . Gastroparalysis   . HTN (hypertension)   . Gallstones   . Reflux   . Cancer   . Osteoporosis 11/18/2011  . Infiltrating ductal carcinoma of right female breast 12/02/2009    Qualifier: History of  By: [name] CMA [name]), June      has Infiltrating ductal carcinoma of right female breast; DEMENTIA; WEIGHT LOSS-ABNORMAL; Nausea with vomiting; DYSPHAGIA; Wrist fracture; Osteoporosis; HTN (hypertension); Dementia; Unspecified constipation; Edema; Abnormality of gait; Fall at nursing home; Fever, unspecified; Hypotension, unspecified; Sepsis; Hypernatremia; Acute renal failure; Leukocytosis, unspecified; Diarrhea; Vomiting; Pain in joint, upper arm; Right foot injury; and Foot fracture, right on her problem list.     is allergic to aricept and lithium.  Ms. Gracey does not currently  have medications on file.  Past Surgical History  Procedure Laterality Date  . Tonsillectomy and adenoidectomy    . Breast biopsy    . Breast lumpectomy      Denies any headaches, dizziness, double vision, fevers, chills, night  sweats, nausea, vomiting, diarrhea, constipation, chest pain, heart palpitations, shortness of breath, blood in stool, black tarry stool, urinary pain, urinary burning, urinary frequency, hematuria.   PHYSICAL EXAMINATION  ECOG PERFORMANCE STATUS: 2 - Symptomatic, <50% confined to bed  Filed Vitals:   10/31/13 1200  BP: 123/98  Pulse: 68  Resp: 20    GENERAL:alert, no distress, well nourished, well developed, comfortable, cooperative, smiling and pleasantly confused SKIN: skin color, texture, turgor are normal, no rashes or significant lesions HEAD: Normocephalic, No masses, lesions, tenderness or abnormalities EYES: normal, PERRLA, EOMI, Conjunctiva are pink and non-injected EARS: External ears normal OROPHARYNX:lips, buccal mucosa, and tongue normal and mucous membranes are moist  NECK: supple, trachea midline LYMPH:  not examined BREAST:not examined LUNGS: clear to auscultation  HEART: regular rate & rhythm, no murmurs and no gallops ABDOMEN:abdomen soft, non-tender and normal bowel sounds BACK: Back symmetric, no curvature. EXTREMITIES:less then 2 second capillary refill, no joint deformities, effusion, or inflammation, no skin discoloration  NEURO: alert & oriented x 3 with fluent speech, demented and pleasantly confused    LABORATORY DATA: CBC    Component Value Date/Time   WBC 11.2* 03/14/2013 1149   WBC 6.7 08/11/2010 1448   RBC 4.50 03/14/2013 1149   RBC 3.80 08/11/2010 1448   HGB 13.5 03/14/2013 1149   HGB 11.3* 08/11/2010 1448   HCT 41.8 03/14/2013 1149   HCT 34.0* 08/11/2010 1448   PLT 165 03/14/2013 1149   PLT 240 08/11/2010 1448   MCV 92.9 03/14/2013 1149   MCV 89.4 08/11/2010 1448   MCH 30.0 03/14/2013 1149   MCH 29.7 08/11/2010 1448   MCHC 32.3 03/14/2013 1149   MCHC 33.2 08/11/2010 1448   RDW 13.2 03/14/2013 1149   RDW 13.6 08/11/2010 1448   LYMPHSABS 1.3 03/14/2013 1149   LYMPHSABS 1.8 08/11/2010 1448   MONOABS 0.7 03/14/2013 1149   MONOABS 0.5 08/11/2010 1448   EOSABS 0.0  03/14/2013 1149   EOSABS 0.1 08/11/2010 1448   BASOSABS 0.0 03/14/2013 1149   BASOSABS 0.0 08/11/2010 1448      Chemistry      Component Value Date/Time   NA 141 03/14/2013 1149   K 3.9 03/14/2013 1149   CL 104 03/14/2013 1149   CO2 28 03/14/2013 1149   BUN 33* 03/14/2013 1149   CREATININE 1.73* 03/14/2013 1149      Component Value Date/Time   CALCIUM 9.0 03/14/2013 1149   CALCIUM 10.1 05/17/2007 2230   ALKPHOS 62 03/14/2013 1149   AST 28 03/14/2013 1149   ALT 36* 03/14/2013 1149   BILITOT 0.3 03/14/2013 1149        ASSESSMENT:  1. Right sided breast invasive ductal carcinoma, ER/PR + Her -2- neu negative. yPT3,ypNX yp Mx . Per notes form Dr [name];Right-sided breast cancer upper outer quadrant with progression on Arimidex, status post a limited segmental mastectomy by Dr. Zella Thompson in July 2012 at which time we switched her to tamoxifen 10 mg b.i.d. Which she will take for 5 years.  2. Osteoporosis, on Ca++ and Vit D.  Prolia discontinued. 3. Dementia 4. Tongue soreness  Patient Active Problem List   Diagnosis Date Noted  . Right foot injury 10/01/2013  . Foot fracture, right 10/01/2013  .  Pain in joint, upper arm 09/15/2013  . Vomiting 04/02/2013  . Diarrhea 03/23/2013  . Leukocytosis, unspecified 03/22/2013  . Hypotension, unspecified 03/19/2013  . Sepsis 03/19/2013  . Hypernatremia 03/19/2013  . Acute renal failure 03/19/2013  . Fever, unspecified 03/12/2013  . Abnormality of gait 02/03/2013  . Fall at nursing home 02/03/2013  . Edema 11/13/2012  . HTN (hypertension) 09/20/2012  . Dementia 09/20/2012  . Unspecified constipation 09/20/2012  . Osteoporosis 11/18/2011  . Wrist fracture 10/07/2010  . WEIGHT LOSS-ABNORMAL 12/03/2009  . Nausea with vomiting 12/03/2009  . DYSPHAGIA 12/03/2009  . Infiltrating ductal carcinoma of right female breast 12/02/2009  . DEMENTIA 12/02/2009     PLAN:  1. I personally reviewed and went over laboratory results with the patient.  The  results are noted within this dictation. 2. Labs today: CBC diff, CMET 3. Discontinue Prolia supportive therapy plan 4. Restart Tamoxifen 10 mg BID.  If not tolerated, would consider decreasing to 10 mg daily and if that is not tolerated, discontinuing permanently is not unreasonable. 5. Continue Ca+ and Vit D 6. Labs today: CBC diff, CMET, Folate, B12, Vit D 7. Chart reviewed 8. Return in 7 months.   THERAPY PLAN:  The patient became ill about 6 months ago and she was essentially transitioned to comfort measures.  Her Tamoxifen was appropriately discontinued.  However, she has recovered and is now back to her baseline.  Tamoxifen was not re-instituted by her nursing facility.  Now that she is recovered, reinitiating Tamoxifen is appropriate as it is typically well tolerated and decrease her risk of recurrence of disease as recurrent breast cancer is not a pleasant way to pass on.  With that being said, if she is not able to tolerate (which she has been since 2012 and would be unusual), I would consider discontinuing Tamoxifen as QOL is important for the patient and family.  We will discontinue Prolia per daughter request which is reasonable given the patient's clinical situation.    All questions were answered. The patient knows to call the clinic with any problems, questions or concerns. We can certainly see the patient much sooner if necessary.  Patient and plan discussed with Dr. Farrel Gobble and he is in agreement with the aforementioned.   KEFALAS,THOMAS 10/31/2013

## 2013-10-31 ENCOUNTER — Encounter (HOSPITAL_COMMUNITY): Payer: Medicare Other | Attending: Oncology | Admitting: Oncology

## 2013-10-31 ENCOUNTER — Encounter (HOSPITAL_COMMUNITY): Payer: Self-pay | Admitting: Oncology

## 2013-10-31 VITALS — BP 123/98 | HR 68 | Resp 20 | Wt 138.0 lb

## 2013-10-31 DIAGNOSIS — F0390 Unspecified dementia without behavioral disturbance: Secondary | ICD-10-CM | POA: Insufficient documentation

## 2013-10-31 DIAGNOSIS — C50911 Malignant neoplasm of unspecified site of right female breast: Secondary | ICD-10-CM

## 2013-10-31 DIAGNOSIS — Z170 Estrogen receptor positive status [ER+]: Secondary | ICD-10-CM

## 2013-10-31 DIAGNOSIS — C50419 Malignant neoplasm of upper-outer quadrant of unspecified female breast: Secondary | ICD-10-CM | POA: Diagnosis not present

## 2013-10-31 DIAGNOSIS — K1370 Unspecified lesions of oral mucosa: Secondary | ICD-10-CM | POA: Diagnosis not present

## 2013-10-31 DIAGNOSIS — K146 Glossodynia: Secondary | ICD-10-CM

## 2013-10-31 DIAGNOSIS — Z79810 Long term (current) use of selective estrogen receptor modulators (SERMs): Secondary | ICD-10-CM

## 2013-10-31 DIAGNOSIS — Z9010 Acquired absence of unspecified breast and nipple: Secondary | ICD-10-CM

## 2013-10-31 DIAGNOSIS — M810 Age-related osteoporosis without current pathological fracture: Secondary | ICD-10-CM

## 2013-10-31 LAB — CBC WITH DIFFERENTIAL/PLATELET
BASOS PCT: 0 % (ref 0–1)
Basophils Absolute: 0 10*3/uL (ref 0.0–0.1)
Eosinophils Absolute: 0.1 10*3/uL (ref 0.0–0.7)
Eosinophils Relative: 2 % (ref 0–5)
HEMATOCRIT: 39.1 % (ref 36.0–46.0)
HEMOGLOBIN: 12.5 g/dL (ref 12.0–15.0)
LYMPHS ABS: 1.5 10*3/uL (ref 0.7–4.0)
Lymphocytes Relative: 22 % (ref 12–46)
MCH: 28.2 pg (ref 26.0–34.0)
MCHC: 32 g/dL (ref 30.0–36.0)
MCV: 88.1 fL (ref 78.0–100.0)
MONOS PCT: 8 % (ref 3–12)
Monocytes Absolute: 0.5 10*3/uL (ref 0.1–1.0)
NEUTROS ABS: 4.5 10*3/uL (ref 1.7–7.7)
Neutrophils Relative %: 68 % (ref 43–77)
Platelets: 264 10*3/uL (ref 150–400)
RBC: 4.44 MIL/uL (ref 3.87–5.11)
RDW: 14.7 % (ref 11.5–15.5)
WBC: 6.6 10*3/uL (ref 4.0–10.5)

## 2013-10-31 LAB — COMPREHENSIVE METABOLIC PANEL
ALT: 20 U/L (ref 0–35)
AST: 20 U/L (ref 0–37)
Albumin: 3.8 g/dL (ref 3.5–5.2)
Alkaline Phosphatase: 94 U/L (ref 39–117)
BUN: 32 mg/dL — ABNORMAL HIGH (ref 6–23)
CO2: 26 mEq/L (ref 19–32)
Calcium: 9.8 mg/dL (ref 8.4–10.5)
Chloride: 103 mEq/L (ref 96–112)
Creatinine, Ser: 1.45 mg/dL — ABNORMAL HIGH (ref 0.50–1.10)
GFR calc Af Amer: 40 mL/min — ABNORMAL LOW (ref >90)
GFR calc non Af Amer: 34 mL/min — ABNORMAL LOW (ref >90)
Glucose, Bld: 111 mg/dL — ABNORMAL HIGH (ref 70–99)
Potassium: 3.9 mEq/L (ref 3.7–5.3)
Sodium: 143 mEq/L (ref 137–147)
Total Bilirubin: 0.3 mg/dL (ref 0.3–1.2)
Total Protein: 7.9 g/dL (ref 6.0–8.3)

## 2013-10-31 MED ORDER — TAMOXIFEN CITRATE 10 MG PO TABS: 10.0000 mg | ORAL_TABLET | Freq: Two times a day (BID) | ORAL | Status: DC

## 2013-10-31 NOTE — Progress Notes (Signed)
Labs drawn for cbcd, cmp, b12, d25 hydr. fol.

## 2013-10-31 NOTE — Patient Instructions (Signed)
[facility] Discharge Instructions  RECOMMENDATIONS MADE BY THE CONSULTANT AND ANY TEST RESULTS WILL BE SENT TO YOUR REFERRING PHYSICIAN.  EXAM FINDINGS BY THE PHYSICIAN TODAY AND SIGNS OR SYMPTOMS TO REPORT TO CLINIC OR PRIMARY PHYSICIAN: Exam and findings as discussed by Robynn Pane, PA-C.  Will check some labs and will restart the tamoxifen.  If there are any problems with the tamoxifen please let us know.  MEDICATIONS PRESCRIBED:  Tamoxifen - take as directed.  INSTRUCTIONS/FOLLOW-UP: Follow-up in January, 2016.  Thank you for choosing [facility] to provide your oncology and hematology care.  To afford each patient quality time with our providers, please arrive at least 15 minutes before your scheduled appointment time.  With your help, our goal is to use those 15 minutes to complete the necessary work-up to ensure our physicians have the information they need to help with your evaluation and healthcare recommendations.    Effective January 1st, 2014, we ask that you re-schedule your appointment with our physicians should you arrive 10 or more minutes late for your appointment.  We strive to give you quality time with our providers, and arriving late affects you and other patients whose appointments are after yours.    Again, thank you for choosing [facility].  Our hope is that these requests will decrease the amount of time that you wait before being seen by our physicians.       _____________________________________________________________  Should you have questions after your visit to [facility], please contact our office at [phone] between the hours of 8:30 a.m. and 4:30 p.m.  Voicemails left after 4:30 p.m. will not be returned until the following business day.  For prescription refill requests, have your pharmacy contact our office with your prescription refill request.     _______________________________________________________________  We hope that we have given you very good care.  You may receive a patient satisfaction survey in the mail, please complete it and return it as soon as possible.  We value your feedback!

## 2013-11-01 LAB — FOLATE: Folate: >20 ng/mL

## 2013-11-01 LAB — VITAMIN B12: Vitamin B-12: 802 pg/mL (ref 211–911)

## 2013-11-01 LAB — VITAMIN D 25 HYDROXY (VIT D DEFICIENCY, FRACTURES): Vit D, 25-Hydroxy: 40 ng/mL (ref 30–89)

## 2013-11-16 ENCOUNTER — Non-Acute Institutional Stay (SKILLED_NURSING_FACILITY): Payer: Medicare Other | Admitting: Internal Medicine

## 2013-11-16 ENCOUNTER — Encounter: Payer: Self-pay | Admitting: Internal Medicine

## 2013-11-16 DIAGNOSIS — K08409 Partial loss of teeth, unspecified cause, unspecified class: Secondary | ICD-10-CM | POA: Diagnosis not present

## 2013-11-16 DIAGNOSIS — K08109 Complete loss of teeth, unspecified cause, unspecified class: Secondary | ICD-10-CM

## 2013-11-16 NOTE — Progress Notes (Signed)
Patient ID: [name], female   DOB: 06-14-1935, 78 y.o.   MRN: [id number]   This is an acute visit  Level care skilled.  Facility [facility].  Chief complaint-acute visit followup visit fashion.  History of present illness.  Patient is an elderly resident who has been a long-term resident here she does have a history of significant dementia-the dentist was here earlier this morning for a tooth extraction upper mouth-.  She did have a Xanax order for 0.25 mg for anxiety prior to procedure.  Apparently the Xanax was not all that effective nonetheless he was able to extract the truth apparently without incident I am following up.  She does not appear to be complaining of any pain or discomfort she is at her baseline resting in bed-her vital signs are stable she is afebrile.  Family medical social history as been reviewed.  Medications have been reviewed per MAR.  Review of systems essentially unobtainable secondary to dementia please see history of present illness.  Physical exam.  Temperature is 98.6 pulse 69 respirations 20 blood pressure 129/71.  In general this is a somewhat frail elderly female in no distress resting comfortably in bed.  Her skin is warm and dry.  Oropharynx clear mucous membranes moist extraction site I do not see really any bleeding or significant tenderness to palpation around the area-patient is smiling although again this is a somewhat limited exam since patient does not hold her mouth open for long.  Her chest is clear to auscultation there is no labored breathing.  Heart is regular rate and rhythm with some irregular beats.  Assessment and plan.  #1-status post tooth extraction she appears to have tolerated this quite well -- although apparently it was somewhat of a challenge for the dentist secondary to her agitation-nonetheless she appears to be stable now-she is on Tylenol for pain-this appears to be effective other medication has caused confusion  in the past.  [id number]

## 2013-11-29 ENCOUNTER — Non-Acute Institutional Stay (SKILLED_NURSING_FACILITY): Payer: Medicare Other | Admitting: Internal Medicine

## 2013-11-29 DIAGNOSIS — L02818 Cutaneous abscess of other sites: Secondary | ICD-10-CM | POA: Diagnosis not present

## 2013-11-29 DIAGNOSIS — L03818 Cellulitis of other sites: Secondary | ICD-10-CM | POA: Diagnosis not present

## 2013-11-29 NOTE — Progress Notes (Signed)
Patient ID: BRIEANNA BIRSCHBACH, female   DOB: 18-Jan-1936, 78 y.o.   MRN: [id number]   This is an acute visit.  Care skilled.  Facility [facility]  Chief complaint-acute visit secondary to erythema skin tear left lower arm.  History of present illness.  Patient is an elderly resident who developed a skin tear on her left lower arm-subsequently did develop some increased erythema around the site- I was called about this  and she was started on an antibiotic doxycycline secondary to concerns of cellulitis --I am following up on this.  According to nursing staff she continues to be stable afebrile but there was concern about the erythema.  Physical exam.  She does have a skin tear mid left lower arm there is some surrounding erythema several centimeters inferior and superior to the area as well as lateral to it-there is no drainage bleeding or odor apparent.  The area does not appear to be acutely painful.  Assessment and plan.  #1-cellulitis-she has been started on doxycycline certainly will continue this as well as topical treatment with an antibiotic-at this point we'll continue to monitor-she does not appear to be in any distress certainly with this but will have to be watched carefully  [id number].

## 2013-12-20 DIAGNOSIS — I10 Essential (primary) hypertension: Secondary | ICD-10-CM | POA: Diagnosis not present

## 2013-12-20 DIAGNOSIS — F0390 Unspecified dementia without behavioral disturbance: Secondary | ICD-10-CM | POA: Diagnosis not present

## 2013-12-20 DIAGNOSIS — B351 Tinea unguium: Secondary | ICD-10-CM | POA: Diagnosis not present

## 2013-12-20 DIAGNOSIS — M79609 Pain in unspecified limb: Secondary | ICD-10-CM | POA: Diagnosis not present

## 2014-01-06 ENCOUNTER — Telehealth (HOSPITAL_COMMUNITY): Payer: Self-pay

## 2014-01-06 NOTE — Telephone Encounter (Signed)
Message copied by Mellissa Kohut on Mon Jan 06, 2014  4:05 PM ------      Message from: Farrel Gobble A      Created: Mon Jan 06, 2014 11:43 AM       Alternative strategy would be to decrease tamoxifen to 10 mg daily and if still does not tolerate, discontinue. ------

## 2014-01-06 NOTE — Telephone Encounter (Signed)
Daughter in and requesting that Tamoxifen be discontinued all together.  States "since the tamoxifen was restarted she's just not been feeling well.  She complains of nausea every morning, is not eating much at all and her face is flushed most of the time.  She gets her nausea medication routinely at the nursing home but she still has nausea and just doesn't feel well at all.  This has been going on since the Tamoxifen was resumed.

## 2014-01-06 NOTE — Telephone Encounter (Signed)
RN caring for [employer] notified.

## 2014-01-08 ENCOUNTER — Encounter: Payer: Self-pay | Admitting: Internal Medicine

## 2014-01-08 ENCOUNTER — Non-Acute Institutional Stay (SKILLED_NURSING_FACILITY): Payer: Medicare Other | Admitting: Internal Medicine

## 2014-01-08 DIAGNOSIS — R627 Adult failure to thrive: Secondary | ICD-10-CM | POA: Diagnosis not present

## 2014-01-08 DIAGNOSIS — R1319 Other dysphagia: Secondary | ICD-10-CM | POA: Diagnosis not present

## 2014-01-08 DIAGNOSIS — I10 Essential (primary) hypertension: Secondary | ICD-10-CM

## 2014-01-08 DIAGNOSIS — F068 Other specified mental disorders due to known physiological condition: Secondary | ICD-10-CM

## 2014-01-08 DIAGNOSIS — C50919 Malignant neoplasm of unspecified site of unspecified female breast: Secondary | ICD-10-CM

## 2014-01-08 DIAGNOSIS — C50911 Malignant neoplasm of unspecified site of right female breast: Secondary | ICD-10-CM

## 2014-01-08 NOTE — Progress Notes (Signed)
Patient ID: [name], female   DOB: October 13, 1935, 78 y.o.   MRN: [id number]     This is a routine visit.   Level of care skilled.   Facility [facility] .   Chief complaint.   Management of chronic medical conditions including dementia-failure to thrive-hypertension-dysphagia with history of gastroparesis-history of breast cancer right-osteoporosis-.    History of present illness  Patient is a 78 year old female has been a long-term resident at this facility she has significant dementia complicated with numerous medical issues including history of right breast cancer which is followed by oncology-she has periods where she does not do very well at all and then appears to rebound  Currently she appears to be in a stable state-her tamoxifen was discontinued a while back secondary to failure to thrive not doing well however again she is doing better in the tamoxifen has been restarted however the dose has been reduced .       [name]   She also has a significant history of gastroparesis continues on Zofran and this has been stable .    Patient continues with a spotty appetite appears she has lost weight about 6 pounds over the past month.  Her daughter is the dietitian in the facility and was wondering if she's probably having some increased swallowing difficulties which certainly could be the case with her level of dementia-speech therapy consult is pending.  She also has noted occasional coughing .   She also has a history of a right arm fracture she she is on calcium supplementation-she does have a history of osteoporosis--also has a recent small foot fracture which appears to have resolved fairly unremarkablly--she also has finished a course of antibiotics for what appeared to be some cellulitis to lower arm which appears resolved as well .   She has a history of hypertension recent blood pressures appears stable  at 119/86   inshe is on Norvasc.   Currently patient has no acute complaints she  is cooperative with exam this evening.--That her daughter is concerned about the weight loss and possible dysphagia and some coughing        Family medical social history as been reviewed her history and physical on 03/20/2011 as well as numerous previous progress notes.    Medications have been reviewed per MAR.    Review of systems-very limited secondary to patient with a history of significant dementia  according to nursing staff she has a spotty appetite but her weight appears to have declined clinicall-she is not complaining of any shortness of breath chest pain or abdominal discomfort or throat discomfort.    Physical exam.   Temperature 98.0 pulse 88 respirations 20 blood pressure 118/86 weight is 1:30   2   In general this is a somewhat frail elderly female in no distress l.   Her skin is warm and dry.   Oropharynx -mucous membranes are moist-   Eyes sclera and conjunctiva are clear visual acuity appears grossly --I do not see any erythema although apparently she does get erythematous conjunctiva at times   Chest is clear to auscultation without any rhonchi rales or wheezes no labored breathing. For his quite poor however .   Heart is regular rate and rhythm without murmur gallop or rub- she does not really have significant lower extremity edema   Abdomen is soft nontender with  active bowel sounds .   Musculoskeletal moves extremities x4 this appears to be baseline he did not note any deformities other  than arthritic changes.   Neurologic appears to be grossly intact no lateralizing findings cranial nerves appear grossly intact speech is clear although quite confused.   Psych she is oriented to self only --pleasant cooperative but confused    Labs   .   04/08/2014.   Sodium 140 potassium 3.7 BUN 31 creatinine 1.41.   Liver function tests within normal limits except calcium of 2.9.   WBC 9.8 hemoglobin 12.8 platelets 190.    Assessment and plan.   #1-failure to  thrive-this is a challenge certainly with her advancing dementia-I suspect she may be tat risk  for advancing dysphasia we will await speech therapy consult-also with her cough will order a chest x-ray her daughter is okay with this-she does not really desire real aggressive treatment but is okay with obtaining a chest x-ray as well as some labs-we will obtain a CBC with differential as well as a metabolic panel and TSH does have some baseline history of dysphagia   #2-history of dementia with behaviors--as noted this is significant but relatively baseline her medications have been minimized nonetheless she appears to have tolerated this fairly well.   #3 history of  gastroparesis she continues on Zofran this has been stable for some time--she is also on a PPI.   #4-history of osteoporosis with history of fractures she is on calcium supplementation.   #5-history of hypertension this appears to be stable she is on Norvasc.   #6 as history constipation she is on MiraLax appears to be stable as well   #7 history of renal insufficiency-most recent creatinine appears to be 1.41--but that was drawn sometime ago again her daughter does not wish real aggressive treatmentwithf her labs but is okay to get some updated labs   #8-history of breast cancer-again medications have been minimized secondary to patient comfort and family wishes she is on tamoxifen which was recently decreased to 10 mg a day  [phone]

## 2014-01-09 ENCOUNTER — Ambulatory Visit (HOSPITAL_COMMUNITY): Payer: Medicare Other | Attending: Internal Medicine

## 2014-01-09 DIAGNOSIS — R05 Cough: Secondary | ICD-10-CM | POA: Diagnosis not present

## 2014-01-09 DIAGNOSIS — R1311 Dysphagia, oral phase: Secondary | ICD-10-CM | POA: Diagnosis not present

## 2014-01-09 DIAGNOSIS — K3184 Gastroparesis: Secondary | ICD-10-CM | POA: Diagnosis not present

## 2014-01-09 DIAGNOSIS — R059 Cough, unspecified: Secondary | ICD-10-CM | POA: Diagnosis not present

## 2014-01-09 DIAGNOSIS — F0390 Unspecified dementia without behavioral disturbance: Secondary | ICD-10-CM | POA: Diagnosis not present

## 2014-01-09 DIAGNOSIS — R112 Nausea with vomiting, unspecified: Secondary | ICD-10-CM | POA: Diagnosis not present

## 2014-01-09 DIAGNOSIS — E039 Hypothyroidism, unspecified: Secondary | ICD-10-CM | POA: Diagnosis not present

## 2014-01-10 DIAGNOSIS — K3184 Gastroparesis: Secondary | ICD-10-CM | POA: Diagnosis not present

## 2014-01-10 DIAGNOSIS — F0390 Unspecified dementia without behavioral disturbance: Secondary | ICD-10-CM | POA: Diagnosis not present

## 2014-01-10 DIAGNOSIS — R112 Nausea with vomiting, unspecified: Secondary | ICD-10-CM | POA: Diagnosis not present

## 2014-01-10 DIAGNOSIS — N390 Urinary tract infection, site not specified: Secondary | ICD-10-CM | POA: Diagnosis not present

## 2014-01-10 DIAGNOSIS — R1311 Dysphagia, oral phase: Secondary | ICD-10-CM | POA: Diagnosis not present

## 2014-01-13 DIAGNOSIS — F0390 Unspecified dementia without behavioral disturbance: Secondary | ICD-10-CM | POA: Diagnosis not present

## 2014-01-13 DIAGNOSIS — K3184 Gastroparesis: Secondary | ICD-10-CM | POA: Diagnosis not present

## 2014-01-13 DIAGNOSIS — R1311 Dysphagia, oral phase: Secondary | ICD-10-CM | POA: Diagnosis not present

## 2014-01-13 DIAGNOSIS — R112 Nausea with vomiting, unspecified: Secondary | ICD-10-CM | POA: Diagnosis not present

## 2014-01-14 DIAGNOSIS — R1311 Dysphagia, oral phase: Secondary | ICD-10-CM | POA: Diagnosis not present

## 2014-01-14 DIAGNOSIS — K3184 Gastroparesis: Secondary | ICD-10-CM | POA: Diagnosis not present

## 2014-01-14 DIAGNOSIS — F0390 Unspecified dementia without behavioral disturbance: Secondary | ICD-10-CM | POA: Diagnosis not present

## 2014-01-14 DIAGNOSIS — R112 Nausea with vomiting, unspecified: Secondary | ICD-10-CM | POA: Diagnosis not present

## 2014-01-15 DIAGNOSIS — F0390 Unspecified dementia without behavioral disturbance: Secondary | ICD-10-CM | POA: Diagnosis not present

## 2014-01-15 DIAGNOSIS — R1311 Dysphagia, oral phase: Secondary | ICD-10-CM | POA: Diagnosis not present

## 2014-01-15 DIAGNOSIS — R112 Nausea with vomiting, unspecified: Secondary | ICD-10-CM | POA: Diagnosis not present

## 2014-01-15 DIAGNOSIS — K3184 Gastroparesis: Secondary | ICD-10-CM | POA: Diagnosis not present

## 2014-01-16 DIAGNOSIS — R1311 Dysphagia, oral phase: Secondary | ICD-10-CM | POA: Diagnosis not present

## 2014-01-16 DIAGNOSIS — K3184 Gastroparesis: Secondary | ICD-10-CM | POA: Diagnosis not present

## 2014-01-16 DIAGNOSIS — R112 Nausea with vomiting, unspecified: Secondary | ICD-10-CM | POA: Diagnosis not present

## 2014-01-16 DIAGNOSIS — F0390 Unspecified dementia without behavioral disturbance: Secondary | ICD-10-CM | POA: Diagnosis not present

## 2014-01-17 DIAGNOSIS — K3184 Gastroparesis: Secondary | ICD-10-CM | POA: Diagnosis not present

## 2014-01-17 DIAGNOSIS — R112 Nausea with vomiting, unspecified: Secondary | ICD-10-CM | POA: Diagnosis not present

## 2014-01-17 DIAGNOSIS — F0390 Unspecified dementia without behavioral disturbance: Secondary | ICD-10-CM | POA: Diagnosis not present

## 2014-01-17 DIAGNOSIS — R1311 Dysphagia, oral phase: Secondary | ICD-10-CM | POA: Diagnosis not present

## 2014-01-20 ENCOUNTER — Other Ambulatory Visit: Payer: Self-pay | Admitting: Gastroenterology

## 2014-01-20 ENCOUNTER — Encounter: Payer: Self-pay | Admitting: Gastroenterology

## 2014-01-20 ENCOUNTER — Ambulatory Visit (INDEPENDENT_AMBULATORY_CARE_PROVIDER_SITE_OTHER): Payer: Medicare Other | Admitting: Gastroenterology

## 2014-01-20 ENCOUNTER — Other Ambulatory Visit: Payer: Self-pay | Admitting: Internal Medicine

## 2014-01-20 VITALS — BP 122/84 | HR 80 | Temp 97.6°F | Ht 63.0 in | Wt 129.0 lb

## 2014-01-20 DIAGNOSIS — R112 Nausea with vomiting, unspecified: Secondary | ICD-10-CM | POA: Diagnosis not present

## 2014-01-20 DIAGNOSIS — G8929 Other chronic pain: Secondary | ICD-10-CM

## 2014-01-20 DIAGNOSIS — K3184 Gastroparesis: Secondary | ICD-10-CM

## 2014-01-20 DIAGNOSIS — R634 Abnormal weight loss: Secondary | ICD-10-CM

## 2014-01-20 DIAGNOSIS — R1013 Epigastric pain: Secondary | ICD-10-CM

## 2014-01-20 DIAGNOSIS — K5900 Constipation, unspecified: Secondary | ICD-10-CM

## 2014-01-20 DIAGNOSIS — K219 Gastro-esophageal reflux disease without esophagitis: Secondary | ICD-10-CM

## 2014-01-20 NOTE — Progress Notes (Signed)
cc'ed to pcp °

## 2014-01-20 NOTE — Patient Instructions (Signed)
1. Upper endoscopy with Dr. Rourk. See separate instructions.  

## 2014-01-20 NOTE — Progress Notes (Signed)
Primary Care Physician:  Cyndee Brightly, MD  Primary Gastroenterologist:  Garfield Cornea, MD   Chief Complaint  Patient presents with  . Nausea  . Emesis    HPI:  [name] is a 78 y.o. female here as a new patient referral for further evaluation nausea and vomiting, weight loss at the request of Dr. Dellia Nims, [facility]. She presents with her daughter [name], who is also the dietitian at the [facility] and [facility].   [name] provides history as patient has dementia. She has noticed several week trend of significant weight loss. She reports her mother weighed 142 pounds back in April. She is down to 129 pounds currently. She has lost 5% of her body weight over the past one month. Her the past several weeks she's noted that her mother tends to have more problems with oral intake. She thought initially that it may be related to progression of her dementia. She would seem to have food lay on the back of the tongue and then developed vomiting. She reportedly had an unremarkable bedside speech therapy evaluation. She's also had issues with her tongue being very sore and beefy. Folate, vitamin B12, vitamin D levels were negative. She has terrible reflux with significant belching. EGD in 2011 showed a hiatal hernia. She's on omeprazole 20 mg daily. History of gastroparesis with abnormal gastric emptying study previously. Was on Reglan for couple years but due to concerns for tardive dyskinesia, daughter asked for her to come off medication and she was doing well. Currently on around-the-clock Zofran for her gastroparesis. Chronically constipated but maintained on MiraLax with adequate results. No history of melena or rectal bleeding. Mother has complained of abdominal pain at various times, history difficult due to dementia. Had been maintaining her nutrition with boost and milk. Consumes very little pured, bites at a time.   Over the past 2 weeks she's had daily vomiting profuse amounts of  nonbloody emesis. Daughter's concern that she may not be getting her nighttime medications which is the most confused, difficult to get her to take them.    Current Outpatient Prescriptions on File Prior to Visit  Medication Sig Dispense Refill  . acetaminophen (TYLENOL) 325 MG tablet Take 650 mg by mouth 4 (four) times daily as needed.       [name] amLODipine (NORVASC) 5 MG tablet Take 5 mg by mouth daily.      [name] aspirin 81 MG chewable tablet Chew 81 mg by mouth daily.      . calcium-vitamin D (OSCAL WITH D) 500-200 MG-UNIT per tablet Take 1 tablet by mouth 2 (two) times daily.      . fluticasone (FLONASE) 50 MCG/ACT nasal spray Place 1 spray into both nostrils.      [name] loperamide (IMODIUM A-D) 2 MG tablet Take 2 mg by mouth 4 (four) times daily as needed for diarrhea or loose stools.      . Multiple Vitamin (MULTIVITAMIN WITH MINERALS) TABS tablet Take 1 tablet by mouth daily.      [name] nystatin (MYCOSTATIN) 100000 UNIT/ML suspension 5 mLs.      [name] omeprazole (PRILOSEC) 20 MG capsule Take 20 mg by mouth daily.        . ondansetron (ZOFRAN-ODT) 4 MG disintegrating tablet Take 4 mg by mouth 5 (five) times daily. 4 times daily and at bedtime.      . polyethylene glycol (MIRALAX / GLYCOLAX) packet Take 17 g by mouth daily.        [name]  promethazine (PHENERGAN) 12.5 MG suppository Place 12.5 mg rectally every 6 (six) hours as needed for nausea or vomiting.      . tamoxifen (NOLVADEX) 10 MG tablet Take 1 tablet (10 mg total) by mouth 2 (two) times daily.  60 tablet  6  . traMADol (ULTRAM) 50 MG tablet Take 50 mg by mouth as needed.       No current facility-administered medications on file prior to visit.       Allergies as of 01/20/2014 - Review Complete 01/20/2014  Allergen Reaction Noted  . Aricept [donepezil hydrochloride] Nausea And Vomiting 12/24/2010  . Lithium Nausea And Vomiting 12/24/2010    Past Medical History  Diagnosis Date  . Dementia   . Gastroparalysis   . HTN (hypertension)   .  Gallstones   . Reflux   . Cancer   . Osteoporosis 11/18/2011  . Infiltrating ductal carcinoma of right female breast 12/02/2009    Qualifier: History of  By: [name] CMA [name]), June      Past Surgical History  Procedure Laterality Date  . Tonsillectomy and adenoidectomy    . Breast biopsy    . Breast lumpectomy    . Esophagogastroduodenoscopy  12/11/2009    Dr. Fuller Plan: hiatal hernia  . Colonoscopy  2009    Dr. Fuller Plan: normal    Family History  Problem Relation Age of Onset  . Cancer    . Cancer Sister     breast  . Colon cancer Neg Hx     History   Social History  . Marital Status: Divorced    Spouse Name: N/A    Number of Children: 2  . Years of Education: college   Occupational History  . retired    Social History Main Topics  . Smoking status: Never Smoker   . Smokeless tobacco: Never Used  . Alcohol Use: No  . Drug Use: No  . Sexual Activity: Not on file   Other Topics Concern  . Not on file   Social History Narrative  . No narrative on file      ROS:  General: Negative for anorexia, weight loss, fever, chills, fatigue, weakness. Eyes: Negative for vision changes.  ENT: Negative for hoarseness, difficulty swallowing , nasal congestion. CV: Negative for chest pain, angina, palpitations, dyspnea on exertion, peripheral edema.  Respiratory: Negative for dyspnea at rest, dyspnea on exertion, cough, sputum, wheezing.  GI: See history of present illness. GU:  Negative for dysuria, hematuria, urinary incontinence, urinary frequency, nocturnal urination.  MS: Negative for joint pain, low back pain.  Derm: Negative for rash or itching.  Neuro: Negative for weakness, abnormal sensation, seizure, frequent headaches, memory loss, confusion.  Psych: Negative for anxiety, depression, suicidal ideation, hallucinations.  Endo: Negative for unusual weight change.  Heme: Negative for bruising or bleeding. Allergy: Negative for rash or hives.    Physical  Examination:  BP 122/84  Pulse 80  Temp(Src) 97.6 F (36.4 C) (Oral)  Ht 5\' 3"  (1.6 m)  Wt 129 lb (58.514 kg)  BMI 22.86 kg/m2   General: elderly WF in NAD. Pleasantly confused. Examined in chair after attempts to get her on exam table unsuccessful.  Head: Normocephalic, atraumatic.   Eyes: Conjunctiva pink, no icterus. Mouth: Oropharyngeal mucosa moist and pink , no lesions erythema or exudate. Neck: Supple without thyromegaly, masses, or lymphadenopathy.  Lungs: Clear to auscultation bilaterally.  Heart: Regular rate and rhythm, no murmurs rubs or gallops.  Abdomen: Bowel sounds are normal, mild epigastric tenderness,  nondistended, no hepatosplenomegaly or masses, no abdominal bruits or    hernia , no rebound or guarding.   Rectal: not performed Extremities: No lower extremity edema. No clubbing or deformities.  Neuro: Alert and oriented x 4 , grossly normal neurologically.  Skin: Warm and dry, no rash or jaundice.   Psych: Alert and cooperative, normal mood and affect.  Labs: Lab Results  Component Value Date   WBC 6.6 10/31/2013   HGB 12.5 10/31/2013   HCT 39.1 10/31/2013   MCV 88.1 10/31/2013   PLT 264 10/31/2013   Lab Results  Component Value Date   CREATININE 1.45* 10/31/2013   BUN 32* 10/31/2013   NA 143 10/31/2013   K 3.9 10/31/2013   CL 103 10/31/2013   CO2 26 10/31/2013   Lab Results  Component Value Date   ALT 20 10/31/2013   AST 20 10/31/2013   ALKPHOS 94 10/31/2013   BILITOT 0.3 10/31/2013     Imaging Studies: Dg Chest 1 View  01/09/2014   CLINICAL DATA:  Cough  EXAM: CHEST - 1 VIEW  COMPARISON:  Chest radiograph 03/20/2013  FINDINGS: Stable cardiac and mediastinal contours. No can scheduled no consolidative pulmonary opacities. No pleural effusion or pneumothorax. Regional skeleton is unremarkable. Interval removal left upper extremity PICC line.  IMPRESSION: No acute cardiopulmonary process.   Electronically Signed   By: Lovey Newcomer M.D.   On: 01/09/2014 14:21

## 2014-01-20 NOTE — Assessment & Plan Note (Signed)
78 year-old pleasantly demented lady who presents with her daughter who is a Microbiologist at the [facility] and [facility], [name] for further evaluation of abnormal weight loss, N/V. Patient reportedly has had decline in oral intake over the past several weeks. She has lost 13 pounds since April, the majority of it in the last few weeks. Initially there was concerns about oropharyngeal dysphagia related to progressive dementia. Reportedly unremarkable bedside evaluation by speech therapy. The past 2 weeks she's had profuse vomiting, nonbloody emesis postprandially. Given history of gastroparesis, cholelithiasis, GERD and these could be contributing factor. Discussed with patient's daughter, would offer upper endoscopy to exclude significant esophagitis, gastritis, peptic ulcer disease. This would help determine course of action. Can also try adding back Reglan with the caveat of known potential long-term side effects such as tardive dyskinesia. Patient previously did well but was taken off for fear of these specific side effects. Never had any sort of reaction however. Currently on around-the-clock Zofran. Patient's daughter would very much like an upper endoscopy for further evaluation of her symptoms.  I have discussed the risks, alternatives, benefits with regards to but not limited to the risk of reaction to medication, bleeding, infection, perforation and the patient is agreeable to proceed. Written consent to be obtained.  Retrieve copy of recent labs for review.

## 2014-01-29 ENCOUNTER — Encounter (HOSPITAL_COMMUNITY): Payer: Self-pay | Admitting: *Deleted

## 2014-01-29 ENCOUNTER — Encounter (HOSPITAL_COMMUNITY)
Admission: RE | Disposition: A | Discharge status: Skilled Nursing Facility | Payer: Self-pay | Source: Ambulatory Visit | Attending: Internal Medicine

## 2014-01-29 ENCOUNTER — Other Ambulatory Visit: Payer: Self-pay | Admitting: Internal Medicine

## 2014-01-29 ENCOUNTER — Ambulatory Visit (HOSPITAL_COMMUNITY)
Admission: RE | Admit: 2014-01-29 | Discharge: 2014-01-29 | Disposition: A | Discharge status: Skilled Nursing Facility | Payer: Medicare Other | Source: Ambulatory Visit | Attending: Internal Medicine | Admitting: Internal Medicine

## 2014-01-29 ENCOUNTER — Inpatient Hospital Stay
Admission: RE | Admit: 2014-01-29 | Discharge: 2016-06-10 | Disposition: A | Discharge status: Nursing Home (Includes ICF) | Payer: Medicaid Other | Source: Ambulatory Visit | Attending: Internal Medicine | Admitting: Internal Medicine

## 2014-01-29 DIAGNOSIS — K449 Diaphragmatic hernia without obstruction or gangrene: Secondary | ICD-10-CM | POA: Diagnosis not present

## 2014-01-29 DIAGNOSIS — R112 Nausea with vomiting, unspecified: Secondary | ICD-10-CM | POA: Insufficient documentation

## 2014-01-29 DIAGNOSIS — R634 Abnormal weight loss: Secondary | ICD-10-CM | POA: Diagnosis not present

## 2014-01-29 DIAGNOSIS — Z853 Personal history of malignant neoplasm of breast: Secondary | ICD-10-CM | POA: Diagnosis not present

## 2014-01-29 DIAGNOSIS — Z7982 Long term (current) use of aspirin: Secondary | ICD-10-CM | POA: Diagnosis not present

## 2014-01-29 DIAGNOSIS — K259 Gastric ulcer, unspecified as acute or chronic, without hemorrhage or perforation: Secondary | ICD-10-CM

## 2014-01-29 DIAGNOSIS — K2960 Other gastritis without bleeding: Secondary | ICD-10-CM

## 2014-01-29 DIAGNOSIS — K8020 Calculus of gallbladder without cholecystitis without obstruction: Secondary | ICD-10-CM

## 2014-01-29 DIAGNOSIS — K219 Gastro-esophageal reflux disease without esophagitis: Secondary | ICD-10-CM

## 2014-01-29 DIAGNOSIS — R52 Pain, unspecified: Principal | ICD-10-CM

## 2014-01-29 DIAGNOSIS — K2940 Chronic atrophic gastritis without bleeding: Secondary | ICD-10-CM | POA: Insufficient documentation

## 2014-01-29 DIAGNOSIS — Z79899 Other long term (current) drug therapy: Secondary | ICD-10-CM | POA: Diagnosis not present

## 2014-01-29 DIAGNOSIS — K5900 Constipation, unspecified: Secondary | ICD-10-CM

## 2014-01-29 DIAGNOSIS — G8929 Other chronic pain: Secondary | ICD-10-CM

## 2014-01-29 DIAGNOSIS — R1013 Epigastric pain: Secondary | ICD-10-CM

## 2014-01-29 DIAGNOSIS — I10 Essential (primary) hypertension: Secondary | ICD-10-CM | POA: Insufficient documentation

## 2014-01-29 HISTORY — PX: ESOPHAGOGASTRODUODENOSCOPY: SHX5428

## 2014-01-29 SURGERY — EGD (ESOPHAGOGASTRODUODENOSCOPY)
Anesthesia: Moderate Sedation

## 2014-01-29 MED ORDER — ONDANSETRON HCL 4 MG/2ML IJ SOLN
INTRAMUSCULAR | Status: DC
Start: 2014-01-29 — End: 2014-01-29
  Filled 2014-01-29: qty 2

## 2014-01-29 MED ORDER — ONDANSETRON HCL 4 MG/2ML IJ SOLN
INTRAMUSCULAR | Status: DC | PRN
  Administered 2014-01-29: 4 mg via INTRAVENOUS

## 2014-01-29 MED ORDER — MIDAZOLAM HCL 5 MG/5ML IJ SOLN
INTRAMUSCULAR | Status: DC
Start: 2014-01-29 — End: 2014-01-29
  Filled 2014-01-29: qty 10

## 2014-01-29 MED ORDER — MIDAZOLAM HCL 5 MG/5ML IJ SOLN
INTRAMUSCULAR | Status: DC | PRN
  Administered 2014-01-29: 2 mg via INTRAVENOUS

## 2014-01-29 MED ORDER — MEPERIDINE HCL 100 MG/ML IJ SOLN
INTRAMUSCULAR | Status: DC
Start: 2014-01-29 — End: 2014-01-29
  Filled 2014-01-29: qty 2

## 2014-01-29 MED ORDER — BUTAMBEN-TETRACAINE-BENZOCAINE 2-2-14 % EX AERO
INHALATION_SPRAY | CUTANEOUS | Status: DC | PRN
  Administered 2014-01-29: 2 via TOPICAL

## 2014-01-29 MED ORDER — SODIUM CHLORIDE 0.9 % IV SOLN
INTRAVENOUS | Status: DC
  Administered 2014-01-29: 14:00:00 via INTRAVENOUS

## 2014-01-29 MED ORDER — MEPERIDINE HCL 100 MG/ML IJ SOLN
INTRAMUSCULAR | Status: DC | PRN
  Administered 2014-01-29: 25 mg via INTRAVENOUS

## 2014-01-29 MED ORDER — STERILE WATER FOR IRRIGATION IR SOLN
Status: DC | PRN
  Administered 2014-01-29: 16:00:00

## 2014-01-29 NOTE — Discharge Instructions (Signed)
For now stop omeprazole; begin a trial Dexilant 60 mg daily  Schedule   Further recommendations to follow pending review of pathology report   EGD Discharge instructions Please read the instructions outlined below and refer to this sheet in the next few weeks. These discharge instructions provide you with general information on caring for yourself after you leave the hospital. Your doctor may also give you specific instructions. While your treatment has been planned according to the most current medical practices available, unavoidable complications occasionally occur. If you have any problems or questions after discharge, please call your doctor. ACTIVITY  You may resume your regular activity but move at a slower pace for the next 24 hours.   Take frequent rest periods for the next 24 hours.   Walking will help expel (get rid of) the air and reduce the bloated feeling in your abdomen.   No driving for 24 hours (because of the anesthesia (medicine) used during the test).   You may shower.   Do not sign any important legal documents or operate any machinery for 24 hours (because of the anesthesia used during the test).  NUTRITION  Drink plenty of fluids.   You may resume your normal diet.   Begin with a light meal and progress to your normal diet.   Avoid alcoholic beverages for 24 hours or as instructed by your caregiver.  MEDICATIONS  You may resume your normal medications unless your caregiver tells you otherwise.  WHAT YOU CAN EXPECT TODAY  You may experience abdominal discomfort such as a feeling of fullness or gas pains.  FOLLOW-UP  Your doctor will discuss the results of your test with you.  SEEK IMMEDIATE MEDICAL ATTENTION IF ANY OF THE FOLLOWING OCCUR:  Excessive nausea (feeling sick to your stomach) and/or vomiting.   Severe abdominal pain and distention (swelling).   Trouble swallowing.   Temperature over 101 F (37.8 C).   Rectal bleeding or vomiting  of blood.   For now stop omeprazole; begin a trial Dexilant 60 mg daily  Schedule Right Upper Quadrant Ultrasound to follow up on gallstones.   Further recommendations to follow pending review of pathology report

## 2014-01-29 NOTE — H&P (View-Only) (Signed)
Primary Care Physician:  Cyndee Brightly, MD  Primary Gastroenterologist:  Garfield Cornea, MD   Chief Complaint  Patient presents with  . Nausea  . Emesis    HPI:  [name] is a 78 y.o. female here as a new patient referral for further evaluation nausea and vomiting, weight loss at the request of Dr. Dellia Nims, [facility]. She presents with her daughter [name], who is also the dietitian at the [facility] and [facility].   [name] provides history as patient has dementia. She has noticed several week trend of significant weight loss. She reports her mother weighed 142 pounds back in April. She is down to 129 pounds currently. She has lost 5% of her body weight over the past one month. Her the past several weeks she's noted that her mother tends to have more problems with oral intake. She thought initially that it may be related to progression of her dementia. She would seem to have food lay on the back of the tongue and then developed vomiting. She reportedly had an unremarkable bedside speech therapy evaluation. She's also had issues with her tongue being very sore and beefy. Folate, vitamin B12, vitamin D levels were negative. She has terrible reflux with significant belching. EGD in 2011 showed a hiatal hernia. She's on omeprazole 20 mg daily. History of gastroparesis with abnormal gastric emptying study previously. Was on Reglan for couple years but due to concerns for tardive dyskinesia, daughter asked for her to come off medication and she was doing well. Currently on around-the-clock Zofran for her gastroparesis. Chronically constipated but maintained on MiraLax with adequate results. No history of melena or rectal bleeding. Mother has complained of abdominal pain at various times, history difficult due to dementia. Had been maintaining her nutrition with boost and milk. Consumes very little pured, bites at a time.   Over the past 2 weeks she's had daily vomiting profuse amounts of  nonbloody emesis. Daughter's concern that she may not be getting her nighttime medications which is the most confused, difficult to get her to take them.    Current Outpatient Prescriptions on File Prior to Visit  Medication Sig Dispense Refill  . acetaminophen (TYLENOL) 325 MG tablet Take 650 mg by mouth 4 (four) times daily as needed.       [name] amLODipine (NORVASC) 5 MG tablet Take 5 mg by mouth daily.      [name] aspirin 81 MG chewable tablet Chew 81 mg by mouth daily.      . calcium-vitamin D (OSCAL WITH D) 500-200 MG-UNIT per tablet Take 1 tablet by mouth 2 (two) times daily.      . fluticasone (FLONASE) 50 MCG/ACT nasal spray Place 1 spray into both nostrils.      [name] loperamide (IMODIUM A-D) 2 MG tablet Take 2 mg by mouth 4 (four) times daily as needed for diarrhea or loose stools.      . Multiple Vitamin (MULTIVITAMIN WITH MINERALS) TABS tablet Take 1 tablet by mouth daily.      [name] nystatin (MYCOSTATIN) 100000 UNIT/ML suspension 5 mLs.      [name] omeprazole (PRILOSEC) 20 MG capsule Take 20 mg by mouth daily.        . ondansetron (ZOFRAN-ODT) 4 MG disintegrating tablet Take 4 mg by mouth 5 (five) times daily. 4 times daily and at bedtime.      . polyethylene glycol (MIRALAX / GLYCOLAX) packet Take 17 g by mouth daily.        [name]  promethazine (PHENERGAN) 12.5 MG suppository Place 12.5 mg rectally every 6 (six) hours as needed for nausea or vomiting.      . tamoxifen (NOLVADEX) 10 MG tablet Take 1 tablet (10 mg total) by mouth 2 (two) times daily.  60 tablet  6  . traMADol (ULTRAM) 50 MG tablet Take 50 mg by mouth as needed.       No current facility-administered medications on file prior to visit.       Allergies as of 01/20/2014 - Review Complete 01/20/2014  Allergen Reaction Noted  . Aricept [donepezil hydrochloride] Nausea And Vomiting 12/24/2010  . Lithium Nausea And Vomiting 12/24/2010    Past Medical History  Diagnosis Date  . Dementia   . Gastroparalysis   . HTN (hypertension)   .  Gallstones   . Reflux   . Cancer   . Osteoporosis 11/18/2011  . Infiltrating ductal carcinoma of right female breast 12/02/2009    Qualifier: History of  By: [name] CMA [name]), June      Past Surgical History  Procedure Laterality Date  . Tonsillectomy and adenoidectomy    . Breast biopsy    . Breast lumpectomy    . Esophagogastroduodenoscopy  12/11/2009    Dr. Fuller Plan: hiatal hernia  . Colonoscopy  2009    Dr. Fuller Plan: normal    Family History  Problem Relation Age of Onset  . Cancer    . Cancer Sister     breast  . Colon cancer Neg Hx     History   Social History  . Marital Status: Divorced    Spouse Name: N/A    Number of Children: 2  . Years of Education: college   Occupational History  . retired    Social History Main Topics  . Smoking status: Never Smoker   . Smokeless tobacco: Never Used  . Alcohol Use: No  . Drug Use: No  . Sexual Activity: Not on file   Other Topics Concern  . Not on file   Social History Narrative  . No narrative on file      ROS:  General: Negative for anorexia, weight loss, fever, chills, fatigue, weakness. Eyes: Negative for vision changes.  ENT: Negative for hoarseness, difficulty swallowing , nasal congestion. CV: Negative for chest pain, angina, palpitations, dyspnea on exertion, peripheral edema.  Respiratory: Negative for dyspnea at rest, dyspnea on exertion, cough, sputum, wheezing.  GI: See history of present illness. GU:  Negative for dysuria, hematuria, urinary incontinence, urinary frequency, nocturnal urination.  MS: Negative for joint pain, low back pain.  Derm: Negative for rash or itching.  Neuro: Negative for weakness, abnormal sensation, seizure, frequent headaches, memory loss, confusion.  Psych: Negative for anxiety, depression, suicidal ideation, hallucinations.  Endo: Negative for unusual weight change.  Heme: Negative for bruising or bleeding. Allergy: Negative for rash or hives.    Physical  Examination:  BP 122/84  Pulse 80  Temp(Src) 97.6 F (36.4 C) (Oral)  Ht 5\' 3"  (1.6 m)  Wt 129 lb (58.514 kg)  BMI 22.86 kg/m2   General: elderly WF in NAD. Pleasantly confused. Examined in chair after attempts to get her on exam table unsuccessful.  Head: Normocephalic, atraumatic.   Eyes: Conjunctiva pink, no icterus. Mouth: Oropharyngeal mucosa moist and pink , no lesions erythema or exudate. Neck: Supple without thyromegaly, masses, or lymphadenopathy.  Lungs: Clear to auscultation bilaterally.  Heart: Regular rate and rhythm, no murmurs rubs or gallops.  Abdomen: Bowel sounds are normal, mild epigastric tenderness,  nondistended, no hepatosplenomegaly or masses, no abdominal bruits or    hernia , no rebound or guarding.   Rectal: not performed Extremities: No lower extremity edema. No clubbing or deformities.  Neuro: Alert and oriented x 4 , grossly normal neurologically.  Skin: Warm and dry, no rash or jaundice.   Psych: Alert and cooperative, normal mood and affect.  Labs: Lab Results  Component Value Date   WBC 6.6 10/31/2013   HGB 12.5 10/31/2013   HCT 39.1 10/31/2013   MCV 88.1 10/31/2013   PLT 264 10/31/2013   Lab Results  Component Value Date   CREATININE 1.45* 10/31/2013   BUN 32* 10/31/2013   NA 143 10/31/2013   K 3.9 10/31/2013   CL 103 10/31/2013   CO2 26 10/31/2013   Lab Results  Component Value Date   ALT 20 10/31/2013   AST 20 10/31/2013   ALKPHOS 94 10/31/2013   BILITOT 0.3 10/31/2013     Imaging Studies: Dg Chest 1 View  01/09/2014   CLINICAL DATA:  Cough  EXAM: CHEST - 1 VIEW  COMPARISON:  Chest radiograph 03/20/2013  FINDINGS: Stable cardiac and mediastinal contours. No can scheduled no consolidative pulmonary opacities. No pleural effusion or pneumothorax. Regional skeleton is unremarkable. Interval removal left upper extremity PICC line.  IMPRESSION: No acute cardiopulmonary process.   Electronically Signed   By: Lovey Newcomer M.D.   On: 01/09/2014 14:21

## 2014-01-29 NOTE — Interval H&P Note (Signed)
History and Physical Interval Note:  01/29/2014 3:44 PM  [name]  has presented today for surgery, with the diagnosis of WEIGHT LOSS, NAUSEA VOMITING, GERD, CONSTIPATION  The various methods of treatment have been discussed with the patient and family. After consideration of risks, benefits and other options for treatment, the patient has consented to  Procedure(s) with comments: ESOPHAGOGASTRODUODENOSCOPY (EGD) (N/A) - 245 as a surgical intervention .  The patient's history has been reviewed, patient examined, no change in status, stable for surgery.  I have reviewed the patient's chart and labs.  Questions were answered to the patient's satisfaction.   No change. EGD as planned.The risks, benefits, limitations, alternatives and imponderables have been reviewed with the patient. Potential for esophageal dilation, biopsy, etc. have also been reviewed.  Questions have been answered. All parties agreeable.  Manus Rudd

## 2014-01-29 NOTE — Op Note (Signed)
[facility] [address] [place], [address]   ENDOSCOPY PROCEDURE REPORT  PATIENT: April, Thompson  MR#: [id number] BIRTHDATE: October 18, 1935 , 49  yrs. old GENDER: female ENDOSCOPIST: R.  Garfield Cornea, MD FACP [facility] REFERRED BY:  Linton Ham, M.D. PROCEDURE DATE:  01/29/2014 PROCEDURE:  EGD w/ biopsy ASA CLASS:     Class III INDICATIONS:  nausea, vomiting, and weight loss. MEDICATIONS: Versed 2 mg IV and Demerol 25 mg IV and Zofran 4 mg IV . Cetacaine spray TOPICAL ANESTHETIC: Cetacaine Spray  DESCRIPTION OF PROCEDURE: After the risks benefits and alternatives of the procedure were thoroughly explained, informed consent was obtained.  The [id number] endoscope was introduced through the mouth and advanced to the second portion of the duodenum , limited by Without limitations. The instrument was slowly withdrawn as the mucosa was fully examined.      ESOPHAGUS:      Normal esophagus. Stomach empty. fine gastric erosions. No ulcer or infiltrating process. Small hiatal hernia. Patent pylorus. Duodenal bulbar erosions, otherwise, the first and second portion of the duodenum appeared normal. Except for the findings listed the EGD was otherwise normal.  Retroflexed views revealed no abnormalities.    Gastric biopsies taken for histologic study. The scope was then withdrawn from the patient and the procedure completed.  COMPLICATIONS: There were no complications.  ENDOSCOPIC IMPRESSION:  Gastric and duodenal erosions-status post gastric biopsy. Hiatal hernia. EGD was otherwise normal  RECOMMENDATIONS:  Stop the omeprazole; trial of Dexilant 60 mg daily. Proceed with a gallbladder ultrasound to further evaluate nausea. Followup on pathology.   REPEAT EXAM:  eSigned:  R. Garfield Cornea, MD FACP [facility] 01/29/2014 4:17 PM    CC:

## 2014-01-31 ENCOUNTER — Encounter (HOSPITAL_COMMUNITY): Payer: Self-pay | Admitting: Internal Medicine

## 2014-02-03 ENCOUNTER — Ambulatory Visit (HOSPITAL_COMMUNITY)
Admission: RE | Admit: 2014-02-03 | Discharge: 2014-02-03 | Disposition: A | Discharge status: Home / Self Care | Payer: Medicare Other | Source: Ambulatory Visit | Attending: Internal Medicine | Admitting: Internal Medicine

## 2014-02-03 ENCOUNTER — Encounter: Payer: Self-pay | Admitting: Internal Medicine

## 2014-02-03 DIAGNOSIS — K8020 Calculus of gallbladder without cholecystitis without obstruction: Secondary | ICD-10-CM | POA: Diagnosis not present

## 2014-02-11 ENCOUNTER — Ambulatory Visit (INDEPENDENT_AMBULATORY_CARE_PROVIDER_SITE_OTHER): Payer: Medicare Other | Admitting: Internal Medicine

## 2014-02-26 DIAGNOSIS — M79675 Pain in left toe(s): Secondary | ICD-10-CM | POA: Diagnosis not present

## 2014-02-26 DIAGNOSIS — M79674 Pain in right toe(s): Secondary | ICD-10-CM | POA: Diagnosis not present

## 2014-02-26 DIAGNOSIS — B351 Tinea unguium: Secondary | ICD-10-CM | POA: Diagnosis not present

## 2014-03-03 DIAGNOSIS — Z9181 History of falling: Secondary | ICD-10-CM | POA: Diagnosis not present

## 2014-03-03 DIAGNOSIS — F0391 Unspecified dementia with behavioral disturbance: Secondary | ICD-10-CM | POA: Diagnosis not present

## 2014-03-03 DIAGNOSIS — R279 Unspecified lack of coordination: Secondary | ICD-10-CM | POA: Diagnosis not present

## 2014-03-19 NOTE — Progress Notes (Signed)
Labs from 01/09/2014 Hemoglobin 15.1, hematocrit 46.1, BUN 49, creatinine 1.71, AST 22, ALT 21, albumin 3.7.

## 2014-05-02 ENCOUNTER — Encounter: Payer: Self-pay | Admitting: Internal Medicine

## 2014-05-02 ENCOUNTER — Non-Acute Institutional Stay (SKILLED_NURSING_FACILITY): Payer: Medicare Other | Admitting: Internal Medicine

## 2014-05-02 DIAGNOSIS — R300 Dysuria: Secondary | ICD-10-CM | POA: Diagnosis not present

## 2014-05-02 DIAGNOSIS — K3184 Gastroparesis: Secondary | ICD-10-CM | POA: Diagnosis not present

## 2014-05-02 DIAGNOSIS — F0391 Unspecified dementia with behavioral disturbance: Secondary | ICD-10-CM | POA: Diagnosis not present

## 2014-05-02 DIAGNOSIS — M810 Age-related osteoporosis without current pathological fracture: Secondary | ICD-10-CM

## 2014-05-02 DIAGNOSIS — K219 Gastro-esophageal reflux disease without esophagitis: Secondary | ICD-10-CM | POA: Diagnosis not present

## 2014-05-02 DIAGNOSIS — F03918 Unspecified dementia, unspecified severity, with other behavioral disturbance: Secondary | ICD-10-CM

## 2014-05-02 DIAGNOSIS — C50911 Malignant neoplasm of unspecified site of right female breast: Secondary | ICD-10-CM | POA: Diagnosis not present

## 2014-05-02 DIAGNOSIS — N390 Urinary tract infection, site not specified: Secondary | ICD-10-CM | POA: Diagnosis not present

## 2014-05-02 DIAGNOSIS — I10 Essential (primary) hypertension: Secondary | ICD-10-CM | POA: Diagnosis not present

## 2014-05-02 NOTE — Progress Notes (Signed)
Patient ID: [name], female   DOB: [birth year], 78 y.o.   MRN: [id number]    t           This is a routine visit.   Level of care skilled.   Facility [facility] .   Chief complaint.   Management of chronic medical conditions including dementia-failure to thrive-hypertension-dysphagia with history of gastroparesis-history of breast cancer right-osteoporosis----acute visit secondary to dysuria.    History of present illness  Patient is a 78 year old female has been a long-term resident at this facility she has significant dementia complicated with numerous medical issues including history of right breast cancer which is followed by oncology-she has periods where she does not do very well at all and then appears to rebound  Currently she appears to be in a stable state-her tamoxifen was discontinued a while back secondary to failure to thrive not doing well however again she is doing better and the tamoxifen has been restarted however the dose has been reduced .       [name]   She also has a significant history of gastroparesis continues on Zofranand Dexilant and this has been stable .    Patient continues with a spotty appetite but appears her weight has stabilized in the 130s.   .   She also has a history of a right arm fracture she she is on calcium supplementation-she does have a history of osteoporosis--also has a recent small foot fracture which appears to have resolved fairly unremarkablly- ll .   She has a history of hypertension recent blood pressures appears stable  at 126/54-I do see 147/56 but I don't see consistent elevations she is on Norvasc.    Patient today apparently has been somewhat more confused at baseline irritated-she appears to be having some discomfort when she urinates points to her suprapubic area        Family medical social history as been reviewed her history and physical on 03/20/2011 as well as numerous previous progress notes.    Medications  have been reviewed per MAR.    Review of systems-very limited secondary to patient with a history of significant dementia  according to nursing staff she remains at baseline-confused mostly pleasantly confused but can get quite agitated at times-also apparently has been complaining of some dysuria     Physical exam.   Temperature 97.4 pulse 78 respirations 20 blood pressure 126/54 weight appears to have stabilized at 132.8   2   In general this is a somewhat frail elderly female in no distress l.   Her skin is warm and dry.   Oropharynx -mucous membranes are moist-   Eyes sclera and conjunctiva are clear visual acuity appears grossly --I do not see any erythema although apparently she does get erythematous conjunctiva at times   Chest is clear to auscultation without any rhonchi rales or wheezes no labored breathing. Respiratory effort quite poor however .   Heart is regular rate and rhythm without murmur gallop or rub- she does not really have significant lower extremity edema   Abdomen is soft nontender with  active bowel sounds GU-she does appear to have some suprapubic tenderness I do not see any drainage or discharge from the vaginal area-I do not really note significant distention .   Musculoskeletal moves extremities x4 this appears to be baseline he did not note any deformities other than arthritic changes.   Neurologic appears to be grossly intact no lateralizing findings cranial nerves appear grossly intact speech  is clear although quite confused.   Psych she is oriented to self only --pleasant cooperative but confused    Labs  01/09/2014.  TSH-0.805.   .   01/09/2014.    WBC 12.5 hemoglobin 15.1 platelets 241.  Sodium 144 potassium 3.9 BUN 49 creatinine 1.7 to-liver function tests within normal limits albumin 3.7.    Assessment and plan.   #1-failure to thrive-her weight actually appears to have stabilized recently-she is on supplements-her daughter actually is  the dietitian in the facility    #2-history of dementia with behaviors--as noted this is significant but relatively baseline her medications have been minimized nonetheless she appears to have tolerated this fairly well.    #3 history of  gastroparesis she continues on Zofran this has been stable for some time--she is also on Dexilant .   #4-history of osteoporosis with history of fractures she is on calcium supplementation .   #5-history of hypertension this appears to be stable she is on Norvasc .   #6 as history constipation she is on MiraLax appears to be stable as well    #7 history of renal insufficiency-most recent creatinine appears to be 1.72--but that was drawn sometime ago again her daughter has requested minimal lab draws-if she agrees will obtain an updated metabolic panel as well as CBC   #8-history of breast cancer-again medications have been minimized secondary to patient comfort and family wishes she is on tamoxifen which was recently decreased to 10 mg a day  #9-dysuria-will obtain a urine culture and sensitivity continue to monitor for changes  [phone]

## 2014-05-03 DIAGNOSIS — N390 Urinary tract infection, site not specified: Secondary | ICD-10-CM | POA: Diagnosis not present

## 2014-05-05 ENCOUNTER — Other Ambulatory Visit (HOSPITAL_COMMUNITY)
Admission: RE | Admit: 2014-05-05 | Discharge: 2014-05-05 | Disposition: A | Discharge status: Home / Self Care | Payer: Medicare Other | Source: Ambulatory Visit | Attending: Internal Medicine | Admitting: Internal Medicine

## 2014-05-05 DIAGNOSIS — I10 Essential (primary) hypertension: Secondary | ICD-10-CM | POA: Diagnosis not present

## 2014-05-20 DIAGNOSIS — A047 Enterocolitis due to Clostridium difficile: Secondary | ICD-10-CM | POA: Diagnosis not present

## 2014-05-20 NOTE — Progress Notes (Signed)
-  No show-  [name],[name]  

## 2014-05-20 NOTE — Assessment & Plan Note (Signed)
She was treated with Prolia beginning in July 2013, but given recent changes in the patient, it was discontinued in July 2015 which is appropriate.

## 2014-05-20 NOTE — Assessment & Plan Note (Addendum)
Right sided breast invasive ductal carcinoma, ER/PR + Her -2- neu negative. yPT3,ypNX yp Mx . Per notes from Dr Neijstrom;Right-sided breast cancer upper outer quadrant with progression on Arimidex, status post a limited segmental mastectomy by Dr. Zella Richer in July 2012 at which time we switched her to tamoxifen 10 mg b.i.d. She will take Tamoxifen daily x 5 years finishing in July 2017.  Continue with Tamoxifen.  Labs today: CBC diff, CMET.  Return in 6 months for follow-up.

## 2014-05-23 ENCOUNTER — Ambulatory Visit (HOSPITAL_COMMUNITY): Payer: Medicare Other | Admitting: Oncology

## 2014-06-18 ENCOUNTER — Encounter: Payer: Self-pay | Admitting: Internal Medicine

## 2014-06-18 ENCOUNTER — Non-Acute Institutional Stay (SKILLED_NURSING_FACILITY): Payer: Medicare Other | Admitting: Internal Medicine

## 2014-06-18 DIAGNOSIS — R58 Hemorrhage, not elsewhere classified: Secondary | ICD-10-CM | POA: Diagnosis not present

## 2014-06-18 DIAGNOSIS — K3184 Gastroparesis: Secondary | ICD-10-CM | POA: Diagnosis not present

## 2014-06-18 DIAGNOSIS — I10 Essential (primary) hypertension: Secondary | ICD-10-CM

## 2014-06-18 DIAGNOSIS — F0391 Unspecified dementia with behavioral disturbance: Secondary | ICD-10-CM

## 2014-06-18 DIAGNOSIS — F03918 Unspecified dementia, unspecified severity, with other behavioral disturbance: Secondary | ICD-10-CM

## 2014-06-18 DIAGNOSIS — C50911 Malignant neoplasm of unspecified site of right female breast: Secondary | ICD-10-CM

## 2014-06-18 NOTE — Progress Notes (Signed)
Patient ID: [name], female   DOB: May 15, 1935, 79 y.o.   MRN: [id number]   This is a routine--acute  visit.   Level of care skilled.   Facility [facility] .   Chief complaint.   Management of chronic medical conditions including dementia-failure to thrive-hypertension-dysphagia with history of gastroparesis-history of breast cancer right-osteoporosis----acute visit secondary to ? Rectal bleeding    History of present illness  Patient is a 79 year old female has been a long-term resident at this facility she has significant dementia complicated with numerous medical issues including history of right breast cancer which is followed by oncology-she has periods where she does not do very well at all and then appears to rebound  Currently she appears to be in a stable state--she has occasional periods of agitation which is been somewhat challenging to nursing staff  -her tamoxifen was discontinued a while back secondary to failure to thrive not doing well however again she is doing better and the tamoxifen has been restarted however the dose has been reduced .       [name]   She also has a significant history of gastroparesis continues on Zofranand Dexilant and this has been stable .    Patient continues with a spotty appetite but appears her weight has stabilized in the 130s.   .   She also has a history of a right arm fracture she she is on calcium supplementation-she does have a history of osteoporosis--also has a recent small foot fracture which appears to have resolved fairly unremarkablly- ll .   She has a history of hypertension recent blood pressures appears stable  at 138/62-117/78 she is on Norvasc.     Her nursing tech today noticed possibly some dried blood in her diaper-I did do a rectal exam which appear to be negative for blood  Per occult testing-I also checked her vaginal area which did not have overt bleeding when I examined her-exam was somewhat limited secondary to patient  agitation        Family medical social history as been reviewed her history and physical on 03/20/2011 as well as numerous previous progress notes.    Medications have been reviewed per MAR.    Review of systems-very limited secondary to patient with a history of significant dementia  according to nursing staff she remains at baseline-confused mostly pleasantly confused but can get quite agitated at times-also some possible bleeding in the vaginal question rectal area-- Of note she has a small rash on her left lower leg that is being treated with a steroid cream however according nursing this is not really helping much     Physical exam.    T 97.1 pulse 77 respirations 20 blood pressure 138/62-117/78 weight is stable at 133.4   2   In general this is a somewhat frail elderly female in no distress l.   Her skin is warm and dry.--There is a small rash lower left leg-it is erythematous appear to have fairly well-defined borders that are raised one would question whether this is possibly fungal   Oropharynx -mucous membranes are moist-   Eyes sclera and conjunctiva are clear visual acuity appears grossly --I do not see any erythema although apparently she does get erythematous conjunctiva at times   Chest is clear to auscultation without any rhonchi rales or wheezes no labored breathing. Respiratory effort quite poor however .   Heart is regular rate and rhythm without murmur gallop or rub- she does not really have significant  lower extremity edema   Abdomen is soft nontender with  active bowel sounds Rectal-did not appreciate any external hemorrhoids-again guaiac testing for blood appeared to be negative GU- I did not see any overt bleeding per exam-I do not see any rash or discharge from the vaginal area .   Musculoskeletal moves extremities x4 this appears to be baseline he did not note any deformities other than arthritic changes.   Neurologic appears to be grossly intact no  lateralizing findings cranial nerves appear grossly intact speech is clear although quite confused--length appears to be intact.   Psych she is oriented to self only --pleasant cooperative but confused and quite agitated with attempts at invasive maneuvers    Labs  05/06/2014.  WBC 10.7 hemoglobin 11.3 platelets 236.  Sodium 139 potassium 4.1 BUN 40 creatinine 1.65.    01/09/2014.  TSH-0.805.   .   01/09/2014.    WBC 12.5 hemoglobin 15.1 platelets 241.  Sodium 144 potassium 3.9 BUN 49 creatinine 1.7 to-liver function tests within normal limits albumin 3.7.    Assessment and plan.   #1-failure to thrive-her weight actually appears to have stabilized recently-she is on supplements-her daughter actually is the dietitian in the facility    #2-history of dementia with behaviors--as noted this is significant but relatively baseline her medications have been minimized --.    #3 history of  gastroparesis she continues on Zofran this has been stable for some time--she is also on Dexilant .   #4-history of osteoporosis with history of fractures she is on calcium supplementation .   #5-history of hypertension this appears to be stable she is on Norvasc .   #6 as history constipation she is on MiraLax appears to be stable as well    #7 history of renal insufficiency-most recent creatinine appears to be baseline--1.65-- her daughter has requested minimal lab draws-if she agrees will obtain an updated metabolic panel    [id number] of breast cancer-again medications have been minimized secondary to patient comfort and family wishes she is on tamoxifen which was  decreased to 10 mg a day  #9--history of rectal? Vaginal bleeding-exam today was fairly unremarkable however I did note what appeared to be some dried blood in the diaper however source of this is still questionable-at this point will write an order to guaiac stools 3-monitor for any further bleeding whether vaginally or  rectally-also if family is okay will obtain lab work in the morning including a CBC a metabolic panel-I do note she is on aspirin-clinically again she appears to be stable and physical exam was not overtly concerning although this will have to be watched--  #10-leg rash-we'll discontinue the steroid cream and start Nizoral twice a day monitor for resolution  [id number]--of note greater than 35 minutes spent assessing patient-discussing her status with nursing staff-and coordinating and formulating a plan of care for numerous diagnoses-of note greater than 50% of time spent coordinating plan of care

## 2014-06-20 ENCOUNTER — Other Ambulatory Visit (HOSPITAL_COMMUNITY)
Admission: AD | Admit: 2014-06-20 | Discharge: 2014-06-20 | Disposition: A | Discharge status: Home / Self Care | Payer: Medicare Other | Source: Other Acute Inpatient Hospital | Attending: Internal Medicine | Admitting: Internal Medicine

## 2014-06-20 ENCOUNTER — Other Ambulatory Visit (HOSPITAL_COMMUNITY)
Admission: RE | Admit: 2014-06-20 | Discharge: 2014-06-20 | Disposition: A | Discharge status: Home / Self Care | Payer: Medicare Other | Source: Ambulatory Visit | Attending: Internal Medicine | Admitting: Internal Medicine

## 2014-06-20 ENCOUNTER — Inpatient Hospital Stay (HOSPITAL_COMMUNITY): Admit: 2014-06-20 | Payer: Self-pay

## 2014-06-20 LAB — CBC WITH DIFFERENTIAL/PLATELET
Basophils Absolute: 0 10*3/uL (ref 0.0–0.1)
Basophils Relative: 0 % (ref 0–1)
EOS ABS: 0.1 10*3/uL (ref 0.0–0.7)
EOS PCT: 2 % (ref 0–5)
HEMATOCRIT: 40.2 % (ref 36.0–46.0)
HEMOGLOBIN: 12.6 g/dL (ref 12.0–15.0)
LYMPHS ABS: 1.7 10*3/uL (ref 0.7–4.0)
Lymphocytes Relative: 31 % (ref 12–46)
MCH: 28.5 pg (ref 26.0–34.0)
MCHC: 31.3 g/dL (ref 30.0–36.0)
MCV: 91 fL (ref 78.0–100.0)
MONO ABS: 0.5 10*3/uL (ref 0.1–1.0)
MONOS PCT: 9 % (ref 3–12)
Neutro Abs: 3.2 10*3/uL (ref 1.7–7.7)
Neutrophils Relative %: 58 % (ref 43–77)
Platelets: 228 10*3/uL (ref 150–400)
RBC: 4.42 MIL/uL (ref 3.87–5.11)
RDW: 13.5 % (ref 11.5–15.5)
WBC: 5.5 10*3/uL (ref 4.0–10.5)

## 2014-06-20 LAB — BASIC METABOLIC PANEL
Anion gap: 6 (ref 5–15)
BUN: 37 mg/dL — AB (ref 6–23)
CHLORIDE: 107 mmol/L (ref 96–112)
CO2: 28 mmol/L (ref 19–32)
Calcium: 9.3 mg/dL (ref 8.4–10.5)
Creatinine, Ser: 1.59 mg/dL — ABNORMAL HIGH (ref 0.50–1.10)
GFR calc Af Amer: 35 mL/min — ABNORMAL LOW (ref >90)
GFR, EST NON AFRICAN AMERICAN: 30 mL/min — AB (ref >90)
GLUCOSE: 98 mg/dL (ref 70–99)
Potassium: 4.1 mmol/L (ref 3.5–5.1)
SODIUM: 141 mmol/L (ref 135–145)

## 2014-06-23 ENCOUNTER — Non-Acute Institutional Stay (SKILLED_NURSING_FACILITY): Payer: Medicare Other | Admitting: Internal Medicine

## 2014-06-23 DIAGNOSIS — F0391 Unspecified dementia with behavioral disturbance: Secondary | ICD-10-CM

## 2014-06-23 DIAGNOSIS — F03918 Unspecified dementia, unspecified severity, with other behavioral disturbance: Secondary | ICD-10-CM

## 2014-06-25 ENCOUNTER — Encounter: Payer: Self-pay | Admitting: Internal Medicine

## 2014-06-25 ENCOUNTER — Non-Acute Institutional Stay (SKILLED_NURSING_FACILITY): Payer: Medicare Other | Admitting: Internal Medicine

## 2014-06-25 DIAGNOSIS — F03918 Unspecified dementia, unspecified severity, with other behavioral disturbance: Secondary | ICD-10-CM

## 2014-06-25 DIAGNOSIS — F0391 Unspecified dementia with behavioral disturbance: Secondary | ICD-10-CM

## 2014-06-25 DIAGNOSIS — N289 Disorder of kidney and ureter, unspecified: Secondary | ICD-10-CM | POA: Diagnosis not present

## 2014-06-25 DIAGNOSIS — F319 Bipolar disorder, unspecified: Secondary | ICD-10-CM | POA: Insufficient documentation

## 2014-06-25 NOTE — Progress Notes (Addendum)
Patient ID: [name], female   DOB: December 09, 1935, 79 y.o.   MRN: [id number]               PROGRESS NOTE  DATE:  06/23/2014              FACILITY: [facility]       LEVEL OF CARE:   SNF   Acute Visit   CHIEF COMPLAINT:  Disinhibited behavior.     HISTORY OF PRESENT ILLNESS:  [name] is a lady with advanced dementia.  She does not carry a primary psychiatric diagnosis that I am aware of.    Apparently, she has become fixated with two female patients across the hallway.  She is repetitively trying to kiss them, get in their beds in their room.  Per the nursing staff, she has been on Depakote for other types of behavior in the past, although she has been off this for quite some time.    As far as her recent medical issues, she was worked up in the fall for vomiting and weight loss by Dr. [name].  This included an endoscopy that showed gastric and duodenal erosions.  She had a hiatal hernia.  The pathology showed chronic gastritis.  No metaplasia, dysplasia, or malignancy.  Negative for H.pylori.  She also had an abdominal ultrasound that showed multiple stones within the gallbladder lumen.    No pericholecystic fluid.  Her common bile duct was normal.  Visualized portion of the pancreas was normal.     PHYSICAL EXAMINATION:   GENERAL APPEARANCE:  The patient is not in any distress.    CHEST/RESPIRATORY:  Clear air entry bilaterally.    CARDIOVASCULAR:  CARDIAC:   Heart sounds are normal.  She appears to be euvolemic.   GASTROINTESTINAL:  ABDOMEN:   Slightly distended.   Bowel sounds are positive.  There are no masses.   LIVER/SPLEEN/KIDNEYS:  No liver, no spleen.   PSYCHIATRIC:   MENTAL STATUS:   The patient was calm and cooperative until I tried to start examining her.  She then became increasingly restless with nonsensical verbalizations.    ASSESSMENT/PLAN:                    Dementia with behavioral disturbances.  The patient is already at risk for falling.  Any  additional medications that I give her will probably increase that risk.  I have asked the facility to consider non-pharmacologic treatment such as moving her room.  I will have them discuss this with her daughter before I consider pharmacologic medications.  The pharmacologic alternatives would probably be an SSRI and/or valproic acid.   I will look for follow-up on the non-pharmacologic measures first.  Her background psychiatric history may need to be reinvented as I really don't see this currently. Sexually disinhibited behavior is never easy to resolve

## 2014-06-25 NOTE — Progress Notes (Signed)
Patient ID: [name], female   DOB: [birth year], 79 y.o.   MRN: [id number]   FACILITY: [facility]       LEVEL OF CARE:   SNF   Acute Visit   CHIEF COMPLAINT: Follow-up  Disinhibited behavior.     HISTORY OF PRESENT ILLNESS:  [name] is a lady with advanced dementia.[name]    Apparently, she has become fixated with two female patients across the hallway.  She is repetitively trying to kiss them, get in their beds in their room.  Per nursing and chart review, she has been on Depakote for other types of behavior in the past, although she has been off this for quite some time-  Per chart review I see she does have a diagnosis of bipolar disorder apparently this has been long-term and had been on Depakote apparently for an extended period previously on and off.  This was discontinued some time ago and patient had a failure to thrive episode with nausea and vomiting and her medications were minimized-  Nursing staff did discuss this with Dr. Dellia Nims who is in the facility today-Dr. Dellia Nims did see her yesterday and was hesitant to start any additional pharmacology secondary to fall risk-however with her diagnosis of bipolar disorder-Dr. Dellia Nims has recommended restarting the Depakote  Today patient is pleasantly confused at times wandering the halls-when I saw her she was in her room visiting with her daughter patient appears to be more calm when her daughter is present  Family medical social history has been reviewed .  Medications have been reviewed. Her MAR.  Review of systems essentially unobtainable secondary to severe dementia-however patient does not appear to be having any pain discomfort her GI issues appear to be stabilized.    .  .     PHYSICAL EXAMINATION: Dr. 97.6 pulse 70 respirations 20 blood pressure 139/79   GENERAL APPEARANCE:  The patient is not in any distress sitting in her chair comfortably.  Her skin is warm and dry.    CHEST/RESPIRATORY:  Clear air  entry bilaterally.    CARDIOVASCULAR:  CARDIAC:   Heart sounds are normal.  She appears to be euvolemic.   GASTROINTESTINAL:  ABDOMEN:   Slightly distended.   Bowel sounds are positive.  There are no masses.   [name]   PSYCHIATRIC:   MENTAL STATUS:   The patient was calm and cooperative throughout the exam-again with her daughter present patient tends to be calmer.  Labs.  06/20/2014.  Sodium 141 potassium 4.1 BUN 37 creatinine 1.59.  WBC 5.5 hemoglobin 12.6 platelets 220.  .    ASSESSMENT/PLAN:                    Dementia with behavioral disturbances. As stated above patient does have a history of bipolar disorder-per discussion with Dr. Dellia Nims will start Depakote 250 mg twice a day and monitor    Renal insufficiency-creatinine of 1.59 appears to be baseline per daughter patient drinks fairly well although eating continues to be a challenge at times-    .     CPT CODE: 16109

## 2014-06-30 DIAGNOSIS — D649 Anemia, unspecified: Secondary | ICD-10-CM | POA: Diagnosis not present

## 2014-07-04 ENCOUNTER — Other Ambulatory Visit (HOSPITAL_COMMUNITY)
Admission: RE | Admit: 2014-07-04 | Discharge: 2014-07-04 | Disposition: A | Discharge status: Home / Self Care | Payer: Medicare Other | Source: Ambulatory Visit | Attending: Internal Medicine | Admitting: Internal Medicine

## 2014-07-04 LAB — OCCULT BLOOD X 1 CARD TO LAB, STOOL: Fecal Occult Bld: NEGATIVE

## 2014-07-29 ENCOUNTER — Non-Acute Institutional Stay (SKILLED_NURSING_FACILITY): Payer: Medicare Other | Admitting: Internal Medicine

## 2014-07-29 ENCOUNTER — Encounter: Payer: Self-pay | Admitting: Internal Medicine

## 2014-07-29 DIAGNOSIS — F0391 Unspecified dementia with behavioral disturbance: Secondary | ICD-10-CM | POA: Diagnosis not present

## 2014-07-29 DIAGNOSIS — R1314 Dysphagia, pharyngoesophageal phase: Secondary | ICD-10-CM

## 2014-07-29 DIAGNOSIS — F03918 Unspecified dementia, unspecified severity, with other behavioral disturbance: Secondary | ICD-10-CM

## 2014-07-29 DIAGNOSIS — K3184 Gastroparesis: Secondary | ICD-10-CM | POA: Diagnosis not present

## 2014-07-29 DIAGNOSIS — R112 Nausea with vomiting, unspecified: Secondary | ICD-10-CM

## 2014-07-29 NOTE — Progress Notes (Signed)
Patient ID: LEIA TOFTE, female   DOB: [birth year], 79 y.o.   MRN: [id number]   his is a routine--acute  visit.   Level of care skilled.   Facility [facility] .   Chief complaint.   Management of chronic medical conditions including dementia-failure to thrive-hypertension-dysphagia with history of gastroparesis-history of breast cancer right-osteoporosis----   History of present illness  Patient is a 79 year old female has been a long-term resident at this facility she has significant dementia complicated with numerous medical issues including history of right breast cancer which is followed by oncology-she has periods where she does not do very well at all and then appears to rebound  She recently was restarted on Depakote secondary to some disinhibited behaviors as she was approaching female residents-apparently this has improved somewhat.  Last few days however she has not been as active-staying in bed more-vital signs appear to be stable at times she will have a slightly elevated temperature in the 99's-she just has not been herself according to the nurses as well as her daughter at bedside-her daughter also feels that her mom is having some more lipsmacking behaviors--she feels these increased after Dexilant was started-she does have a significant history of gastroparesis and dysphasia and was seen by GI--and received an endoscopy back in September it appears at that time theDexilant was started.  She previously been on Reglan but this was actually discontinued secondary to its side effect profile including tardive dyskinesia.    Regards to other issues--history of breast cancer -her tamoxifen was discontinued a while back secondary to failure to thrive not doing well however again she is doing better and the tamoxifen has been restarted however the dose has been reduced .      She does take fluids well according to her daughter who is actually dietitian the facility- solid food intake is  quite minimal according to her daughter she does not really touch her tray much Apparently she takes her supplements fairly well her weight appears to be stable recently in the low 130s although previously she lost a significant amount of weight   .   She also has a history of a right arm fracture she she is on calcium supplementation-she does have a history of osteoporosis--also has a recent small foot fracture which appears to have resolved fairly unremarkablly- ll .   She has a history of hypertension recent blood pressures appears stable  most recently 115/82 she is on Norvasc.       Family medical social history as been reviewed her history and physical on 03/20/2011 as well as numerous previous progress notes.    Medications have been reviewed per MAR.    Review of systems-very limited secondary to patient with a history of significant dementia--per nursing staff stay more in her room last couple days in bed more  e      Physical exam.   Temperature 98.4 pulse 56 respirations 20 blood pressure 115/82   2   In general this is a somewhat frail elderly female in no distress although she appears somewhat tired sitting in her chair l.   Her skin is warm and dry.-    Oropharynx -mucous membranes are  fairly moist-   Eyes sclera and conjunctiva are clear visual acuity appears grossly --   Chest is clear to auscultation without any rhonchi rales or wheezes no labored breathing. Respiratory effort quite poor however .   Heart is regular rate and rhythm without murmur gallop or rub-  she does not really have significant lower extremity edema   Abdomen is soft nontender with  active bowel sounds   GU- Could not really appreciate any specific suprapubic tenderness-although patient does grimace at times with any attempt at invasive procedure .   Musculoskeletal moves extremities x4 this appears to be baseline he did not note any deformities other than arthritic changes.     Neurologic appears to be grossly intact no lateralizing findings cranial nerves appear grossly intact speech is clear although quite confused-I did not really see increase lipsmacking behavior on exam although apparently she does have this at times.   Psych she is oriented to self only --pleasant cooperative but confused --actually less agitated with exam today than on previous exams    Labs 06/20/2014.  Sodium 141 potassium 4.1 BUN 37 creatinine 1.59.  WBC 5.5 hemoglobin 12.6 platelets 228.     05/06/2014.  WBC 10.7 hemoglobin 11.3 platelets 236.  Sodium 139 potassium 4.1 BUN 40 creatinine 1.65.    01/09/2014.  TSH-0.805.   .   01/09/2014.    WBC 12.5 hemoglobin 15.1 platelets 241.  Sodium 144 potassium 3.9 BUN 49 creatinine 1.7 to-liver function tests within normal limits albumin 3.7.    Assessment and plan.   #1-failure to thrive-her weight appears to have stabilized recently although apparently she is not eating much solid food-she is on supplements and probably takes these fairly well     #2-history of dementia with behaviors--as noted this is significant but relatively baseline her medications have been minimized -she has been placed back on Depakote-.    #3 history of  gastroparesis she continues on Zofran --she is also on Dexilant--however her daughter would like her to be taken off the [place] for a trial course this see if this will help with her lipsmacking behaviors-again this does not appear to be a common side effect with the medication but will do a trial course off and monitor .   #4-history of osteoporosis with history of fractures she is on calcium supplementation .   #5-history of hypertension this appears to be stable she is on Norvasc .   #6 as history constipation she is on MiraLax appears to be stable as well    #7 history of renal insufficiency-most recent creatinine appears to be baseline--1.59-- her daughter has requested minimal lab  draws-and will prefer no lab draws at this time   #8-history of breast cancer-again medications have been minimized secondary to patient comfort and family wishes she is on tamoxifen which was  decreased to 10 mg a day   #9-question some increased lethargy and less energy last few days-this was discussed with her daughter at bedside-again she would prefer conservative measures at this point no labs-however she is okay with ordering a UA C andS-continue to monitor with vital signs every shift to keep an eye on her.  [id number] note greater than 35 minutes spent assessing patient-discussing her daughter's concerns at bedside-discussing patient's status with nursing   Review of chart-and coordinating and formulating a plan of care for numerous diagnoses-of note greater than 50% of time spent coordinating plan of care

## 2014-07-30 ENCOUNTER — Other Ambulatory Visit (HOSPITAL_COMMUNITY)
Admission: AD | Admit: 2014-07-30 | Discharge: 2014-07-30 | Disposition: A | Discharge status: Home / Self Care | Payer: Medicare Other | Source: Skilled Nursing Facility | Attending: Internal Medicine | Admitting: Internal Medicine

## 2014-07-30 LAB — URINALYSIS, ROUTINE W REFLEX MICROSCOPIC
Bilirubin Urine: NEGATIVE
GLUCOSE, UA: NEGATIVE mg/dL
Ketones, ur: NEGATIVE mg/dL
Nitrite: NEGATIVE
PH: 5.5 (ref 5.0–8.0)
Protein, ur: 100 mg/dL — AB
Specific Gravity, Urine: 1.02 (ref 1.005–1.030)
Urobilinogen, UA: 0.2 mg/dL (ref 0.0–1.0)

## 2014-07-30 LAB — URINE MICROSCOPIC-ADD ON

## 2014-07-31 LAB — URINE CULTURE: Colony Count: 35000

## 2014-08-22 ENCOUNTER — Encounter (HOSPITAL_COMMUNITY)
Admission: AD | Admit: 2014-08-22 | Discharge: 2014-08-22 | Disposition: A | Discharge status: Home / Self Care | Payer: Medicare Other | Source: Skilled Nursing Facility | Attending: Internal Medicine | Admitting: Internal Medicine

## 2014-08-22 DIAGNOSIS — R69 Illness, unspecified: Secondary | ICD-10-CM | POA: Insufficient documentation

## 2014-08-22 DIAGNOSIS — E870 Hyperosmolality and hypernatremia: Secondary | ICD-10-CM | POA: Diagnosis not present

## 2014-08-22 LAB — HEPATIC FUNCTION PANEL
ALK PHOS: 54 U/L (ref 39–117)
ALT: 15 U/L (ref 0–35)
AST: 19 U/L (ref 0–37)
Albumin: 3.6 g/dL (ref 3.5–5.2)
BILIRUBIN DIRECT: 0.1 mg/dL (ref 0.0–0.5)
BILIRUBIN INDIRECT: 0.3 mg/dL (ref 0.3–0.9)
Total Bilirubin: 0.4 mg/dL (ref 0.3–1.2)
Total Protein: 6.8 g/dL (ref 6.0–8.3)

## 2014-08-22 LAB — CBC WITH DIFFERENTIAL/PLATELET
Basophils Absolute: 0 10*3/uL (ref 0.0–0.1)
Basophils Relative: 1 % (ref 0–1)
Eosinophils Absolute: 0.2 10*3/uL (ref 0.0–0.7)
Eosinophils Relative: 2 % (ref 0–5)
HCT: 40 % (ref 36.0–46.0)
HEMOGLOBIN: 12.7 g/dL (ref 12.0–15.0)
LYMPHS ABS: 1.9 10*3/uL (ref 0.7–4.0)
LYMPHS PCT: 29 % (ref 12–46)
MCH: 29.1 pg (ref 26.0–34.0)
MCHC: 31.8 g/dL (ref 30.0–36.0)
MCV: 91.7 fL (ref 78.0–100.0)
MONOS PCT: 9 % (ref 3–12)
Monocytes Absolute: 0.6 10*3/uL (ref 0.1–1.0)
NEUTROS PCT: 59 % (ref 43–77)
Neutro Abs: 3.9 10*3/uL (ref 1.7–7.7)
PLATELETS: 219 10*3/uL (ref 150–400)
RBC: 4.36 MIL/uL (ref 3.87–5.11)
RDW: 14.1 % (ref 11.5–15.5)
WBC: 6.5 10*3/uL (ref 4.0–10.5)

## 2014-08-22 LAB — VALPROIC ACID LEVEL: Valproic Acid Lvl: <10 ug/mL — ABNORMAL LOW (ref 50.0–100.0)

## 2014-09-02 ENCOUNTER — Encounter: Payer: Self-pay | Admitting: Gastroenterology

## 2014-10-14 ENCOUNTER — Other Ambulatory Visit (HOSPITAL_COMMUNITY)
Admission: RE | Admit: 2014-10-14 | Discharge: 2014-10-14 | Disposition: A | Discharge status: Home / Self Care | Payer: Medicare Other | Source: Skilled Nursing Facility | Attending: Internal Medicine | Admitting: Internal Medicine

## 2014-10-14 DIAGNOSIS — R41841 Cognitive communication deficit: Secondary | ICD-10-CM | POA: Diagnosis not present

## 2014-10-14 DIAGNOSIS — R300 Dysuria: Secondary | ICD-10-CM | POA: Diagnosis not present

## 2014-10-14 LAB — URINALYSIS W MICROSCOPIC (NOT AT ARMC)
Bilirubin Urine: NEGATIVE
Glucose, UA: NEGATIVE mg/dL
Ketones, ur: NEGATIVE mg/dL
Nitrite: NEGATIVE
Protein, ur: 30 mg/dL — AB
Specific Gravity, Urine: 1.015 (ref 1.005–1.030)
UROBILINOGEN UA: 0.2 mg/dL (ref 0.0–1.0)
pH: 6 (ref 5.0–8.0)

## 2014-10-17 LAB — URINE CULTURE: Colony Count: >=100000

## 2014-11-12 ENCOUNTER — Non-Acute Institutional Stay (SKILLED_NURSING_FACILITY): Payer: Medicare Other | Admitting: Internal Medicine

## 2014-11-12 ENCOUNTER — Encounter: Payer: Self-pay | Admitting: Internal Medicine

## 2014-11-12 DIAGNOSIS — F0391 Unspecified dementia with behavioral disturbance: Secondary | ICD-10-CM | POA: Diagnosis not present

## 2014-11-12 DIAGNOSIS — I10 Essential (primary) hypertension: Secondary | ICD-10-CM

## 2014-11-12 DIAGNOSIS — M810 Age-related osteoporosis without current pathological fracture: Secondary | ICD-10-CM

## 2014-11-12 DIAGNOSIS — K3184 Gastroparesis: Secondary | ICD-10-CM

## 2014-11-12 DIAGNOSIS — F03918 Unspecified dementia, unspecified severity, with other behavioral disturbance: Secondary | ICD-10-CM

## 2014-11-12 NOTE — Progress Notes (Signed)
Patient ID: [name], female   DOB: 19-Jan-1936, 79 y.o.   MRN: [id number]      routine-  visit.   Level of care skilled.   Facility [facility] .   Chief complaint.   Management of chronic medical conditions including dementia-failure to thrive-hypertension-dysphagia with history of gastroparesis-history of breast cancer right-osteoporosis----   History of present illness  Patient is a 79 year old female has been a long-term resident at this facility she has significant dementia complicated with numerous medical issues including history of right breast cancer which is followed by oncology-she has periods where she does not do very well at all and then appears to rebound  Currently she appears stable and has been for some time.  She was treated about a month ago forE Coli UTI this appears to have resolved unremarkably  She  was restarted on Depakote secondary to some disinhibited behaviors as she was approaching female residents-apparently this has improved    Patient does have a significant history of gastroparesis had been on Dexilant but this was DC'd secondary to family concerns this was causing some lip smacking behavior.  She previously been on Reglan but this was actually discontinued secondary to its side effect profile including tardive dyskinesia.  She is on Zofran 4 times a day    Regards to other issues--history of breast cancer -her tamoxifen was discontinued a while back secondary to failure to thrive not doing well  She is doing better in this regard but family prefers that not be restarted .      She does take fluids well according to her daughter who is actually dietitian the facility- solid food intake is still a challenge according to her daughter Apparently she takes her supplements fairly well her weight appears to be stable recently in the low 130s although previously she lost a significant amount of weight   .   She also has a history of a right arm fracture  she she is on calcium supplementation-she does have a history of osteoporosis--also has a recent small foot fracture which appears to have resolved fairly unremarkablly- ll .   She has a history of hypertension recent blood pressures appears stable  most recently 121/70-132/72 she is on Norvasc.       Family medical social history as been reviewed her history and physical on 03/20/2011 as well as numerous previous progress notes.    Medications have been reviewed per [facility] This includes   Norvasc 5 mg daily.  Aspirin 81 mg daily.  Depakote 250 mg twice a day.  MiraLAX daily.  Colace 50 mL daily.  Tylenol 325 mg twice a day.  Tylenol liquid 650 mg every 8 hours when necessary.  Zinc topical to buttocks when necessary.  Zofran 4 mg 4 times a day.  .    Review of systems-very limited secondary to patient with a history of significant dementia--per nursing staff and her daughter she is at baseline apparently is been doing quite well lately solid food intake continues to be a challenge but her weight is stable  e      Physical exam.   Temperature 97.9 pulse 70 respirations 20 blood pressure 121/70 weight is stable at 131   2   In general this is a somewhat frail elderly female in no distress sitting in her chair she is talkative and alert appears to be at her baseline l.   Her skin is warm and dry.-    Oropharynx -mucous membranes are  moist-   Eyes sclera and conjunctiva are clear visual acuity appears grossly --   Chest is clear to auscultation without any rhonchi rales or wheezes no labored breathing. Respiratory effort quite poor however .   Heart is regular rate and rhythm without murmur gallop or rub- she does not really have significant lower extremity edema   Abdomen is soft nontender with  active bowel sounds     .   Musculoskeletal moves extremities x4 this appears to be baseline he did not note any deformities other than arthritic changes.     Neurologic appears to be grossly intact no lateralizing findings cranial nerves appear grossly intact speech is clear although quite confused-I  .   Psych she is oriented to self only --pleasant cooperative but confused --    Labs  08/22/2014.  Depakote level less than 10.  Liver function tests within normal limits I note albumen was 3.6.  WBC 6.5 hemoglobin 12.7 platelets 219.  06/20/2014.  Sodium 141 potassium 4.1 BUN 37 creatinine 1.59   06/20/2014.  Sodium 141 potassium 4.1 BUN 37 creatinine 1.59.  WBC 5.5 hemoglobin 12.6 platelets 228.     05/06/2014.  WBC 10.7 hemoglobin 11.3 platelets 236.  Sodium 139 potassium 4.1 BUN 40 creatinine 1.65.    01/09/2014.  TSH-0.805.   .   01/09/2014.    WBC 12.5 hemoglobin 15.1 platelets 241.  Sodium 144 potassium 3.9 BUN 49 creatinine 1.7 to-liver function tests within normal limits albumin 3.7.    Assessment and plan.   #1-failure to thrive-her weight appears to have stabilized recently although apparently she is not eating much solid food-she is on supplements and probably takes these fairly well---r staff and daughter have been encouraging by mouth intake again weight appears to be stable     #2-history of dementia with behaviors--as noted this is significant but relatively baseline her medications have been minimized -she has been placed back on Depakote-.    #3 history of  gastroparesis she continues on Zofran --she iwasalso on Dexilant-- But this was discontinued secondary to concerns of daughter about it causing lipsmacking behavior-again there has been no further behaviors apparently-- gastroparesis appears relatively stable .   #4-history of osteoporosis with history of fractures she is on calcium supplementation .   #5-history of hypertension this appears to be stable she is on Norvasc .   #6 as history constipation she is on MiraLax appears to be stable as well    #7 history of renal  insufficiency-most recent creatinine appears to be baseline--1.59-- her daughter has requested minimal lab draws-and will prefer no lab draws at this time   #8-history of breast cancer-again medications have been minimized secondary to patient comfort and family wishes she no longer is on tamoxifen    [phone]

## 2014-12-10 ENCOUNTER — Encounter: Payer: Self-pay | Admitting: Internal Medicine

## 2014-12-10 ENCOUNTER — Non-Acute Institutional Stay (SKILLED_NURSING_FACILITY): Payer: Medicare Other | Admitting: Internal Medicine

## 2014-12-10 DIAGNOSIS — I10 Essential (primary) hypertension: Secondary | ICD-10-CM

## 2014-12-10 DIAGNOSIS — F0391 Unspecified dementia with behavioral disturbance: Secondary | ICD-10-CM | POA: Diagnosis not present

## 2014-12-10 DIAGNOSIS — N289 Disorder of kidney and ureter, unspecified: Secondary | ICD-10-CM | POA: Diagnosis not present

## 2014-12-10 DIAGNOSIS — L0390 Cellulitis, unspecified: Secondary | ICD-10-CM | POA: Insufficient documentation

## 2014-12-10 DIAGNOSIS — L03114 Cellulitis of left upper limb: Secondary | ICD-10-CM | POA: Diagnosis not present

## 2014-12-10 DIAGNOSIS — F03918 Unspecified dementia, unspecified severity, with other behavioral disturbance: Secondary | ICD-10-CM

## 2014-12-10 NOTE — Progress Notes (Signed)
Patient ID: [name], female   DOB: 15-Jul-1935, 79 y.o.   MRN: [id number]        routine-  visit.   Level of care skilled.   Facility [facility] .   Chief complaint.   Management of chronic medical conditions including dementia-failure to thrive-hypertension-dysphagia with history of gastroparesis-history of breast cancer right-osteoporosis--- Acute visit secondary to left elbow cellulitis-   History of present illness  Patient is a 79 year old female has been a long-term resident at this facility she has significant dementia complicated with numerous medical issues including history of right breast cancer which is followed by oncology-she has periods where she does not do very well at all and then appears to rebound  Currently she appears stable and has been for some time.  She was treated abouttwo month ago forE Coli UTI this appears to have resolved unremarkably  She  was restarted on Depakote secondary to some disinhibited behaviors as she was approaching female residents-apparently this has improved    Patient does have a significant history of gastroparesis had been on Dexilant but this was DC'd secondary to family concerns this was causing some lip smacking behavior.  She previously been on Reglan but this was actually discontinued secondary to its side effect profile including tardive dyskinesia.  She is on Zofran 4 times a day    Regards to other issues--history of breast cancer -her tamoxifen was discontinued a while back secondary to failure to thrive not doing well  She is doing better in this regard but family prefers that not be restarted .      She does take fluids well according to her daughter who is actually dietitian the facility- solid food intake is still a challenge according to her daughter Apparently she takes her supplements fairly well her weight appears to be stable recently in the low 130s--129.2 most recently- although previously she lost a  significant amount of weight   .   She also has a history of a right arm fracture she she is on calcium supplementation-she does have a history of osteoporosis--also has a recent small foot fracture which appears to have resolved fairly unremarkablly- ll .   She has a history of hypertension recent blood pressures appears stable  most recently 123/68-118/62-99/65 although this appears to be somewhat unusual--she is on Norvasc  Most acute issue is what started out as a skin tear on her left elbow-however this has now developed some fairly significant erythema and warmth around the site suspect this is cellulitis and will need an antibiotic.       Family medical social history  reviewed per history and physical on 03/20/2011 as well as numerous previous progress notes.    Medications have been reviewed per [facility] This includes   Norvasc 5 mg daily.  Aspirin 81 mg daily.  Depakote 250 mg twice a day.  MiraLAX daily.  Colace 50 mL daily.  Tylenol 325 mg twice a day.  Tylenol liquid 650 mg every 8 hours when necessary.  Zinc topical to buttocks when necessary.  Zofran 4 mg 4 times a day.  .    Review of systems-very limited secondary to patient with a history of significant dementia--per nursing staff and her daughter she is at baseline apparently is been doing quite well lately solid food intake continues to be a challenge but her weight is stable [place] issue on her left elbow as noted above  e      Physical exam.  Temperature 98.5 pulse 64 respirations 18 blood pressure 123/68 weight is stable at 129.2   2   In general this is a somewhat frail elderly female in no distress sitting in her chair she is talkative and alert appears to be at her baselineshe is somewhat agitated with exam l.   Her skin is warm and dry.-left elbow there is an area of erythema and warmth this is increased from previous exam this appears to be surrounding what was a skin tear    Oropharynx  -mucous membranes are   moist-   Eyes sclera and conjunctiva are clear visual acuity appears grossly --   Chest is clear to auscultation without any rhonchi rales or wheezes no labored breathing. Respiratory effort quite poor however .   Heart is regular rate and rhythm without murmur gallop or rub- she does not really have significant lower extremity edema   Abdomen --could not really assess secondary to patient agitation     .   Musculoskeletal moves extremities x4 this appears to be baseline he did not note any deformities other than arthritic changes.   Neurologic appears to be grossly intact no lateralizing findings cranial nerves appear grossly intact speech is clear although quite confused-I  .   Psych she is oriented to self only -agitated with exam today which happens occasionally-    Labs  08/22/2014.  Depakote level less than 10.  Liver function tests within normal limits I note albumen was 3.6.  WBC 6.5 hemoglobin 12.7 platelets 219.  06/20/2014.  Sodium 141 potassium 4.1 BUN 37 creatinine 1.59   06/20/2014.  Sodium 141 potassium 4.1 BUN 37 creatinine 1.59.  WBC 5.5 hemoglobin 12.6 platelets 228.     05/06/2014.  WBC 10.7 hemoglobin 11.3 platelets 236.  Sodium 139 potassium 4.1 BUN 40 creatinine 1.65.    01/09/2014.  TSH-0.805.   .   01/09/2014.    WBC 12.5 hemoglobin 15.1 platelets 241.  Sodium 144 potassium 3.9 BUN 49 creatinine 1.7 to-liver function tests within normal limits albumin 3.7.    Assessment and plan.   #1-failure to thrive-her weight appears to have stabilized recently although apparently she is not eating much solid food-she is on supplements and probably takes these fairly well---r staff and daughter have been encouraging by mouth intake again weight appears to be stable     #2-history of dementia with behaviors--as noted this is significant but relatively baseline her medications have been minimized -she has been placed  back on Depakote-.    #3 history of  gastroparesis she continues on Zofran --she iwasalso on Dexilant-- But this was discontinued secondary to concerns of daughter about it causing lipsmacking behavior-again there has been no further behaviors apparently-- gastroparesis appears relatively stable .   #4-history of osteoporosis with history of fractures she is on calcium supplementation .   #5-history of hypertension this appears to be stable she is on Norvasc .   #6 as history constipation she is on MiraLax appears to be stable as well    #7 history of renal insufficiency-most recent creatinine appears to be baseline--1.59-- her daughter has requested minimal lab draws-and will prefer no lab draws at this time   #8-history of breast cancer-again medications have been minimized secondary to patient comfort and family wishes she no longer is on tamoxifen  #9 left elbow cellulitis-will treat with doxycycline 100 mg twice a day for 10 days at also will add a probiotic-monitor for any changes and culture any possible drainage.  [id number]

## 2014-12-15 ENCOUNTER — Encounter: Payer: Self-pay | Admitting: Internal Medicine

## 2014-12-15 ENCOUNTER — Non-Acute Institutional Stay (SKILLED_NURSING_FACILITY): Payer: Medicare Other | Admitting: Internal Medicine

## 2014-12-15 DIAGNOSIS — L0390 Cellulitis, unspecified: Secondary | ICD-10-CM | POA: Diagnosis not present

## 2014-12-15 DIAGNOSIS — Z5320 Procedure and treatment not carried out because of patient's decision for unspecified reasons: Secondary | ICD-10-CM

## 2014-12-15 NOTE — Progress Notes (Signed)
Patient ID: [name], female   DOB: [birth year], 79 y.o.   MRN: [id number]          acute-  visit.   Level of care skilled.   Facility [facility] .   Chief complaint.    Acute visit follow-up left elbow cellulitis-patient refusing oral antibiotic   History of present illness  Patient is a 79 year old female has been a long-term resident at this facility she has significant dementia complicated with numerous medical issues   Most acute issue is what started out as a skin tear on her left elbow-however this developed some fairly significant erythema and warmth --she was started on doxycycline 100 mg twice a day for suspicions of cellulitis.  She has taken this some but apparently is refusing it is significant amount of time as well.  Nursing is asking about possibly doing an IM antibiotic to make sure that she receives antibiotic therapy.   she is afebrile vital signs are sable       Family medical social history  reviewed per history and physical on 03/20/2011 .    Medications have been reviewed per [facility] This includes   Norvasc 5 mg daily.  Aspirin 81 mg daily.  Depakote 250 mg twice a day.  MiraLAX daily.  Colace 50 mL daily.  Tylenol 325 mg twice a day.  Tylenol liquid 650 mg every 8 hours when necessary.  Zinc topical to buttocks when necessary.  Zofran 4 mg 4 times a day  Again she has been receiving doxycycline 100 mg twice a day but is refusing this often.  .    Review of systems-very limited secondary to patient with a history of significant dementia--per nursing staff and her daughter she is at baseline -- [place] issue on her left elbow as noted above  e      Physical exam.    Temperature 98.0 pulse 75 respirations 16 blood pressure 118/70   2   In general this is a somewhat frail elderly female in no distress sitting in her chair she is talkative and alert appears to be at her baselineshe is somewhat agitated with exam l.   Her skin is  warm and dry.-left elbow there is an area of erythema and warmth this is decreased from previous exam this appears to be surrounding what was a skin tear--there is no drainage or bleeding this appears to be  improving    Oropharynx -mucous membranes are   moist-   Eyes sclera and conjunctiva are clear visual acuity appears grossly --   Chest is clear to auscultation without any rhonchi rales or wheezes no labored breathing. Respiratory effort quite poor however .        Labs  08/22/2014.  Depakote level less than 10.  Liver function tests within normal limits I note albumen was 3.6.  WBC 6.5 hemoglobin 12.7 platelets 219.  06/20/2014.  Sodium 141 potassium 4.1 BUN 37 creatinine 1.59   06/20/2014.  Sodium 141 potassium 4.1 BUN 37 creatinine 1.59.  WBC 5.5 hemoglobin 12.6 platelets 228.     05/06/2014.  WBC 10.7 hemoglobin 11.3 platelets 236.  Sodium 139 potassium 4.1 BUN 40 creatinine 1.65.    01/09/2014.  TSH-0.805.   .   01/09/2014.    WBC 12.5 hemoglobin 15.1 platelets 241.  Sodium 144 potassium 3.9 BUN 49 creatinine 1.7 to-liver function tests within normal limits albumin 3.7.    Assessment and plan.    . Suspected left elbow cellulitis complicated with patient  refusing oral antibiotic-clinically this appears to be improving apparently the doxycycline has done some good-however there is still some residual erythema here we'll treat with a short course of Rocephin IM for 3 days and monitor will discontinue the doxycycline this was discussed with her daughter at bedside.  Apparently she is also having some itching will add hydrocortisone cream twice a day when necessary for this      [id number]

## 2015-01-21 ENCOUNTER — Non-Acute Institutional Stay (SKILLED_NURSING_FACILITY): Payer: Medicare Other | Admitting: Internal Medicine

## 2015-01-21 ENCOUNTER — Encounter (HOSPITAL_COMMUNITY)
Admission: AD | Admit: 2015-01-21 | Discharge: 2015-01-21 | Disposition: A | Discharge status: Home / Self Care | Payer: Medicare Other | Source: Skilled Nursing Facility | Attending: Internal Medicine | Admitting: Internal Medicine

## 2015-01-21 ENCOUNTER — Encounter (HOSPITAL_COMMUNITY)
Admission: RE | Admit: 2015-01-21 | Discharge: 2015-01-21 | Disposition: A | Discharge status: Home / Self Care | Payer: Medicare Other | Source: Skilled Nursing Facility | Attending: Internal Medicine | Admitting: Internal Medicine

## 2015-01-21 DIAGNOSIS — I10 Essential (primary) hypertension: Secondary | ICD-10-CM | POA: Diagnosis not present

## 2015-01-21 DIAGNOSIS — K922 Gastrointestinal hemorrhage, unspecified: Secondary | ICD-10-CM | POA: Diagnosis not present

## 2015-01-21 DIAGNOSIS — K8050 Calculus of bile duct without cholangitis or cholecystitis without obstruction: Secondary | ICD-10-CM | POA: Diagnosis not present

## 2015-01-21 DIAGNOSIS — R112 Nausea with vomiting, unspecified: Secondary | ICD-10-CM

## 2015-01-21 DIAGNOSIS — R8290 Unspecified abnormal findings in urine: Secondary | ICD-10-CM | POA: Diagnosis not present

## 2015-01-21 DIAGNOSIS — F411 Generalized anxiety disorder: Secondary | ICD-10-CM | POA: Diagnosis not present

## 2015-01-21 LAB — CBC WITH DIFFERENTIAL/PLATELET
Basophils Absolute: 0 10*3/uL (ref 0.0–0.1)
Basophils Relative: 0 %
EOS PCT: 0 %
Eosinophils Absolute: 0 10*3/uL (ref 0.0–0.7)
HEMATOCRIT: 48.7 % — AB (ref 36.0–46.0)
Hemoglobin: 14.9 g/dL (ref 12.0–15.0)
LYMPHS PCT: 10 %
Lymphs Abs: 1.4 10*3/uL (ref 0.7–4.0)
MCH: 29.4 pg (ref 26.0–34.0)
MCHC: 30.6 g/dL (ref 30.0–36.0)
MCV: 96.2 fL (ref 78.0–100.0)
MONO ABS: 0.9 10*3/uL (ref 0.1–1.0)
Monocytes Relative: 6 %
Neutro Abs: 11.3 10*3/uL — ABNORMAL HIGH (ref 1.7–7.7)
Neutrophils Relative %: 84 %
PLATELETS: 225 10*3/uL (ref 150–400)
RBC: 5.06 MIL/uL (ref 3.87–5.11)
RDW: 14.6 % (ref 11.5–15.5)
WBC: 13.6 10*3/uL — ABNORMAL HIGH (ref 4.0–10.5)

## 2015-01-21 LAB — COMPREHENSIVE METABOLIC PANEL
ALT: 36 U/L (ref 14–54)
ANION GAP: 9 (ref 5–15)
AST: 29 U/L (ref 15–41)
Albumin: 4.3 g/dL (ref 3.5–5.0)
Alkaline Phosphatase: 69 U/L (ref 38–126)
BUN: 51 mg/dL — AB (ref 6–20)
CHLORIDE: 118 mmol/L — AB (ref 101–111)
CO2: 29 mmol/L (ref 22–32)
Calcium: 9.8 mg/dL (ref 8.9–10.3)
Creatinine, Ser: 2.11 mg/dL — ABNORMAL HIGH (ref 0.44–1.00)
GFR calc Af Amer: 25 mL/min — ABNORMAL LOW (ref >60)
GFR, EST NON AFRICAN AMERICAN: 21 mL/min — AB (ref >60)
Glucose, Bld: 130 mg/dL — ABNORMAL HIGH (ref 65–99)
POTASSIUM: 4.4 mmol/L (ref 3.5–5.1)
Sodium: 156 mmol/L — ABNORMAL HIGH (ref 135–145)
Total Bilirubin: 0.7 mg/dL (ref 0.3–1.2)
Total Protein: 7.7 g/dL (ref 6.5–8.1)

## 2015-01-21 LAB — URINALYSIS W MICROSCOPIC (NOT AT ARMC)
Bilirubin Urine: NEGATIVE
Glucose, UA: NEGATIVE mg/dL
KETONES UR: NEGATIVE mg/dL
LEUKOCYTES UA: NEGATIVE
NITRITE: NEGATIVE
PH: 7 (ref 5.0–8.0)
Protein, ur: 100 mg/dL — AB
Specific Gravity, Urine: 1.015 (ref 1.005–1.030)
UROBILINOGEN UA: 0.2 mg/dL (ref 0.0–1.0)

## 2015-01-21 LAB — VALPROIC ACID LEVEL: VALPROIC ACID LVL: 16 ug/mL — AB (ref 50.0–100.0)

## 2015-01-21 NOTE — Progress Notes (Signed)
Patient ID: [name], female   DOB: May 07, 1936, 79 y.o.   MRN: [id number]     Facility; [facility] Chief complaint; vomiting 2. Guaiac positive History; this is a patient with advanced dementia. According to the staff she eats very little but does drink adequately most the time. Apparently last evening was noted to vomit some brown material she is done this again this morning. The material tests guaiac positive. The patient has a history of nausea and vomiting. Was worked up by GI for this in 2014 including a endoscopy that showed Astra can duodenal erosions. She had a small hiatal hernia. Her esophagus was normal. She went on to have a ultrasound of her gallbladder that showed gallstones with a thickened gallbladder wall but no evidence of acute cholecystitis. To my knowledge she has not had any vomiting in quite some time. Roughly 2 years ago we did have a protracted episode of nausea and vomiting probably secondary to acute cholecystitis. At that point in time the patient really had a marginal by mouth intake over this gradually resolved. She required IV fluids at the time.  Past Medical History  Diagnosis Date  . Dementia   . Gastroparalysis   . HTN (hypertension)   . Gallstones   . Reflux   . Cancer   . Osteoporosis 11/18/2011  . Infiltrating ductal carcinoma of right female breast 12/02/2009    Qualifier: History of  By: [name] CMA [name]), June     Past Surgical History  Procedure Laterality Date  . Tonsillectomy and adenoidectomy    . Breast biopsy    . Breast lumpectomy    . Esophagogastroduodenoscopy  12/11/2009    Dr. Fuller Plan: hiatal hernia  . Colonoscopy  2009    Dr. Fuller Plan: normal  . Esophagogastroduodenoscopy N/A 01/29/2014    Procedure: ESOPHAGOGASTRODUODENOSCOPY (EGD);  Surgeon: [name], MD;  Location: [facility];  Service: Endoscopy;  Laterality: N/A;  245    Current medications ASA 81 daily Zofran 4 mg 4 times daily when necessary Tylenol 325 one by mouth  twice a day Amlodipine 5 mg daily Milk of magnesia 30 ML's daily when necessary Depakote 250 twice daily; there Depakote was actually stopped yesterday apparently due to lethargy Tylenol when necessary Os-Cal 500+ D1 by mouth twice a day  Social history; the patient is a DO NOT RESUSCITATE. Her daughter is her spokesperson and works as the Microbiologist in the facility. There has not been a purely comfort care philosophy.  Review of systems; this is not possible from the patient secondary to advanced dementia O ever in talking to the nurses she has not lost any weight although she eats marginally she will drink including supplements supplement includes boost glucose control with all meals 3 times daily  Physical examination Gen. the patient does not look acutely all but certainly chronically ill. Vitals; temperature is 97.7 pulse 110 blood pressure 180/91 respirations 18 and unlabored. HEENT what I can see her mucous membranes were moist. Respiratory clear entry bilaterally Cardiac heart sounds are tachycardia 2/6 pansystolic murmur at the PMI that radiates to the axilla Abdomen; she is not distended. Bowel sounds are present. She becomes very agitated with any attempted exam however given these limitations there was no tenderness no clear guarding or rebound especially in the right upper quadrant. Rectal; moderate amount of formed stool which is OB negative Extremities; no edema and no evidence of a DVT Breast; she has a scar in the right lateral breast from previous  breast cancer surgery. There is no palpable lesion in either breast. Lymph; none palpable in the cervical clavicular or axillary areas GU; bladder is not clearly distended there is no CVA tenderness.  Impression/plan #1 vomiting of occult blood positive material. She does not appear to be systemically unwell. She is  tachycardic her blood pressure is actually high probably from the stress of the moment. Her biopsy last year an  endoscopy showed chronic gastritis certainly upper GI issues including gastritis peptic ulcer disease or duodenal I does are possible. She had a small hiatal hernia I'm doubtful that this is contributing. She also could have vomiting related to cholecystitis with vomiting related to gastric injury. Her rectal exam does not show guaiac positive material she is not actively bleeding. We are going to get comprehensive metabolic panel CBC and differential off stat. Also a valproic acid level. #2 chronic gastritis; according to reviewing GIs last note. There was a recommendation for Dexilant. Area in she is not on that currently. I'm not exactly certain where that got dropped off. It may be that there was difficulty getting her to take this although I'm not sure. #3 history of cholelithiasis. 2 years ago I felt that this was probably acute cholecystitis that she was exhibiting. This was managed conservatively. Right now I am not certain if this is playing any role at all. Although it is difficult to examine her as she becomes very restless with abdominal exam, I am not able to reproduce any tenderness in this area.  For now we are going to move to a full fluid diet. I am going to start her on Protonix. I'll see how she does with fluid intake and if there is any further vomiting. If so she may need an IV fluid. This will also give time for her lab work to come back to look at her liver function tests white count and hemoglobin. Depending on how this situation of also an abdominal ultrasound may be necessary although it would be difficult to get her to cooperate. Her aspirin will be put on hold. Not quite sure why the Depakote was put on hold yesterday, although it would not be unreasonable to continue to hold this as well. Hemoglobin was 12.7 in April for comparison to today's value

## 2015-01-22 ENCOUNTER — Encounter (HOSPITAL_COMMUNITY)
Admission: AD | Admit: 2015-01-22 | Discharge: 2015-01-22 | Disposition: A | Discharge status: Home / Self Care | Payer: Medicare Other | Source: Skilled Nursing Facility | Attending: Internal Medicine | Admitting: Internal Medicine

## 2015-01-22 DIAGNOSIS — I10 Essential (primary) hypertension: Secondary | ICD-10-CM | POA: Diagnosis not present

## 2015-01-22 DIAGNOSIS — R8290 Unspecified abnormal findings in urine: Secondary | ICD-10-CM | POA: Diagnosis not present

## 2015-01-22 DIAGNOSIS — F411 Generalized anxiety disorder: Secondary | ICD-10-CM | POA: Diagnosis not present

## 2015-01-22 LAB — CBC WITH DIFFERENTIAL/PLATELET
BASOS ABS: 0.1 10*3/uL (ref 0.0–0.1)
Basophils Relative: 0 %
EOS PCT: 0 %
Eosinophils Absolute: 0 10*3/uL (ref 0.0–0.7)
HCT: 50.1 % — ABNORMAL HIGH (ref 36.0–46.0)
Hemoglobin: 14.6 g/dL (ref 12.0–15.0)
LYMPHS PCT: 12 %
Lymphs Abs: 1.4 10*3/uL (ref 0.7–4.0)
MCH: 28.7 pg (ref 26.0–34.0)
MCHC: 29.1 g/dL — ABNORMAL LOW (ref 30.0–36.0)
MCV: 98.4 fL (ref 78.0–100.0)
MONO ABS: 0.6 10*3/uL (ref 0.1–1.0)
Monocytes Relative: 5 %
Neutro Abs: 9.7 10*3/uL — ABNORMAL HIGH (ref 1.7–7.7)
Neutrophils Relative %: 83 %
PLATELETS: 223 10*3/uL (ref 150–400)
RBC: 5.09 MIL/uL (ref 3.87–5.11)
RDW: 14.4 % (ref 11.5–15.5)
WBC: 11.7 10*3/uL — ABNORMAL HIGH (ref 4.0–10.5)

## 2015-01-22 LAB — VALPROIC ACID LEVEL: Valproic Acid Lvl: <10 ug/mL — ABNORMAL LOW (ref 50.0–100.0)

## 2015-01-22 LAB — COMPREHENSIVE METABOLIC PANEL
ALK PHOS: 70 U/L (ref 38–126)
ALT: 73 U/L — ABNORMAL HIGH (ref 14–54)
AST: 51 U/L — AB (ref 15–41)
Albumin: 4.2 g/dL (ref 3.5–5.0)
Anion gap: 12 (ref 5–15)
BILIRUBIN TOTAL: 0.6 mg/dL (ref 0.3–1.2)
BUN: 39 mg/dL — AB (ref 6–20)
CALCIUM: 9.3 mg/dL (ref 8.9–10.3)
CO2: 27 mmol/L (ref 22–32)
CREATININE: 1.93 mg/dL — AB (ref 0.44–1.00)
Chloride: 113 mmol/L — ABNORMAL HIGH (ref 101–111)
GFR calc Af Amer: 28 mL/min — ABNORMAL LOW (ref >60)
GFR calc non Af Amer: 24 mL/min — ABNORMAL LOW (ref >60)
GLUCOSE: 154 mg/dL — AB (ref 65–99)
POTASSIUM: 4.1 mmol/L (ref 3.5–5.1)
Sodium: 152 mmol/L — ABNORMAL HIGH (ref 135–145)
TOTAL PROTEIN: 7.8 g/dL (ref 6.5–8.1)

## 2015-01-22 LAB — TSH: TSH: 0.701 u[IU]/mL (ref 0.350–4.500)

## 2015-01-23 ENCOUNTER — Non-Acute Institutional Stay (SKILLED_NURSING_FACILITY): Payer: Medicare Other | Admitting: Internal Medicine

## 2015-01-23 ENCOUNTER — Encounter (HOSPITAL_COMMUNITY)
Admission: RE | Admit: 2015-01-23 | Discharge: 2015-01-23 | Disposition: A | Discharge status: Home / Self Care | Payer: Medicare Other | Source: Ambulatory Visit | Attending: Internal Medicine | Admitting: Internal Medicine

## 2015-01-23 DIAGNOSIS — E870 Hyperosmolality and hypernatremia: Secondary | ICD-10-CM

## 2015-01-23 DIAGNOSIS — I10 Essential (primary) hypertension: Secondary | ICD-10-CM | POA: Diagnosis not present

## 2015-01-23 DIAGNOSIS — R112 Nausea with vomiting, unspecified: Secondary | ICD-10-CM

## 2015-01-23 DIAGNOSIS — D72829 Elevated white blood cell count, unspecified: Secondary | ICD-10-CM

## 2015-01-23 DIAGNOSIS — R8290 Unspecified abnormal findings in urine: Secondary | ICD-10-CM | POA: Diagnosis not present

## 2015-01-23 DIAGNOSIS — F411 Generalized anxiety disorder: Secondary | ICD-10-CM | POA: Diagnosis not present

## 2015-01-23 LAB — URINE CULTURE: Culture: NO GROWTH

## 2015-01-23 LAB — CBC
HCT: 42.4 % (ref 36.0–46.0)
Hemoglobin: 13.2 g/dL (ref 12.0–15.0)
MCH: 29.4 pg (ref 26.0–34.0)
MCHC: 31.1 g/dL (ref 30.0–36.0)
MCV: 94.4 fL (ref 78.0–100.0)
Platelets: 172 10*3/uL (ref 150–400)
RBC: 4.49 MIL/uL (ref 3.87–5.11)
RDW: 14 % (ref 11.5–15.5)
WBC: 8.5 10*3/uL (ref 4.0–10.5)

## 2015-01-23 NOTE — Progress Notes (Signed)
Patient ID: [name], female   DOB: 1935-09-10, 79 y.o.   MRN: [id number]      Facility; [facility]  this is an acute visit.   Chief complaint; -- acute visit following up vomiting episodes occult positive -leukocytosis  HPi--; this is a patient with advanced dementia.  Earlier this week she had a couple vomiting episodes that did guaiac positive. Was worked up by GI for this in 2014 including a endoscopy that showed Astra can duodenal erosions. She had a small hiatal hernia. Her esophagus was normal. She went on to have a ultrasound of her gallbladder that showed gallstones with a thickened gallbladder wall but no evidence of acute cholecystitis.  [name] Roughly 2 years ago we did have a protracted episode of nausea and vomiting probably secondary to acute cholecystitis. At that point in time the patient really had a marginal by mouth intake over this gradually resolved. She required IV fluids at the time.   Dr. Dellia Nims did treat this aggressively she has been started on Rocephin was noted she had an elevated white count of 13.6 on September 14 as of today this has come down to 8.5 she is afebrile--  So far urine and blood cultures have been negative.  She also received a couple liters of IV fluids her sodium initially was 156 as of yesterday had come down to 1 52-her creatinine is 1.93 which appears to be at the higher end of her baseline.  Today she is alert sitting up in her chair apparently is drinking some she did have one episode of vomiting this morning although this could be from rying to may be eating or drinking a bit to much at one time this is somewhat unclear there've been no further episodes throughout the day    .    Past Medical History  Diagnosis Date  . Dementia   . Gastroparalysis   . HTN (hypertension)   . Gallstones   . Reflux   . Cancer   . Osteoporosis 11/18/2011  . Infiltrating ductal carcinoma of right female breast 12/02/2009    Qualifier: History of  By:  [name] CMA [name]), June     Past Surgical History  Procedure Laterality Date  . Tonsillectomy and adenoidectomy    . Breast biopsy    . Breast lumpectomy    . Esophagogastroduodenoscopy  12/11/2009    Dr. Fuller Plan: hiatal hernia  . Colonoscopy  2009    Dr. Fuller Plan: normal  . Esophagogastroduodenoscopy N/A 01/29/2014    Procedure: ESOPHAGOGASTRODUODENOSCOPY (EGD);  Surgeon: [name], MD;  Location: [facility];  Service: Endoscopy;  Laterality: N/A;  245    Current medications ASA 81 daily Zofran 4 mg 4 times daily when necessary Tylenol 325 one by mouth twice a day Amlodipine 5 mg daily Milk of magnesia 30 ML's daily when necessary  Tylenol when necessary Os-Cal 500+ D1 by mouth twice a day  Rocephin daily at bedtime  Social history; the patient is a DO NOT RESUSCITATE. Her daughter is her spokesperson and works as the Microbiologist in the facility. There has not been a purely comfort care philosophy.  Review of systems; this is not possible from the patient secondary to advanced dementia   apparently she is drinking some and taking  Dietary supplements  Physical examination   temperature 97.2 pulse 92 respirations 20 blood pressure 152/73  Her skin is warm and dry HEENT what I can see her mucous membranes were moist. Respiratory clear entry  bilaterally Cardiac heart sounds are RRR- 2/6 pansystolic murmur -- does not have significant lower extremity edema Abdomen; she is not distended. Bowel sounds are present. She becomes very agitated with any attempted exam however given these limitations there was no tenderness no clear guarding or rebound t Extremities; no edema and no evidence of a DVT   GU; bladder is not clearly distended there is no CVA tenderness.  neurologic- peers to move all her extremities at baseline I do not see any lateralizing findings speech is clear but confused.   musculoskeletal as noted above moves all extremities at baseline.  Psych she does have  significant dementia is pleasant smiling but agitated with attempts at exam.  Labs.  01/23/2015.  WBC 8.5 hemoglobin 13.2 platelets 172.  Again white count was 13.6 on September 14.   01/22/2015.  Valproic acid level less than 10. [id number].  Sodium 152 potassium 4.1 BUN 39 creatinine 1.93. AST 51ALT 73.  01/21/2015.  Sodium was 156 potassium 4.4 BUN 51 creatinine 2.11  Impression/plan #1 vomiting of occult blood positive material.   as noted above could be numerous etiologies -GI related- clinically she does not appear to be unstable hemoglobin is stable at 13 point 2 Will update this on Monday, September 19 -  vomiting appears to have largely subsided she did have an episode early this morning although this could be due to trying to eat or drink much at the same time  she has been started on a PPI . 2 # history of cholelithiasis. 2 years ago I felt that this was probably acute cholecystitis that she was exhibiting. This was managed conservatively. Liverr function tests show mild AST ALT elevation-- she is not really exhibiting GI tenderness- Dr. Dellia Nims did start her on a PPI   #3 hypernatremia this appears to be improving-this was discussed with Dr. Dellia Nims via phone and we'll give her 2 additional liters of IV half normal saline and recheck a metabolic panel on Monday, September 19   #4 leukocytosis-this appears to have normalized she is on Rocephin- urine culture and blood cultures so far  have been negative   [id number]

## 2015-01-24 ENCOUNTER — Non-Acute Institutional Stay (SKILLED_NURSING_FACILITY): Payer: Medicare Other | Admitting: Internal Medicine

## 2015-01-24 DIAGNOSIS — K922 Gastrointestinal hemorrhage, unspecified: Secondary | ICD-10-CM

## 2015-01-24 DIAGNOSIS — K8050 Calculus of bile duct without cholangitis or cholecystitis without obstruction: Secondary | ICD-10-CM | POA: Diagnosis not present

## 2015-01-24 DIAGNOSIS — R112 Nausea with vomiting, unspecified: Secondary | ICD-10-CM | POA: Diagnosis not present

## 2015-01-24 NOTE — Progress Notes (Signed)
Patient ID: [name], female   DOB: May 06, 1936, 79 y.o.   MRN: [id number]    Facility; [facility] Chief complaint; follow-up nausea vomiting dehydration History; this is a patient with advanced dementia. According to the staff she eats very little but does drink adequately most the time. 3 days ago was noted to vomit some brown material. The material tests guaiac positive. The patient has a history of nausea and vomiting. Was worked up by GI for this in 2014 including a endoscopy that showed Astra can duodenal erosions. She had a small hiatal hernia. Her esophagus was normal. She went on to have a ultrasound of her gallbladder that showed gallstones with a thickened gallbladder wall but no evidence of acute cholecystitis. To my knowledge she has not had any vomiting in quite some time. Roughly 2 years ago we did have a protracted episode of nausea and vomiting probably secondary to acute cholecystitis. At that point in time the patient really had a marginal by mouth intake over this gradually resolved. She required IV fluids at the time.  When I saw her on 9/14 I put her Lasix on hold. Put her on a proton pump inhibitor. Gave her a full fluid diet and IV one half normal saline at 3 L. I gave her emperically Rocephin both for the possibility of a UTI and the possibility of acute cholecystitis. She seemed to settle down quite well however her daughter tells me she had several episodes of vomiting yesterday morning. It is not clear whether they have retested the guaiac here. Urine culture and blood culture were negative. She was hypernatremic and in acute renal failure. She received another 2 L of IV fluids starting yesterday.  CBC Latest Ref Rng 01/23/2015 01/22/2015 01/21/2015  WBC 4.0 - 10.5 K/uL 8.5 11.7(H) 13.6(H)  Hemoglobin 12.0 - 15.0 g/dL 13.2 14.6 14.9  Hematocrit 36.0 - 46.0 % 42.4 50.1(H) 48.7(H)  Platelets 150 - 400 K/uL 172 223 225    BMP Latest Ref Rng 01/22/2015 01/21/2015 06/20/2014    Glucose 65 - 99 mg/dL 154(H) 130(H) 98  BUN 6 - 20 mg/dL 39(H) 51(H) 37(H)  Creatinine 0.44 - 1.00 mg/dL 1.93(H) 2.11(H) 1.59(H)  Sodium 135 - 145 mmol/L 152(H) 156(H) 141  Potassium 3.5 - 5.1 mmol/L 4.1 4.4 4.1  Chloride 101 - 111 mmol/L 113(H) 118(H) 107  CO2 22 - 32 mmol/L 27 29 28   Calcium 8.9 - 10.3 mg/dL 9.3 9.8 9.3      Past Medical History  Diagnosis Date  . Dementia   . Gastroparalysis   . HTN (hypertension)   . Gallstones   . Reflux   . Cancer   . Osteoporosis 11/18/2011  . Infiltrating ductal carcinoma of right female breast 12/02/2009    Qualifier: History of  By: [name] CMA [name]), June     Past Surgical History  Procedure Laterality Date  . Tonsillectomy and adenoidectomy    . Breast biopsy    . Breast lumpectomy    . Esophagogastroduodenoscopy  12/11/2009    Dr. Fuller Plan: hiatal hernia  . Colonoscopy  2009    Dr. Fuller Plan: normal  . Esophagogastroduodenoscopy N/A 01/29/2014    Procedure: ESOPHAGOGASTRODUODENOSCOPY (EGD);  Surgeon: [name], MD;  Location: [facility];  Service: Endoscopy;  Laterality: N/A;  245    Current medications ASA 81 daily ON HOLD Zofran 4 mg 4 times daily when necessary Tylenol 325 one by mouth twice a day Amlodipine 5 mg daily Milk of magnesia 30  ML's daily when necessary Depakote 250 twice daily; there Depakote was actually stopped yesterday apparently due to lethargy HOLD Tylenol when necessary Os-Cal 500+ D1 by mouth twice a day Rocephin 1 gram daily x 7days  Social history; the patient is a DO NOT RESUSCITATE. Her daughter is her spokesperson and works as the Microbiologist in the facility. There has not been a purely comfort care philosophy.  Review of systems; this is not possible from the patient secondary to advanced dementia O ever in talking to the nurses she has not lost any weight although she eats marginally she will drink including supplements supplement includes boost glucose control with all meals 3 times daily.  Her daughter reports up to 5 episodes of vomiting yesterday but none so far today   Physical examination Gen. the patient does not look acutely all but certainly chronically ill. HEENT what I can see her mucous membranes were moist. Respiratory clear entry bilaterally Cardiac heart sounds are tachycardia 2/6 pansystolic murmur at the PMI that radiates to the axilla Abdomen; she is not distended. Bowel sounds are present. She becomes very agitated with any attempted exam however given these limitations there was no tenderness no clear guarding or rebound especially in the right upper quadrant. Extremities; no edema and no evidence of a DVT Lymph; none palpable in the cervical clavicular or axillary areas GU; bladder is not clearly distended there is no CVA tenderness.  Impression/plan #1 vomiting. I am now wondering whether this represents acute cholecystitis. Her white count is come down. If this doesn't settle down by Monday she will need repeat ultrasound of the right upper quadrant. She has known cholelithiasis. #2 acute renal failure with hypernatremia. I'm going to continue the half normal saline over the weekend until her lab work is repeated on Monday #3 the fact that she is hypernatremic probably dictates that this is been going on longer than the recorded time frame. The patient with advanced dementia this all risks raises ethical issues.  I have discussed the plan with her family today who are in agreement. If this settles on its own, perhaps no further testing however she continues to vomit or shows abdominal pain fever then she will need an abdominal ultrasound.

## 2015-01-26 ENCOUNTER — Encounter (HOSPITAL_COMMUNITY)
Admission: RE | Admit: 2015-01-26 | Discharge: 2015-01-26 | Disposition: A | Discharge status: Home / Self Care | Payer: Medicare Other | Source: Skilled Nursing Facility | Attending: Internal Medicine | Admitting: Internal Medicine

## 2015-01-26 ENCOUNTER — Non-Acute Institutional Stay (SKILLED_NURSING_FACILITY): Payer: Medicare Other | Admitting: Internal Medicine

## 2015-01-26 ENCOUNTER — Encounter: Payer: Self-pay | Admitting: Internal Medicine

## 2015-01-26 DIAGNOSIS — F0391 Unspecified dementia with behavioral disturbance: Secondary | ICD-10-CM

## 2015-01-26 DIAGNOSIS — R8290 Unspecified abnormal findings in urine: Secondary | ICD-10-CM | POA: Diagnosis not present

## 2015-01-26 DIAGNOSIS — K922 Gastrointestinal hemorrhage, unspecified: Secondary | ICD-10-CM | POA: Diagnosis not present

## 2015-01-26 DIAGNOSIS — E870 Hyperosmolality and hypernatremia: Secondary | ICD-10-CM | POA: Diagnosis not present

## 2015-01-26 DIAGNOSIS — F03918 Unspecified dementia, unspecified severity, with other behavioral disturbance: Secondary | ICD-10-CM

## 2015-01-26 DIAGNOSIS — I10 Essential (primary) hypertension: Secondary | ICD-10-CM | POA: Diagnosis not present

## 2015-01-26 DIAGNOSIS — R112 Nausea with vomiting, unspecified: Secondary | ICD-10-CM

## 2015-01-26 DIAGNOSIS — F411 Generalized anxiety disorder: Secondary | ICD-10-CM | POA: Diagnosis not present

## 2015-01-26 LAB — CULTURE, BLOOD (SINGLE): Culture: NO GROWTH

## 2015-01-26 LAB — COMPREHENSIVE METABOLIC PANEL
ALT: 29 U/L (ref 14–54)
AST: 19 U/L (ref 15–41)
Albumin: 3.5 g/dL (ref 3.5–5.0)
Alkaline Phosphatase: 56 U/L (ref 38–126)
Anion gap: 7 (ref 5–15)
BILIRUBIN TOTAL: 0.5 mg/dL (ref 0.3–1.2)
BUN: 36 mg/dL — AB (ref 6–20)
CHLORIDE: 112 mmol/L — AB (ref 101–111)
CO2: 26 mmol/L (ref 22–32)
CREATININE: 1.66 mg/dL — AB (ref 0.44–1.00)
Calcium: 9 mg/dL (ref 8.9–10.3)
GFR calc Af Amer: 33 mL/min — ABNORMAL LOW (ref >60)
GFR calc non Af Amer: 29 mL/min — ABNORMAL LOW (ref >60)
Glucose, Bld: 126 mg/dL — ABNORMAL HIGH (ref 65–99)
Potassium: 3.8 mmol/L (ref 3.5–5.1)
Sodium: 145 mmol/L (ref 135–145)
Total Protein: 6.7 g/dL (ref 6.5–8.1)

## 2015-01-26 LAB — CBC WITH DIFFERENTIAL/PLATELET
BASOS ABS: 0 10*3/uL (ref 0.0–0.1)
Basophils Relative: 1 %
Eosinophils Absolute: 0.4 10*3/uL (ref 0.0–0.7)
Eosinophils Relative: 6 %
HEMATOCRIT: 42.9 % (ref 36.0–46.0)
Hemoglobin: 13.4 g/dL (ref 12.0–15.0)
LYMPHS PCT: 15 %
Lymphs Abs: 1.1 10*3/uL (ref 0.7–4.0)
MCH: 29.6 pg (ref 26.0–34.0)
MCHC: 31.2 g/dL (ref 30.0–36.0)
MCV: 94.7 fL (ref 78.0–100.0)
Monocytes Absolute: 0.6 10*3/uL (ref 0.1–1.0)
Monocytes Relative: 8 %
NEUTROS ABS: 5 10*3/uL (ref 1.7–7.7)
Neutrophils Relative %: 70 %
Platelets: 165 10*3/uL (ref 150–400)
RBC: 4.53 MIL/uL (ref 3.87–5.11)
RDW: 14 % (ref 11.5–15.5)
WBC: 7.1 10*3/uL (ref 4.0–10.5)

## 2015-01-26 NOTE — Progress Notes (Signed)
Patient ID: [name], female   DOB: 05-Jul-1935, 79 y.o.   MRN: [id number] Facility; [facility] Chief complaint; follow-up acute renal failure hypernatremia History; this is a patient that the I saw on 9/14 with increasing anorexia, listless behavior. She vomited 2 that was guaiac positive. Her physical exam suggested dehydration white count slightly elevated. She was given IV fluids and empiric antibiotics. Her urine and blood culture was negative for. She does have a known history of cholelithiasis and had slight elevation in her ALT and AST. Her dominant exam is very difficult to interpret secondary to agitation nevertheless the possibility that she had symptomatic gallbladder disease was entertained I am seeing her today in follow-up. Nursing reports that she is eating all her meal this morning. She seems to be back to normal behavior  BMP Latest Ref Rng 01/26/2015 01/22/2015 01/21/2015  Glucose 65 - 99 mg/dL 126(H) 154(H) 130(H)  BUN 6 - 20 mg/dL 36(H) 39(H) 51(H)  Creatinine 0.44 - 1.00 mg/dL 1.66(H) 1.93(H) 2.11(H)  Sodium 135 - 145 mmol/L 145 152(H) 156(H)  Potassium 3.5 - 5.1 mmol/L 3.8 4.1 4.4  Chloride 101 - 111 mmol/L 112(H) 113(H) 118(H)  CO2 22 - 32 mmol/L 26 27 29   Calcium 8.9 - 10.3 mg/dL 9.0 9.3 9.8   Comprehensive been a polyp panel from this morning shows normal liver function tests.  Review of systems; not possible from the patient however nursing report she ate most of her meal today which is unusual for her.  Physical examination; Gen. she is awake alert and combative for. This is usual for her.02 sats 95% RR 20 p-82 Respiratory clear entry bilaterally Cardiac heart sounds are normal she is intuit a 6 pansystolic murmur that radiates to the axilla she appears to be euvolemic Abdomen very difficult to examine secondary to her behavior however there is no reproducible tenderness in the right upper quadrant. GU again very difficult to examine no clear  suprapubic or costovertebral angle tenderness Extremities; no edema is noted no evidence of a DVT Skin no worrisome pressure areas are noted Mental status; she appears to be back to normal.  Impression/plan #1 acute on chronic renal failure. It would appear that she has a baseline creatinine of 1.4 #2 dehydration. Her BUN creatinine and sodium are all corrected her IV fluid can stop #3 guaiac positive vomitus. I would like to continue PPIs for probably the next 4-5 months. I discontinued her aspirin I'll resume this in about a month. Again continue the proton pump inhibitor. #4 dementia with behavioral disturbances. Valproic acid was discontinued secondary to lethargy before her illness declared itself. At some point this is probably going to need to be restarted

## 2015-01-26 NOTE — Progress Notes (Signed)
Patient ID: [name], female   DOB: Sep 04, 1935, 79 y.o.   MRN: [id number]  This encounter was created in error - please disregard.

## 2015-01-30 ENCOUNTER — Encounter (HOSPITAL_COMMUNITY)
Admission: AD | Admit: 2015-01-30 | Discharge: 2015-01-30 | Disposition: A | Discharge status: Home / Self Care | Payer: Medicare Other | Source: Skilled Nursing Facility | Attending: Internal Medicine | Admitting: Internal Medicine

## 2015-01-30 DIAGNOSIS — F411 Generalized anxiety disorder: Secondary | ICD-10-CM | POA: Diagnosis not present

## 2015-01-30 DIAGNOSIS — R8290 Unspecified abnormal findings in urine: Secondary | ICD-10-CM | POA: Diagnosis not present

## 2015-01-30 DIAGNOSIS — I10 Essential (primary) hypertension: Secondary | ICD-10-CM | POA: Diagnosis not present

## 2015-01-30 LAB — BASIC METABOLIC PANEL
Anion gap: 6 (ref 5–15)
BUN: 46 mg/dL — ABNORMAL HIGH (ref 6–20)
CALCIUM: 9.2 mg/dL (ref 8.9–10.3)
CO2: 29 mmol/L (ref 22–32)
CREATININE: 1.66 mg/dL — AB (ref 0.44–1.00)
Chloride: 106 mmol/L (ref 101–111)
GFR calc Af Amer: 33 mL/min — ABNORMAL LOW (ref >60)
GFR calc non Af Amer: 29 mL/min — ABNORMAL LOW (ref >60)
Glucose, Bld: 90 mg/dL (ref 65–99)
Potassium: 4 mmol/L (ref 3.5–5.1)
SODIUM: 141 mmol/L (ref 135–145)

## 2015-02-14 ENCOUNTER — Inpatient Hospital Stay (HOSPITAL_COMMUNITY)
Admit: 2015-02-14 | Discharge: 2015-02-14 | Disposition: A | Discharge status: Home / Self Care | Payer: Medicare Other | Attending: Internal Medicine | Admitting: Internal Medicine

## 2015-02-14 DIAGNOSIS — M79632 Pain in left forearm: Secondary | ICD-10-CM | POA: Diagnosis not present

## 2015-02-16 DIAGNOSIS — H40013 Open angle with borderline findings, low risk, bilateral: Secondary | ICD-10-CM | POA: Diagnosis not present

## 2015-02-16 DIAGNOSIS — H2513 Age-related nuclear cataract, bilateral: Secondary | ICD-10-CM | POA: Diagnosis not present

## 2015-05-20 ENCOUNTER — Encounter: Payer: Self-pay | Admitting: Internal Medicine

## 2015-05-20 ENCOUNTER — Non-Acute Institutional Stay (SKILLED_NURSING_FACILITY): Payer: Medicare Other | Admitting: Internal Medicine

## 2015-05-20 DIAGNOSIS — K219 Gastro-esophageal reflux disease without esophagitis: Secondary | ICD-10-CM

## 2015-05-20 DIAGNOSIS — I10 Essential (primary) hypertension: Secondary | ICD-10-CM | POA: Diagnosis not present

## 2015-05-20 DIAGNOSIS — F0391 Unspecified dementia with behavioral disturbance: Secondary | ICD-10-CM

## 2015-05-20 DIAGNOSIS — F03918 Unspecified dementia, unspecified severity, with other behavioral disturbance: Secondary | ICD-10-CM

## 2015-05-20 DIAGNOSIS — M810 Age-related osteoporosis without current pathological fracture: Secondary | ICD-10-CM

## 2015-05-20 NOTE — Progress Notes (Signed)
Patient ID: [name], female   DOB: 1936-01-02, 80 y.o.   MRN: [id number]          routine-  visit.   Level of care skilled.   Facility [facility] .   Chief complaint.   Management of chronic medical conditions including dementia-failure to thrive-hypertension-dysphagia with history of gastroparesis-history of breast cancer right-osteoporosis---    History of present illness  Patient is a 80 year old female has been a long-term resident at this facility she has significant dementia complicated with numerous medical issues including history of right breast cancer which is followed by oncology-she has periods where she does not do very well at all and then appears to rebound  Currently she appears stable   She does have a history of vomiting back in September this was guaiac-positive-she was assessed by Dr. Dellia Nims medication changes were made her aspirin was held-she was given half normal saline for hypernatremia this appears to have largely resolved   Her Depakote has been discontinued none  the less--r behaviors appear to be fairly stable this was done as a mood stabilizer    Patient does have a significant history of gastroparesis had been on Dexilant but this was DC'd secondary to family concerns this was causing some lip smacking behavior.  She previously been on Reglan but this was actually discontinued secondary to its side effect profile including tardive dyskinesia.  She does continue on Zofran---    Regards to other issues--history of breast cancer -her tamoxifen was discontinued a while back secondary to failure to thrive not doing well  She is doing better in this regard but family prefers that not be restarted .      She does take fluids well according to her daughter who is actually dietitian the facility- solid food intake is still a challenge   Her weight appears to be stable at 130   .   She also has a history of a right arm fracture she she is on calcium  supplementation-she does have a history of osteoporosis--also has a recent small foot fracture which appears to have resolved fairly unremarkablly- ll .   She has a history of hypertension recent blood pressures appears stable  most recently  134/62-142/78-she is on Norvasc   Her daughter today reports some redness of her eyes in I am following up on this.       Family medical social history  reviewed per history and physical on 03/20/2011 as well as numerous previous progress notes.    Medications have been reviewed per [facility] This includes   Norvasc 5 mg daily.  Aspirin 81 mg daily. Prilosec 40 mg by mouth daily MiraLAX daily.  Colace 50 mL daily.  Tylenol 325 mg twice a day.  Tylenol liquid 650 mg every 8 hours when necessary.  Zinc topical to buttocks when necessary.  Zofran 4 mg 4 times a day.  Os-Cal twice a day  .    Review of systems-very limited secondary to patient with a history of significant dementia--per nursing staff and her daughter she is at baseline  Her daughter  noted some redness at times of her eyes  e      Physical exam.   Temperature 97.3 pulse 80 respirations 18 blood pressure 134/62 weight is stable at 1:30    2   In general this is a somewhat frail elderly female in no distress s Skin is warm and dry    Oropharynx -mucous membranes are   moist-  Eyes sclera and conjunctiva are largely clear-- slight erythema this appears to be more of a dry eye irritated eye presentation-- there is no exudate visual acuity appears grossly intact --   Chest is clear to auscultation without any rhonchi rales or wheezes no labored breathing. Respiratory effort quite poor however .   Heart is regular rate and rhythm without murmur gallop or rub- she does not really have significant lower extremity edema   Abdomen --soft nontender with positive bowel sounds     .   Musculoskeletal moves extremities x4 this appears to be baseline he did not note any  deformities other than arthritic changes.   Neurologic appears to be grossly intact no lateralizing findings cranial nerves appear grossly intact speech is clear although quite confused-I  .   Psych she is oriented to self only   mildly-agitated with exam today which happens occasionally-    Labs  01/30/2016.  Sodium 141 potassium 4.0 BUN 46 creatinine 1.66.  WBC 7.1 hemoglobin 13.4 platelets 165.  01/26/2015.  Liver function tests within normal limits  08/22/2014.  Depakote level less than 10.  Liver function tests within normal limits I note albumen was 3.6.  WBC 6.5 hemoglobin 12.7 platelets 219.  06/20/2014.  Sodium 141 potassium 4.1 BUN 37 creatinine 1.59   06/20/2014.  Sodium 141 potassium 4.1 BUN 37 creatinine 1.59.  WBC 5.5 hemoglobin 12.6 platelets 228.     05/06/2014.  WBC 10.7 hemoglobin 11.3 platelets 236.  Sodium 139 potassium 4.1 BUN 40 creatinine 1.65.    01/09/2014.  TSH-0.805.   .   01/09/2014.    WBC 12.5 hemoglobin 15.1 platelets 241.  Sodium 144 potassium 3.9 BUN 49 creatinine 1.7 to-liver function tests within normal limits albumin 3.7.    Assessment and plan.   #1-failure to thrive-her weight appears to have stabilized recently although apparently she is not eating much solid food-she is on supplements a---r staff and daughter have been encouraging by mouth intake again weight appears to be stable      #2-history of dementia with behaviors--as noted this is significant but relatively baseline her medications have been minimized -she is no longer on Depakote-.    #3 history of  gastroparesis she continues on Zofran --she iwasalso on Dexilant-- But this was discontinued secondary to concerns of daughter about it causing lipsmacking behavior- Again she did have some vomiting episodes back in September the same have not reoccurred-she also did receive IV fluids for hypernatremia  .   #4-history of osteoporosis with history of  fractures she is on calcium supplementation .   #5-history of hypertension this appears to be stable she is on Norvasc .   #6 as history constipation she is on MiraLax appears to be stable as well    #7 history of renal insufficiency-most recent creatinine appears to be baseline--1.66-- her daughter has requested minimal lab draws-and will prefer no lab draws at this time   #8-history of breast cancer-again medications have been minimized secondary to patient comfort and family wishes she no longer is on tamoxifen  #9 conjunctivitis this appears to be more of an irritated r eye presentation will treat conservatively with artificial tears for now and monitor     [phone]

## 2015-05-26 ENCOUNTER — Encounter: Payer: Self-pay | Admitting: Internal Medicine

## 2015-05-26 ENCOUNTER — Non-Acute Institutional Stay (SKILLED_NURSING_FACILITY): Payer: Medicare Other | Admitting: Internal Medicine

## 2015-05-26 DIAGNOSIS — R509 Fever, unspecified: Secondary | ICD-10-CM

## 2015-05-26 NOTE — Progress Notes (Signed)
Patient ID: [name], female   DOB: May 03, 1936, 80 y.o.   MRN: [id number]           acute-  visit.   Level of care skilled.   Facility [facility] .   Chief complaint.   Acute visit secondary to fever of unknown origin    History of present illness  Patient is a 80 year old female has been a long-term resident at this facility she has significant dementia complicated with numerous medical issues including history of right breast cancer which is followed by oncology-she has periods where she does not do very well at all and then appears to rebound  Currently she appears stable   She does have a history of vomiting back in September this was guaiac-positive-she was assessed by Dr. Dellia Nims medication changes were made her aspirin was held-she was given half normal saline for hypernatremia this appears to have largely resolved      Patient does have a significant history of gastroparesis had been on Dexilant but this was DC'd secondary to family concerns this was causing some lip smacking behavior.  She previously been on Reglan but this was actually discontinued secondary to its side effect profile including tardive dyskinesia.  She does continue on Zofran---   She  does   Do fairly well and then has acute episodes which are concerning with fairly significant failure to thrive and various other issues.  Apparently today she did spike a temperature of 99.4 axillary-her daughter felt she was a bit warm which prompted the evaluation.  Currently she is sitting in her chair comfortably-does not have any acute complaints although she is a very poor historian secondary to dementia and cannot really get a review of systems.  Apparently there's been any significant coughinghills.  Apparently she did eat breakfast this morning-     .          Family medical social history  reviewed per history and physical on 03/20/2011 as well as numerous previous progress notes.      Medications have been reviewed per [facility] This includes   Norvasc 5 mg daily.  Aspirin 81 mg daily. Prilosec 40 mg by mouth daily MiraLAX daily.  Colace 50 mL daily.  Tylenol 325 mg twice a day.  Tylenol liquid 650 mg every 8 hours when necessary.  Zinc topical to buttocks when necessary.  Zofran 4 mg 4 times a day.  Os-Cal twice a day  .    Review of systems-very limited secondary to patient with a history of significant dementia- Please see history of present illness-again no increased cough or chills has been noted apparently her appetite is fairly baseline  e      Physical exam.   Temperature 99.4 axillary pulse 60 respirations 20 blood pressure 132/67 O2 saturation 90% on room air   2   In general this is a somewhat frail elderly female in no distress Skin is warm and dry--possibly slightly increased warmth to touch she is not diaphoretic    Oropharynx -mucous membranes are   moist-   Eyes sclera and conjunctiva are largely clear--  --   Chest is clear to auscultation without any rhonchi rales or wheezes no labored breathing. Respiratory effort quite poor however .   Heart is regular rate and rhythm with somewhat distant heart sounds without murmur gallop or rub- she does not really have significant lower extremity edema   Abdomen --soft nontender with positive bowel sounds GU-really difficult to tell there is  tenderness here she does not like any attempted examination her abdominal or GU area     .   Musculoskeletal moves extremities x4 this appears to be baseline he did not note any deformities other than arthritic changes.   Neurologic appears to be grossly intact no lateralizing findings cranial nerves appear grossly intact speech is clear although quite confused-I  .   Psych she is oriented to self only   -minimally agitated with exam today her daughter is in the room which helps    Labs  01/30/2016.  Sodium 141 potassium 4.0 BUN 46 creatinine  1.66.  WBC 7.1 hemoglobin 13.4 platelets 165.  01/26/2015.  Liver function tests within normal limits  08/22/2014.  Depakote level less than 10.  Liver function tests within normal limits I note albumen was 3.6.  WBC 6.5 hemoglobin 12.7 platelets 219.  06/20/2014.  Sodium 141 potassium 4.1 BUN 37 creatinine 1.59   06/20/2014.  Sodium 141 potassium 4.1 BUN 37 creatinine 1.59.  WBC 5.5 hemoglobin 12.6 platelets 228.     05/06/2014.  WBC 10.7 hemoglobin 11.3 platelets 236.  Sodium 139 potassium 4.1 BUN 40 creatinine 1.65.    01/09/2014.  TSH-0.805.   .   01/09/2014.    WBC 12.5 hemoglobin 15.1 platelets 241.  Sodium 144 potassium 3.9 BUN 49 creatinine 1.7 to-liver function tests within normal limits albumin 3.7.    Assessment and plan.    Fever of unknown origin-patient does not certainly give a septic presentation at this time-she does have a history though of significant decline at times-this was discussed with her daughter in the room-will start an empiric antibiotic-broad-spectrum Levaquin 500 mg todayand 2 50 mg for 6 additional days-also will obtain a urinalysis and culture.  At this point will not pursue a chest x-ray or labs per discussion with daughter secondary to wishes for conservative care if at all possible.  Will monitor her status with vital signs pulse ox every shift-certainly if she declines will be more aggressive with the labs I suspect as well as further other diagnostic studies.  This was discussed with her daughter at bedside who is in agreement          [phone]

## 2015-05-28 ENCOUNTER — Encounter (HOSPITAL_COMMUNITY)
Admission: AD | Admit: 2015-05-28 | Discharge: 2015-05-28 | Disposition: A | Discharge status: Home / Self Care | Payer: Medicare Other | Source: Skilled Nursing Facility | Attending: Internal Medicine | Admitting: Internal Medicine

## 2015-05-28 DIAGNOSIS — Z87440 Personal history of urinary (tract) infections: Secondary | ICD-10-CM | POA: Diagnosis not present

## 2015-05-28 LAB — URINE MICROSCOPIC-ADD ON

## 2015-05-28 LAB — URINALYSIS, ROUTINE W REFLEX MICROSCOPIC
Bilirubin Urine: NEGATIVE
Glucose, UA: NEGATIVE mg/dL
Hgb urine dipstick: NEGATIVE
Ketones, ur: NEGATIVE mg/dL
NITRITE: NEGATIVE
PH: 7 (ref 5.0–8.0)
Protein, ur: NEGATIVE mg/dL

## 2015-05-30 LAB — URINE CULTURE: Culture: 60000

## 2015-06-10 ENCOUNTER — Non-Acute Institutional Stay (SKILLED_NURSING_FACILITY): Payer: Medicare Other | Admitting: Internal Medicine

## 2015-06-10 DIAGNOSIS — R112 Nausea with vomiting, unspecified: Secondary | ICD-10-CM | POA: Diagnosis not present

## 2015-06-10 DIAGNOSIS — K219 Gastro-esophageal reflux disease without esophagitis: Secondary | ICD-10-CM | POA: Diagnosis not present

## 2015-06-10 NOTE — Progress Notes (Signed)
Patient ID: [name], female   DOB: 30-Jul-1935, 80 y.o.   MRN: [id number]            acute-  visit.   Level of care skilled.   Facility [facility] .   Chief complaint.   Acute visit secondary to vomiting episode    History of present illness  Patient is a 80 year old female has been a long-term resident at this facility she has significant dementia complicated with numerous medical issues including history of right breast cancer which is followed by oncology-she has periods where she does not do very well at all and then appears to rebound  Currently she appears stable   She does have a history of vomiting back in September this was guaiac-positive-she was assessed by Dr. Dellia Nims medication changes were made her aspirin was held-she was given half normal saline for hypernatremia this appears to have largely resolved      Patient does have a significant history of gastroparesis had been on Dexilant but this was DC'd secondary to family concerns this was causing some lip smacking behavior.  She previously been on Reglan but this was actually discontinued secondary to its side effect profile including tardive dyskinesia.  She does continue on Zofran--- 4 mg 4 times a day she is also on Prilosec.  Apparently earlier this evening she did have a single episode of vomiting-when I assessed her in her room the vomiting had resolved she said her stomach hurt a little bit although she is a poor historian vital signs appear to be stable she was not in any distress physical exam was fairly benign.  According to her nurse she does have vomiting occasionally when she eats a bit too fast or possibly a bit too much --and usually these are self resolving          .          Family medical social history  reviewed per history and physical on 03/20/2011     Medications have been reviewed per [facility] This includes   Norvasc 5 mg daily.  Aspirin 81 mg daily. Prilosec 40 mg by mouth  daily MiraLAX daily.  Colace 50 mL daily.  Tylenol 325 mg twice a day.  Tylenol liquid 650 mg every 8 hours when necessary.  Zinc topical to buttocks when necessary.  Zofran 4 mg 4 times a day.  Os-Cal twice a day  .    Review of systems-very limited secondary to patient with a history of significant dementia- Please see history of present illness-   e      Physical exam.    She is afebrile pulse of 70 respirations of 18   2   In general this is a somewhat frail elderly female in no distress Skin is warm and dry--     Oropharynx -mucous membranes are   moist-   Eyes sclera and conjunctiva are largely clear--  --   Chest is clear to auscultation without any rhonchi rales or wheezes no labored breathing. Respiratory effort quite poor however .   Heart is regular rate and rhythm with somewhat distant heart sounds without murmur gallop or rub- she does not really have significant lower extremity edema   Abdomen --soft  with positive bowel sounds  -really difficult to tell there is tenderness here she does not like any attempted examination her abdominal or GU area--but any grimacing appears to be more related to an invasive maneuver which is typical for her I do  not see this is a change from her baseline     .   Musculoskeletal moves extremities x4 this appears to be baseline he did not note any deformities other than arthritic changes.   Neurologic appears to be grossly intact no lateralizing findings cranial nerves appear grossly intact speech is clear although quite confused-I  .   Psych she is oriented to self only   -     Labs  01/30/2016.  Sodium 141 potassium 4.0 BUN 46 creatinine 1.66.  WBC 7.1 hemoglobin 13.4 platelets 165.  01/26/2015.  Liver function tests within normal limits  08/22/2014.  Depakote level less than 10.  Liver function tests within normal limits I note albumen was 3.6.  WBC 6.5 hemoglobin 12.7 platelets  219.  06/20/2014.  Sodium 141 potassium 4.1 BUN 37 creatinine 1.59   06/20/2014.  Sodium 141 potassium 4.1 BUN 37 creatinine 1.59.  WBC 5.5 hemoglobin 12.6 platelets 228.     05/06/2014.  WBC 10.7 hemoglobin 11.3 platelets 236.  Sodium 139 potassium 4.1 BUN 40 creatinine 1.65.    01/09/2014.  TSH-0.805.   .   01/09/2014.    WBC 12.5 hemoglobin 15.1 platelets 241.  Sodium 144 potassium 3.9 BUN 49 creatinine 1.7 to-liver function tests within normal limits albumin 3.7.    Assessment and plan.    Vomiting-this appears to be a single episode patient appears to be at her baseline somewhat anxious which is not unusual for her lung sounds are fairly benign although her respiratory effort is poor-at this point continue to monitor vital signs every shift-if this persists certainly will have to be more aggressive-at this point suspect shepossibly has eaten a bit too much or too fast       [id number]

## 2015-07-23 ENCOUNTER — Encounter (HOSPITAL_COMMUNITY)
Admission: RE | Admit: 2015-07-23 | Discharge: 2015-07-23 | Disposition: A | Discharge status: Home / Self Care | Payer: Medicare Other | Source: Skilled Nursing Facility | Attending: Internal Medicine | Admitting: Internal Medicine

## 2015-07-23 DIAGNOSIS — F319 Bipolar disorder, unspecified: Secondary | ICD-10-CM | POA: Diagnosis not present

## 2015-07-23 DIAGNOSIS — Z853 Personal history of malignant neoplasm of breast: Secondary | ICD-10-CM | POA: Insufficient documentation

## 2015-07-23 DIAGNOSIS — I10 Essential (primary) hypertension: Secondary | ICD-10-CM | POA: Diagnosis not present

## 2015-07-23 DIAGNOSIS — R269 Unspecified abnormalities of gait and mobility: Secondary | ICD-10-CM | POA: Insufficient documentation

## 2015-07-23 DIAGNOSIS — K3184 Gastroparesis: Secondary | ICD-10-CM | POA: Diagnosis not present

## 2015-07-23 LAB — BASIC METABOLIC PANEL
Anion gap: 9 (ref 5–15)
BUN: 48 mg/dL — AB (ref 6–20)
CO2: 30 mmol/L (ref 22–32)
Calcium: 9.8 mg/dL (ref 8.9–10.3)
Chloride: 105 mmol/L (ref 101–111)
Creatinine, Ser: 1.67 mg/dL — ABNORMAL HIGH (ref 0.44–1.00)
GFR calc Af Amer: 33 mL/min — ABNORMAL LOW (ref >60)
GFR, EST NON AFRICAN AMERICAN: 28 mL/min — AB (ref >60)
GLUCOSE: 97 mg/dL (ref 65–99)
POTASSIUM: 4 mmol/L (ref 3.5–5.1)
Sodium: 144 mmol/L (ref 135–145)

## 2015-07-23 LAB — MAGNESIUM: Magnesium: 2.5 mg/dL — ABNORMAL HIGH (ref 1.7–2.4)

## 2015-08-07 ENCOUNTER — Encounter (HOSPITAL_COMMUNITY)
Admission: RE | Admit: 2015-08-07 | Discharge: 2015-08-07 | Disposition: A | Discharge status: Home / Self Care | Payer: Medicare Other | Source: Skilled Nursing Facility | Attending: Internal Medicine | Admitting: Internal Medicine

## 2015-08-07 DIAGNOSIS — R269 Unspecified abnormalities of gait and mobility: Secondary | ICD-10-CM | POA: Diagnosis not present

## 2015-08-07 DIAGNOSIS — F319 Bipolar disorder, unspecified: Secondary | ICD-10-CM | POA: Diagnosis not present

## 2015-08-07 DIAGNOSIS — Z853 Personal history of malignant neoplasm of breast: Secondary | ICD-10-CM | POA: Diagnosis not present

## 2015-08-07 DIAGNOSIS — I10 Essential (primary) hypertension: Secondary | ICD-10-CM | POA: Diagnosis not present

## 2015-08-07 DIAGNOSIS — K3184 Gastroparesis: Secondary | ICD-10-CM | POA: Diagnosis not present

## 2015-08-07 LAB — BASIC METABOLIC PANEL
ANION GAP: 10 (ref 5–15)
BUN: 49 mg/dL — AB (ref 6–20)
BUN: 49 mg/dL — AB (ref 4–21)
CHLORIDE: 103 mmol/L (ref 101–111)
CO2: 29 mmol/L (ref 22–32)
CREATININE: 1.7 mg/dL — AB (ref <=1.1)
Calcium: 9.7 mg/dL (ref 8.9–10.3)
Creatinine, Ser: 1.67 mg/dL — ABNORMAL HIGH (ref 0.44–1.00)
GFR calc Af Amer: 33 mL/min — ABNORMAL LOW (ref >60)
GFR, EST NON AFRICAN AMERICAN: 28 mL/min — AB (ref >60)
GLUCOSE: 92 mg/dL (ref 65–99)
Glucose: 92 mg/dL
POTASSIUM: 4 mmol/L (ref 3.5–5.1)
Sodium: 142 mmol/L (ref 135–145)
Sodium: 142 mmol/L (ref 137–147)

## 2015-08-07 LAB — MAGNESIUM: Magnesium: 2.6 mg/dL — ABNORMAL HIGH (ref 1.7–2.4)

## 2015-08-28 ENCOUNTER — Non-Acute Institutional Stay (SKILLED_NURSING_FACILITY): Payer: Medicare Other | Admitting: Internal Medicine

## 2015-08-28 ENCOUNTER — Encounter: Payer: Self-pay | Admitting: Internal Medicine

## 2015-08-28 DIAGNOSIS — T148 Other injury of unspecified body region: Secondary | ICD-10-CM | POA: Diagnosis not present

## 2015-08-28 DIAGNOSIS — F0391 Unspecified dementia with behavioral disturbance: Secondary | ICD-10-CM | POA: Diagnosis not present

## 2015-08-28 DIAGNOSIS — I10 Essential (primary) hypertension: Secondary | ICD-10-CM

## 2015-08-28 DIAGNOSIS — F03918 Unspecified dementia, unspecified severity, with other behavioral disturbance: Secondary | ICD-10-CM

## 2015-08-28 DIAGNOSIS — K3184 Gastroparesis: Secondary | ICD-10-CM

## 2015-08-28 DIAGNOSIS — T148XXA Other injury of unspecified body region, initial encounter: Secondary | ICD-10-CM

## 2015-08-28 NOTE — Progress Notes (Signed)
Patient ID: [name], female   DOB: 19-Feb-1936, 80 y.o.   MRN: [id number]  Location:  [facility]   Place of Service:  SNF (31)   Cyndee Brightly, MD  Patient Care Team: Ricard Dillon, MD as PCP - General (Internal Medicine)  Extended Emergency Contact Information Primary Emergency Contact: [facility] Address: [address]          [place], [place] [address] [name] of [place] Phone: [phone] Work Phone: [phone] Mobile Phone: [phone] Relation: Daughter Secondary Emergency Contact: [name] Address: [address]          [place], [place] [address] [name] of [place] Phone: [phone] Mobile Phone: [phone] Relation: Relative   Goals of care: Advanced Directive information Advanced Directives 08/28/2015  Does patient have an advance directive? Yes  Type of Advance Directive Out of facility DNR (pink MOST or yellow form)  Does patient want to make changes to advanced directive? No - Patient declined  Copy of advanced directive(s) in chart? Yes  Would patient like information on creating an advanced directive? -     Chief Complaint  Patient presents with  . Acute Visit  Secondary to left arm abrasion-follow-up chronic medical conditions including dementia with behavior is gastroparesis hypertension  HPI:  Pt is a 80 y.o. female seen today for an acute visit for Abrasion on the left arm-also seen for medical management of gastroparesis-hypertension-dementia with behaviors.  Patient does have a history of cellulitis of her arms-I did look at the abrasion today on the left arm it does not appear cellulitic at this point but will have to be monitored.  Her other medical issues appear to be relatively stable she does have a significant history of dementia with behaviors but we have tried to minimize her medications secondary to concerns about side effects-she has done relatively well with supportive care.  She does  have a history of right breast cancer she has been followed by oncology currently on no medication again conservative follow-up in therapy desired secondary to patient's significant dementia.  In regards to hypertension she is on Norvasc 5 mg a day-blood pressure today somewhat elevated at 165/84 however she is agitated and it will spike at those times previous blood pressure 120/68 appears to be more relatively her baseline.  She does have a history of gastroparesis-has been on Reglan and excellent in the past but these have been DC'd secondary to concerns of tardive dyskinesia symptoms she does continue on Zofran 4 times a day.  She continues to have a spotty appetite.        Past Medical History  Diagnosis Date  . Dementia   . Gastroparalysis   . HTN (hypertension)   . Gallstones   . Reflux   . Cancer ([place])   . Osteoporosis 11/18/2011  . Infiltrating ductal carcinoma of right female breast ([place]) 12/02/2009    Qualifier: History of  By: [name] CMA [name]), June     Past Surgical History  Procedure Laterality Date  . Tonsillectomy and adenoidectomy    . Breast biopsy    . Breast lumpectomy    . Esophagogastroduodenoscopy  12/11/2009    Dr. Fuller Plan: hiatal hernia  . Colonoscopy  2009    Dr. Fuller Plan: normal  . Esophagogastroduodenoscopy N/A 01/29/2014    Procedure: ESOPHAGOGASTRODUODENOSCOPY (EGD);  Surgeon: [name], MD;  [facility];  Service: Endoscopy;  Laterality: N/A;  245    Allergies  Allergen Reactions  . Aricept Automatic Data  Hydrochloride] Nausea And Vomiting  . Lithium Nausea And Vomiting   Medications.  Norvasc 5 mg daily.  MiraLAX 17 g 8 ounces fluid daily.  Prilosec 40 mg daily.  Os-Cal with vitamin D 500 twice a day.  Tylenol 325 mg twice a day.  Tylenol 650 mg every 8 hours when necessary.  Zofran 4 mg 4 times a day and at bedtime   Review of Systems  Essentially unattainable secondary to dementia-per nursing appears to be  essentially at her baseline with a spotty appetite has sustained a small abrasion to her left arm she is not currently complaining of pain  Immunization History  Administered Date(s) Administered  . Influenza-Unspecified 02/13/2014   Pertinent  Health Maintenance Due  Topic Date Due  . PNA vac Low Risk Adult (1 of 2 - PCV13) 01/13/2003  . INFLUENZA VACCINE  12/08/2015  . DEXA SCAN  Completed   No flowsheet data found. Functional Status Survey:    There were no vitals filed for this visit. There is no weight on file to calculate BMI. Physical Exam   In general this is a somewhat frail elderly female in no distress somewhat agitated with exam.  Her skin is warm and dry left elbow area there is a small abrasion there is no surrounding erythema or drainage or acute tenderness to palpation will need to be monitored but does not appear cellulitic at this time.  Eyes pupils appear reactive to light visual acuity grossly intact.  Chest is clear to auscultation there is no labored breathing there is poor effort.  Heart sounds are distant per radial pulse regular rate and rhythm.  Her abdomen soft nontender positive bowel sounds.  Musculoskeletal does move all extremities 4 strength appears to be intact upper extremities as well as lower extremities she is able to stand without assistance but is quite weak and a fall risk.  Neurologic is grossly intact her speech is clear.  Psych again operable with significant dementia she will with prompting at times follow simple verbal commands  Labs reviewed:  Recent Labs  01/30/15 0720 07/23/15 0810 08/07/15 0745  NA 141 144 142  K 4.0 4.0 4.0  CL 106 105 103  CO2 29 30 29   GLUCOSE 90 97 92  BUN 46* 48* 49*  CREATININE 1.66* 1.67* 1.67*  CALCIUM 9.2 9.8 9.7  MG  --  2.5* 2.6*    Recent Labs  01/21/15 0740 01/22/15 0705 01/26/15 0700  AST 29 51* 19  ALT 36 73* 29  ALKPHOS 69 70 56  BILITOT 0.7 0.6 0.5  PROT 7.7 7.8 6.7    ALBUMIN 4.3 4.2 3.5    Recent Labs  01/21/15 0645 01/22/15 0705 01/23/15 0810 01/26/15 0700  WBC 13.6* 11.7* 8.5 7.1  NEUTROABS 11.3* 9.7*  --  5.0  HGB 14.9 14.6 13.2 13.4  HCT 48.7* 50.1* 42.4 42.9  MCV 96.2 98.4 94.4 94.7  PLT 225 223 172 165   Lab Results  Component Value Date   TSH 0.701 01/22/2015   Lab Results  Component Value Date   HGBA1C  05/17/2007    5.4 (NOTE)   The ADA recommends the following therapeutic goals for glycemic   control related to Hgb A1C measurement:   Goal of Therapy:   < 7.0% Hgb A1C   Action Suggested:  > 8.0% Hgb A1C   Ref:  Diabetes Care, 22, Suppl. 1, 1999   Lab Results  Component Value Date   CHOL  05/22/2007  175        ATP III CLASSIFICATION:  <200     mg/dL   Desirable  200-239  mg/dL   Borderline High  >=240    mg/dL   High   HDL 37* 05/22/2007   LDLCALC  05/22/2007    96        Total Cholesterol/HDL:CHD Risk Coronary Heart Disease Risk Table                     Men   Women  1/2 Average Risk   3.4   3.3   TRIG 208* 05/22/2007   CHOLHDL 4.7 05/22/2007    Significant Diagnostic Results in last 30 days:  No results found.  Assessment/Plan  #1 left elbow abrasion at this point will monitor at this point I do not see any sign of infection we'll treat with topical antibiotic twice a day.  #2 history of hypertension again at times systolics elevated I suspect this is due to agitation monitor at this point continue Norvasc 5 mg a day.  #3 history gastroparesis again conservative here secondary concerns about medication side effects she is on Zofran 4 times a day this is been relatively stable.  #4-history of right breast cancer again she has been followed by oncology no longer on tamoxifen secondary to wishes for conservative treatment and follow-up.  [id number]

## 2015-09-05 NOTE — Progress Notes (Signed)
Patient ID: [name], female   DOB: [birth year], 80 y.o.   MRN: [id number]

## 2015-10-06 ENCOUNTER — Non-Acute Institutional Stay (SKILLED_NURSING_FACILITY): Payer: Medicare Other | Admitting: Internal Medicine

## 2015-10-06 ENCOUNTER — Encounter: Payer: Self-pay | Admitting: Internal Medicine

## 2015-10-06 DIAGNOSIS — I10 Essential (primary) hypertension: Secondary | ICD-10-CM

## 2015-10-06 DIAGNOSIS — K3184 Gastroparesis: Secondary | ICD-10-CM

## 2015-10-06 DIAGNOSIS — N289 Disorder of kidney and ureter, unspecified: Secondary | ICD-10-CM

## 2015-10-06 DIAGNOSIS — F03918 Unspecified dementia, unspecified severity, with other behavioral disturbance: Secondary | ICD-10-CM

## 2015-10-06 DIAGNOSIS — F0391 Unspecified dementia with behavioral disturbance: Secondary | ICD-10-CM

## 2015-10-06 NOTE — Progress Notes (Signed)
Location:  [place] Room Number: 110/W Place of Service:  SNF [phone]) Provider:  Granville Lewis  Cyndee Brightly, MD  Patient Care Team: Ricard Dillon, MD as PCP - General (Internal Medicine)  Extended Emergency Contact Information Primary Emergency Contact: [facility] Address: [address]          [place], [place] [address] [name] of [place] Phone: [phone] Work Phone: [phone] Mobile Phone: [phone] Relation: Daughter Secondary Emergency Contact: [name] Address: [place]          [place], [place] [address] [name] of [place] Phone: [phone] Mobile Phone: [phone] Relation: Relative  Code Status:  DNR Goals of care: Advanced Directive information Advanced Directives 10/06/2015  Does patient have an advance directive? Yes  Type of Advance Directive Out of facility DNR (pink MOST or yellow form)  Does patient want to make changes to advanced directive? No - Patient declined  Copy of advanced directive(s) in chart? Yes     Chief Complaint  Patient presents with  . Medical Management of Chronic Issues    Routine visit  Medical management of dementia with behavior is gastroparesis hypertension  HPI:  Pt is a 80 y.o. female seen today for follow-up of chronic medical conditions as noted above.  She has had a period of stability-family has expressed desire for conservative follow-up of her issue secondary to her significant dementia with minimalization of going labs   She has had a significant history of gastroparesis with this appears relatively well controlled now on the Zofran 4 times a day she is also on a proton pump inhibitor-as well as MiraLAX. And docusate sodium  Blood pressure appears to be variable IC readings ranging from 176/74 down to 107/42 I do not see consistent elevations of weeks on these blood pressure elevations may be due to agitation which patient tends to have with invasive  maneuvers.  Pulse I got on exam was in the 60s today.  In regards to her dementia she is not currently on any medication except supportive care she appears to be doing relatively well weight appears to be relatively stable in the mid 130s appears her appetite is spotty sometimes eating up to 100% other times apparently not eating well.  She does have a history of right breast cancer has been followed by oncology earlier no medication because of conservative desires.         Past Medical History  Diagnosis Date  . Dementia   . Gastroparalysis   . HTN (hypertension)   . Gallstones   . Reflux   . Cancer ([place])   . Osteoporosis 11/18/2011  . Infiltrating ductal carcinoma of right female breast ([place]) 12/02/2009    Qualifier: History of  By: [name] CMA [name]), June     Past Surgical History  Procedure Laterality Date  . Tonsillectomy and adenoidectomy    . Breast biopsy    . Breast lumpectomy    . Esophagogastroduodenoscopy  12/11/2009    Dr. Fuller Plan: hiatal hernia  . Colonoscopy  2009    Dr. Fuller Plan: normal  . Esophagogastroduodenoscopy N/A 01/29/2014    Procedure: ESOPHAGOGASTRODUODENOSCOPY (EGD);  Surgeon: [name], MD;  [facility];  Service: Endoscopy;  Laterality: N/A;  245    Allergies  Allergen Reactions  . Aricept [Donepezil Hydrochloride] Nausea And Vomiting  . Lithium Nausea And Vomiting     Current Outpatient Prescriptions on File Prior to Visit  Medication Sig Dispense Refill  . acetaminophen (  TYLENOL) 160 MG/5ML liquid 20 cc every 4 hours as needed for pain or temp over 101F    . acetaminophen (TYLENOL) 325 MG tablet 1 by mouth twice daily, 2 by mouth every 6 hours as needed for pain/fever    . amLODipine (NORVASC) 5 MG tablet Take 5 mg by mouth daily.    . calcium-vitamin D (OSCAL WITH D) 500-200 MG-UNIT per tablet Take 1 tablet by mouth 2 (two) times daily.    [name] docusate (COLACE) 50 MG/5ML liquid Take 100 mg by mouth daily.    [name] omeprazole  (PRILOSEC) 40 MG capsule Take 40 mg by mouth daily.    . polyethylene glycol (MIRALAX / GLYCOLAX) packet Take 17 g by mouth daily.      [name] ZINC OXIDE, TOPICAL, 10 % CREA Apply sparingly to buttocks d/t redness     No current facility-administered medications on file prior to visit.     Review of Systems  Unattainable secondary to dementia nursing staff does not report any acute issues  Immunization History  Administered Date(s) Administered  . Influenza-Unspecified 02/13/2014   Pertinent  Health Maintenance Due  Topic Date Due  . PNA vac Low Risk Adult (1 of 2 - PCV13) 10/05/2016 (Originally 01/13/2003)  . INFLUENZA VACCINE  12/08/2015  . DEXA SCAN  Completed   No flowsheet data found. Functional Status Survey:   She is afebrile pulse of 62 respirations 18 blood pressure variable as noted above most recently 176/74-107/42 in this range weight appears to be stable at 136   Physical Exam   In general this is a frail elderly female in no distress sitting comfortably in her wheelchair she is pleasant and somewhat cooperative today.  Skin is warm and dry I do not really see signs of cellulitis on her upper extremities although she will develop this at times.  Eyes pupils appear reactive to light visual acuity appears grossly intact sclera and conjunctiva are clear.  Chest is clear to auscultation there is no labored breathing there is poor respiratory effort.  Heart limited exam regular rate and rhythm what I was able to auscultate appear to be in the 60s.  Abdomen soft nontender positive bowel sounds protuberant.  Muscle skeletal is able to move all extremities 4 strength appears to be intact all 4 extremities she is able to stand although certainly this is discouraged secondary to fall risk.  Neurologic is grossly intact her speech is clear. Psych again she is somewhat cooperative with exam today continues with severe dementia she is pleasant smiling.     Labs  reviewed:    08/07/2015 sodium 142 potassium 4 BUN 49 creatinine 1.67.  01/20/2015.  Liver function tests within normal limits.  WBC 7.1 hemoglobin 13.4 platelets 165  Recent Labs  01/30/15 0720 07/23/15 0810 08/07/15 0745  NA 141 144 142  K 4.0 4.0 4.0  CL 106 105 103  CO2 29 30 29   GLUCOSE 90 97 92  BUN 46* 48* 49*  CREATININE 1.66* 1.67* 1.67*  CALCIUM 9.2 9.8 9.7  MG  --  2.5* 2.6*    Recent Labs  01/21/15 0740 01/22/15 0705 01/26/15 0700  AST 29 51* 19  ALT 36 73* 29  ALKPHOS 69 70 56  BILITOT 0.7 0.6 0.5  PROT 7.7 7.8 6.7  ALBUMIN 4.3 4.2 3.5    Recent Labs  01/21/15 0645 01/22/15 0705 01/23/15 0810 01/26/15 0700  WBC 13.6* 11.7* 8.5 7.1  NEUTROABS 11.3* 9.7*  --  5.0  HGB  14.9 14.6 13.2 13.4  HCT 48.7* 50.1* 42.4 42.9  MCV 96.2 98.4 94.4 94.7  PLT 225 223 172 165   Lab Results  Component Value Date   TSH 0.701 01/22/2015   Lab Results  Component Value Date   HGBA1C  05/17/2007    5.4 (NOTE)   The ADA recommends the following therapeutic goals for glycemic   control related to Hgb A1C measurement:   Goal of Therapy:   < 7.0% Hgb A1C   Action Suggested:  > 8.0% Hgb A1C   Ref:  Diabetes Care, 22, Suppl. 1, 1999   Lab Results  Component Value Date   CHOL  05/22/2007    175        ATP III CLASSIFICATION:  <200     mg/dL   Desirable  200-239  mg/dL   Borderline High  >=240    mg/dL   High   HDL 37* 05/22/2007   LDLCALC  05/22/2007    96        Total Cholesterol/HDL:CHD Risk Coronary Heart Disease Risk Table                     Men   Women  1/2 Average Risk   3.4   3.3   TRIG 208* 05/22/2007   CHOLHDL 4.7 05/22/2007    Significant Diagnostic Results in last 30 days:  No results found.  Assessment/Plan  #1 dementia with behaviors-this appears relatively stable with supportive care at this point continue to monitor she had times will have periods of agitation but usually eventually can be redirected.  #2 history of hypertension  have variable systolics I suspect this is agitation related at this point will monitor I do not see consistent elevations she is on Norvasc 5 mg a day.  #3 history gastroparesis at one point she been on Reglan but apparently developed some symptoms of tardive dyskinesia-this was discontinued she is on Zofran 4 times a day as well as a proton pump inhibitor is appears to be relatively stable.  #4 history of breast cancer again conservative follow-up desired per patient's significant dementia currently not on any agent.  #5 history renal insufficiency most recent creatinine 1.67 on 08/07/2015 this is relatively baseline with previous values again would not draw follow-up labs at this point secondary to family wishes to minimize lab draws  [place], [place], [place]

## 2015-11-26 ENCOUNTER — Encounter: Payer: Self-pay | Admitting: Internal Medicine

## 2015-11-26 ENCOUNTER — Non-Acute Institutional Stay (SKILLED_NURSING_FACILITY): Payer: Medicare Other | Admitting: Internal Medicine

## 2015-11-26 DIAGNOSIS — F3170 Bipolar disorder, currently in remission, most recent episode unspecified: Secondary | ICD-10-CM | POA: Diagnosis not present

## 2015-11-26 DIAGNOSIS — N289 Disorder of kidney and ureter, unspecified: Secondary | ICD-10-CM | POA: Diagnosis not present

## 2015-11-26 DIAGNOSIS — F0391 Unspecified dementia with behavioral disturbance: Secondary | ICD-10-CM | POA: Diagnosis not present

## 2015-11-26 DIAGNOSIS — I10 Essential (primary) hypertension: Secondary | ICD-10-CM | POA: Diagnosis not present

## 2015-11-26 DIAGNOSIS — F03918 Unspecified dementia, unspecified severity, with other behavioral disturbance: Secondary | ICD-10-CM

## 2015-11-26 NOTE — Progress Notes (Signed)
   [facility] Room: 110 DNR  Chief Complaint  Patient presents with  . Medical Management of Chronic Issues    Medical Management of Chronic Issues   Allergies  Allergen Reactions  . Aricept [Donepezil Hydrochloride] Nausea And Vomiting  . Exelon [Rivastigmine Tartrate]   . Lithium Nausea And Vomiting   This is a nursing facility follow up of chronic medical diagnoses   Interim medical record and care since last [place] visit was updated with review of diagnostic studies and change in clinical status since last visit were documented.  HPI:The patient has medical diagnoses of hypertension, GERD with dysphagia, gastroparesis , history of breast cancer, renal insufficiency, dementia, & bipolar disorder. In reference to her bipolar disorder and dementia she's been intolerant to Aricept, Exelon, and lithium. She's been diagnosed with adult failure to thrive syndrome. She has chronic constipation and is on MiraLAX and Colace chronically. Last labs on record were in March of the this year. Creatinine baseline was 1.67 and GFR 28. Magnesium was mildly elevated at 2.6. She's not only magnesium containing laxatives. TSH was 0.7 in September 2016.   Review of systems could not be completed due to her severe dementia.  Physical exam:  Pertinent or positive findings: She sits in the wheelchair mumbling and exhibiting lipsmacking behavior. She repeatedly purses her lips and tries to kiss anyone in the vicinity, stating "I love you,honey". Otherwise she stares blankly with intermittent resting exotropia of the right eye. She has multiple missing lower teeth. Heart is difficult to auscultate as she talks incessantly when examined. There appears to be respiratory variation to the rhythm. Breath sounds are decreased. She keeps her hands in a fist, wrapped in a towel. Posterior tibial pulses are decreased.  General appearance: thin but adequately nourished; no acute distress ,  increased work of breathing is present.   Lymphatic: No lymphadenopathy about the head, neck, axilla . Eyes: No conjunctival inflammation or lid edema is present. There is no scleral icterus. Ears:  External ear exam shows no significant lesions or deformities.   Nose:  External nasal examination shows no deformity or inflammation. Nasal mucosa are pink and moist without lesions ,exudates Oral exam: lips and gums are healthy appearing. Neck:  No thyromegaly, masses, tenderness noted.    Abdomen:Bowel sounds are normal. Abdomen is soft and nontender with no organomegaly, hernias,masses. GU: deferred . Extremities:  No cyanosis, clubbing,edema  Neurologic exam : Strength could not be tested as she cannot follow commands. Balance,Rhomberg,finger to nose testing could not be completed due to clinical state Skin: Warm & dry w/o tenting. No significant lesions or rash.    See summary under each active problem in the Problem List with associated updated therapeutic plan

## 2015-11-26 NOTE — Patient Instructions (Addendum)
New orders for Matrix entry. BMET  TSH

## 2015-11-26 NOTE — Assessment & Plan Note (Signed)
No changes in medication indicated as condition is essentially inactive at this time in reference to bipolar behavior

## 2015-11-26 NOTE — Assessment & Plan Note (Signed)
BMET Mg

## 2015-11-26 NOTE — Assessment & Plan Note (Signed)
Adding Namenda would not be expected to be of benefit

## 2015-11-26 NOTE — Assessment & Plan Note (Signed)
HTN controlled BMET

## 2015-11-27 ENCOUNTER — Encounter (HOSPITAL_COMMUNITY)
Admission: RE | Admit: 2015-11-27 | Discharge: 2015-11-27 | Disposition: A | Discharge status: Home / Self Care | Payer: Medicare Other | Source: Skilled Nursing Facility | Attending: Internal Medicine | Admitting: Internal Medicine

## 2015-11-27 DIAGNOSIS — I10 Essential (primary) hypertension: Secondary | ICD-10-CM | POA: Insufficient documentation

## 2015-11-27 DIAGNOSIS — K3184 Gastroparesis: Secondary | ICD-10-CM | POA: Diagnosis not present

## 2015-11-27 DIAGNOSIS — F319 Bipolar disorder, unspecified: Secondary | ICD-10-CM | POA: Insufficient documentation

## 2015-11-27 DIAGNOSIS — Z9181 History of falling: Secondary | ICD-10-CM | POA: Insufficient documentation

## 2015-11-27 DIAGNOSIS — R269 Unspecified abnormalities of gait and mobility: Secondary | ICD-10-CM | POA: Insufficient documentation

## 2015-11-27 DIAGNOSIS — Z853 Personal history of malignant neoplasm of breast: Secondary | ICD-10-CM | POA: Insufficient documentation

## 2015-11-27 LAB — BASIC METABOLIC PANEL
ANION GAP: 9 (ref 5–15)
BUN: 39 mg/dL — ABNORMAL HIGH (ref 6–20)
CALCIUM: 9.4 mg/dL (ref 8.9–10.3)
CO2: 28 mmol/L (ref 22–32)
Chloride: 104 mmol/L (ref 101–111)
Creatinine, Ser: 1.86 mg/dL — ABNORMAL HIGH (ref 0.44–1.00)
GFR, EST AFRICAN AMERICAN: 29 mL/min — AB (ref >60)
GFR, EST NON AFRICAN AMERICAN: 25 mL/min — AB (ref >60)
Glucose, Bld: 83 mg/dL (ref 65–99)
POTASSIUM: 3.6 mmol/L (ref 3.5–5.1)
Sodium: 141 mmol/L (ref 135–145)

## 2015-11-27 LAB — MAGNESIUM: Magnesium: 2.4 mg/dL (ref 1.7–2.4)

## 2015-11-27 LAB — TSH: TSH: 0.723 u[IU]/mL (ref 0.350–4.500)

## 2015-12-01 DIAGNOSIS — R279 Unspecified lack of coordination: Secondary | ICD-10-CM | POA: Diagnosis not present

## 2015-12-01 DIAGNOSIS — F0391 Unspecified dementia with behavioral disturbance: Secondary | ICD-10-CM | POA: Diagnosis not present

## 2015-12-01 DIAGNOSIS — Z9181 History of falling: Secondary | ICD-10-CM | POA: Diagnosis not present

## 2015-12-01 DIAGNOSIS — R262 Difficulty in walking, not elsewhere classified: Secondary | ICD-10-CM | POA: Diagnosis not present

## 2015-12-02 DIAGNOSIS — R279 Unspecified lack of coordination: Secondary | ICD-10-CM | POA: Diagnosis not present

## 2015-12-02 DIAGNOSIS — F0391 Unspecified dementia with behavioral disturbance: Secondary | ICD-10-CM | POA: Diagnosis not present

## 2015-12-02 DIAGNOSIS — R262 Difficulty in walking, not elsewhere classified: Secondary | ICD-10-CM | POA: Diagnosis not present

## 2015-12-02 DIAGNOSIS — Z9181 History of falling: Secondary | ICD-10-CM | POA: Diagnosis not present

## 2015-12-04 DIAGNOSIS — R262 Difficulty in walking, not elsewhere classified: Secondary | ICD-10-CM | POA: Diagnosis not present

## 2015-12-04 DIAGNOSIS — Z9181 History of falling: Secondary | ICD-10-CM | POA: Diagnosis not present

## 2015-12-04 DIAGNOSIS — R279 Unspecified lack of coordination: Secondary | ICD-10-CM | POA: Diagnosis not present

## 2015-12-04 DIAGNOSIS — F0391 Unspecified dementia with behavioral disturbance: Secondary | ICD-10-CM | POA: Diagnosis not present

## 2015-12-08 DIAGNOSIS — Z9181 History of falling: Secondary | ICD-10-CM | POA: Diagnosis not present

## 2015-12-08 DIAGNOSIS — R279 Unspecified lack of coordination: Secondary | ICD-10-CM | POA: Diagnosis not present

## 2015-12-08 DIAGNOSIS — R262 Difficulty in walking, not elsewhere classified: Secondary | ICD-10-CM | POA: Diagnosis not present

## 2015-12-08 DIAGNOSIS — F0391 Unspecified dementia with behavioral disturbance: Secondary | ICD-10-CM | POA: Diagnosis not present

## 2015-12-09 DIAGNOSIS — F0391 Unspecified dementia with behavioral disturbance: Secondary | ICD-10-CM | POA: Diagnosis not present

## 2015-12-09 DIAGNOSIS — Z9181 History of falling: Secondary | ICD-10-CM | POA: Diagnosis not present

## 2015-12-09 DIAGNOSIS — R262 Difficulty in walking, not elsewhere classified: Secondary | ICD-10-CM | POA: Diagnosis not present

## 2015-12-09 DIAGNOSIS — R279 Unspecified lack of coordination: Secondary | ICD-10-CM | POA: Diagnosis not present

## 2015-12-11 DIAGNOSIS — Z9181 History of falling: Secondary | ICD-10-CM | POA: Diagnosis not present

## 2015-12-11 DIAGNOSIS — F0391 Unspecified dementia with behavioral disturbance: Secondary | ICD-10-CM | POA: Diagnosis not present

## 2015-12-11 DIAGNOSIS — R262 Difficulty in walking, not elsewhere classified: Secondary | ICD-10-CM | POA: Diagnosis not present

## 2015-12-11 DIAGNOSIS — R279 Unspecified lack of coordination: Secondary | ICD-10-CM | POA: Diagnosis not present

## 2015-12-29 ENCOUNTER — Encounter (HOSPITAL_COMMUNITY)
Admission: RE | Admit: 2015-12-29 | Discharge: 2015-12-29 | Disposition: A | Discharge status: Home / Self Care | Payer: Medicare Other | Source: Skilled Nursing Facility | Attending: Internal Medicine | Admitting: Internal Medicine

## 2015-12-29 DIAGNOSIS — Z9181 History of falling: Secondary | ICD-10-CM | POA: Diagnosis not present

## 2015-12-29 DIAGNOSIS — K3184 Gastroparesis: Secondary | ICD-10-CM | POA: Insufficient documentation

## 2015-12-29 DIAGNOSIS — I10 Essential (primary) hypertension: Secondary | ICD-10-CM | POA: Insufficient documentation

## 2015-12-29 DIAGNOSIS — R269 Unspecified abnormalities of gait and mobility: Secondary | ICD-10-CM | POA: Diagnosis not present

## 2015-12-29 DIAGNOSIS — Z853 Personal history of malignant neoplasm of breast: Secondary | ICD-10-CM | POA: Insufficient documentation

## 2015-12-29 LAB — BASIC METABOLIC PANEL
Anion gap: 10 (ref 5–15)
BUN: 35 mg/dL — AB (ref 6–20)
CHLORIDE: 106 mmol/L (ref 101–111)
CO2: 25 mmol/L (ref 22–32)
CREATININE: 1.9 mg/dL — AB (ref 0.44–1.00)
Calcium: 9.6 mg/dL (ref 8.9–10.3)
GFR calc Af Amer: 28 mL/min — ABNORMAL LOW (ref >60)
GFR calc non Af Amer: 24 mL/min — ABNORMAL LOW (ref >60)
GLUCOSE: 71 mg/dL (ref 65–99)
POTASSIUM: 4.1 mmol/L (ref 3.5–5.1)
SODIUM: 141 mmol/L (ref 135–145)

## 2016-02-02 ENCOUNTER — Non-Acute Institutional Stay (SKILLED_NURSING_FACILITY): Payer: Medicare Other | Admitting: Internal Medicine

## 2016-02-02 ENCOUNTER — Encounter: Payer: Self-pay | Admitting: Internal Medicine

## 2016-02-02 DIAGNOSIS — C50911 Malignant neoplasm of unspecified site of right female breast: Secondary | ICD-10-CM | POA: Diagnosis not present

## 2016-02-02 DIAGNOSIS — I10 Essential (primary) hypertension: Secondary | ICD-10-CM | POA: Diagnosis not present

## 2016-02-02 DIAGNOSIS — K219 Gastro-esophageal reflux disease without esophagitis: Secondary | ICD-10-CM

## 2016-02-02 DIAGNOSIS — N184 Chronic kidney disease, stage 4 (severe): Secondary | ICD-10-CM

## 2016-02-02 DIAGNOSIS — M810 Age-related osteoporosis without current pathological fracture: Secondary | ICD-10-CM | POA: Diagnosis not present

## 2016-02-02 NOTE — Progress Notes (Signed)
Location:   [place] Room Number: 110/W Place of Service:  SNF (31) Provider:  Klea Nall,Matraca Hunkins  No primary care provider on file.  Patient Care Team: [name], MD as Consulting Physician [facility])  Extended Emergency Contact Information Primary Emergency Contact: [facility] Address: [address]          [place], [place] [address] [name] of [place] Phone: [phone] Work Phone: [phone] Mobile Phone: [phone] Relation: Daughter Secondary Emergency Contact: [name] Address: [address]          [place], [place] [address] [name] of [place] Phone: [phone] Mobile Phone: [phone] Relation: Relative  Code Status:  DNR Goals of care: Advanced Directive information Advanced Directives 02/02/2016  Does patient have an advance directive? Yes  Type of Advance Directive Out of facility DNR (pink MOST or yellow form)  Does patient want to make changes to advanced directive? No - Patient declined  Copy of advanced directive(s) in chart? Yes  Would patient like information on creating an advanced directive? -  Pre-existing out of facility DNR order (yellow form or pink MOST form) -     Chief Complaint  Patient presents with  . Medical Management of Chronic Issues    Routine Visit    HPI:  Pt is a 80 y.o. female seen today for medical management of chronic diseases. Patient has history of  Hypertension, Chronic renal disease, Breast Cancer S/P Limited Mastectomy and Dementia with Behavior issues. Per Family she needs to get limited interventions due to her dementia. Patient Unable to give me any history. It seems she has been very stable since last routine visit.   Past Medical History:  Diagnosis Date  . Cancer ([place])   . Dementia   . Gallstones   . Gastroparalysis   . HTN (hypertension)   . Infiltrating ductal carcinoma of right female breast ([place]) 12/02/2009   Qualifier: History of  By: [name] CMA  [name]), June    . Osteoporosis 11/18/2011  . Reflux    Past Surgical History:  Procedure Laterality Date  . BREAST BIOPSY Had Invasive Ductal carcinoma Status Post Limited Mastectomy in July 2012    . BREAST LUMPECTOMY    . COLONOSCOPY  2009   Dr. Fuller Plan: normal  . ESOPHAGOGASTRODUODENOSCOPY  12/11/2009   Dr. Fuller Plan: hiatal hernia  . ESOPHAGOGASTRODUODENOSCOPY N/A 01/29/2014   Procedure: ESOPHAGOGASTRODUODENOSCOPY (EGD);  Surgeon: [name], MD;  [facility];  Service: Endoscopy;  Laterality: N/A;  245  . TONSILLECTOMY AND ADENOIDECTOMY      Allergies  Allergen Reactions  . Aricept [Donepezil Hydrochloride] Nausea And Vomiting  . Exelon [Rivastigmine Tartrate]   . Lithium Nausea And Vomiting    Current Outpatient Prescriptions on File Prior to Visit  Medication Sig Dispense Refill  . acetaminophen (TYLENOL) 325 MG tablet Take one tablet by mouth twice daily for pain    . amLODipine (NORVASC) 5 MG tablet Take 5 mg by mouth daily.    . calcium-vitamin D (OSCAL WITH D) 500-200 MG-UNIT per tablet Take 1 tablet by mouth 2 (two) times daily.    [name] docusate (COLACE) 50 MG/5ML liquid Take 100 mg by mouth daily.    . hydrocortisone cream 1 % Apply cream twice a day prn to left elbow    . omeprazole (PRILOSEC) 40 MG capsule Take 40 mg by mouth daily.    . ondansetron (ZOFRAN-ODT) 4 MG disintegrating tablet Dissolve 1 tablet in mouth 4 times daily before meals and  at bedtime for nausea    . polyethylene glycol (MIRALAX / GLYCOLAX) packet Take 17 g by mouth daily.      [name] ZINC OXIDE, TOPICAL, 10 % CREA Apply sparingly to buttocks d/t redness     No current facility-administered medications on file prior to visit.      Review of Systems  Unable to perform ROS: Dementia    Immunization History  Administered Date(s) Administered  . Influenza-Unspecified 02/13/2014   Pertinent  Health Maintenance Due  Topic Date Due  . INFLUENZA VACCINE  04/08/2016 (Originally 12/08/2015)    . PNA vac Low Risk Adult (1 of 2 - PCV13) 10/05/2016 (Originally 01/13/2003)  . DEXA SCAN  Completed   No flowsheet data found. Functional Status Survey:    Vitals:   02/02/16 1059  BP: 131/69  Pulse: 62  Resp: 20  Temp: 98 F (36.7 C)  TempSrc: Oral  SpO2: 100%  Weight: 139 lb 6.4 oz (63.2 kg)   Body mass index is 21.83 kg/m. Physical Exam  Constitutional: She appears well-developed and well-nourished.  HENT:  Head: Normocephalic.  Cardiovascular: Normal rate, regular rhythm and normal heart sounds.   Pulmonary/Chest: Effort normal and breath sounds normal. No respiratory distress. She has no wheezes. She has no rales.  Abdominal: Soft. Bowel sounds are normal. She exhibits no distension. There is no tenderness. There is no rebound.  Musculoskeletal: She exhibits no edema.  Neurological: She is alert.  Not oriented to Place time or person. Unable to do further exam due to dementia.    Labs reviewed:  Recent Labs  07/23/15 0810  08/07/15 0745 11/27/15 0725 12/29/15 0715  NA 144  < > 142 141 141  K 4.0  --  4.0 3.6 4.1  CL 105  --  103 104 106  CO2 30  --  29 28 25   GLUCOSE 97  --  92 83 71  BUN 48*  < > 49* 39* 35*  CREATININE 1.67*  < > 1.67* 1.86* 1.90*  CALCIUM 9.8  --  9.7 9.4 9.6  MG 2.5*  --  2.6* 2.4  --   < > = values in this interval not displayed. No results for input(s): AST, ALT, ALKPHOS, BILITOT, PROT, ALBUMIN in the last 8760 hours. No results for input(s): WBC, NEUTROABS, HGB, HCT, MCV, PLT in the last 8760 hours. Lab Results  Component Value Date   TSH 0.723 11/27/2015   Lab Results  Component Value Date   HGBA1C  05/17/2007    5.4 (NOTE)   The ADA recommends the following therapeutic goals for glycemic   control related to Hgb A1C measurement:   Goal of Therapy:   < 7.0% Hgb A1C   Action Suggested:  > 8.0% Hgb A1C   Ref:  Diabetes Care, 22, Suppl. 1, 1999   Lab Results  Component Value Date   CHOL  05/22/2007    175        ATP III  CLASSIFICATION:  <200     mg/dL   Desirable  200-239  mg/dL   Borderline High  >=240    mg/dL   High   HDL 37 (L) 05/22/2007   LDLCALC  05/22/2007    96        Total Cholesterol/HDL:CHD Risk Coronary Heart Disease Risk Table                     Men   Women  1/2 Average Risk   3.4  3.3   TRIG 208 (H) 05/22/2007   CHOLHDL 4.7 05/22/2007    Significant Diagnostic Results in last 30 days:  No results found.  Assessment/Plan  Essential hypertension  BP stable Continue Norvasc  Chronic renal disease, stage 4 (severe)  Last Creat of 1.9 in 08/17. All other labs stable. Would not repeat labs right now . Will need follow up in Few months.   Gastroesophageal reflux disease Patient stable on Nexium   Osteoporosis Continue calcium and Vitamin D  Infiltrating ductal carcinoma of right female breast Treated with Mastectomy. Not on Any hormone therapy due to her Prognosis.  Dementia Continue supportive care.     Family/ staff Communication:   Labs/tests ordered:

## 2016-02-16 DIAGNOSIS — H2513 Age-related nuclear cataract, bilateral: Secondary | ICD-10-CM | POA: Diagnosis not present

## 2016-02-16 DIAGNOSIS — H40013 Open angle with borderline findings, low risk, bilateral: Secondary | ICD-10-CM | POA: Diagnosis not present

## 2016-02-22 ENCOUNTER — Non-Acute Institutional Stay (SKILLED_NURSING_FACILITY): Payer: Medicare Other | Admitting: Internal Medicine

## 2016-02-22 DIAGNOSIS — G309 Alzheimer's disease, unspecified: Principal | ICD-10-CM

## 2016-02-22 DIAGNOSIS — F0281 Dementia in other diseases classified elsewhere with behavioral disturbance: Secondary | ICD-10-CM

## 2016-02-22 DIAGNOSIS — I10 Essential (primary) hypertension: Secondary | ICD-10-CM

## 2016-02-22 DIAGNOSIS — N289 Disorder of kidney and ureter, unspecified: Secondary | ICD-10-CM

## 2016-02-22 DIAGNOSIS — K3184 Gastroparesis: Secondary | ICD-10-CM | POA: Diagnosis not present

## 2016-02-22 DIAGNOSIS — G308 Other Alzheimer's disease: Secondary | ICD-10-CM | POA: Diagnosis not present

## 2016-02-22 NOTE — Progress Notes (Signed)
This is a routine visit.  Level care skilled.  Facility is [facility].  Chief Complaint  Patient presents with  . Medical Management of Chronic Issues    Routine visit  Medical management of dementia with behavior is gastroparesis hypertension  HPI:  Pt is a 80 y.o. female seen today for follow-up of chronic medical conditions as noted above.  She has had a period of stability-family has expressed desire for conservative follow-up of her issue secondary to her significant dementia with minimalization of labs   She has had a significant history of gastroparesis with this appears relatively well controlled now on the Zofran 4 times a day she is also on a proton pump inhibitor-as well as MiraLAX. And docusate sodium--I did speak with her daughter today who actually is a dietitian in this facility-she would like to have the proton pump inhibitor discontinued to try to minimize meds-and monitor for any symptoms and at this point will discontinue this she is on Prilosec   Her blood pressure appears to be stable on Norvasc 5 mg a day recent blood pressures 127/66-108/68-at times in the past she's had some elevated readings I suspect this was due  to agitation     In regards to her dementia she is not currently on any medication except supportive care she appears to be doing relatively well weight appears to be relatively stable in the mid--high   130s appears her appetite is spotty--  She does have a history of right breast cancer has been followed by oncology earlier no medication because of conservative desires.              Past Medical History  Diagnosis Date  . Dementia   . Gastroparalysis   . HTN (hypertension)   . Gallstones   . Reflux   . Cancer ([place])   . Osteoporosis 11/18/2011  . Infiltrating ductal carcinoma of right female breast ([place]) 12/02/2009    Qualifier: History of  By: [name] CMA [name]), June           Past Surgical History   Procedure Laterality Date  . Tonsillectomy and adenoidectomy    . Breast biopsy    . Breast lumpectomy    . Esophagogastroduodenoscopy  12/11/2009    Dr. Fuller Plan: hiatal hernia  . Colonoscopy  2009    Dr. Fuller Plan: normal  . Esophagogastroduodenoscopy N/A 01/29/2014    Procedure: ESOPHAGOGASTRODUODENOSCOPY (EGD);  Surgeon: [name], MD;  [facility];  Service: Endoscopy;  Laterality: N/A;  245        Allergies  Allergen Reactions  . Aricept [Donepezil Hydrochloride] Nausea And Vomiting  . Lithium Nausea And Vomiting           Current Outpatient Prescriptions on File Prior to Visit  Medication Sig Dispense Refill  . acetaminophen (TYLENOL) 160 MG/5ML liquid 20 cc every 4 hours as needed for pain or temp over 101F    . acetaminophen (TYLENOL) 325 MG tablet 1 by mouth twice daily, 2 by mouth every 6 hours as needed for pain/fever    . amLODipine (NORVASC) 5 MG tablet Take 5 mg by mouth daily.    . calcium-vitamin D (OSCAL WITH D) 500-200 MG-UNIT per tablet Take 1 tablet by mouth 2 (two) times daily.    [name] docusate (COLACE) 50 MG/5ML liquid Take 100 mg by mouth daily.    [name] omeprazole (PRILOSEC) 40 MG capsule Take 40 mg by mouth daily.    . polyethylene glycol (MIRALAX /  GLYCOLAX) packet Take 17 g by mouth daily.      [name] ZINC OXIDE, TOPICAL, 10 % CREA Apply sparingly to buttocks d/t redness     No current facility-administered medications on file prior to visit.     Review of Systems  Unattainable secondary to dementia nursing staff does not report any acute issues-I speak with her daughter as well who states her mom continues to have a very spotty appetite but otherwise does not report any acute issues-again her daughter would like the Prilosec to be discontinued      Immunization History  Administered Date(s) Administered  . Influenza-Unspecified 02/13/2014       Pertinent  Health Maintenance Due  Topic Date Due  . PNA  vac Low Risk Adult (1 of 2 - PCV13) 10/05/2016 (Originally 01/13/2003)  . INFLUENZA VACCINE  12/08/2015  . DEXA SCAN  Completed   No flowsheet data found. Functional Status Survey:    Temperature is 97.6 pulse 62 respirations 20 blood pressure 127/66 weight appears to be stable at 139.4   Physical Exam   In general this is a frail elderly female in no distress sitting comfortably in her wheelchair she is pleasant and somewhat cooperative today.  Skin is warm and dry I do not really see signs of cellulitis on her upper extremities although she will develop this at times.  Eyes pupils appear reactive to light visual acuity appears grossly intact sclera and conjunctiva are clear.  Chest is clear to auscultation there is no labored breathing there is poor respiratory effort.  Heart limited exam regular rate and rhythm what I was able to auscultate appear to be in the 60s-radial pulse was in the 60s as well as.  Abdomen soft nontender positive bowel sounds protuberant.  Muscle skeletal is able to move all extremities 4 strength appears to be intact all 4 extremities she is able to stand although certainly this is discouraged secondary to fall risk.  Neurologic is grossly intact her speech is clear.   Psych again she is somewhat cooperative with exam today continues with severe dementia she is pleasant smiling.      Labs reviewed:  12/29/2015.  Sodium 141 potassium 4.1 BUN 35 creatinine 1.9.  July 2017-magnesium 2.4.  11/27/2015.  Sodium 141 potassium 3.6 BUN 39 creatinine 1.86.  TSH-0.723    08/07/2015 sodium 142 potassium 4 BUN 49 creatinine 1.67.  01/20/2015.  Liver function tests within normal limits.  WBC 7.1 hemoglobin 13.4 platelets 165  Recent Labs (within last 365 days)   Recent Labs  01/30/15 0720 07/23/15 0810 08/07/15 0745  NA 141 144 142  K 4.0 4.0 4.0  CL 106 105 103  CO2 29 30 29   GLUCOSE 90 97 92  BUN 46* 48* 49*    CREATININE 1.66* 1.67* 1.67*  CALCIUM 9.2 9.8 9.7  MG  --  2.5* 2.6*      Recent Labs (within last 365 days)   Recent Labs  01/21/15 0740 01/22/15 0705 01/26/15 0700  AST 29 51* 19  ALT 36 73* 29  ALKPHOS 69 70 56  BILITOT 0.7 0.6 0.5  PROT 7.7 7.8 6.7  ALBUMIN 4.3 4.2 3.5      Recent Labs (within last 365 days)   Recent Labs  01/21/15 0645 01/22/15 0705 01/23/15 0810 01/26/15 0700  WBC 13.6* 11.7* 8.5 7.1  NEUTROABS 11.3* 9.7*  --  5.0  HGB 14.9 14.6 13.2 13.4  HCT 48.7* 50.1* 42.4 42.9  MCV 96.2 98.4 94.4  94.7  PLT 225 223 172 165     Recent Labs       Lab Results  Component Value Date   TSH 0.701 01/22/2015     Recent Labs        Lab Results  Component Value Date   HGBA1C  05/17/2007    5.4 (NOTE)   The ADA recommends the following therapeutic goals for glycemic   control related to Hgb A1C measurement:   Goal of Therapy:   < 7.0% Hgb A1C   Action Suggested:  > 8.0% Hgb A1C   Ref:  Diabetes Care, 22, Suppl. 1, 1999     Recent Labs        Lab Results  Component Value Date   CHOL  05/22/2007    175        ATP III CLASSIFICATION:  <200     mg/dL   Desirable  200-239  mg/dL   Borderline High  >=240    mg/dL   High   HDL 37* 05/22/2007   LDLCALC  05/22/2007    96        Total Cholesterol/HDL:CHD Risk Coronary Heart Disease Risk Table                     Men   Women  1/2 Average Risk   3.4   3.3   TRIG 208* 05/22/2007   CHOLHDL 4.7 05/22/2007      Significant Diagnostic Results in last 30 days:  Imaging Results  No results found.    Assessment/Plan  #1 dementia with behaviors-this appears relatively stable with supportive care at this point continue to monitor she had times will have periods of agitation but usually eventually can be redirected.  #2 history of hypertension have variable systolicsIn the past although this appears to be stabilized--I do not see consistent elevations she is on Norvasc 5 mg  a day.  #3 history gastroparesis at one point she been on Reglan but apparently developed some symptoms of tardive dyskinesia-this was discontinued she is on Zofran 4 times a day   She does have some history of GERD her family would like to proton pump inhibitor discontinued and monitor for any signs of GERD or increased dysphagia-at this point will do this and monitor-again family would like to minimize medications  #4 history of breast cancer again conservative follow-up desired per patient's significant dementia currently not on any agent.  #5 history renal insufficiency most recent creatinine 1.9 on lab done 12/29/2015 this is relatively baseline with previous values again would not draw follow-up labs at this point secondary to family wishes to minimize lab draws  [phone]

## 2016-03-28 ENCOUNTER — Non-Acute Institutional Stay (SKILLED_NURSING_FACILITY): Payer: Medicare Other | Admitting: Internal Medicine

## 2016-03-28 ENCOUNTER — Encounter: Payer: Self-pay | Admitting: Internal Medicine

## 2016-03-28 DIAGNOSIS — K3184 Gastroparesis: Secondary | ICD-10-CM | POA: Diagnosis not present

## 2016-03-28 DIAGNOSIS — N289 Disorder of kidney and ureter, unspecified: Secondary | ICD-10-CM | POA: Diagnosis not present

## 2016-03-28 DIAGNOSIS — L03114 Cellulitis of left upper limb: Secondary | ICD-10-CM

## 2016-03-28 NOTE — Progress Notes (Signed)
Location:   [place] Room Number: 110/W Place of Service:  SNF [phone]) Provider:  Granville Lewis  [name], MD  Patient Care Team: [name], MD as PCP - General (Internal Medicine)  Extended Emergency Contact Information Primary Emergency Contact: [facility] Address: [address]          [place], [place] [address] [name] of [place] Phone: [phone] Work Phone: [phone] Mobile Phone: [phone] Relation: Daughter Secondary Emergency Contact: [name] Address: [address]          [place], [place] [address] [name] of [place] Phone: [phone] Mobile Phone: [phone] Relation: Relative  Code Status:  DNR Goals of care: Advanced Directive information Advanced Directives 03/28/2016  Does patient have an advance directive? Yes  Type of Advance Directive Out of facility DNR (pink MOST or yellow form)  Does patient want to make changes to advanced directive? No - Patient declined  Copy of advanced directive(s) in chart? Yes  Would patient like information on creating an advanced directive? -  Pre-existing out of facility DNR order (yellow form or pink MOST form) -     Chief Complaint  Patient presents with  . Acute Visit    Skin Tear Infection F/U    HPI:  Pt is a 80 y.o. female seen today for an acute visit for  Concerns about erythema around  a skin tear on her left arm. Apparently over the weekend she sustained a skin tear to her left forearm-today there Steri-Strips applied whether appears to be some erythema developing around the site-patient cannot really give any review of systems secondary to severe dementia but she appears to be at her baseline     Past Medical History:  Diagnosis Date  . Cancer ([place])   . Dementia   . Gallstones   . Gastroparalysis   . HTN (hypertension)   . Infiltrating ductal carcinoma of right female breast ([place]) 12/02/2009   Qualifier: History of  By: [name] CMA  [name]), June    . Osteoporosis 11/18/2011  . Reflux    Past Surgical History:  Procedure Laterality Date  . BREAST BIOPSY    . BREAST LUMPECTOMY    . COLONOSCOPY  2009   Dr. Fuller Plan: normal  . ESOPHAGOGASTRODUODENOSCOPY  12/11/2009   Dr. Fuller Plan: hiatal hernia  . ESOPHAGOGASTRODUODENOSCOPY N/A 01/29/2014   Procedure: ESOPHAGOGASTRODUODENOSCOPY (EGD);  Surgeon: [name], MD;  [facility];  Service: Endoscopy;  Laterality: N/A;  245  . TONSILLECTOMY AND ADENOIDECTOMY      Allergies  Allergen Reactions  . Aricept [Donepezil Hydrochloride] Nausea And Vomiting  . Exelon [Rivastigmine Tartrate]   . Lithium Nausea And Vomiting    Current Outpatient Prescriptions on File Prior to Visit  Medication Sig Dispense Refill  . acetaminophen (TYLENOL) 325 MG tablet Take one tablet by mouth twice daily for pain    . amLODipine (NORVASC) 5 MG tablet Take 5 mg by mouth daily.    . calcium-vitamin D (OSCAL WITH D) 500-200 MG-UNIT per tablet Take 1 tablet by mouth 2 (two) times daily.    [name] docusate (COLACE) 50 MG/5ML liquid Take 100 mg by mouth daily.    . ondansetron (ZOFRAN-ODT) 4 MG disintegrating tablet Dissolve 1 tablet in mouth 4 times daily before meals and at bedtime for nausea    . polyethylene glycol (MIRALAX / GLYCOLAX) packet Take 17 g by mouth daily.      [name] ZINC OXIDE, TOPICAL, 10 % CREA Apply  sparingly to buttocks d/t redness     No current facility-administered medications on file prior to visit.      Review of Systems   Unattainable secondary to dementia  Immunization History  Administered Date(s) Administered  . Influenza-Unspecified 02/13/2014, 02/09/2016   Pertinent  Health Maintenance Due  Topic Date Due  . PNA vac Low Risk Adult (1 of 2 - PCV13) 10/05/2016 (Originally 01/13/2003)  . INFLUENZA VACCINE  Completed  . DEXA SCAN  Completed   No flowsheet data found. Functional Status Survey:   Temperature is 98.7 pulse is 62 respirations 18 last blood  pressure 141/84   Physical Exam In general this is a frail elderly female in no distress sitting comfortably in her wheelchair she is somewhat anxious  Skin is warm and dry I She does have a skin tear on her left forearm area and there are Steri-Strips applied there is a small open area there appears to be some erythema around it no active.drainage  But a small amount of bleeding  Eyes pupils appear reactive to light visual acuity appears grossly intact sclera and conjunctiva are clear.  Chest is clear to auscultation there is no labored breathing there is poor respiratory effort.  Heart limited exam regular rate and rhythm what I was able to auscultate appear to be in the 60s-radial pulse was in the 60s  Abdomen soft nontender positive bowel sounds protuberant.  Muscle skeletal is able to move all extremities 4 strength appears to be intact all 4 extremitie   Neurologic is grossly intact her speech is clear.   Psych again she is somewhat cooperative with exam today  Labs reviewed:  Recent Labs  07/23/15 0810  08/07/15 0745 11/27/15 0725 12/29/15 0715  NA 144  < > 142 141 141  K 4.0  --  4.0 3.6 4.1  CL 105  --  103 104 106  CO2 30  --  29 28 25   GLUCOSE 97  --  92 83 71  BUN 48*  < > 49* 39* 35*  CREATININE 1.67*  < > 1.67* 1.86* 1.90*  CALCIUM 9.8  --  9.7 9.4 9.6  MG 2.5*  --  2.6* 2.4  --   < > = values in this interval not displayed. No results for input(s): AST, ALT, ALKPHOS, BILITOT, PROT, ALBUMIN in the last 8760 hours. No results for input(s): WBC, NEUTROABS, HGB, HCT, MCV, PLT in the last 8760 hours. Lab Results  Component Value Date   TSH 0.723 11/27/2015   Lab Results  Component Value Date   HGBA1C  05/17/2007    5.4 (NOTE)   The ADA recommends the following therapeutic goals for glycemic   control related to Hgb A1C measurement:   Goal of Therapy:   < 7.0% Hgb A1C   Action Suggested:  > 8.0% Hgb A1C   Ref:  Diabetes Care, 22, Suppl. 1, 1999     Lab Results  Component Value Date   CHOL  05/22/2007    175        ATP III CLASSIFICATION:  <200     mg/dL   Desirable  200-239  mg/dL   Borderline High  >=240    mg/dL   High   HDL 37 (L) 05/22/2007   LDLCALC  05/22/2007    96        Total Cholesterol/HDL:CHD Risk Coronary Heart Disease Risk Table  Men   Women  1/2 Average Risk   3.4   3.3   TRIG 208 (H) 05/22/2007   CHOLHDL 4.7 05/22/2007    Significant Diagnostic Results in last 30 days:  No results found.  Assessment/Plan  #1 suspected cellulitis left arm-will treat with a course of doxycycline and milligrams daily for 7 days-also probiotic twice a day for 10 days monitor for resolution  History of renal insufficiency most recent creatinine was done back in August 2017 at 1.9 which is a bit on the higher end of her baseline-her daughter does not wish aggressive labs here will monitor at periodic intervals continues taking drink at baseline apparently drinks much better than she eats but this is not new.  History of elevated magnesium this normalized at 2.4 on lab done in late August as well.   History of gastroparesis she continues on Zofran 4 times a day this appears to be stable we have taken her off the Prilosec per family request to minimize medications she appears to have tolerated the discontinuance of this without adverse effects Weight appears to be stable and 138 pounds   [place], PA-C [phone]

## 2016-04-05 ENCOUNTER — Encounter: Payer: Self-pay | Admitting: Internal Medicine

## 2016-04-05 NOTE — Progress Notes (Signed)
Location:   [facility] Room Number: 110/W Place of Service:  SNF [phone]) Provider:  Clydene Fake, MD  Patient Care Team: [name], MD as PCP - General (Internal Medicine)  Extended Emergency Contact Information Primary Emergency Contact: [name]: [address]          [name], [name] [address] [name] of [place] Phone: [phone] Work Phone: [phone] Mobile Phone: [phone] Relation: Daughter Secondary Emergency Contact: [name] Address: [address]          [place], [place] [address] [name] of [name] Phone: [phone] Mobile Phone: [phone] Relation: Relative  Code Status:  DNR Goals of care: Advanced Directive information Advanced Directives 04/05/2016  Does Patient Have a Medical Advance Directive? Yes  Type of Advance Directive Out of facility DNR (pink MOST or yellow form)  Does patient want to make changes to medical advance directive? No - Patient declined  Copy of [place] in Chart? -  Would patient like information on creating a medical advance directive? No - Patient declined  Pre-existing out of facility DNR order (yellow form or pink MOST form) -     Chief Complaint  Patient presents with  . Medical Management of Chronic Issues    [place] Visit    HPI:  Pt is a 80 y.o. female seen today for medical management of chronic diseases.     Past Medical History:  Diagnosis Date  . Cancer ([place])   . Dementia   . Gallstones   . Gastroparalysis   . HTN (hypertension)   . Infiltrating ductal carcinoma of right female breast ([name]) 12/02/2009   Qualifier: History of  By: [name] CMA [name]), June    . Osteoporosis 11/18/2011  . Reflux    Past Surgical History:  Procedure Laterality Date  . BREAST BIOPSY    . BREAST LUMPECTOMY    . COLONOSCOPY  2009   Dr. Fuller Plan: normal  . ESOPHAGOGASTRODUODENOSCOPY  12/11/2009   Dr. Fuller Plan: hiatal hernia  .  ESOPHAGOGASTRODUODENOSCOPY N/A 01/29/2014   Procedure: ESOPHAGOGASTRODUODENOSCOPY (EGD);  Surgeon: [name], MD;  [facility];  Service: Endoscopy;  Laterality: N/A;  245  . TONSILLECTOMY AND ADENOIDECTOMY      Allergies  Allergen Reactions  . Aricept [Donepezil Hydrochloride] Nausea And Vomiting  . Exelon [Rivastigmine Tartrate]   . Lithium Nausea And Vomiting    Current Outpatient Prescriptions on File Prior to Visit  Medication Sig Dispense Refill  . acetaminophen (TYLENOL) 325 MG tablet Take one tablet by mouth twice daily for pain    . amLODipine (NORVASC) 5 MG tablet Take 5 mg by mouth daily.    . calcium-vitamin D (OSCAL WITH D) 500-200 MG-UNIT per tablet Take 1 tablet by mouth 2 (two) times daily.    [name] docusate (COLACE) 50 MG/5ML liquid Take 100 mg by mouth daily.    . ondansetron (ZOFRAN-ODT) 4 MG disintegrating tablet Dissolve 1 tablet in mouth 4 times daily before meals and at bedtime for nausea    . polyethylene glycol (MIRALAX / GLYCOLAX) packet Take 17 g by mouth daily.      [name] ZINC OXIDE, TOPICAL, 10 % CREA Apply sparingly to buttocks d/t redness     No current facility-administered medications on file prior to visit.     Review of Systems  Immunization History  Administered Date(s) Administered  . Influenza-Unspecified 02/13/2014, 02/09/2016   Pertinent  Health Maintenance Due  Topic Date Due  .  PNA vac Low Risk Adult (1 of 2 - PCV13) 10/05/2016 (Originally 01/13/2003)  . INFLUENZA VACCINE  Completed  . DEXA SCAN  Completed   No flowsheet data found. Functional Status Survey:    Vitals:   04/05/16 1336  BP: 122/74  Pulse: 72  Resp: 20  Temp: 98.3 F (36.8 C)  TempSrc: Oral   There is no height or weight on file to calculate BMI. Physical Exam  Labs reviewed:  Recent Labs  07/23/15 0810  08/07/15 0745 11/27/15 0725 12/29/15 0715  NA 144  < > 142 141 141  K 4.0  --  4.0 3.6 4.1  CL 105  --  103 104 106  CO2 30  --  29 28 25     GLUCOSE 97  --  92 83 71  BUN 48*  < > 49* 39* 35*  CREATININE 1.67*  < > 1.67* 1.86* 1.90*  CALCIUM 9.8  --  9.7 9.4 9.6  MG 2.5*  --  2.6* 2.4  --   < > = values in this interval not displayed. No results for input(s): AST, ALT, ALKPHOS, BILITOT, PROT, ALBUMIN in the last 8760 hours. No results for input(s): WBC, NEUTROABS, HGB, HCT, MCV, PLT in the last 8760 hours. Lab Results  Component Value Date   TSH 0.723 11/27/2015   Lab Results  Component Value Date   HGBA1C  05/17/2007    5.4 (NOTE)   The ADA recommends the following therapeutic goals for glycemic   control related to Hgb A1C measurement:   Goal of Therapy:   < 7.0% Hgb A1C   Action Suggested:  > 8.0% Hgb A1C   Ref:  Diabetes Care, 22, Suppl. 1, 1999   Lab Results  Component Value Date   CHOL  05/22/2007    175        ATP III CLASSIFICATION:  <200     mg/dL   Desirable  200-239  mg/dL   Borderline High  >=240    mg/dL   High   HDL 37 (L) 05/22/2007   LDLCALC  05/22/2007    96        Total Cholesterol/HDL:CHD Risk Coronary Heart Disease Risk Table                     Men   Women  1/2 Average Risk   3.4   3.3   TRIG 208 (H) 05/22/2007   CHOLHDL 4.7 05/22/2007    Significant Diagnostic Results in last 30 days:  No results found.  Assessment/Plan There are no diagnoses linked to this encounter.   Family/ staff Communication:   Labs/tests ordered:       This encounter was created in error - please disregard.

## 2016-04-07 ENCOUNTER — Non-Acute Institutional Stay (SKILLED_NURSING_FACILITY): Payer: Medicare Other | Admitting: Internal Medicine

## 2016-04-07 ENCOUNTER — Encounter: Payer: Self-pay | Admitting: Internal Medicine

## 2016-04-07 DIAGNOSIS — G309 Alzheimer's disease, unspecified: Secondary | ICD-10-CM

## 2016-04-07 DIAGNOSIS — I10 Essential (primary) hypertension: Secondary | ICD-10-CM

## 2016-04-07 DIAGNOSIS — K3184 Gastroparesis: Secondary | ICD-10-CM | POA: Diagnosis not present

## 2016-04-07 DIAGNOSIS — F0281 Dementia in other diseases classified elsewhere with behavioral disturbance: Secondary | ICD-10-CM

## 2016-04-07 DIAGNOSIS — N289 Disorder of kidney and ureter, unspecified: Secondary | ICD-10-CM

## 2016-04-07 DIAGNOSIS — G308 Other Alzheimer's disease: Secondary | ICD-10-CM | POA: Diagnosis not present

## 2016-04-07 NOTE — Progress Notes (Signed)
Location:   [facility] Room Number: 110/W Place of Service:  SNF [phone]) Provider: Clydene Fake, MD  Patient Care Team: [name], MD as PCP - General (Internal Medicine)  Extended Emergency Contact Information Primary Emergency Contact: [facility] Address: [address]          [place], [place] [address] [name] of [place] Phone: [phone] Work Phone: [phone] Mobile Phone: [phone] Relation: Daughter Secondary Emergency Contact: [name] Address: [address]          [place], [place] [address] [name] of [place] Phone: [phone] Mobile Phone: [phone] Relation: Relative  Code Status:  DNR Goals of care: Advanced Directive information Advanced Directives 04/07/2016  Does Patient Have a Medical Advance Directive? Yes  Type of Advance Directive Out of facility DNR (pink MOST or yellow form)  Does patient want to make changes to medical advance directive? No - Patient declined  Copy of [place] in Chart? -  Would patient like information on creating a medical advance directive? No - Patient declined  Pre-existing out of facility DNR order (yellow form or pink MOST form) -     Chief Complaint  Patient presents with  . Medical Management of Chronic Issues    Routine Visit    HPI:  Pt is a 80 y.o. female seen today for medical management of chronic diseases.  Patient has history of  Hypertension, Chronic renal disease, Breast Cancer S/P Limited Mastectomy and Dementia with Behavior issues. Patient had some redness and was started on Antibiotics. And it seems to have resolved. Her weight has been stable at 139 lbs. Her Prilosec was Discontinued as  patient being asymptomatic. Patient had been stable with no new issues. Patient unable to give any history but per nurses she has been stable.   Past Medical History:  Diagnosis Date  . Cancer ([place])   . Dementia   . Gallstones    . Gastroparalysis   . HTN (hypertension)   . Infiltrating ductal carcinoma of right female breast ([place]) 12/02/2009   Qualifier: History of  By: [name] CMA [name]), June    . Osteoporosis 11/18/2011  . Reflux    Past Surgical History:  Procedure Laterality Date  . BREAST BIOPSY    . BREAST LUMPECTOMY    . COLONOSCOPY  2009   Dr. Fuller Plan: normal  . ESOPHAGOGASTRODUODENOSCOPY  12/11/2009   Dr. Fuller Plan: hiatal hernia  . ESOPHAGOGASTRODUODENOSCOPY N/A 01/29/2014   Procedure: ESOPHAGOGASTRODUODENOSCOPY (EGD);  Surgeon: [name], MD;  [facility];  Service: Endoscopy;  Laterality: N/A;  245  . TONSILLECTOMY AND ADENOIDECTOMY      Allergies  Allergen Reactions  . Aricept [Donepezil Hydrochloride] Nausea And Vomiting  . Exelon [Rivastigmine Tartrate]   . Lithium Nausea And Vomiting    Current Outpatient Prescriptions on File Prior to Visit  Medication Sig Dispense Refill  . acetaminophen (TYLENOL) 325 MG tablet Take one tablet by mouth twice daily for pain    . amLODipine (NORVASC) 5 MG tablet Take 5 mg by mouth daily.    . calcium-vitamin D (OSCAL WITH D) 500-200 MG-UNIT per tablet Take 1 tablet by mouth 2 (two) times daily.    [name] docusate (COLACE) 50 MG/5ML liquid Take 100 mg by mouth daily.    . ondansetron (ZOFRAN-ODT) 4 MG disintegrating tablet Dissolve 1 tablet in mouth 4 times daily before meals and at bedtime for nausea    . polyethylene glycol (MIRALAX /  GLYCOLAX) packet Take 17 g by mouth daily.      . Probiotic Product (RISA-BID PROBIOTIC) TABS Take 1 tablet by mouth twice daily    . ZINC OXIDE, TOPICAL, 10 % CREA Apply sparingly to buttocks d/t redness     No current facility-administered medications on file prior to visit.     Review of Systems  Unable to perform ROS: Dementia    Immunization History  Administered Date(s) Administered  . Influenza-Unspecified 02/13/2014, 02/09/2016   Pertinent  Health Maintenance Due  Topic Date Due  . PNA vac Low  Risk Adult (1 of 2 - PCV13) 10/05/2016 (Originally 01/13/2003)  . INFLUENZA VACCINE  Completed  . DEXA SCAN  Completed   No flowsheet data found. Functional Status Survey:    Vitals:   04/07/16 1145  BP: 122/74  Pulse: 72  Resp: 20  Temp: 98.3 F (36.8 C)  TempSrc: Oral   There is no height or weight on file to calculate BMI. Physical Exam  Constitutional: She appears well-developed and well-nourished.  HENT:  Head: Normocephalic.  Mouth/Throat: Oropharynx is clear and moist.  Cardiovascular: Normal rate, regular rhythm and normal heart sounds.   Pulmonary/Chest: Effort normal and breath sounds normal. No respiratory distress. She has no wheezes. She has no rales.  Abdominal: Soft. Bowel sounds are normal. She exhibits no distension. There is no tenderness. There is no rebound.  Musculoskeletal: She exhibits no edema.  Neurological: She is alert.  Does not follow any commands.    Labs reviewed:  Recent Labs  07/23/15 0810  08/07/15 0745 11/27/15 0725 12/29/15 0715  NA 144  < > 142 141 141  K 4.0  --  4.0 3.6 4.1  CL 105  --  103 104 106  CO2 30  --  29 28 25   GLUCOSE 97  --  92 83 71  BUN 48*  < > 49* 39* 35*  CREATININE 1.67*  < > 1.67* 1.86* 1.90*  CALCIUM 9.8  --  9.7 9.4 9.6  MG 2.5*  --  2.6* 2.4  --   < > = values in this interval not displayed. No results for input(s): AST, ALT, ALKPHOS, BILITOT, PROT, ALBUMIN in the last 8760 hours. No results for input(s): WBC, NEUTROABS, HGB, HCT, MCV, PLT in the last 8760 hours. Lab Results  Component Value Date   TSH 0.723 11/27/2015   Lab Results  Component Value Date   HGBA1C  05/17/2007    5.4 (NOTE)   The ADA recommends the following therapeutic goals for glycemic   control related to Hgb A1C measurement:   Goal of Therapy:   < 7.0% Hgb A1C   Action Suggested:  > 8.0% Hgb A1C   Ref:  Diabetes Care, 22, Suppl. 1, 1999   Lab Results  Component Value Date   CHOL  05/22/2007    175        ATP III  CLASSIFICATION:  <200     mg/dL   Desirable  200-239  mg/dL   Borderline High  >=240    mg/dL   High   HDL 37 (L) 05/22/2007   LDLCALC  05/22/2007    96        Total Cholesterol/HDL:CHD Risk Coronary Heart Disease Risk Table                     Men   Women  1/2 Average Risk   3.4   3.3   TRIG 208 (H) 05/22/2007  CHOLHDL 4.7 05/22/2007    Significant Diagnostic Results in last 30 days:  No results found.  Assessment/Plan  Renal insufficiency Patient creat is 1.9 she has CKD stage 4 Will repeat the labs if agreeable with patients daughter to follow Creat.and potassium.and Magnesium.  Hypertension Well controlled on Norvasc.  Gastroparesis Doing well with no active symptoms . Weight stable. On zofran with meals. Dementia  Continue supportive care.   Family/ staff Communication:   Labs/tests ordered:

## 2016-04-11 ENCOUNTER — Encounter (HOSPITAL_COMMUNITY)
Admission: AD | Admit: 2016-04-11 | Discharge: 2016-04-11 | Disposition: A | Discharge status: Home / Self Care | Payer: Medicare Other | Source: Skilled Nursing Facility | Attending: Internal Medicine | Admitting: Internal Medicine

## 2016-04-11 DIAGNOSIS — Z9181 History of falling: Secondary | ICD-10-CM | POA: Diagnosis not present

## 2016-04-11 DIAGNOSIS — K3184 Gastroparesis: Secondary | ICD-10-CM | POA: Diagnosis not present

## 2016-04-11 DIAGNOSIS — F0391 Unspecified dementia with behavioral disturbance: Secondary | ICD-10-CM | POA: Insufficient documentation

## 2016-04-11 DIAGNOSIS — Z853 Personal history of malignant neoplasm of breast: Secondary | ICD-10-CM | POA: Diagnosis not present

## 2016-04-11 DIAGNOSIS — F319 Bipolar disorder, unspecified: Secondary | ICD-10-CM | POA: Diagnosis not present

## 2016-04-11 DIAGNOSIS — I10 Essential (primary) hypertension: Secondary | ICD-10-CM | POA: Diagnosis not present

## 2016-04-11 LAB — CBC
HCT: 37.3 % (ref 36.0–46.0)
HEMOGLOBIN: 11.8 g/dL — AB (ref 12.0–15.0)
MCH: 29.6 pg (ref 26.0–34.0)
MCHC: 31.6 g/dL (ref 30.0–36.0)
MCV: 93.5 fL (ref 78.0–100.0)
Platelets: 212 10*3/uL (ref 150–400)
RBC: 3.99 MIL/uL (ref 3.87–5.11)
RDW: 13.2 % (ref 11.5–15.5)
WBC: 6.3 10*3/uL (ref 4.0–10.5)

## 2016-04-11 LAB — COMPREHENSIVE METABOLIC PANEL
ALBUMIN: 3.5 g/dL (ref 3.5–5.0)
ALT: 12 U/L — ABNORMAL LOW (ref 14–54)
ANION GAP: 9 (ref 5–15)
AST: 20 U/L (ref 15–41)
Alkaline Phosphatase: 65 U/L (ref 38–126)
BUN: 40 mg/dL — AB (ref 6–20)
CO2: 27 mmol/L (ref 22–32)
Calcium: 9.4 mg/dL (ref 8.9–10.3)
Chloride: 103 mmol/L (ref 101–111)
Creatinine, Ser: 1.5 mg/dL — ABNORMAL HIGH (ref 0.44–1.00)
GFR calc Af Amer: 37 mL/min — ABNORMAL LOW (ref >60)
GFR calc non Af Amer: 32 mL/min — ABNORMAL LOW (ref >60)
GLUCOSE: 89 mg/dL (ref 65–99)
POTASSIUM: 3.9 mmol/L (ref 3.5–5.1)
SODIUM: 139 mmol/L (ref 135–145)
Total Bilirubin: 0.3 mg/dL (ref 0.3–1.2)
Total Protein: 6.7 g/dL (ref 6.5–8.1)

## 2016-04-11 LAB — MAGNESIUM: Magnesium: 2.6 mg/dL — ABNORMAL HIGH (ref 1.7–2.4)

## 2016-04-26 ENCOUNTER — Encounter: Payer: Self-pay | Admitting: Internal Medicine

## 2016-04-26 NOTE — Progress Notes (Signed)
Location:   [facility] Room Number: 110/W Place of Service:  SNF [phone]) Provider:  Freddi Starr, MD  Patient Care Team: [name], MD as PCP - General (Internal Medicine)  Extended Emergency Contact Information Primary Emergency Contact: Gi Or Norman Address: [address]          [place], [place] [address] [name] of [place] Phone: [phone] Work Phone: [phone] Mobile Phone: [phone] Relation: Daughter Secondary Emergency Contact: [name] Address: [address]          [place], [place] [address] [name] of [place] Phone: [phone] Mobile Phone: [phone] Relation: Relative  Code Status:  DNR Goals of care: Advanced Directive information Advanced Directives 04/26/2016  Does Patient Have a Medical Advance Directive? Yes  Type of Advance Directive Out of facility DNR (pink MOST or yellow form)  Does patient want to make changes to medical advance directive? No - Patient declined  Copy of [place] in Chart? -  Would patient like information on creating a medical advance directive? No - Patient declined  Pre-existing out of facility DNR order (yellow form or pink MOST form) -     Chief Complaint  Patient presents with  . Medical Management of Chronic Issues    Routine Visit    HPI:  Pt is a 80 y.o. female seen today for medical management of chronic diseases.     Past Medical History:  Diagnosis Date  . Cancer ([place])   . Dementia   . Gallstones   . Gastroparalysis   . HTN (hypertension)   . Infiltrating ductal carcinoma of right female breast ([place]) 12/02/2009   Qualifier: History of  By: [name] CMA [name]), June    . Osteoporosis 11/18/2011  . Reflux    Past Surgical History:  Procedure Laterality Date  . BREAST BIOPSY    . BREAST LUMPECTOMY    . COLONOSCOPY  2009   Dr. Fuller Plan: normal  . ESOPHAGOGASTRODUODENOSCOPY  12/11/2009   Dr. Fuller Plan: hiatal hernia  .  ESOPHAGOGASTRODUODENOSCOPY N/A 01/29/2014   Procedure: ESOPHAGOGASTRODUODENOSCOPY (EGD);  Surgeon: [name], MD;  [facility];  Service: Endoscopy;  Laterality: N/A;  245  . TONSILLECTOMY AND ADENOIDECTOMY      Allergies  Allergen Reactions  . Aricept [Donepezil Hydrochloride] Nausea And Vomiting  . Exelon [Rivastigmine Tartrate]   . Lithium Nausea And Vomiting    Current Outpatient Prescriptions on File Prior to Visit  Medication Sig Dispense Refill  . acetaminophen (TYLENOL) 325 MG tablet Take one tablet by mouth at bedtime    . amLODipine (NORVASC) 5 MG tablet Take 5 mg by mouth daily.    . calcium-vitamin D (OSCAL WITH D) 500-200 MG-UNIT per tablet Take 1 tablet by mouth 2 (two) times daily.    [name] docusate (COLACE) 50 MG/5ML liquid Take 100 mg by mouth daily.    . ondansetron (ZOFRAN-ODT) 4 MG disintegrating tablet Dissolve 1 tablet in mouth 3 times daily before meals and at bedtime for nausea    . polyethylene glycol (MIRALAX / GLYCOLAX) packet Take 17 g by mouth daily.      [name] ZINC OXIDE, TOPICAL, 10 % CREA Apply sparingly to buttocks d/t redness     No current facility-administered medications on file prior to visit.     Review of Systems  Immunization History  Administered Date(s) Administered  . Influenza-Unspecified 02/13/2014, 02/09/2016   Pertinent  Health Maintenance Due  Topic Date Due  . PNA  vac Low Risk Adult (1 of 2 - PCV13) 10/05/2016 (Originally 01/13/2003)  . INFLUENZA VACCINE  Completed  . DEXA SCAN  Completed   No flowsheet data found. Functional Status Survey:    There were no vitals filed for this visit. There is no height or weight on file to calculate BMI. Physical Exam  Labs reviewed:  Recent Labs  08/07/15 0745 11/27/15 0725 12/29/15 0715 04/11/16 0700  NA 142 141 141 139  K 4.0 3.6 4.1 3.9  CL 103 104 106 103  CO2 29 28 25 27   GLUCOSE 92 83 71 89  BUN 49* 39* 35* 40*  CREATININE 1.67* 1.86* 1.90* 1.50*  CALCIUM 9.7  9.4 9.6 9.4  MG 2.6* 2.4  --  2.6*    Recent Labs  04/11/16 0700  AST 20  ALT 12*  ALKPHOS 65  BILITOT 0.3  PROT 6.7  ALBUMIN 3.5    Recent Labs  04/11/16 0700  WBC 6.3  HGB 11.8*  HCT 37.3  MCV 93.5  PLT 212   Lab Results  Component Value Date   TSH 0.723 11/27/2015   Lab Results  Component Value Date   HGBA1C  05/17/2007    5.4 (NOTE)   The ADA recommends the following therapeutic goals for glycemic   control related to Hgb A1C measurement:   Goal of Therapy:   < 7.0% Hgb A1C   Action Suggested:  > 8.0% Hgb A1C   Ref:  Diabetes Care, 22, Suppl. 1, 1999   Lab Results  Component Value Date   CHOL  05/22/2007    175        ATP III CLASSIFICATION:  <200     mg/dL   Desirable  200-239  mg/dL   Borderline High  >=240    mg/dL   High   HDL 37 (L) 05/22/2007   LDLCALC  05/22/2007    96        Total Cholesterol/HDL:CHD Risk Coronary Heart Disease Risk Table                     Men   Women  1/2 Average Risk   3.4   3.3   TRIG 208 (H) 05/22/2007   CHOLHDL 4.7 05/22/2007    Significant Diagnostic Results in last 30 days:  No results found.  Assessment/Plan There are no diagnoses linked to this encounter.   Family/ staff Communication:   Labs/tests ordered:       This encounter was created in error - please disregard.

## 2016-04-28 ENCOUNTER — Encounter: Payer: Self-pay | Admitting: Internal Medicine

## 2016-04-28 ENCOUNTER — Non-Acute Institutional Stay (SKILLED_NURSING_FACILITY): Payer: Medicare Other | Admitting: Internal Medicine

## 2016-04-28 DIAGNOSIS — I10 Essential (primary) hypertension: Secondary | ICD-10-CM

## 2016-04-28 DIAGNOSIS — N289 Disorder of kidney and ureter, unspecified: Secondary | ICD-10-CM

## 2016-04-28 DIAGNOSIS — G308 Other Alzheimer's disease: Secondary | ICD-10-CM

## 2016-04-28 DIAGNOSIS — G309 Alzheimer's disease, unspecified: Principal | ICD-10-CM

## 2016-04-28 DIAGNOSIS — F0281 Dementia in other diseases classified elsewhere with behavioral disturbance: Secondary | ICD-10-CM

## 2016-04-28 DIAGNOSIS — K3184 Gastroparesis: Secondary | ICD-10-CM

## 2016-04-28 NOTE — Progress Notes (Signed)
Location:   [place] Room Number: 110/W Place of Service:  SNF [phone]) Provider:  Freddi Starr, MD  Patient Care Team: [name], MD as PCP - General (Internal Medicine)  Extended Emergency Contact Information Primary Emergency Contact: [facility] Address: [address]          [place], [place] [address] [name] of [place] Phone: [phone] Work Phone: [phone] Mobile Phone: [phone] Relation: Daughter Secondary Emergency Contact: [name] Address: [address]          [place], [place] [address] [name] of [place] Phone: [phone] Mobile Phone: [phone] Relation: Relative  Code Status:  DNR Goals of care: Advanced Directive information Advanced Directives 04/28/2016  Does Patient Have a Medical Advance Directive? Yes  Type of Advance Directive Out of facility DNR (pink MOST or yellow form)  Does patient want to make changes to medical advance directive? No - Patient declined  Copy of [place] in Chart? -  Would patient like information on creating a medical advance directive? No - Patient declined  Pre-existing out of facility DNR order (yellow form or pink MOST form) -     Chief Complaint  Patient presents with  . Medical Management of Chronic Issues    Routine Visit  . Acute Visit    Conjunctivitis    HPI:  Pt is a 80 y.o. female seen today for medical management of chronic diseases.  Including dementia with behaviors-chronic renal disease-hypertension-history of breast cancer status post limited mastectomy.  Also seen for apparently some exudate of her right eye this morning.  She appears to be relatively stable she drinks quite well but apparently has a very spotty appetite takes her supplements well she's actually gained about 4 pounds over the past month.  She has severe dementia but at this point appears relatively stable with supportive care.  She also  has a history of renal insufficiency--but a metabolic panel done on 00/93/8182 I see show some improvement with a creatinine of 1.5 BUN of 40 previous creatinine was 1.9.  In regards to hypertension-she has somewhat variable blood pressures suspect this could be related to agitation at times I do not see consistent elevations recent blood pressure 122/74 which appears relatively baseline she is on Norvasc 5 mg a day.  In regards to conjunctivitis-I did look at her eye and actually appeared be quite clear today-this will have to be monitored.     Past Medical History:  Diagnosis Date  . Cancer ([place])   . Dementia   . Gallstones   . Gastroparalysis   . HTN (hypertension)   . Infiltrating ductal carcinoma of right female breast ([place]) 12/02/2009   Qualifier: History of  By: [name] CMA [name]), June    . Osteoporosis 11/18/2011  . Reflux    Past Surgical History:  Procedure Laterality Date  . BREAST BIOPSY    . BREAST LUMPECTOMY    . COLONOSCOPY  2009   Dr. Fuller Plan: normal  . ESOPHAGOGASTRODUODENOSCOPY  12/11/2009   Dr. Fuller Plan: hiatal hernia  . ESOPHAGOGASTRODUODENOSCOPY N/A 01/29/2014   Procedure: ESOPHAGOGASTRODUODENOSCOPY (EGD);  Surgeon: [name], MD;  [facility];  Service: Endoscopy;  Laterality: N/A;  245  . TONSILLECTOMY AND ADENOIDECTOMY      Allergies  Allergen Reactions  . Aricept [Donepezil Hydrochloride] Nausea And Vomiting  . Exelon [Rivastigmine Tartrate]   . Lithium Nausea And Vomiting    Current Outpatient Prescriptions on File Prior to  Visit  Medication Sig Dispense Refill  . acetaminophen (TYLENOL) 325 MG tablet Take one tablet by mouth at bedtime    . amLODipine (NORVASC) 5 MG tablet Take 5 mg by mouth daily.    . calcium-vitamin D (OSCAL WITH D) 500-200 MG-UNIT per tablet Take 1 tablet by mouth 2 (two) times daily.    [name] docusate (COLACE) 50 MG/5ML liquid Take 100 mg by mouth daily.    . ondansetron (ZOFRAN-ODT) 4 MG disintegrating tablet  Dissolve 1 tablet in mouth 3 times daily before meals and at bedtime for nausea    . polyethylene glycol (MIRALAX / GLYCOLAX) packet Take 17 g by mouth daily.      [name] ZINC OXIDE, TOPICAL, 10 % CREA Apply sparingly to buttocks d/t redness     No current facility-administered medications on file prior to visit.      Review of Systems  Unattainable secondary to dementia please see history of present illness  Immunization History  Administered Date(s) Administered  . Influenza-Unspecified 02/13/2014, 02/09/2016   Pertinent  Health Maintenance Due  Topic Date Due  . PNA vac Low Risk Adult (1 of 2 - PCV13) 10/05/2016 (Originally 01/13/2003)  . INFLUENZA VACCINE  Completed  . DEXA SCAN  Completed   No flowsheet data found. Functional Status Survey:     She is afebrile pulses 70 respirations 20 blood pressure 122/74 weight is 143.8 Physical Exam  In general this is a frail elderly female in no distress sitting comfortably in her wheelchair she is pleasant and somewhat cooperative today. Her daughter is  with her in the room today  Skin is warm and dry I do not really see signs of cellulitis on her upper extremities although she will develop this at times.  Eyes pupils appear reactive to light visual acuity appears grossly intact sclera and conjunctiva are clear.  Chest is clear to auscultation there is no labored breathing there is poor respiratory effort.  Heart --regular rate and rhythm without murmur gallop or rub she does not really have significant lower extremity edema   Abdomen soft nontender positive bowel sounds protuberant.  Muscle skeletal is able to move all extremities 4 strength appears to be intact all 4 extremities she is able to stand although certainly this is discouraged secondary to fall risk.  Neurologic is grossly intact her speech is clear.   Psych again she is somewhat cooperative with exam today continues with severe dementia she is pleasant  smiling.     Labs reviewed:  Recent Labs  08/07/15 0745 11/27/15 0725 12/29/15 0715 04/11/16 0700  NA 142 141 141 139  K 4.0 3.6 4.1 3.9  CL 103 104 106 103  CO2 29 28 25 27   GLUCOSE 92 83 71 89  BUN 49* 39* 35* 40*  CREATININE 1.67* 1.86* 1.90* 1.50*  CALCIUM 9.7 9.4 9.6 9.4  MG 2.6* 2.4  --  2.6*    Recent Labs  04/11/16 0700  AST 20  ALT 12*  ALKPHOS 65  BILITOT 0.3  PROT 6.7  ALBUMIN 3.5    Recent Labs  04/11/16 0700  WBC 6.3  HGB 11.8*  HCT 37.3  MCV 93.5  PLT 212   Lab Results  Component Value Date   TSH 0.723 11/27/2015   Lab Results  Component Value Date   HGBA1C  05/17/2007    5.4 (NOTE)   The ADA recommends the following therapeutic goals for glycemic   control related to Hgb A1C measurement:   Goal  of Therapy:   < 7.0% Hgb A1C   Action Suggested:  > 8.0% Hgb A1C   Ref:  Diabetes Care, 22, Suppl. 1, 1999   Lab Results  Component Value Date   CHOL  05/22/2007    175        ATP III CLASSIFICATION:  <200     mg/dL   Desirable  200-239  mg/dL   Borderline High  >=240    mg/dL   High   HDL 37 (L) 05/22/2007   LDLCALC  05/22/2007    96        Total Cholesterol/HDL:CHD Risk Coronary Heart Disease Risk Table                     Men   Women  1/2 Average Risk   3.4   3.3   TRIG 208 (H) 05/22/2007   CHOLHDL 4.7 05/22/2007    Significant Diagnostic Results in last 30 days:  No results found.  Assessment/Plan  1 dementia-she appears actually be fairly stable with supportive care again appetite is somewhat spotty but she does drink well and takes her supplements well weight has trended up slightly.  #2 renal insufficiency this appears stable with a creatinine of 1.5 on lab done December 4.  #3 hypertension this appears relatively well controlled on Norvasc.  #4 history gastroparesis this has been quite an issue in the past but relatively stable recently no active symptoms-again her weight appears to be stable to slowly trending  up-she is on Zofran with meals-Prilosec has been DC'd per family request she appears to have suffered no ill effects from this.  #5 history of breast cancer-she had been on medication previously but this was discontinued secondary to wishes to minimize her medications.  #6 conjunctivitis-as noted above do not really see evidence of that today but will continue to monitor.  [id number]

## 2016-05-18 ENCOUNTER — Non-Acute Institutional Stay (SKILLED_NURSING_FACILITY): Payer: Medicare Other | Admitting: Internal Medicine

## 2016-05-18 ENCOUNTER — Encounter: Payer: Self-pay | Admitting: Internal Medicine

## 2016-05-18 DIAGNOSIS — N289 Disorder of kidney and ureter, unspecified: Secondary | ICD-10-CM | POA: Diagnosis not present

## 2016-05-18 DIAGNOSIS — I10 Essential (primary) hypertension: Secondary | ICD-10-CM | POA: Diagnosis not present

## 2016-05-18 DIAGNOSIS — R600 Localized edema: Secondary | ICD-10-CM | POA: Diagnosis not present

## 2016-05-18 NOTE — Progress Notes (Signed)
Location:   [place] Room Number: 110/W Place of Service:  SNF [phone]) Provider:  Clydene Fake, MD  Patient Care Team: [name], MD as PCP - General (Internal Medicine)  Extended Emergency Contact Information Primary Emergency Contact: [facility] Address: [address]          [place], [place] [address] [name] of [place] Phone: [phone] Work Phone: [phone] Mobile Phone: [phone] Relation: Daughter Secondary Emergency Contact: [name] Address: [address]          [place], [place] [address] [name] of [place] Phone: [phone] Mobile Phone: [phone] Relation: Relative  Code Status:  DNR Goals of care: Advanced Directive information Advanced Directives 05/18/2016  Does Patient Have a Medical Advance Directive? Yes  Type of Advance Directive Out of facility DNR (pink MOST or yellow form)  Does patient want to make changes to medical advance directive? No - Patient declined  Copy of [place] in Chart? -  Would patient like information on creating a medical advance directive? No - Patient declined  Pre-existing out of facility DNR order (yellow form or pink MOST form) -     Chief Complaint  Patient presents with  . Acute Visit    Swelling in both ankles    HPI:  Pt is a 81 y.o. female seen today for an acute visit for  Swelling in her ankle per her daughter. Patient has history of Hypertension, Chronic renal disease, Breast Cancer S/P Limited Mastectomy and Dementia with Behavior issues According to the daughter she had noticed some swelling around her ankles for past few weeks. The swelling does get worse as the day goes by. Patient sits in the wheel chair all day long. Patient is unable to give me any history. Her weight has been stable at 138-141 lbs. She has not noticed any Cough Chest pain or SOB.   Past Medical History:  Diagnosis Date  . Cancer ([place])   .  Dementia   . Gallstones   . Gastroparalysis   . HTN (hypertension)   . Infiltrating ductal carcinoma of right female breast ([place]) 12/02/2009   Qualifier: History of  By: [name] CMA [name]), June    . Osteoporosis 11/18/2011  . Reflux    Past Surgical History:  Procedure Laterality Date  . BREAST BIOPSY    . BREAST LUMPECTOMY    . COLONOSCOPY  2009   Dr. Fuller Plan: normal  . ESOPHAGOGASTRODUODENOSCOPY  12/11/2009   Dr. Fuller Plan: hiatal hernia  . ESOPHAGOGASTRODUODENOSCOPY N/A 01/29/2014   Procedure: ESOPHAGOGASTRODUODENOSCOPY (EGD);  Surgeon: [name], MD;  [facility];  Service: Endoscopy;  Laterality: N/A;  245  . TONSILLECTOMY AND ADENOIDECTOMY      Allergies  Allergen Reactions  . Aricept [Donepezil Hydrochloride] Nausea And Vomiting  . Exelon [Rivastigmine Tartrate]   . Lithium Nausea And Vomiting    Current Outpatient Prescriptions on File Prior to Visit  Medication Sig Dispense Refill  . acetaminophen (TYLENOL) 325 MG tablet Take one tablet by mouth at bedtime    . amLODipine (NORVASC) 5 MG tablet Take 5 mg by mouth daily.    . calcium-vitamin D (OSCAL WITH D) 500-200 MG-UNIT per tablet Take 1 tablet by mouth 2 (two) times daily.    [name] docusate (COLACE) 50 MG/5ML liquid Take 100 mg by mouth daily.    . ondansetron (ZOFRAN-ODT) 4 MG disintegrating tablet Dissolve 1 tablet in mouth 3 times daily before meals and at  bedtime for nausea    . polyethylene glycol (MIRALAX / GLYCOLAX) packet Take 17 g by mouth daily.      [name] ZINC OXIDE, TOPICAL, 10 % CREA Apply sparingly to buttocks d/t redness     No current facility-administered medications on file prior to visit.     Review of Systems  Unable to perform ROS: Dementia    Immunization History  Administered Date(s) Administered  . Influenza-Unspecified 02/13/2014, 02/09/2016   Pertinent  Health Maintenance Due  Topic Date Due  . PNA vac Low Risk Adult (1 of 2 - PCV13) 10/05/2016 (Originally 01/13/2003)  .  INFLUENZA VACCINE  Completed  . DEXA SCAN  Completed   No flowsheet data found. Functional Status Survey:    Vitals:   05/18/16 1511  BP: 139/86  Pulse: 69  Resp: 20  Temp: 98.3 F (36.8 C)  TempSrc: Oral  SpO2: 96%   There is no height or weight on file to calculate BMI. Physical Exam  Constitutional: She appears well-developed and well-nourished.  HENT:  Head: Normocephalic.  Mouth/Throat: Oropharynx is clear and moist.  Eyes: Pupils are equal, round, and reactive to light.  Cardiovascular: Normal rate, regular rhythm and normal heart sounds.   Pulmonary/Chest: Effort normal and breath sounds normal. No respiratory distress. She has no wheezes.  Abdominal: Soft. Bowel sounds are normal. She exhibits no distension. There is no tenderness.  Musculoskeletal:  Very trace edema in Both lower legs.  Lymphadenopathy:    She has no cervical adenopathy.  Neurological: She is alert.    Labs reviewed:  Recent Labs  08/07/15 0745 11/27/15 0725 12/29/15 0715 04/11/16 0700  NA 142 141 141 139  K 4.0 3.6 4.1 3.9  CL 103 104 106 103  CO2 29 28 25 27   GLUCOSE 92 83 71 89  BUN 49* 39* 35* 40*  CREATININE 1.67* 1.86* 1.90* 1.50*  CALCIUM 9.7 9.4 9.6 9.4  MG 2.6* 2.4  --  2.6*    Recent Labs  04/11/16 0700  AST 20  ALT 12*  ALKPHOS 65  BILITOT 0.3  PROT 6.7  ALBUMIN 3.5    Recent Labs  04/11/16 0700  WBC 6.3  HGB 11.8*  HCT 37.3  MCV 93.5  PLT 212   Lab Results  Component Value Date   TSH 0.723 11/27/2015   Lab Results  Component Value Date   HGBA1C  05/17/2007    5.4 (NOTE)   The ADA recommends the following therapeutic goals for glycemic   control related to Hgb A1C measurement:   Goal of Therapy:   < 7.0% Hgb A1C   Action Suggested:  > 8.0% Hgb A1C   Ref:  Diabetes Care, 22, Suppl. 1, 1999   Lab Results  Component Value Date   CHOL  05/22/2007    175        ATP III CLASSIFICATION:  <200     mg/dL   Desirable  200-239  mg/dL   Borderline High   >=240    mg/dL   High   HDL 37 (L) 05/22/2007   LDLCALC  05/22/2007    96        Total Cholesterol/HDL:CHD Risk Coronary Heart Disease Risk Table                     Men   Women  1/2 Average Risk   3.4   3.3   TRIG 208 (H) 05/22/2007   CHOLHDL 4.7 05/22/2007    Significant  Diagnostic Results in last 30 days:  No results found.  Assessment/Plan  Bilateral LE edema Can be due to Venous stasis But patient is also on Norvasc. D/W daughter Will Decrease the dose to 2.5 mg. Will check Weight 3 times a week Continue to monitor Weight. And BP More often, Do not think she needs any diuretic. Chronic renal Insufficiency Patients Creat is actually stable. Hypertension Her BP is stable. Will decrease the Norvasc. If edema improve will c if need to add something else for her BP. Family/ staff Communication:   Labs/tests ordered:  D/W Daughter will not repeat Blood work right now.

## 2016-05-23 ENCOUNTER — Encounter: Payer: Self-pay | Admitting: Internal Medicine

## 2016-05-23 ENCOUNTER — Other Ambulatory Visit (HOSPITAL_COMMUNITY)
Admission: RE | Admit: 2016-05-23 | Discharge: 2016-05-23 | Disposition: A | Discharge status: Home / Self Care | Payer: Medicare Other | Source: Skilled Nursing Facility | Attending: Internal Medicine | Admitting: Internal Medicine

## 2016-05-23 ENCOUNTER — Non-Acute Institutional Stay (SKILLED_NURSING_FACILITY): Payer: Medicare Other | Admitting: Internal Medicine

## 2016-05-23 DIAGNOSIS — I10 Essential (primary) hypertension: Secondary | ICD-10-CM | POA: Diagnosis not present

## 2016-05-23 DIAGNOSIS — R5381 Other malaise: Secondary | ICD-10-CM | POA: Diagnosis not present

## 2016-05-23 DIAGNOSIS — K3184 Gastroparesis: Secondary | ICD-10-CM | POA: Diagnosis not present

## 2016-05-23 DIAGNOSIS — R1111 Vomiting without nausea: Secondary | ICD-10-CM

## 2016-05-23 DIAGNOSIS — Z853 Personal history of malignant neoplasm of breast: Secondary | ICD-10-CM | POA: Insufficient documentation

## 2016-05-23 DIAGNOSIS — R1110 Vomiting, unspecified: Secondary | ICD-10-CM | POA: Diagnosis not present

## 2016-05-23 DIAGNOSIS — R05 Cough: Secondary | ICD-10-CM | POA: Diagnosis not present

## 2016-05-23 LAB — CBC WITH DIFFERENTIAL/PLATELET
BASOS PCT: 0 %
Basophils Absolute: 0 10*3/uL (ref 0.0–0.1)
EOS ABS: 0 10*3/uL (ref 0.0–0.7)
Eosinophils Relative: 0 %
HCT: 47.9 % — ABNORMAL HIGH (ref 36.0–46.0)
HEMOGLOBIN: 15.2 g/dL — AB (ref 12.0–15.0)
LYMPHS ABS: 1.3 10*3/uL (ref 0.7–4.0)
Lymphocytes Relative: 13 %
MCH: 29.8 pg (ref 26.0–34.0)
MCHC: 31.7 g/dL (ref 30.0–36.0)
MCV: 93.9 fL (ref 78.0–100.0)
MONOS PCT: 6 %
Monocytes Absolute: 0.6 10*3/uL (ref 0.1–1.0)
Neutro Abs: 8.1 10*3/uL — ABNORMAL HIGH (ref 1.7–7.7)
Neutrophils Relative %: 81 %
Platelets: 243 10*3/uL (ref 150–400)
RBC: 5.1 MIL/uL (ref 3.87–5.11)
RDW: 13.7 % (ref 11.5–15.5)
WBC: 10.1 10*3/uL (ref 4.0–10.5)

## 2016-05-23 NOTE — Progress Notes (Signed)
Location:   [place] Room Number: 110/W Place of Service:  SNF [phone]) Provider:  Gran[place] Lewis  [name], MD  Patient Care Team: [name], MD as PCP - General (Internal Medicine)  Extended Emergency Contact Information Primary Emergency Contact: [facility] Address: [address]          [place], [place] [address] [name] of [place] Phone: [phone] Work Phone: [phone] Mobile Phone: [phone] Relation: Daughter Secondary Emergency Contact: [name] Address: [address]          [place],  [address] [name] of [place] Phone: [phone] Mobile Phone: [phone] Relation: Relative  Code Status:  DNR Goals of care: Advanced Directive information Advanced Directives 05/23/2016  Does Patient Have a Medical Advance Directive? Yes  Type of Advance Directive Out of facility DNR (pink MOST or yellow form)  Does patient want to make changes to medical advance directive? No - Patient declined  Copy of [place] in Chart? -  Would patient like information on creating a medical advance directive? No - Patient declined  Pre-existing out of facility DNR order (yellow form or pink MOST form) -     Chief Complaint  Patient presents with  . Acute Visit    Vomiting slimy substance    HPI:  [name] is a 81 y.o. female seen today for an acute visit for Follow-up possible vomiting episode-also patient just does not appear to be feeling well.  Patient has severe dementia and cannot really give any review of systems although apparently while eating breakfast she did vomit some slimy substance.  Apparently she did take some fluids after this.  Daughter does state recently she's noticed that when she drinks she does cough at times  She does have a history of gastroparesis is on Zofran routinely 3  times a day  Review of systems is essentially unattainable secondary to dementia-her vital signs appear to  be stable except the pressure is somewhat elevated with systolic in the 637C diastolic in the 58I She is afebrile                     Past Medical History:  Diagnosis Date  . Cancer ([place])   . Dementia   . Gallstones   . Gastroparalysis   . HTN (hypertension)   . Infiltrating ductal carcinoma of right female breast ([place]) 12/02/2009   Qualifier: History of  By: [name] CMA [name]), June    . Osteoporosis 11/18/2011  . Reflux    Past Surgical History:  Procedure Laterality Date  . BREAST BIOPSY    . BREAST LUMPECTOMY    . COLONOSCOPY  2009   Dr. Fuller Plan: normal  . ESOPHAGOGASTRODUODENOSCOPY  12/11/2009   Dr. Fuller Plan: hiatal hernia  . ESOPHAGOGASTRODUODENOSCOPY N/A 01/29/2014   Procedure: ESOPHAGOGASTRODUODENOSCOPY (EGD);  Surgeon: [name], MD;  [facility];  Service: Endoscopy;  Laterality: N/A;  245  . TONSILLECTOMY AND ADENOIDECTOMY      Allergies  Allergen Reactions  . Aricept [Donepezil Hydrochloride] Nausea And Vomiting  . Exelon [Rivastigmine Tartrate]   . Lithium Nausea And Vomiting    Current Outpatient Prescriptions on File Prior to Visit  Medication Sig Dispense Refill  . acetaminophen (TYLENOL) 325 MG tablet Take one tablet by mouth at bedtime    . amLODipine (NORVASC) 2.5 MG tablet Take 2.5 mg by mouth daily.    . calcium-vitamin D (OSCAL WITH D) 500-200 MG-UNIT per tablet Take 1  tablet by mouth 2 (two) times daily.    [name] docusate (COLACE) 50 MG/5ML liquid Take 100 mg by mouth daily.    . ondansetron (ZOFRAN-ODT) 4 MG disintegrating tablet Dissolve 1 tablet in mouth 3 times daily before meals and at bedtime for nausea    . polyethylene glycol (MIRALAX / GLYCOLAX) packet Take 17 g by mouth daily.      [name] ZINC OXIDE, TOPICAL, 10 % CREA Apply sparingly to buttocks d/t redness     No current facility-administered medications on file prior to visit.     Review of Systems  Unattainable secondary to dementia please see history of present  illness  Immunization History  Administered Date(s) Administered  . Influenza-Unspecified 02/13/2014, 02/09/2016   Pertinent  Health Maintenance Due  Topic Date Due  . PNA vac Low Risk Adult (1 of 2 - PCV13) 10/05/2016 (Originally 01/13/2003)  . INFLUENZA VACCINE  Completed  . DEXA SCAN  Completed   No flowsheet data found. Functional Status Survey:    Vitals:   05/23/16 1013  BP: (!) 156/96  Pulse: 83  Resp: 20  Temp: 98.7 F (37.1 C)  TempSrc: Oral  SpO2: 95%    Physical Exam  In general this is a frail elderly female sitting in her chair she does not appear to be any acute distress but appears somewhat weaker-appears just not to be feeling well.  Her skin is warm and dry she does appear to have some erythema appears venous stasis of her distal feet bilaterally with minimal edema.  Oropharynx is clear mucous membranes appear fairly moist.  Chest is clear to auscultation with poor respiratory effort she does not really follow verbal commands today.  Heart is regular rate and rhythm without murmur gallop or rub.    Abdomen is soft does not appear to be acutely tender although she does do some grimacing with deep palpation however this could be do to routine reaction to the invasive maneuver.  GU could not really appreciate significant suprapubic tenderness although again this difficult to fully tell with her dementia.  Musculoskeletal appears able to move her extremities at baseline upper extremity strength appears quite strong which is her baseline.  Neurologic is grossly intact no lateralizing findings she's not speaking much today.  Psych she does have severe dementia as noted above   Labs reviewed:  Recent Labs  08/07/15 0745 11/27/15 0725 12/29/15 0715 04/11/16 0700  NA 142 141 141 139  K 4.0 3.6 4.1 3.9  CL 103 104 106 103  CO2 29 28 25 27   GLUCOSE 92 83 71 89  BUN 49* 39* 35* 40*  CREATININE 1.67* 1.86* 1.90* 1.50*  CALCIUM 9.7 9.4 9.6 9.4  MG  2.6* 2.4  --  2.6*    Recent Labs  04/11/16 0700  AST 20  ALT 12*  ALKPHOS 65  BILITOT 0.3  PROT 6.7  ALBUMIN 3.5    Recent Labs  04/11/16 0700  WBC 6.3  HGB 11.8*  HCT 37.3  MCV 93.5  PLT 212   Lab Results  Component Value Date   TSH 0.723 11/27/2015   Lab Results  Component Value Date   HGBA1C  05/17/2007    5.4 (NOTE)   The ADA recommends the following therapeutic goals for glycemic   control related to Hgb A1C measurement:   Goal of Therapy:   < 7.0% Hgb A1C   Action Suggested:  > 8.0% Hgb A1C   Ref:  Diabetes Care, 22, Suppl. 1,  1999   Lab Results  Component Value Date   CHOL  05/22/2007    175        ATP III CLASSIFICATION:  <200     mg/dL   Desirable  200-239  mg/dL   Borderline High  >=240    mg/dL   High   HDL 37 (L) 05/22/2007   LDLCALC  05/22/2007    96        Total Cholesterol/HDL:CHD Risk Coronary Heart Disease Risk Table                     Men   Women  1/2 Average Risk   3.4   3.3   TRIG 208 (H) 05/22/2007   CHOLHDL 4.7 05/22/2007    Significant Diagnostic Results in last 30 days:  No results found.  Assessment/Plan  #1 episode of possible vomiting-patient just does not appear to feel well-challenging situation with patient being a poor historian.  .  This was discussed with her daughter and will obtain lab work including a CBC-CMP amylase and lipase-also obtain a chest x-ray rule out aspiration as well as abdominal x-ray.  Obtaining a urine culture is quite a challenge but will await blood work to see where we stand before pursuing this.   monitor vital signs every shift.  Also will have speech therapy reevaluate her     2 hypertension--her Norvasc was recently decreased secondary to concerns that may be triggering the lower extremity edema edema appears to be stable improved-blood pressures appear somewhat variable--150s over 90s today-she see previous readings 139/86-135/72-151/88 at this point will monitor again patient not  feeling well today may be converting to the higher reading as well.    CPT [id number].      Oralia Manis, [place]

## 2016-05-23 NOTE — Progress Notes (Signed)
Location:   [facility] Room Number: 110/W Place of Service:  SNF [phone]) Provider:  Clydene Fake, MD  Patient Care Team: [name], MD as PCP - General (Internal Medicine)  Extended Emergency Contact Information Primary Emergency Contact: [facility] Address: [address]          [place], [place] [address] [name] of [place] Phone: [phone] Work Phone: [phone] Mobile Phone: [phone] Relation: Daughter Secondary Emergency Contact: [name] Address: [address]          [place], [place] [address] [name] of [place] Phone: [phone] Mobile Phone: [phone] Relation: Relative  Code Status: DNR Goals of care: Advanced Directive information Advanced Directives 05/23/2016  Does Patient Have a Medical Advance Directive? Yes  Type of Advance Directive Out of facility DNR (pink MOST or yellow form)  Does patient want to make changes to medical advance directive? No - Patient declined  Copy of [place] in Chart? -  Would patient like information on creating a medical advance directive? No - Patient declined  Pre-existing out of facility DNR order (yellow form or pink MOST form) -     Chief Complaint  Patient presents with  . Acute Visit    Vomiting up slimy substance    HPI:  Pt is a 81 y.o. female seen today for an acute visit for    Past Medical History:  Diagnosis Date  . Cancer ([place])   . Dementia   . Gallstones   . Gastroparalysis   . HTN (hypertension)   . Infiltrating ductal carcinoma of right female breast ([place]) 12/02/2009   Qualifier: History of  By: [name] CMA [name]), June    . Osteoporosis 11/18/2011  . Reflux    Past Surgical History:  Procedure Laterality Date  . BREAST BIOPSY    . BREAST LUMPECTOMY    . COLONOSCOPY  2009   Dr. Fuller Plan: normal  . ESOPHAGOGASTRODUODENOSCOPY  12/11/2009   Dr. Fuller Plan: hiatal hernia  . ESOPHAGOGASTRODUODENOSCOPY N/A  01/29/2014   Procedure: ESOPHAGOGASTRODUODENOSCOPY (EGD);  Surgeon: [name], MD;  [facility];  Service: Endoscopy;  Laterality: N/A;  245  . TONSILLECTOMY AND ADENOIDECTOMY      Allergies  Allergen Reactions  . Aricept [Donepezil Hydrochloride] Nausea And Vomiting  . Exelon [Rivastigmine Tartrate]   . Lithium Nausea And Vomiting    Current Outpatient Prescriptions on File Prior to Visit  Medication Sig Dispense Refill  . acetaminophen (TYLENOL) 325 MG tablet Take one tablet by mouth at bedtime    . calcium-vitamin D (OSCAL WITH D) 500-200 MG-UNIT per tablet Take 1 tablet by mouth 2 (two) times daily.    [name] docusate (COLACE) 50 MG/5ML liquid Take 100 mg by mouth daily.    . ondansetron (ZOFRAN-ODT) 4 MG disintegrating tablet Dissolve 1 tablet in mouth 3 times daily before meals and at bedtime for nausea    . polyethylene glycol (MIRALAX / GLYCOLAX) packet Take 17 g by mouth daily.      [name] ZINC OXIDE, TOPICAL, 10 % CREA Apply sparingly to buttocks d/t redness     No current facility-administered medications on file prior to visit.     Review of Systems  Immunization History  Administered Date(s) Administered  . Influenza-Unspecified 02/13/2014, 02/09/2016   Pertinent  Health Maintenance Due  Topic Date Due  . PNA vac Low Risk Adult (1 of 2 - PCV13) 10/05/2016 (Originally 01/13/2003)  . INFLUENZA VACCINE  Completed  .  DEXA SCAN  Completed   No flowsheet data found. Functional Status Survey:    Vitals:   05/23/16 0932  BP: (!) 156/96  Pulse: 83  Resp: 20  Temp: 98.7 F (37.1 C)  TempSrc: Oral  SpO2: 95%   There is no height or weight on file to calculate BMI. Physical Exam  Labs reviewed:  Recent Labs  08/07/15 0745 11/27/15 0725 12/29/15 0715 04/11/16 0700  NA 142 141 141 139  K 4.0 3.6 4.1 3.9  CL 103 104 106 103  CO2 29 28 25 27   GLUCOSE 92 83 71 89  BUN 49* 39* 35* 40*  CREATININE 1.67* 1.86* 1.90* 1.50*  CALCIUM 9.7 9.4 9.6 9.4  MG  2.6* 2.4  --  2.6*    Recent Labs  04/11/16 0700  AST 20  ALT 12*  ALKPHOS 65  BILITOT 0.3  PROT 6.7  ALBUMIN 3.5    Recent Labs  04/11/16 0700  WBC 6.3  HGB 11.8*  HCT 37.3  MCV 93.5  PLT 212   Lab Results  Component Value Date   TSH 0.723 11/27/2015   Lab Results  Component Value Date   HGBA1C  05/17/2007    5.4 (NOTE)   The ADA recommends the following therapeutic goals for glycemic   control related to Hgb A1C measurement:   Goal of Therapy:   < 7.0% Hgb A1C   Action Suggested:  > 8.0% Hgb A1C   Ref:  Diabetes Care, 22, Suppl. 1, 1999   Lab Results  Component Value Date   CHOL  05/22/2007    175        ATP III CLASSIFICATION:  <200     mg/dL   Desirable  200-239  mg/dL   Borderline High  >=240    mg/dL   High   HDL 37 (L) 05/22/2007   LDLCALC  05/22/2007    96        Total Cholesterol/HDL:CHD Risk Coronary Heart Disease Risk Table                     Men   Women  1/2 Average Risk   3.4   3.3   TRIG 208 (H) 05/22/2007   CHOLHDL 4.7 05/22/2007    Significant Diagnostic Results in last 30 days:  No results found.  Assessment/Plan There are no diagnoses linked to this encounter.   Family/ staff Communication:   Labs/tests ordered:     This encounter was created in error - please disregard.

## 2016-05-24 ENCOUNTER — Encounter (HOSPITAL_COMMUNITY)
Admission: RE | Admit: 2016-05-24 | Discharge: 2016-05-24 | Disposition: A | Discharge status: Home / Self Care | Payer: Medicare Other | Source: Skilled Nursing Facility | Attending: Internal Medicine | Admitting: Internal Medicine

## 2016-05-24 DIAGNOSIS — F319 Bipolar disorder, unspecified: Secondary | ICD-10-CM | POA: Diagnosis not present

## 2016-05-24 DIAGNOSIS — I10 Essential (primary) hypertension: Secondary | ICD-10-CM | POA: Diagnosis not present

## 2016-05-24 DIAGNOSIS — Z9181 History of falling: Secondary | ICD-10-CM | POA: Insufficient documentation

## 2016-05-24 DIAGNOSIS — K3184 Gastroparesis: Secondary | ICD-10-CM | POA: Diagnosis not present

## 2016-05-24 DIAGNOSIS — F0391 Unspecified dementia with behavioral disturbance: Secondary | ICD-10-CM | POA: Insufficient documentation

## 2016-05-24 DIAGNOSIS — Z853 Personal history of malignant neoplasm of breast: Secondary | ICD-10-CM | POA: Diagnosis not present

## 2016-05-24 LAB — CBC WITH DIFFERENTIAL/PLATELET
BASOS PCT: 0 %
Basophils Absolute: 0 10*3/uL (ref 0.0–0.1)
Eosinophils Absolute: 0.1 10*3/uL (ref 0.0–0.7)
Eosinophils Relative: 1 %
HEMATOCRIT: 41.6 % (ref 36.0–46.0)
Hemoglobin: 13.1 g/dL (ref 12.0–15.0)
LYMPHS ABS: 1.2 10*3/uL (ref 0.7–4.0)
LYMPHS PCT: 17 %
MCH: 29.3 pg (ref 26.0–34.0)
MCHC: 31.5 g/dL (ref 30.0–36.0)
MCV: 93.1 fL (ref 78.0–100.0)
MONO ABS: 0.4 10*3/uL (ref 0.1–1.0)
Monocytes Relative: 6 %
NEUTROS PCT: 76 %
Neutro Abs: 5.8 10*3/uL (ref 1.7–7.7)
Platelets: 223 10*3/uL (ref 150–400)
RBC: 4.47 MIL/uL (ref 3.87–5.11)
RDW: 13.6 % (ref 11.5–15.5)
WBC: 7.5 10*3/uL (ref 4.0–10.5)

## 2016-05-25 ENCOUNTER — Encounter (HOSPITAL_COMMUNITY)
Admission: RE | Admit: 2016-05-25 | Discharge: 2016-05-25 | Disposition: A | Discharge status: Home / Self Care | Payer: Medicare Other | Source: Skilled Nursing Facility | Attending: Internal Medicine | Admitting: Internal Medicine

## 2016-05-25 DIAGNOSIS — F0391 Unspecified dementia with behavioral disturbance: Secondary | ICD-10-CM | POA: Diagnosis not present

## 2016-05-25 DIAGNOSIS — F319 Bipolar disorder, unspecified: Secondary | ICD-10-CM | POA: Diagnosis not present

## 2016-05-25 DIAGNOSIS — Z9181 History of falling: Secondary | ICD-10-CM | POA: Diagnosis not present

## 2016-05-25 DIAGNOSIS — K3184 Gastroparesis: Secondary | ICD-10-CM | POA: Diagnosis not present

## 2016-05-25 DIAGNOSIS — Z853 Personal history of malignant neoplasm of breast: Secondary | ICD-10-CM | POA: Diagnosis not present

## 2016-05-25 DIAGNOSIS — I10 Essential (primary) hypertension: Secondary | ICD-10-CM | POA: Diagnosis not present

## 2016-05-25 LAB — COMPREHENSIVE METABOLIC PANEL
ALK PHOS: 70 U/L (ref 38–126)
ALT: 19 U/L (ref 14–54)
AST: 25 U/L (ref 15–41)
Albumin: 3.9 g/dL (ref 3.5–5.0)
Anion gap: 9 (ref 5–15)
BILIRUBIN TOTAL: 0.7 mg/dL (ref 0.3–1.2)
BUN: 33 mg/dL — ABNORMAL HIGH (ref 6–20)
CALCIUM: 9.4 mg/dL (ref 8.9–10.3)
CO2: 28 mmol/L (ref 22–32)
Chloride: 102 mmol/L (ref 101–111)
Creatinine, Ser: 1.71 mg/dL — ABNORMAL HIGH (ref 0.44–1.00)
GFR calc non Af Amer: 27 mL/min — ABNORMAL LOW (ref >60)
GFR, EST AFRICAN AMERICAN: 32 mL/min — AB (ref >60)
GLUCOSE: 157 mg/dL — AB (ref 65–99)
Potassium: 2.9 mmol/L — ABNORMAL LOW (ref 3.5–5.1)
Sodium: 139 mmol/L (ref 135–145)
TOTAL PROTEIN: 7.3 g/dL (ref 6.5–8.1)

## 2016-05-25 LAB — AMYLASE: Amylase: 133 U/L — ABNORMAL HIGH (ref 28–100)

## 2016-05-25 LAB — LIPASE, BLOOD: LIPASE: 31 U/L (ref 11–51)

## 2016-05-26 ENCOUNTER — Encounter: Payer: Self-pay | Admitting: Internal Medicine

## 2016-05-26 ENCOUNTER — Encounter (HOSPITAL_COMMUNITY)
Admission: RE | Admit: 2016-05-26 | Discharge: 2016-05-26 | Disposition: A | Discharge status: Home / Self Care | Payer: Medicare Other | Source: Skilled Nursing Facility | Attending: *Deleted | Admitting: *Deleted

## 2016-05-26 ENCOUNTER — Non-Acute Institutional Stay (SKILLED_NURSING_FACILITY): Payer: Medicare Other | Admitting: Internal Medicine

## 2016-05-26 DIAGNOSIS — R112 Nausea with vomiting, unspecified: Secondary | ICD-10-CM | POA: Diagnosis not present

## 2016-05-26 DIAGNOSIS — K219 Gastro-esophageal reflux disease without esophagitis: Secondary | ICD-10-CM | POA: Diagnosis not present

## 2016-05-26 DIAGNOSIS — Z9181 History of falling: Secondary | ICD-10-CM | POA: Diagnosis not present

## 2016-05-26 DIAGNOSIS — G309 Alzheimer's disease, unspecified: Secondary | ICD-10-CM

## 2016-05-26 DIAGNOSIS — Z853 Personal history of malignant neoplasm of breast: Secondary | ICD-10-CM | POA: Diagnosis not present

## 2016-05-26 DIAGNOSIS — K3184 Gastroparesis: Secondary | ICD-10-CM

## 2016-05-26 DIAGNOSIS — G308 Other Alzheimer's disease: Secondary | ICD-10-CM | POA: Diagnosis not present

## 2016-05-26 DIAGNOSIS — I10 Essential (primary) hypertension: Secondary | ICD-10-CM

## 2016-05-26 DIAGNOSIS — F0281 Dementia in other diseases classified elsewhere with behavioral disturbance: Secondary | ICD-10-CM | POA: Diagnosis not present

## 2016-05-26 DIAGNOSIS — F319 Bipolar disorder, unspecified: Secondary | ICD-10-CM | POA: Diagnosis not present

## 2016-05-26 DIAGNOSIS — F0391 Unspecified dementia with behavioral disturbance: Secondary | ICD-10-CM | POA: Diagnosis not present

## 2016-05-26 LAB — BASIC METABOLIC PANEL
Anion gap: 12 (ref 5–15)
BUN: 35 mg/dL — AB (ref 6–20)
CO2: 27 mmol/L (ref 22–32)
Calcium: 9.8 mg/dL (ref 8.9–10.3)
Chloride: 104 mmol/L (ref 101–111)
Creatinine, Ser: 1.56 mg/dL — ABNORMAL HIGH (ref 0.44–1.00)
GFR, EST AFRICAN AMERICAN: 36 mL/min — AB (ref >60)
GFR, EST NON AFRICAN AMERICAN: 31 mL/min — AB (ref >60)
Glucose, Bld: 106 mg/dL — ABNORMAL HIGH (ref 65–99)
POTASSIUM: 3.4 mmol/L — AB (ref 3.5–5.1)
Sodium: 143 mmol/L (ref 135–145)

## 2016-05-26 NOTE — Progress Notes (Signed)
Location:   [facility] Room Number: 110/W Place of Service:  SNF [phone]) Provider:  Clydene Fake, MD  Patient Care Team: [name], MD as PCP - General (Internal Medicine)  Extended Emergency Contact Information Primary Emergency Contact: [facility] Address: [address]          [place], [place] [address] [name] of [place] Phone: [phone] Work Phone: [phone] Mobile Phone: [phone] Relation: Daughter Secondary Emergency Contact: [name] Address: [address]          [place], [place] [address] [name] of [place] Phone: [phone] Mobile Phone: [phone] Relation: Relative  Code Status:  DNR Goals of care: Advanced Directive information Advanced Directives 05/26/2016  Does Patient Have a Medical Advance Directive? Yes  Type of Advance Directive Out of facility DNR (pink MOST or yellow form)  Does patient want to make changes to medical advance directive? No - Patient declined  Copy of [place] in Chart? -  Would patient like information on creating a medical advance directive? No - Patient declined  Pre-existing out of facility DNR order (yellow form or pink MOST form) -     Chief Complaint  Patient presents with  . Acute Visit    Low Potassium And Nausea vomiting    HPI:  Pt is a 81 y.o. female seen today for an acute visit for Continuous nausea and vomiting in the morning.  Patient has history of Hypertension, Chronic renal disease, Breast Cancer S/P Limited Mastectomy and Dementia with Behavior issues.  Patient initially had some swelling in her ankles as noticed by her daughter. Her Norvasc was decreased to 2.5 mg. Her BP has been stable but for past few days patient had some nausea and vomiting just only in the morning. Patient is unable to give any history due to her dementia.  But Per daughter she has not noticed anything any other time of day. It does not look  like patient has any abdominal pain. She does have h/o Gatroparesis and is on Chronic Zofran. She was on Prilosec long time ago but is not on anything now for reflux. Patient does have poor appetite but per Daughter that is not new. Her weight is stable at 138-140 lbs. No fever or chills noticed.   Past Medical History:  Diagnosis Date  . Cancer ([place])   . Dementia   . Gallstones   . Gastroparalysis   . HTN (hypertension)   . Infiltrating ductal carcinoma of right female breast ([place]) 12/02/2009   Qualifier: History of  By: [name] CMA [name]), June    . Osteoporosis 11/18/2011  . Reflux    Past Surgical History:  Procedure Laterality Date  . BREAST BIOPSY    . BREAST LUMPECTOMY    . COLONOSCOPY  2009   Dr. Fuller Plan: normal  . ESOPHAGOGASTRODUODENOSCOPY  12/11/2009   Dr. Fuller Plan: hiatal hernia  . ESOPHAGOGASTRODUODENOSCOPY N/A 01/29/2014   Procedure: ESOPHAGOGASTRODUODENOSCOPY (EGD);  Surgeon: [name], MD;  [facility];  Service: Endoscopy;  Laterality: N/A;  245  . TONSILLECTOMY AND ADENOIDECTOMY      Allergies  Allergen Reactions  . Aricept [Donepezil Hydrochloride] Nausea And Vomiting  . Exelon [Rivastigmine Tartrate]   . Lithium Nausea And Vomiting    Current Outpatient Prescriptions on File Prior to Visit  Medication Sig Dispense Refill  . acetaminophen (TYLENOL) 325 MG tablet Take one tablet by mouth at bedtime    . amLODipine (NORVASC) 2.5 MG  tablet Take 2.5 mg by mouth daily.    [name] docusate (COLACE) 50 MG/5ML liquid Take 100 mg by mouth daily.    . ondansetron (ZOFRAN-ODT) 4 MG disintegrating tablet Dissolve 1 tablet in mouth 3 times daily before meals and at bedtime for nausea    . polyethylene glycol (MIRALAX / GLYCOLAX) packet Take 17 g by mouth daily.      [name] ZINC OXIDE, TOPICAL, 10 % CREA Apply sparingly to buttocks d/t redness     No current facility-administered medications on file prior to visit.     Review of Systems  Reason unable to perform  ROS: Patient is unable to give any history due to dementia.    Immunization History  Administered Date(s) Administered  . Influenza-Unspecified 02/13/2014, 02/09/2016   Pertinent  Health Maintenance Due  Topic Date Due  . PNA vac Low Risk Adult (1 of 2 - PCV13) 10/05/2016 (Originally 01/13/2003)  . INFLUENZA VACCINE  Completed  . DEXA SCAN  Completed   No flowsheet data found. Functional Status Survey:    Vitals:   05/26/16 1254  BP: 138/82  Pulse: 64  Resp: 20  Temp: 98.6 F (37 C)  TempSrc: Oral  SpO2: 100%   There is no height or weight on file to calculate BMI. Physical Exam  Constitutional: She appears well-developed and well-nourished.  HENT:  Head: Normocephalic.  Mouth/Throat: Oropharynx is clear and moist.  Eyes: Pupils are equal, round, and reactive to light.  Neck: Neck supple.  Cardiovascular: Normal rate, regular rhythm and normal heart sounds.   Pulmonary/Chest: Effort normal and breath sounds normal. No respiratory distress. She has no wheezes. She has no rales.  Abdominal: Soft. Bowel sounds are normal. She exhibits no distension. There is no tenderness. There is no rebound.  Musculoskeletal: She exhibits no edema.  Neurological: She is alert.  Skin: Skin is warm and dry.  Psychiatric: She has a normal mood and affect. Her behavior is normal.    Labs reviewed:  Recent Labs  08/07/15 0745 11/27/15 0725  04/11/16 0700 05/25/16 0500 05/26/16 0800  NA 142 141  < > 139 139 143  K 4.0 3.6  < > 3.9 2.9* 3.4*  CL 103 104  < > 103 102 104  CO2 29 28  < > 27 28 27   GLUCOSE 92 83  < > 89 157* 106*  BUN 49* 39*  < > 40* 33* 35*  CREATININE 1.67* 1.86*  < > 1.50* 1.71* 1.56*  CALCIUM 9.7 9.4  < > 9.4 9.4 9.8  MG 2.6* 2.4  --  2.6*  --   --   < > = values in this interval not displayed.  Recent Labs  04/11/16 0700 05/25/16 0500  AST 20 25  ALT 12* 19  ALKPHOS 65 70  BILITOT 0.3 0.7  PROT 6.7 7.3  ALBUMIN 3.5 3.9    Recent Labs   04/11/16 0700 05/23/16 1408 05/24/16 0500  WBC 6.3 10.1 7.5  NEUTROABS  --  8.1* 5.8  HGB 11.8* 15.2* 13.1  HCT 37.3 47.9* 41.6  MCV 93.5 93.9 93.1  PLT 212 243 223   Lab Results  Component Value Date   TSH 0.723 11/27/2015   Lab Results  Component Value Date   HGBA1C  05/17/2007    5.4 (NOTE)   The ADA recommends the following therapeutic goals for glycemic   control related to Hgb A1C measurement:   Goal of Therapy:   < 7.0% Hgb A1C  Action Suggested:  > 8.0% Hgb A1C   Ref:  Diabetes Care, 22, Suppl. 1, 1999   Lab Results  Component Value Date   CHOL  05/22/2007    175        ATP III CLASSIFICATION:  <200     mg/dL   Desirable  200-239  mg/dL   Borderline High  >=240    mg/dL   High   HDL 37 (L) 05/22/2007   LDLCALC  05/22/2007    96        Total Cholesterol/HDL:CHD Risk Coronary Heart Disease Risk Table                     Men   Women  1/2 Average Risk   3.4   3.3   TRIG 208 (H) 05/22/2007   CHOLHDL 4.7 05/22/2007    Significant Diagnostic Results in last 30 days:  No results found.  Assessment/Plan Nausea Vomiting Possible GERD and Gastroparesis. Will start patient on Protonix BID for few weeks to treat GERD. Continue Zofran. Her BUN is actually better.  D/W daughter. Will continue Conservative therapy right now.  Her KUB done few days ago did not show any acute process.  Hypokalemia On Potassium supplement Repeat BMP in 1 week. Family/ staff Communication:   Labs/tests ordered:

## 2016-05-27 ENCOUNTER — Encounter (HOSPITAL_COMMUNITY)
Admission: RE | Admit: 2016-05-27 | Discharge: 2016-05-27 | Disposition: A | Discharge status: Home / Self Care | Payer: Medicare Other | Source: Skilled Nursing Facility | Attending: Internal Medicine | Admitting: Internal Medicine

## 2016-05-27 DIAGNOSIS — Z9181 History of falling: Secondary | ICD-10-CM | POA: Diagnosis not present

## 2016-05-27 DIAGNOSIS — F0391 Unspecified dementia with behavioral disturbance: Secondary | ICD-10-CM | POA: Diagnosis not present

## 2016-05-27 DIAGNOSIS — F319 Bipolar disorder, unspecified: Secondary | ICD-10-CM | POA: Diagnosis not present

## 2016-05-27 DIAGNOSIS — Z853 Personal history of malignant neoplasm of breast: Secondary | ICD-10-CM | POA: Diagnosis not present

## 2016-05-27 DIAGNOSIS — I10 Essential (primary) hypertension: Secondary | ICD-10-CM | POA: Diagnosis not present

## 2016-05-27 DIAGNOSIS — K3184 Gastroparesis: Secondary | ICD-10-CM | POA: Diagnosis not present

## 2016-05-27 LAB — BASIC METABOLIC PANEL WITH GFR
Anion gap: 7 (ref 5–15)
BUN: 38 mg/dL — ABNORMAL HIGH (ref 6–20)
CO2: 30 mmol/L (ref 22–32)
Calcium: 9.5 mg/dL (ref 8.9–10.3)
Chloride: 104 mmol/L (ref 101–111)
Creatinine, Ser: 1.62 mg/dL — ABNORMAL HIGH (ref 0.44–1.00)
GFR calc Af Amer: 34 mL/min — ABNORMAL LOW
GFR calc non Af Amer: 29 mL/min — ABNORMAL LOW
Glucose, Bld: 98 mg/dL (ref 65–99)
Potassium: 3.9 mmol/L (ref 3.5–5.1)
Sodium: 141 mmol/L (ref 135–145)

## 2016-05-30 ENCOUNTER — Encounter (HOSPITAL_COMMUNITY)
Admission: RE | Admit: 2016-05-30 | Discharge: 2016-05-30 | Disposition: A | Discharge status: Home / Self Care | Payer: Medicare Other | Source: Skilled Nursing Facility | Attending: Internal Medicine | Admitting: Internal Medicine

## 2016-05-30 DIAGNOSIS — K3184 Gastroparesis: Secondary | ICD-10-CM | POA: Insufficient documentation

## 2016-05-30 DIAGNOSIS — Z9181 History of falling: Secondary | ICD-10-CM | POA: Diagnosis not present

## 2016-05-30 DIAGNOSIS — F0391 Unspecified dementia with behavioral disturbance: Secondary | ICD-10-CM | POA: Insufficient documentation

## 2016-05-30 DIAGNOSIS — F319 Bipolar disorder, unspecified: Secondary | ICD-10-CM | POA: Insufficient documentation

## 2016-05-30 DIAGNOSIS — Z853 Personal history of malignant neoplasm of breast: Secondary | ICD-10-CM | POA: Diagnosis not present

## 2016-05-30 DIAGNOSIS — I10 Essential (primary) hypertension: Secondary | ICD-10-CM | POA: Insufficient documentation

## 2016-05-30 LAB — BASIC METABOLIC PANEL
Anion gap: 10 (ref 5–15)
BUN: 34 mg/dL — ABNORMAL HIGH (ref 6–20)
CHLORIDE: 101 mmol/L (ref 101–111)
CO2: 28 mmol/L (ref 22–32)
Calcium: 9.4 mg/dL (ref 8.9–10.3)
Creatinine, Ser: 1.67 mg/dL — ABNORMAL HIGH (ref 0.44–1.00)
GFR calc non Af Amer: 28 mL/min — ABNORMAL LOW (ref >60)
GFR, EST AFRICAN AMERICAN: 33 mL/min — AB (ref >60)
Glucose, Bld: 91 mg/dL (ref 65–99)
POTASSIUM: 3.1 mmol/L — AB (ref 3.5–5.1)
SODIUM: 139 mmol/L (ref 135–145)

## 2016-06-01 ENCOUNTER — Encounter: Payer: Self-pay | Admitting: Internal Medicine

## 2016-06-01 ENCOUNTER — Non-Acute Institutional Stay (SKILLED_NURSING_FACILITY): Payer: Medicare Other | Admitting: Internal Medicine

## 2016-06-01 DIAGNOSIS — E875 Hyperkalemia: Secondary | ICD-10-CM | POA: Diagnosis not present

## 2016-06-01 DIAGNOSIS — R112 Nausea with vomiting, unspecified: Secondary | ICD-10-CM | POA: Diagnosis not present

## 2016-06-01 DIAGNOSIS — I10 Essential (primary) hypertension: Secondary | ICD-10-CM | POA: Diagnosis not present

## 2016-06-01 NOTE — Progress Notes (Signed)
Location:   [facility] Room Number: 110/W Place of Service:  SNF [phone]) Provider:  [place]  [name], MD  Patient Care Team: [name], MD as PCP - General (Internal Medicine)  Extended Emergency Contact Information Primary Emergency Contact: [facility] Address: [address]          [place], [place] [address] [name] of [place] Phone: [phone] Work Phone: [phone] Mobile Phone: [phone] Relation: Daughter Secondary Emergency Contact: [name] Address: [address]          [place], [place] [address] [name] of [place] Phone: [phone] Mobile Phone: [phone] Relation: Relative  Code Status:  DNR Goals of care: Advanced Directive information Advanced Directives 06/01/2016  Does Patient Have a Medical Advance Directive? Yes  Type of Advance Directive Out of facility DNR (pink MOST or yellow form)  Does patient want to make changes to medical advance directive? No - Patient declined  Copy of [place] in Chart? -  Would patient like information on creating a medical advance directive? No - Patient declined  Pre-existing out of facility DNR order (yellow form or pink MOST form) -     Chief Complaint  Patient presents with  . Acute Visit    Nausea+ Vomiting    HPI:  Pt is a 81 y.o. female seen today for an acute visit for Follow-up of nausea and vomiting. She was seen last week as well for some a.m. nausea and vomiting.  Apparently this is more so only in the morning.  She does have a history of gastroparesis is on chronic Zofran also was started on a proton pump inhibitor twice a day last week she is on Prilosec original order was for proton aches but this cannot be crushed and so it was switched to Prilosec.  She is afebrile her blood pressure is elevated in fact we checked it manually we got 160/102 and this was comparable in both arms.  She is on Norvasc 2.5 mg a day-this  was recently decreased secondary to concerns of possible some leg swelling.  It does not appear she has significant leg swelling however today.  Patient is a poor historiansecondary to dementia but she does not appear to be complaining of any shortness of breath or distress at this time.       Past Medical History:  Diagnosis Date  . Cancer ([place])   . Dementia   . Gallstones   . Gastroparalysis   . HTN (hypertension)   . Infiltrating ductal carcinoma of right female breast ([place]) 12/02/2009   Qualifier: History of  By: [name] CMA [name]), June    . Osteoporosis 11/18/2011  . Reflux    Past Surgical History:  Procedure Laterality Date  . BREAST BIOPSY    . BREAST LUMPECTOMY    . COLONOSCOPY  2009   Dr. Fuller Plan: normal  . ESOPHAGOGASTRODUODENOSCOPY  12/11/2009   Dr. Fuller Plan: hiatal hernia  . ESOPHAGOGASTRODUODENOSCOPY N/A 01/29/2014   Procedure: ESOPHAGOGASTRODUODENOSCOPY (EGD);  Surgeon: [name], MD;  [facility];  Service: Endoscopy;  Laterality: N/A;  245  . TONSILLECTOMY AND ADENOIDECTOMY      Allergies  Allergen Reactions  . Aricept [Donepezil Hydrochloride] Nausea And Vomiting  . Exelon [Rivastigmine Tartrate]   . Lithium Nausea And Vomiting    Current Outpatient Prescriptions on File Prior to Visit  Medication Sig Dispense Refill  . acetaminophen (TYLENOL) 325 MG tablet Take one tablet by mouth at bedtime    .  amLODipine (NORVASC) 2.5 MG tablet Take 2.5 mg by mouth daily.    [name] docusate (COLACE) 50 MG/5ML liquid Take 100 mg by mouth daily.    . ondansetron (ZOFRAN-ODT) 4 MG disintegrating tablet Dissolve 1 tablet in mouth 3 times daily before meals and at bedtime for nausea    . polyethylene glycol (MIRALAX / GLYCOLAX) packet Take 17 g by mouth daily.      . potassium chloride (K-DUR,KLOR-CON) 10 MEQ tablet Take 40 mEq by mouth daily.     [name] ZINC OXIDE, TOPICAL, 10 % CREA Apply sparingly to buttocks d/t redness     No current facility-administered  medications on file prior to visit.      Review of Systems   This is essentially unattainable secondary to dementia please see history of present illness  Immunization History  Administered Date(s) Administered  . Influenza-Unspecified 02/13/2014, 02/09/2016   Pertinent  Health Maintenance Due  Topic Date Due  . PNA vac Low Risk Adult (1 of 2 - PCV13) 10/05/2016 (Originally 01/13/2003)  . INFLUENZA VACCINE  Completed  . DEXA SCAN  Completed   No flowsheet data found. Functional Status Survey:    Vitals:   06/01/16 1003  BP: (!) 152/96  Pulse: 67  Resp: 20  Temp: 97.2 F (36.2 C)  TempSrc: Oral  SpO2: 99%  On recheck manually blood pressure was 160/102  Physical Exam  In general this is a somewhat frail elderly female in no distress sitting up in bed.  Her skin is warm and dry she does have some chronic bruising which appears relatively unchanged.  Eyes appear clear visual acuity appears grossly intact.  Oropharynx was difficult to assess since patient held her mouth tightly shut.  Chest poor respiratory effort but there is no labored breathing I could not appreciate any overt congestion.  Heart is regular rate and rhythm slightly bradycardic at 60 she does not really have significant lower extremity edema.  Her abdomen is somewhat protuberant at baseline it is soft it does not appear to be acutely tender she does grimace somewhat whenever her abdomen is palpated this is chronic-but I do not really note any sign that this has increased from her baseline--bowel sounds are positive.  Neurologic is grossly intact she is alert moves all extremities appears at baseline somewhat agitated with exam which is baseline.  Psych she does have findings consistent with significant dementia   Labs reviewed:  Recent Labs  08/07/15 0745 11/27/15 0725  04/11/16 0700  05/26/16 0800 05/27/16 0700 05/30/16 0700  NA 142 141  < > 139  < > 143 141 139  K 4.0 3.6  < > 3.9  < >  3.4* 3.9 3.1*  CL 103 104  < > 103  < > 104 104 101  CO2 29 28  < > 27  < > 27 30 28   GLUCOSE 92 83  < > 89  < > 106* 98 91  BUN 49* 39*  < > 40*  < > 35* 38* 34*  CREATININE 1.67* 1.86*  < > 1.50*  < > 1.56* 1.62* 1.67*  CALCIUM 9.7 9.4  < > 9.4  < > 9.8 9.5 9.4  MG 2.6* 2.4  --  2.6*  --   --   --   --   < > = values in this interval not displayed.  Recent Labs  04/11/16 0700 05/25/16 0500  AST 20 25  ALT 12* 19  ALKPHOS 65 70  BILITOT 0.3  0.7  PROT 6.7 7.3  ALBUMIN 3.5 3.9    Recent Labs  04/11/16 0700 05/23/16 1408 05/24/16 0500  WBC 6.3 10.1 7.5  NEUTROABS  --  8.1* 5.8  HGB 11.8* 15.2* 13.1  HCT 37.3 47.9* 41.6  MCV 93.5 93.9 93.1  PLT 212 243 223   Lab Results  Component Value Date   TSH 0.723 11/27/2015   Lab Results  Component Value Date   HGBA1C  05/17/2007    5.4 (NOTE)   The ADA recommends the following therapeutic goals for glycemic   control related to Hgb A1C measurement:   Goal of Therapy:   < 7.0% Hgb A1C   Action Suggested:  > 8.0% Hgb A1C   Ref:  Diabetes Care, 22, Suppl. 1, 1999   Lab Results  Component Value Date   CHOL  05/22/2007    175        ATP III CLASSIFICATION:  <200     mg/dL   Desirable  200-239  mg/dL   Borderline High  >=240    mg/dL   High   HDL 37 (L) 05/22/2007   LDLCALC  05/22/2007    96        Total Cholesterol/HDL:CHD Risk Coronary Heart Disease Risk Table                     Men   Women  1/2 Average Risk   3.4   3.3   TRIG 208 (H) 05/22/2007   CHOLHDL 4.7 05/22/2007    Significant Diagnostic Results in last 30 days:  No results found.  Assessment/Plan  #1 nausea and vomiting-this appears to be more in the morning-her proton pump inhibitor was started at twice a day-there is some question possibly about her trying to drink may be a bit too much of the supplements in the morning-speech therapy is evaluating this-about her tolerance level here Also will obtain a urinalysis and culture when patient will  allow.  #2 hypertension-her Norvasc was decreased but her blood pressures appear to be elevated here will give her clonidine 0.1 mg now and recheck blood pressure in 1 hour and in 2 hours-also will increase her Norvasc up to 5 mg a day and monitor for any leg swelling-. Blood pressures previou  To today appeared to be systolically in the 425Z to 160s  #3 hypokalemia patient's potassium was 3.1 on January 22 she was on 20 mEq of potassium she was given an extra 40 mEq and then her dose was increased as of yesterday to 40 mEq a day update BMP was ordered for today but apparently lab was not drawn this will have to be done tomorrow-we have been trying to minimize her lab draws secondary to patient's bruising and family wishes.    Addendum-patient was rechecked approximately an hour later and blood pressure had come down to 140/70-she is sitting up in her wheelchair she is bright alert appears to be feeling a bit better I do not see any sign of distress at this point will continue to monitor   [place], [place], [place]

## 2016-06-02 ENCOUNTER — Encounter (HOSPITAL_COMMUNITY)
Admission: RE | Admit: 2016-06-02 | Discharge: 2016-06-02 | Disposition: A | Discharge status: Home / Self Care | Payer: Medicare Other | Source: Skilled Nursing Facility | Attending: Internal Medicine | Admitting: Internal Medicine

## 2016-06-02 DIAGNOSIS — Z853 Personal history of malignant neoplasm of breast: Secondary | ICD-10-CM | POA: Diagnosis not present

## 2016-06-02 DIAGNOSIS — K3184 Gastroparesis: Secondary | ICD-10-CM | POA: Diagnosis not present

## 2016-06-02 DIAGNOSIS — F0391 Unspecified dementia with behavioral disturbance: Secondary | ICD-10-CM | POA: Insufficient documentation

## 2016-06-02 DIAGNOSIS — I10 Essential (primary) hypertension: Secondary | ICD-10-CM | POA: Diagnosis not present

## 2016-06-02 DIAGNOSIS — F319 Bipolar disorder, unspecified: Secondary | ICD-10-CM | POA: Diagnosis not present

## 2016-06-02 DIAGNOSIS — Z9181 History of falling: Secondary | ICD-10-CM | POA: Insufficient documentation

## 2016-06-02 LAB — BASIC METABOLIC PANEL
ANION GAP: 10 (ref 5–15)
BUN: 37 mg/dL — AB (ref 6–20)
CO2: 28 mmol/L (ref 22–32)
CREATININE: 1.53 mg/dL — AB (ref 0.44–1.00)
Calcium: 9.6 mg/dL (ref 8.9–10.3)
Chloride: 105 mmol/L (ref 101–111)
GFR calc non Af Amer: 31 mL/min — ABNORMAL LOW (ref >60)
GFR, EST AFRICAN AMERICAN: 36 mL/min — AB (ref >60)
Glucose, Bld: 101 mg/dL — ABNORMAL HIGH (ref 65–99)
Potassium: 3.6 mmol/L (ref 3.5–5.1)
Sodium: 143 mmol/L (ref 135–145)

## 2016-06-02 LAB — MAGNESIUM: MAGNESIUM: 2.6 mg/dL — AB (ref 1.7–2.4)

## 2016-06-06 DIAGNOSIS — F0391 Unspecified dementia with behavioral disturbance: Secondary | ICD-10-CM | POA: Diagnosis not present

## 2016-06-06 DIAGNOSIS — R1311 Dysphagia, oral phase: Secondary | ICD-10-CM | POA: Diagnosis not present

## 2016-06-06 DIAGNOSIS — R112 Nausea with vomiting, unspecified: Secondary | ICD-10-CM | POA: Diagnosis not present

## 2016-06-07 ENCOUNTER — Encounter (HOSPITAL_COMMUNITY)
Admission: RE | Admit: 2016-06-07 | Discharge: 2016-06-07 | Disposition: A | Discharge status: Home / Self Care | Payer: Medicare Other | Source: Skilled Nursing Facility | Attending: Internal Medicine | Admitting: Internal Medicine

## 2016-06-07 DIAGNOSIS — R509 Fever, unspecified: Secondary | ICD-10-CM | POA: Diagnosis not present

## 2016-06-07 DIAGNOSIS — J101 Influenza due to other identified influenza virus with other respiratory manifestations: Secondary | ICD-10-CM | POA: Diagnosis not present

## 2016-06-07 DIAGNOSIS — Z9181 History of falling: Secondary | ICD-10-CM | POA: Insufficient documentation

## 2016-06-07 DIAGNOSIS — F0391 Unspecified dementia with behavioral disturbance: Secondary | ICD-10-CM | POA: Insufficient documentation

## 2016-06-07 DIAGNOSIS — K3184 Gastroparesis: Secondary | ICD-10-CM | POA: Insufficient documentation

## 2016-06-07 DIAGNOSIS — I10 Essential (primary) hypertension: Secondary | ICD-10-CM | POA: Insufficient documentation

## 2016-06-07 DIAGNOSIS — E43 Unspecified severe protein-calorie malnutrition: Secondary | ICD-10-CM | POA: Diagnosis not present

## 2016-06-07 DIAGNOSIS — R112 Nausea with vomiting, unspecified: Secondary | ICD-10-CM | POA: Diagnosis not present

## 2016-06-07 DIAGNOSIS — F319 Bipolar disorder, unspecified: Secondary | ICD-10-CM | POA: Insufficient documentation

## 2016-06-07 DIAGNOSIS — E860 Dehydration: Secondary | ICD-10-CM | POA: Diagnosis not present

## 2016-06-07 DIAGNOSIS — R531 Weakness: Secondary | ICD-10-CM | POA: Diagnosis not present

## 2016-06-07 DIAGNOSIS — Z853 Personal history of malignant neoplasm of breast: Secondary | ICD-10-CM | POA: Insufficient documentation

## 2016-06-07 DIAGNOSIS — E870 Hyperosmolality and hypernatremia: Secondary | ICD-10-CM | POA: Diagnosis not present

## 2016-06-07 DIAGNOSIS — N390 Urinary tract infection, site not specified: Secondary | ICD-10-CM | POA: Diagnosis not present

## 2016-06-07 DIAGNOSIS — R1311 Dysphagia, oral phase: Secondary | ICD-10-CM | POA: Diagnosis not present

## 2016-06-07 DIAGNOSIS — R410 Disorientation, unspecified: Secondary | ICD-10-CM | POA: Diagnosis not present

## 2016-06-07 LAB — BASIC METABOLIC PANEL
Anion gap: 8 (ref 5–15)
BUN: 40 mg/dL — ABNORMAL HIGH (ref 6–20)
CALCIUM: 9.4 mg/dL (ref 8.9–10.3)
CO2: 29 mmol/L (ref 22–32)
CREATININE: 1.67 mg/dL — AB (ref 0.44–1.00)
Chloride: 103 mmol/L (ref 101–111)
GFR calc non Af Amer: 28 mL/min — ABNORMAL LOW (ref >60)
GFR, EST AFRICAN AMERICAN: 33 mL/min — AB (ref >60)
Glucose, Bld: 87 mg/dL (ref 65–99)
Potassium: 3.4 mmol/L — ABNORMAL LOW (ref 3.5–5.1)
SODIUM: 140 mmol/L (ref 135–145)

## 2016-06-08 DIAGNOSIS — R1311 Dysphagia, oral phase: Secondary | ICD-10-CM | POA: Diagnosis not present

## 2016-06-08 DIAGNOSIS — F0391 Unspecified dementia with behavioral disturbance: Secondary | ICD-10-CM | POA: Diagnosis not present

## 2016-06-08 DIAGNOSIS — R112 Nausea with vomiting, unspecified: Secondary | ICD-10-CM | POA: Diagnosis not present

## 2016-06-09 ENCOUNTER — Encounter (HOSPITAL_COMMUNITY)
Admission: RE | Admit: 2016-06-09 | Discharge: 2016-06-09 | Disposition: A | Discharge status: Home / Self Care | Payer: Medicare Other | Source: Skilled Nursing Facility | Attending: Internal Medicine | Admitting: Internal Medicine

## 2016-06-09 ENCOUNTER — Encounter: Payer: Self-pay | Admitting: Internal Medicine

## 2016-06-09 ENCOUNTER — Non-Acute Institutional Stay (SKILLED_NURSING_FACILITY): Payer: Medicare Other | Admitting: Internal Medicine

## 2016-06-09 DIAGNOSIS — R112 Nausea with vomiting, unspecified: Secondary | ICD-10-CM

## 2016-06-09 DIAGNOSIS — F0391 Unspecified dementia with behavioral disturbance: Secondary | ICD-10-CM | POA: Insufficient documentation

## 2016-06-09 DIAGNOSIS — R1314 Dysphagia, pharyngoesophageal phase: Secondary | ICD-10-CM

## 2016-06-09 DIAGNOSIS — K3184 Gastroparesis: Secondary | ICD-10-CM

## 2016-06-09 DIAGNOSIS — R1311 Dysphagia, oral phase: Secondary | ICD-10-CM | POA: Diagnosis not present

## 2016-06-09 DIAGNOSIS — I10 Essential (primary) hypertension: Secondary | ICD-10-CM | POA: Diagnosis not present

## 2016-06-09 DIAGNOSIS — Z853 Personal history of malignant neoplasm of breast: Secondary | ICD-10-CM | POA: Diagnosis not present

## 2016-06-09 DIAGNOSIS — K219 Gastro-esophageal reflux disease without esophagitis: Secondary | ICD-10-CM | POA: Diagnosis not present

## 2016-06-09 DIAGNOSIS — Z9181 History of falling: Secondary | ICD-10-CM | POA: Insufficient documentation

## 2016-06-09 DIAGNOSIS — F319 Bipolar disorder, unspecified: Secondary | ICD-10-CM | POA: Insufficient documentation

## 2016-06-09 LAB — BASIC METABOLIC PANEL
Anion gap: 12 (ref 5–15)
BUN: 36 mg/dL — AB (ref 6–20)
CALCIUM: 9.8 mg/dL (ref 8.9–10.3)
CO2: 25 mmol/L (ref 22–32)
CREATININE: 1.76 mg/dL — AB (ref 0.44–1.00)
Chloride: 107 mmol/L (ref 101–111)
GFR calc non Af Amer: 27 mL/min — ABNORMAL LOW (ref >60)
GFR, EST AFRICAN AMERICAN: 31 mL/min — AB (ref >60)
GLUCOSE: 128 mg/dL — AB (ref 65–99)
Potassium: 3.6 mmol/L (ref 3.5–5.1)
Sodium: 144 mmol/L (ref 135–145)

## 2016-06-09 LAB — URINALYSIS, ROUTINE W REFLEX MICROSCOPIC
BILIRUBIN URINE: NEGATIVE
GLUCOSE, UA: NEGATIVE mg/dL
KETONES UR: NEGATIVE mg/dL
Leukocytes, UA: NEGATIVE
Nitrite: NEGATIVE
PH: 7 (ref 5.0–8.0)
Protein, ur: >300 mg/dL — AB
SPECIFIC GRAVITY, URINE: 1.015 (ref 1.005–1.030)

## 2016-06-09 LAB — URINALYSIS, MICROSCOPIC (REFLEX)

## 2016-06-09 NOTE — Progress Notes (Signed)
Location:   [facility] Room Number: 110/W Place of Service:  SNF [phone]) Provider:  Clydene Fake, MD  Patient Care Team: [name], MD as PCP - General (Internal Medicine)  Extended Emergency Contact Information Primary Emergency Contact: [facility] Address: [address]          [place], [place] [address] [name] of [place] Phone: [phone] Work Phone: [phone] Mobile Phone: [phone] Relation: Daughter Secondary Emergency Contact: [name] Address: [address]          [place], [place] [address] [name] of [place] Phone: [phone] Mobile Phone: [phone] Relation: Relative  Code Status:  DNR Goals of care: Advanced Directive information Advanced Directives 06/09/2016  Does Patient Have a Medical Advance Directive? Yes  Type of Advance Directive Out of facility DNR (pink MOST or yellow form)  Does patient want to make changes to medical advance directive? No - Patient declined  Copy of [place] in Chart? -  Would patient like information on creating a medical advance directive? No - Patient declined  Pre-existing out of facility DNR order (yellow form or pink MOST form) -     Chief Complaint  Patient presents with  . Acute Visit    Nausea,Vomiting    HPI:  Pt is a 81 y.o. female seen today for an acute visit for Her nausea and vomiting  . Patient has a history of hypertension, chronic renal disease, breast cancer status post mastectomy ,dementia with behavior issues.  Patient is unable to give me any history but for past couple weeks patient has been having problems with vomiting especially in the morning. Patient has not been able to tolerate solid food for past 1 year she usually throwing anything with any texture out. only thing she does is drink  usually water and supplements  Initially it was  thought that patient has reflux and restarted her on her Prilosec patient  does have a history of gastroparesis is on chronic Zofran but her daughter says nothing has helped and she continues to have these vomiting episodes especially morning Today her daughter was very concerned. She wanted to know if her mother can be sent to the emergency room for further evaluation of her vomiting.  Patient was sitting in the wheelchair.  Initially when I saw her she did not look comfortable she has severe dementia and is unable to give me any history . Per her daughter patient has not complained of any abdominal pain. She has not had any diarrhea and besides her vomiting in the morning most of the time she is able to tolerate  Fluids. Patient has not had any fever or chills.or cough. Patient's weight has Fluctuated anywhere b/w 135 -138 lbs. Past Medical History:  Diagnosis Date  . Cancer ([place])   . Dementia   . Gallstones   . Gastroparalysis   . HTN (hypertension)   . Infiltrating ductal carcinoma of right female breast ([place]) 12/02/2009   Qualifier: History of  By: [name] CMA [name]), June    . Osteoporosis 11/18/2011  . Reflux    Past Surgical History:  Procedure Laterality Date  . BREAST BIOPSY    . BREAST LUMPECTOMY    . COLONOSCOPY  2009   Dr. Fuller Plan: normal  . ESOPHAGOGASTRODUODENOSCOPY  12/11/2009   Dr. Fuller Plan: hiatal hernia  . ESOPHAGOGASTRODUODENOSCOPY N/A 01/29/2014   Procedure: ESOPHAGOGASTRODUODENOSCOPY (EGD);  Surgeon: [name], MD;  [facility];  Service: Endoscopy;  Laterality: N/A;  245  . TONSILLECTOMY AND ADENOIDECTOMY      Allergies  Allergen Reactions  . Aricept [Donepezil Hydrochloride] Nausea And Vomiting  . Exelon [Rivastigmine Tartrate]   . Lithium Nausea And Vomiting    Current Outpatient Prescriptions on File Prior to Visit  Medication Sig Dispense Refill  . acetaminophen (TYLENOL) 325 MG tablet Take one tablet by mouth at bedtime    . docusate (COLACE) 50 MG/5ML liquid Take 100 mg by mouth daily.    [name] omeprazole  (PRILOSEC) 40 MG capsule Take 40 mg by mouth daily.    [name] omeprazole (PRILOSEC) 40 MG capsule Take 40 mg by mouth 2 (two) times daily. Until 06/17/16    . ondansetron (ZOFRAN-ODT) 4 MG disintegrating tablet Dissolve 1 tablet in mouth 3 times daily before meals and at bedtime for nausea    . polyethylene glycol (MIRALAX / GLYCOLAX) packet Take 17 g by mouth daily.      [name] ZINC OXIDE, TOPICAL, 10 % CREA Apply sparingly to buttocks d/t redness     No current facility-administered medications on file prior to visit.      Review of Systems  Unable to perform ROS: Dementia    Immunization History  Administered Date(s) Administered  . Influenza-Unspecified 02/13/2014, 02/09/2016   Pertinent  Health Maintenance Due  Topic Date Due  . PNA vac Low Risk Adult (1 of 2 - PCV13) 10/05/2016 (Originally 01/13/2003)  . INFLUENZA VACCINE  Completed  . DEXA SCAN  Completed   No flowsheet data found. Functional Status Survey:    Vitals:   06/09/16 1105  BP: 126/60  Pulse: 63  Resp: 18  Temp: 97.3 F (36.3 C)  TempSrc: Oral  SpO2: 100%   There is no height or weight on file to calculate BMI. Physical Exam  Constitutional: She appears well-developed and well-nourished.  HENT:  Head: Normocephalic.  Mouth/Throat: Oropharynx is clear and moist.  Eyes: Pupils are equal, round, and reactive to light.  Neck: Neck supple.  Cardiovascular: Normal rate, regular rhythm and normal heart sounds.   No murmur heard. Pulmonary/Chest: Effort normal and breath sounds normal. No respiratory distress. She has no wheezes. She has no rales.  Abdominal: Soft. Bowel sounds are normal. She exhibits no distension. There is no tenderness. There is no rebound.  Musculoskeletal: She exhibits no edema.  Neurological: She is alert.  Skin: Skin is warm and dry. No rash noted. No erythema. No pallor.  Psychiatric: She has a normal mood and affect. Her behavior is normal.    Labs reviewed:  Recent Labs   11/27/15 0725  04/11/16 0700  06/02/16 0700 06/07/16 0700 06/09/16 0700  NA 141  < > 139  < > 143 140 144  K 3.6  < > 3.9  < > 3.6 3.4* 3.6  CL 104  < > 103  < > 105 103 107  CO2 28  < > 27  < > 28 29 25   GLUCOSE 83  < > 89  < > 101* 87 128*  BUN 39*  < > 40*  < > 37* 40* 36*  CREATININE 1.86*  < > 1.50*  < > 1.53* 1.67* 1.76*  CALCIUM 9.4  < > 9.4  < > 9.6 9.4 9.8  MG 2.4  --  2.6*  --  2.6*  --   --   < > = values in this interval not displayed.  Recent Labs  04/11/16 0700 05/25/16 0500  AST 20 25  ALT  12* 19  ALKPHOS 65 70  BILITOT 0.3 0.7  PROT 6.7 7.3  ALBUMIN 3.5 3.9    Recent Labs  04/11/16 0700 05/23/16 1408 05/24/16 0500  WBC 6.3 10.1 7.5  NEUTROABS  --  8.1* 5.8  HGB 11.8* 15.2* 13.1  HCT 37.3 47.9* 41.6  MCV 93.5 93.9 93.1  PLT 212 243 223   Lab Results  Component Value Date   TSH 0.723 11/27/2015   Lab Results  Component Value Date   HGBA1C  05/17/2007    5.4 (NOTE)   The ADA recommends the following therapeutic goals for glycemic   control related to Hgb A1C measurement:   Goal of Therapy:   < 7.0% Hgb A1C   Action Suggested:  > 8.0% Hgb A1C   Ref:  Diabetes Care, 22, Suppl. 1, 1999   Lab Results  Component Value Date   CHOL  05/22/2007    175        ATP III CLASSIFICATION:  <200     mg/dL   Desirable  200-239  mg/dL   Borderline High  >=240    mg/dL   High   HDL 37 (L) 05/22/2007   LDLCALC  05/22/2007    96        Total Cholesterol/HDL:CHD Risk Coronary Heart Disease Risk Table                     Men   Women  1/2 Average Risk   3.4   3.3   TRIG 208 (H) 05/22/2007   CHOLHDL 4.7 05/22/2007    Significant Diagnostic Results in last 30 days:  No results found.  Assessment/Plan  Nausea And vomiting Patient has h/o Gastroparesis and is on Zofran.  I had Malachy Moan discussion with the daughter and Speech therapist. At this time I Do not think patient has anything acute going on. Her BUN and Creat is stable. And os is her weight. I have  discussed the plan with her daughter. We will continue to watch Mrs Nieland in facility Will get Barium Swallow to evaluate for dysphagia. If patient tolerates and cooperate. Also Get GI consult to evaluate patient for Dysphagia. Also Check UA. Speech therapist also think her symptoms are more due to Gastroparesis And will continue to evaluate her as needed .  Hypertension Her BP was running high and her Norvasc was increased to 5mg . Her BP today is Normal.   Total time spent in this patient care encounter was 45_ minutes; greater than 50% of the visit spent counseling patient and coordinating care for problems addressed at this encounter.   Family/ staff Communication:   Labs/tests ordered:

## 2016-06-10 ENCOUNTER — Encounter (HOSPITAL_COMMUNITY): Payer: Self-pay | Admitting: Emergency Medicine

## 2016-06-10 ENCOUNTER — Encounter: Payer: Self-pay | Admitting: Internal Medicine

## 2016-06-10 ENCOUNTER — Other Ambulatory Visit: Payer: Self-pay

## 2016-06-10 ENCOUNTER — Inpatient Hospital Stay (HOSPITAL_COMMUNITY)
Admission: EM | Admit: 2016-06-10 | Discharge: 2016-06-12 | DRG: 193 | Disposition: A | Discharge status: Skilled Nursing Facility | Payer: Medicare Other | Attending: Family Medicine | Admitting: Family Medicine

## 2016-06-10 ENCOUNTER — Non-Acute Institutional Stay (SKILLED_NURSING_FACILITY): Payer: Medicare Other | Admitting: Internal Medicine

## 2016-06-10 ENCOUNTER — Emergency Department (HOSPITAL_COMMUNITY): Payer: Medicare Other

## 2016-06-10 ENCOUNTER — Encounter (HOSPITAL_COMMUNITY)
Admission: AD | Admit: 2016-06-10 | Discharge: 2016-06-10 | Disposition: A | Discharge status: Home / Self Care | Payer: Medicare Other | Source: Skilled Nursing Facility | Attending: *Deleted | Admitting: *Deleted

## 2016-06-10 DIAGNOSIS — I129 Hypertensive chronic kidney disease with stage 1 through stage 4 chronic kidney disease, or unspecified chronic kidney disease: Secondary | ICD-10-CM | POA: Diagnosis present

## 2016-06-10 DIAGNOSIS — F03918 Unspecified dementia, unspecified severity, with other behavioral disturbance: Secondary | ICD-10-CM | POA: Diagnosis present

## 2016-06-10 DIAGNOSIS — Z853 Personal history of malignant neoplasm of breast: Secondary | ICD-10-CM | POA: Diagnosis not present

## 2016-06-10 DIAGNOSIS — R269 Unspecified abnormalities of gait and mobility: Secondary | ICD-10-CM

## 2016-06-10 DIAGNOSIS — R531 Weakness: Secondary | ICD-10-CM | POA: Diagnosis not present

## 2016-06-10 DIAGNOSIS — E870 Hyperosmolality and hypernatremia: Secondary | ICD-10-CM | POA: Diagnosis not present

## 2016-06-10 DIAGNOSIS — F319 Bipolar disorder, unspecified: Secondary | ICD-10-CM | POA: Diagnosis present

## 2016-06-10 DIAGNOSIS — I10 Essential (primary) hypertension: Secondary | ICD-10-CM | POA: Diagnosis not present

## 2016-06-10 DIAGNOSIS — N289 Disorder of kidney and ureter, unspecified: Secondary | ICD-10-CM | POA: Diagnosis not present

## 2016-06-10 DIAGNOSIS — F0281 Dementia in other diseases classified elsewhere with behavioral disturbance: Secondary | ICD-10-CM | POA: Diagnosis not present

## 2016-06-10 DIAGNOSIS — N179 Acute kidney failure, unspecified: Secondary | ICD-10-CM | POA: Diagnosis not present

## 2016-06-10 DIAGNOSIS — N189 Chronic kidney disease, unspecified: Secondary | ICD-10-CM | POA: Diagnosis not present

## 2016-06-10 DIAGNOSIS — E860 Dehydration: Secondary | ICD-10-CM | POA: Diagnosis present

## 2016-06-10 DIAGNOSIS — R410 Disorientation, unspecified: Secondary | ICD-10-CM | POA: Diagnosis not present

## 2016-06-10 DIAGNOSIS — G308 Other Alzheimer's disease: Secondary | ICD-10-CM | POA: Diagnosis not present

## 2016-06-10 DIAGNOSIS — Z66 Do not resuscitate: Secondary | ICD-10-CM | POA: Diagnosis present

## 2016-06-10 DIAGNOSIS — R112 Nausea with vomiting, unspecified: Secondary | ICD-10-CM

## 2016-06-10 DIAGNOSIS — E43 Unspecified severe protein-calorie malnutrition: Secondary | ICD-10-CM | POA: Diagnosis not present

## 2016-06-10 DIAGNOSIS — M810 Age-related osteoporosis without current pathological fracture: Secondary | ICD-10-CM | POA: Diagnosis present

## 2016-06-10 DIAGNOSIS — R1314 Dysphagia, pharyngoesophageal phase: Secondary | ICD-10-CM | POA: Diagnosis present

## 2016-06-10 DIAGNOSIS — R509 Fever, unspecified: Secondary | ICD-10-CM | POA: Diagnosis not present

## 2016-06-10 DIAGNOSIS — J101 Influenza due to other identified influenza virus with other respiratory manifestations: Principal | ICD-10-CM | POA: Diagnosis present

## 2016-06-10 DIAGNOSIS — N390 Urinary tract infection, site not specified: Secondary | ICD-10-CM | POA: Diagnosis present

## 2016-06-10 DIAGNOSIS — Z79899 Other long term (current) drug therapy: Secondary | ICD-10-CM | POA: Diagnosis not present

## 2016-06-10 DIAGNOSIS — N184 Chronic kidney disease, stage 4 (severe): Secondary | ICD-10-CM

## 2016-06-10 DIAGNOSIS — F3130 Bipolar disorder, current episode depressed, mild or moderate severity, unspecified: Secondary | ICD-10-CM | POA: Diagnosis not present

## 2016-06-10 DIAGNOSIS — F0391 Unspecified dementia with behavioral disturbance: Secondary | ICD-10-CM | POA: Diagnosis present

## 2016-06-10 DIAGNOSIS — K3184 Gastroparesis: Secondary | ICD-10-CM | POA: Diagnosis not present

## 2016-06-10 DIAGNOSIS — R1311 Dysphagia, oral phase: Secondary | ICD-10-CM | POA: Diagnosis not present

## 2016-06-10 HISTORY — DX: Disorder of kidney and ureter, unspecified: N28.9

## 2016-06-10 LAB — COMPREHENSIVE METABOLIC PANEL
ALBUMIN: 4.4 g/dL (ref 3.5–5.0)
ALBUMIN: 4 g/dL (ref 3.5–5.0)
ALT: 41 U/L (ref 14–54)
ALT: 51 U/L (ref 14–54)
ANION GAP: 14 (ref 5–15)
AST: 41 U/L (ref 15–41)
AST: 46 U/L — AB (ref 15–41)
Alkaline Phosphatase: 77 U/L (ref 38–126)
Alkaline Phosphatase: 75 U/L (ref 38–126)
Anion gap: 13 (ref 5–15)
BILIRUBIN TOTAL: 0.5 mg/dL (ref 0.3–1.2)
BILIRUBIN TOTAL: 0.4 mg/dL (ref 0.3–1.2)
BUN: 32 mg/dL — ABNORMAL HIGH (ref 6–20)
BUN: 32 mg/dL — AB (ref 6–20)
CHLORIDE: 107 mmol/L (ref 101–111)
CO2: 23 mmol/L (ref 22–32)
CO2: 24 mmol/L (ref 22–32)
Calcium: 10.2 mg/dL (ref 8.9–10.3)
Calcium: 9.9 mg/dL (ref 8.9–10.3)
Chloride: 108 mmol/L (ref 101–111)
Creatinine, Ser: 2.03 mg/dL — ABNORMAL HIGH (ref 0.44–1.00)
Creatinine, Ser: 1.96 mg/dL — ABNORMAL HIGH (ref 0.44–1.00)
GFR calc Af Amer: 26 mL/min — ABNORMAL LOW (ref >60)
GFR calc Af Amer: 27 mL/min — ABNORMAL LOW (ref >60)
GFR calc non Af Amer: 22 mL/min — ABNORMAL LOW (ref >60)
GFR calc non Af Amer: 23 mL/min — ABNORMAL LOW (ref >60)
GLUCOSE: 165 mg/dL — AB (ref 65–99)
GLUCOSE: 135 mg/dL — AB (ref 65–99)
POTASSIUM: 5 mmol/L (ref 3.5–5.1)
POTASSIUM: 4.1 mmol/L (ref 3.5–5.1)
SODIUM: 145 mmol/L (ref 135–145)
Sodium: 144 mmol/L (ref 135–145)
TOTAL PROTEIN: 8.4 g/dL — AB (ref 6.5–8.1)
Total Protein: 8.1 g/dL (ref 6.5–8.1)

## 2016-06-10 LAB — CBC WITH DIFFERENTIAL/PLATELET
BASOS PCT: 0 %
Basophils Absolute: 0 10*3/uL (ref 0.0–0.1)
Basophils Absolute: 0 10*3/uL (ref 0.0–0.1)
Basophils Relative: 0 %
EOS ABS: 0 10*3/uL (ref 0.0–0.7)
EOS PCT: 0 %
Eosinophils Absolute: 0 10*3/uL (ref 0.0–0.7)
Eosinophils Relative: 0 %
HCT: 44.1 % (ref 36.0–46.0)
HEMATOCRIT: 49.8 % — AB (ref 36.0–46.0)
Hemoglobin: 15.6 g/dL — ABNORMAL HIGH (ref 12.0–15.0)
Hemoglobin: 14.2 g/dL (ref 12.0–15.0)
LYMPHS ABS: 1 10*3/uL (ref 0.7–4.0)
LYMPHS PCT: 11 %
Lymphocytes Relative: 5 %
Lymphs Abs: 0.5 10*3/uL — ABNORMAL LOW (ref 0.7–4.0)
MCH: 29.5 pg (ref 26.0–34.0)
MCH: 30 pg (ref 26.0–34.0)
MCHC: 31.3 g/dL (ref 30.0–36.0)
MCHC: 32.2 g/dL (ref 30.0–36.0)
MCV: 94.3 fL (ref 78.0–100.0)
MCV: 93 fL (ref 78.0–100.0)
MONO ABS: 0.5 10*3/uL (ref 0.1–1.0)
MONO ABS: 1 10*3/uL (ref 0.1–1.0)
MONOS PCT: 5 %
MONOS PCT: 11 %
Neutro Abs: 8.3 10*3/uL — ABNORMAL HIGH (ref 1.7–7.7)
Neutro Abs: 7.3 10*3/uL (ref 1.7–7.7)
Neutrophils Relative %: 90 %
Neutrophils Relative %: 78 %
PLATELETS: 256 10*3/uL (ref 150–400)
Platelets: 262 10*3/uL (ref 150–400)
RBC: 5.28 MIL/uL — ABNORMAL HIGH (ref 3.87–5.11)
RBC: 4.74 MIL/uL (ref 3.87–5.11)
RDW: 14.4 % (ref 11.5–15.5)
RDW: 14.2 % (ref 11.5–15.5)
WBC: 9.3 10*3/uL (ref 4.0–10.5)
WBC: 9.3 10*3/uL (ref 4.0–10.5)

## 2016-06-10 LAB — TSH: TSH: 0.749 u[IU]/mL (ref 0.350–4.500)

## 2016-06-10 LAB — INFLUENZA PANEL BY PCR (TYPE A & B)
INFLBPCR: NEGATIVE
Influenza A By PCR: POSITIVE — AB

## 2016-06-10 LAB — I-STAT CG4 LACTIC ACID, ED: LACTIC ACID, VENOUS: 1.74 mmol/L (ref 0.5–1.9)

## 2016-06-10 MED ORDER — OSELTAMIVIR PHOSPHATE 30 MG PO CAPS: 30.0000 mg | ORAL_CAPSULE | Freq: Once | ORAL | Status: DC

## 2016-06-10 MED ORDER — SODIUM CHLORIDE 0.9 % IV BOLUS (SEPSIS)
1000.0000 mL | Freq: Once | INTRAVENOUS | Status: AC
  Administered 2016-06-10: 1000 mL via INTRAVENOUS

## 2016-06-10 MED ORDER — ZINC OXIDE 10 % EX CREA
1.0000 "application " | TOPICAL_CREAM | Freq: Three times a day (TID) | CUTANEOUS | Status: DC
  Administered 2016-06-10 – 2016-06-11 (×4): 1 via TOPICAL
  Filled 2016-06-10 (×7): qty 50

## 2016-06-10 MED ORDER — OSELTAMIVIR PHOSPHATE 6 MG/ML PO SUSR
30.0000 mg | Freq: Two times a day (BID) | ORAL | Status: DC
  Administered 2016-06-10 – 2016-06-12 (×4): 30 mg via ORAL
  Filled 2016-06-10 (×6): qty 5

## 2016-06-10 MED ORDER — ONDANSETRON HCL 4 MG/2ML IJ SOLN
4.0000 mg | Freq: Four times a day (QID) | INTRAMUSCULAR | Status: DC | PRN
  Administered 2016-06-11: 4 mg via INTRAVENOUS
  Filled 2016-06-10: qty 2

## 2016-06-10 MED ORDER — HEPARIN SODIUM (PORCINE) 5000 UNIT/ML IJ SOLN
5000.0000 [IU] | Freq: Three times a day (TID) | INTRAMUSCULAR | Status: DC
  Administered 2016-06-10 – 2016-06-12 (×5): 5000 [IU] via SUBCUTANEOUS
  Filled 2016-06-10 (×5): qty 1

## 2016-06-10 MED ORDER — PANTOPRAZOLE SODIUM 40 MG PO TBEC
40.0000 mg | DELAYED_RELEASE_TABLET | Freq: Every day | ORAL | Status: DC
  Administered 2016-06-11: 40 mg via ORAL
  Filled 2016-06-10 (×2): qty 1

## 2016-06-10 MED ORDER — SODIUM CHLORIDE 0.9 % IV BOLUS (SEPSIS)
1000.0000 mL | Freq: Once | INTRAVENOUS | Status: AC
Start: 2016-06-10 — End: 2016-06-10
  Administered 2016-06-10: 1000 mL via INTRAVENOUS

## 2016-06-10 MED ORDER — ONDANSETRON HCL 4 MG PO TABS: 4.0000 mg | ORAL_TABLET | Freq: Four times a day (QID) | ORAL | Status: DC | PRN

## 2016-06-10 MED ORDER — ACETAMINOPHEN 325 MG PO TABS: 325.0000 mg | ORAL_TABLET | ORAL | Status: DC | PRN

## 2016-06-10 MED ORDER — ACETAMINOPHEN 650 MG RE SUPP
650.0000 mg | Freq: Four times a day (QID) | RECTAL | Status: DC | PRN
  Administered 2016-06-11: 650 mg via RECTAL
  Filled 2016-06-10: qty 1

## 2016-06-10 MED ORDER — ACETAMINOPHEN 325 MG PO TABS
650.0000 mg | ORAL_TABLET | Freq: Four times a day (QID) | ORAL | Status: DC | PRN
  Administered 2016-06-11: 650 mg via ORAL
  Filled 2016-06-10: qty 2

## 2016-06-10 MED ORDER — ENSURE ENLIVE PO LIQD: 237.0000 mL | Freq: Two times a day (BID) | ORAL | Status: DC

## 2016-06-10 NOTE — H&P (Signed)
History and Physical    [name] ASN:[id number] DOB: [birth year] DOA: 06/10/2016  PCP: [name], MD  Patient coming from: SNF    Chief Complaint:   Fever, nausea, and vomiting.  HPI: [name] is an 81 y.o. female with history of dementia, CKD, HTN, breast CA, SNF resident with DNR code status, presented to the ER for fever 101, nausea, vomiting, and decreased oral intake.   She has advanced dementia and was not able to give any complaints. She was given an IM shot with Rocephin at the SNF yesterday.  Work up in the ER showed Influenza A positive, with normal WBC, Hb, and Cr 1.9. Hospitalist was asked to admit her for dehydration, nausea, and the Flu.    ED Course:  See above.  Rewiew of Systems:    Past Medical History:  Diagnosis Date  . Cancer ([place])   . Dementia   . Gallstones   . Gastroparalysis   . HTN (hypertension)   . Infiltrating ductal carcinoma of right female breast ([place]) 12/02/2009   Qualifier: History of  By: [name] CMA [name]), June    . Osteoporosis 11/18/2011  . Reflux   . Renal disorder    renal insufficiency.     Past Surgical History:  Procedure Laterality Date  . BREAST BIOPSY    . BREAST LUMPECTOMY    . COLONOSCOPY  2009   Dr. Fuller Plan: normal  . ESOPHAGOGASTRODUODENOSCOPY  12/11/2009   Dr. Fuller Plan: hiatal hernia  . ESOPHAGOGASTRODUODENOSCOPY N/A 01/29/2014   Procedure: ESOPHAGOGASTRODUODENOSCOPY (EGD);  Surgeon: [name], MD;  [facility];  Service: Endoscopy;  Laterality: N/A;  245  . TONSILLECTOMY AND ADENOIDECTOMY       reports that she has never smoked. She has never used smokeless tobacco. She reports that she does not drink alcohol or use drugs.  Allergies  Allergen Reactions  . Aricept [Donepezil Hydrochloride] Nausea And Vomiting  . Exelon [Rivastigmine Tartrate]   . Lithium Nausea And Vomiting    Family History  Problem Relation Age of Onset  . Cancer Sister     breast  . Cancer    . Colon cancer Neg Hx       Prior to Admission medications   Medication Sig Start Date End Date Taking? Authorizing Provider  acetaminophen (TYLENOL) 325 MG tablet Take one tablet by mouth at bedtime   Yes Historical Provider, MD  amLODipine (NORVASC) 5 MG tablet Take 5 mg by mouth daily.   Yes Historical Provider, MD  cefTRIAXone (ROCEPHIN) IVPB Inject 1 g into the vein daily. Course starting on 06/10/2016 and ending on 06/14/2016   Yes Historical Provider, MD  cloNIDine (CATAPRES) 0.1 MG tablet Take 0.1 mg by mouth daily.   Yes Historical Provider, MD  docusate (COLACE) 50 MG/5ML liquid Take 100 mg by mouth daily.   Yes Historical Provider, MD  magnesium hydroxide (MILK OF MAGNESIA) 400 MG/5ML suspension Take 30 mLs by mouth daily as needed for mild constipation.   Yes Historical Provider, MD  omeprazole (PRILOSEC) 40 MG capsule Take 40 mg by mouth 2 (two) times daily. Until 06/17/16   Yes Historical Provider, MD  ondansetron (ZOFRAN-ODT) 4 MG disintegrating tablet Dissolve 1 tablet in mouth 3 times daily before meals and at bedtime for nausea   Yes Historical Provider, MD  polyethylene glycol (MIRALAX / GLYCOLAX) packet Take 17 g by mouth daily.     Yes Historical Provider, MD  potassium chloride 20 MEQ/15ML (10%) SOLN Take  40 mEq by mouth daily.   Yes Historical Provider, MD  ZINC OXIDE, TOPICAL, 10 % CREA Apply 1 application topically 3 (three) times daily. Apply sparingly to buttocks d/t redness    Yes Historical Provider, MD    Physical Exam: Vitals:   06/10/16 2000 06/10/16 2030 06/10/16 2130 06/10/16 2200  BP: 148/90 157/89 163/89 168/94  Pulse: 89   94  Resp: 13 10 23 16   Temp:      TempSrc:      SpO2: 96%   96%  Weight:      Height:          Constitutional: NAD, calm, comfortable Vitals:   06/10/16 2000 06/10/16 2030 06/10/16 2130 06/10/16 2200  BP: 148/90 157/89 163/89 168/94  Pulse: 89   94  Resp: 13 10 23 16   Temp:      TempSrc:      SpO2: 96%   96%  Weight:      Height:        Eyes: PERRL, lids and conjunctivae normal ENMT: Mucous membranes are moist. Posterior pharynx clear of any exudate or lesions.Normal dentition.  Neck: normal, supple, no masses, no thyromegaly Respiratory: clear to auscultation bilaterally, no wheezing, no crackles. Normal respiratory effort. No accessory muscle use.  Cardiovascular: Regular rate and rhythm, no murmurs / rubs / gallops. No extremity edema. 2+ pedal pulses. No carotid bruits.  Abdomen: no tenderness, no masses palpated. No hepatosplenomegaly. Bowel sounds positive.  Musculoskeletal: no clubbing / cyanosis. No joint deformity upper and lower extremities. Good ROM, no contractures. Normal muscle tone.  Skin: no rashes, lesions, ulcers. No induration Neurologic: CN 2-12 grossly intact. Sensation intact, DTR normal. Strength 5/5 in all 4.  Psychiatric: Unable.    Labs on Admission: I have personally reviewed following labs and imaging studies  CBC:  Recent Labs Lab 06/10/16 1300 06/10/16 1838  WBC 9.3 9.3  NEUTROABS 8.3* 7.3  HGB 15.6* 14.2  HCT 49.8* 44.1  MCV 94.3 93.0  PLT 262 599   Basic Metabolic Panel:  Recent Labs Lab 06/07/16 0700 06/09/16 0700 06/10/16 1300 06/10/16 1838  NA 140 144 145 144  K 3.4* 3.6 5.0 4.1  CL 103 107 108 107  CO2 29 25 23 24   GLUCOSE 87 128* 165* 135*  BUN 40* 36* 32* 32*  CREATININE 1.67* 1.76* 2.03* 1.96*  CALCIUM 9.4 9.8 10.2 9.9   GFR: Estimated Creatinine Clearance: 22.1 mL/min (by C-G formula based on SCr of 1.96 mg/dL (H)). Liver Function Tests:  Recent Labs Lab 06/10/16 1300 06/10/16 1838  AST 41 46*  ALT 41 51  ALKPHOS 77 75  BILITOT 0.5 0.4  PROT 8.4* 8.1  ALBUMIN 4.4 4.0    Urine analysis:    Component Value Date/Time   COLORURINE YELLOW 06/09/2016 1300   APPEARANCEUR HAZY (A) 06/09/2016 1300   LABSPEC 1.015 06/09/2016 1300   PHURINE 7.0 06/09/2016 1300   GLUCOSEU NEGATIVE 06/09/2016 1300   HGBUR SMALL (A) 06/09/2016 1300   BILIRUBINUR  NEGATIVE 06/09/2016 1300   KETONESUR NEGATIVE 06/09/2016 1300   PROTEINUR >300 (A) 06/09/2016 1300   UROBILINOGEN 0.2 01/21/2015 1245   NITRITE NEGATIVE 06/09/2016 1300   LEUKOCYTESUR NEGATIVE 06/09/2016 1300    Recent Results (from the past 240 hour(s))  Blood Culture (routine x 2)     Status: None (Preliminary result)   Collection Time: 06/10/16  6:38 PM  Result Value Ref Range Status   Specimen Description BLOOD RIGHT FOREARM  Final   Special  Requests BOTTLES DRAWN AEROBIC AND ANAEROBIC 8CC EACH  Final   Culture PENDING  Incomplete   Report Status PENDING  Incomplete  Blood Culture (routine x 2)     Status: None (Preliminary result)   Collection Time: 06/10/16  7:41 PM  Result Value Ref Range Status   Specimen Description BLOOD LEFT FOREARM  Final   Special Requests BOTTLES DRAWN AEROBIC AND ANAEROBIC [facility] EACH  Final   Culture PENDING  Incomplete   Report Status PENDING  Incomplete     Radiological Exams on Admission: Dg Chest Port 1 View  Result Date: 06/10/2016 CLINICAL DATA:  Fever.  Decreased oral intake. EXAM: PORTABLE CHEST 1 VIEW COMPARISON:  01/09/2014 FINDINGS: Normal heart size for technique. Aortic tortuosity accentuated by rightward rotation. There is no edema, consolidation, effusion, or pneumothorax. IMPRESSION: No acute finding. Electronically Signed   By: Monte Fantasia M.D.   On: 06/10/2016 19:23    EKG: Independently reviewed.   Assessment/Plan Principal Problem:   Influenza A Active Problems:   Dementia with behavioral disturbance   Dysphagia, pharyngoesophageal phase   HTN (hypertension)   Abnormality of gait   Renal insufficiency   Bipolar disorder (HCC)    PLAN:   Influenza A:  Though a little late, will initiate Tamiflu.  Give reduced dose given low GFR.  Supportive care.  Volume depletion: Will give gentle IVF.  This is also due to her nausea and vomiting at this time.  She does have CKD, but her Cr is a little above baseline.     Dysphagia:  She is on Full liquid only. Do NOT advance her diet.   HTN:  Hold some of her anti HTN meds.  Resume when appropriate.    DVT prophylaxis: sub Q hepain.  Code Status: DNR.  Family Communication: daughter and son in laws at bedside.  Disposition Plan: to SNF when better.  Consults called: None.  Admission status: OBS.   Ladislaus Repsher MD FACP. [facility]  If 7PM-7AM, please contact night-coverage [email or web address] Password [facility]  06/10/2016, 10:35 PM

## 2016-06-10 NOTE — Progress Notes (Signed)
Location:   [facility] Room Number: 110/W Place of Service:  SNF [id number]) Provider:  [place] Lewis  [name], MD  Patient Care Team: [name], MD as PCP - General (Internal Medicine) [name], MD as Consulting Physician (Gastroenterology)  Extended Emergency Contact Information Primary Emergency Contact: [name] Address: [address]          [place], [place] [address] [name] of [place] Phone: [phone] Work Phone: [phone] Mobile Phone: [phone] Relation: Daughter Secondary Emergency Contact: [name] Address: [address]          [place], [place] [address] [name] of [place] Phone: [phone] Mobile Phone: [phone] Relation: Relative  Code Status:  DNR Goals of care: Advanced Directive information Advanced Directives 06/10/2016  Does Patient Have a Medical Advance Directive? Yes  Type of Advance Directive Out of facility DNR (pink MOST or yellow form)  Does patient want to make changes to medical advance directive? No - Patient declined  Copy of [place] in Chart? -  Would patient like information on creating a medical advance directive? No - Patient declined  Pre-existing out of facility DNR order (yellow form or pink MOST form) -     Chief Complaint  Patient presents with  . Acute Visit    Nausea,Vomiting    HPI:  Pt is a 81 y.o. female seen today for an acute visit forFollow-up of nausea and vomiting-she also had increased axillary temperature of 100.0-  She does have recent history of increased nausea and vomiting and actually was seen by Dr. Lyndel Safe yesterday.  She has not been able to really tolerate solid food For an extended period of time  She does have a history of gastroparesis at one point had been on Reglan she continues on chronic Zofran.  This vomiting appears to be more so in the morning and this was the case again this morning however this morning she  actually had a fever as well with an axillary temperature of 100.0--  Lab work was done yesterday which showed her renal function relatively baseline with creatinine 1.76 BUN of 36 potassium was 3.6 sodium was 144.  Also a barium swallow is pending to evaluate for dysphagia-also a GI consult pending.  A urinalysis was also drawn this appears fairly benign at this point but awaiting final culture results.    Past Medical History:  Diagnosis Date  . Cancer ([place])   . Dementia   . Gallstones   . Gastroparalysis   . HTN (hypertension)   . Infiltrating ductal carcinoma of right female breast ([place]) 12/02/2009   Qualifier: History of  By: [name] CMA [name]), June    . Osteoporosis 11/18/2011  . Reflux    Past Surgical History:  Procedure Laterality Date  . BREAST BIOPSY    . BREAST LUMPECTOMY    . COLONOSCOPY  2009   Dr. Fuller Plan: normal  . ESOPHAGOGASTRODUODENOSCOPY  12/11/2009   Dr. Fuller Plan: hiatal hernia  . ESOPHAGOGASTRODUODENOSCOPY N/A 01/29/2014   Procedure: ESOPHAGOGASTRODUODENOSCOPY (EGD);  Surgeon: [name], MD;  Location: [facility];  Service: Endoscopy;  Laterality: N/A;  245  . TONSILLECTOMY AND ADENOIDECTOMY      Allergies  Allergen Reactions  . Aricept [Donepezil Hydrochloride] Nausea And Vomiting  . Exelon [Rivastigmine Tartrate]   . Lithium Nausea And Vomiting    Current Outpatient Prescriptions on File Prior to Visit  Medication Sig Dispense Refill  . acetaminophen (TYLENOL) 325 MG tablet Take  one tablet by mouth at bedtime    . amLODipine (NORVASC) 5 MG tablet Take 5 mg by mouth daily.    [name] docusate (COLACE) 50 MG/5ML liquid Take 100 mg by mouth daily.    [name] omeprazole (PRILOSEC) 40 MG capsule Take 40 mg by mouth daily.    [name] omeprazole (PRILOSEC) 40 MG capsule Take 40 mg by mouth 2 (two) times daily. Until 06/17/16    . ondansetron (ZOFRAN-ODT) 4 MG disintegrating tablet Dissolve 1 tablet in mouth 3 times daily before meals and at bedtime for nausea    .  polyethylene glycol (MIRALAX / GLYCOLAX) packet Take 17 g by mouth daily.      . potassium chloride 20 MEQ/15ML (10%) SOLN Take 40 mEq by mouth daily.    [name] ZINC OXIDE, TOPICAL, 10 % CREA Apply sparingly to buttocks d/t redness     No current facility-administered medications on file prior to visit.      Review of Systems   Unattainable secondary to dementia please see history of present illness-he does not really appear to be complaining of abdominal pain or generalized pain does appear quite weak  Immunization History  Administered Date(s) Administered  . Influenza-Unspecified 02/13/2014, 02/09/2016   Pertinent  Health Maintenance Due  Topic Date Due  . PNA vac Low Risk Adult (1 of 2 - PCV13) 10/05/2016 (Originally 01/13/2003)  . INFLUENZA VACCINE  Completed  . DEXA SCAN  Completed   No flowsheet data found. Functional Status Survey:    Vitals:   06/10/16 1309  BP: (!) 144/103  Pulse: (!) 103  Resp: 20  Temp: 99.7 F (37.6 C)  TempSrc: Oral    Physical Exam   In general this is a somewhat frail elderly female in no distress but she appears quite frail and weak-appears not to be feeling well she is sitting in her wheelchair with her head slumped over--- she is certainly responsive but does not get agitated with exam today--which is somewhat unlike her  Her skin is warm and dry she does have some chronic bruising which appears relatively unchanged. There's possibly some mild increased warmth to touch of her skin  Eyes appear clear visual acuity appears grossly intact.  Oropharynx was difficult to assess since patient held her mouth tightly shut.  Chest poor respiratory effort but there is no labored breathing I could not appreciate any overt congestion. She did not really follow verbal commands to take a deep breath  Heart is regular rate and rhythm -- she does not really have significant lower extremity edema.  Her abdomen is somewhat protuberant at baseline it  is soft it does not appear to be acutely tender--sometimes she will grimace as a result of any invasive maneuver but is not really doing that today   Neurologic is grossly intact she is alert moves all extremities --  Psych she does have findings consistent with significant dementia--she is alert but does not really follow any verbal commands-again she appears to be weak and does not feel like talking  Labs reviewed:  Recent Labs  11/27/15 0725  04/11/16 0700  06/02/16 0700 06/07/16 0700 06/09/16 0700  NA 141  < > 139  < > 143 140 144  K 3.6  < > 3.9  < > 3.6 3.4* 3.6  CL 104  < > 103  < > 105 103 107  CO2 28  < > 27  < > 28 29 25   GLUCOSE 83  < > 89  < >  101* 87 128*  BUN 39*  < > 40*  < > 37* 40* 36*  CREATININE 1.86*  < > 1.50*  < > 1.53* 1.67* 1.76*  CALCIUM 9.4  < > 9.4  < > 9.6 9.4 9.8  MG 2.4  --  2.6*  --  2.6*  --   --   < > = values in this interval not displayed.  Recent Labs  04/11/16 0700 05/25/16 0500  AST 20 25  ALT 12* 19  ALKPHOS 65 70  BILITOT 0.3 0.7  PROT 6.7 7.3  ALBUMIN 3.5 3.9    Recent Labs  04/11/16 0700 05/23/16 1408 05/24/16 0500  WBC 6.3 10.1 7.5  NEUTROABS  --  8.1* 5.8  HGB 11.8* 15.2* 13.1  HCT 37.3 47.9* 41.6  MCV 93.5 93.9 93.1  PLT 212 243 223   Lab Results  Component Value Date   TSH 0.723 11/27/2015   Lab Results  Component Value Date   HGBA1C  05/17/2007    5.4 (NOTE)   The ADA recommends the following therapeutic goals for glycemic   control related to Hgb A1C measurement:   Goal of Therapy:   < 7.0% Hgb A1C   Action Suggested:  > 8.0% Hgb A1C   Ref:  Diabetes Care, 22, Suppl. 1, 1999   Lab Results  Component Value Date   CHOL  05/22/2007    175        ATP III CLASSIFICATION:  <200     mg/dL   Desirable  200-239  mg/dL   Borderline High  >=240    mg/dL   High   HDL 37 (L) 05/22/2007   LDLCALC  05/22/2007    96        Total Cholesterol/HDL:CHD Risk Coronary Heart Disease Risk Table                     Men    Women  1/2 Average Risk   3.4   3.3   TRIG 208 (H) 05/22/2007   CHOLHDL 4.7 05/22/2007    Significant Diagnostic Results in last 30 days:  No results found.  Assessment/Plan  #1 fever of unknown origin-has noted a urine culture is pending-also will check a chest x-ray to rule out any aspiration-also obtain an abdominal x-ray secondary to continued vomiting and again the fever.  I did discuss this with her daughter in the room as well as with the speech therapist-again there is thought to be an element of gastroparesis possibly contributing to this.  Will check an abdominal x-ray as well secondary to vomiting.  Will update a CBC with differential and CMP as well stat...  A barium swallow as well as a GI consult is pending  Since she is running a fever and she does appear to be febrile increased warmth accompanied with increased weakness frail appearance I did discuss possibly empiric. Antibiotic with her daughter and she is in agreement after labs are drawn will start Rocephin. C 1 g IM daily for 5 days and again monitor.  At this point she would prefer patient stay in  facility and not go to the ER--- pending results of the labs and investigational studies   #2 hypertension diastolic blood pressure is elevated 103-she is on Norvasc 5 mg a day and this appears to have helped I suspect possibly her feeling ill right be contributing to the higher reading-I did recheck this and got a comparable reading manually will give her clonidine  0.1 mg and have this rechecked in an hour  If blood pressure remains persistently elevated consider increasing her Norvasc   Of note on recheck after clonidine blood pressure did come down with a diastolic of 91-[id number] still somewhat elevated in the 150s at this point will monitor   r again consider increasing Norvasc if this is persistent    Addendum.   We have a obtained  updated lab work is concerning for a rising creatinine of 2.03 BUN is  32 sodium is 145 potassium is 5-I suspect patient is on the patht to significant dehydration since she is apparently not eating and drinking well and vomiting-  I did discuss her status with her daughter this evening-I also discussed this with Dr. Lyndel Safe via phone-per discussion with Dr. Lyndel Safe as well as with her daughter will send her to the ER for evaluation and workup-   Her vital signs are stable temperature is 97.6 pulse 89 respirations 18 O2 saturation 99% on room air-- her blood pressure now is 132/79       [id number]-of note greater than 35 minutes spent assessing patient-reviewing her chart review her labs discussing her status with her daughter in the room as well as with nursing staff-and coordinating and formulating a plan of care-of note greater than 50% of time spent coordinating plan of care       Oralia Manis, [place]

## 2016-06-10 NOTE — ED Provider Notes (Signed)
[facility] DEPT Provider Note   CSN: [id number] Arrival date & time: 06/10/16  1801     History   Chief Complaint Chief Complaint  Patient presents with  . Fever  . Emesis    HPI [name] is a 81 y.o. female.  HPI Patient with history of advanced dementia. Daughter is at bedside. Level V caveat applies. Patient comes from nursing home with decreased oral intake and vomiting for the past 3-4 days. Daughter states she's been unable to take liquids to solids. Has had increasing lethargy and stating that for the last few days. We diagnosed with UTI but does not believe the patient has received any antibiotics for this. No diarrhea. Past Medical History:  Diagnosis Date  . Cancer ([place])   . Dementia   . Gallstones   . Gastroparalysis   . HTN (hypertension)   . Infiltrating ductal carcinoma of right female breast ([place]) 12/02/2009   Qualifier: History of  By: [name] CMA [name]), June    . Osteoporosis 11/18/2011  . Reflux   . Renal disorder    renal insufficiency.     Patient Active Problem List   Diagnosis Date Noted  . Influenza A 06/10/2016  . Bipolar disorder ([place]) 06/25/2014  . Gastroparesis 01/20/2014  . GERD (gastroesophageal reflux disease) 01/20/2014  . FTT (failure to thrive) in adult 01/08/2014  . S/P tooth extraction 11/16/2013  . Foot fracture, right 10/01/2013  . Hypotension, unspecified 03/19/2013  . Renal insufficiency 03/19/2013  . Abnormality of gait 02/03/2013  . HTN (hypertension) 09/20/2012  . Unspecified constipation 09/20/2012  . Osteoporosis 11/18/2011  . Wrist fracture 10/07/2010  . WEIGHT LOSS-ABNORMAL 12/03/2009  . Nausea with vomiting 12/03/2009  . Dysphagia, pharyngoesophageal phase 12/03/2009  . Infiltrating ductal carcinoma of right female breast ([place]) 12/02/2009  . Dementia with behavioral disturbance 12/02/2009    Past Surgical History:  Procedure Laterality Date  . BREAST BIOPSY    . BREAST LUMPECTOMY    . COLONOSCOPY   2009   Dr. Fuller Plan: normal  . ESOPHAGOGASTRODUODENOSCOPY  12/11/2009   Dr. Fuller Plan: hiatal hernia  . ESOPHAGOGASTRODUODENOSCOPY N/A 01/29/2014   Procedure: ESOPHAGOGASTRODUODENOSCOPY (EGD);  Surgeon: [name], MD;  Location[facility];  Service: Endoscopy;  Laterality: N/A;  245  . TONSILLECTOMY AND ADENOIDECTOMY      OB History    No data available       Home Medications    Prior to Admission medications   Medication Sig Start Date End Date Taking? Authorizing Provider  acetaminophen (TYLENOL) 325 MG tablet Take one tablet by mouth at bedtime   Yes Historical Provider, MD  amLODipine (NORVASC) 5 MG tablet Take 5 mg by mouth daily.   Yes Historical Provider, MD  cefTRIAXone (ROCEPHIN) IVPB Inject 1 g into the vein daily. Course starting on 06/10/2016 and ending on 06/14/2016   Yes Historical Provider, MD  cloNIDine (CATAPRES) 0.1 MG tablet Take 0.1 mg by mouth daily.   Yes Historical Provider, MD  docusate (COLACE) 50 MG/5ML liquid Take 100 mg by mouth daily.   Yes Historical Provider, MD  magnesium hydroxide (MILK OF MAGNESIA) 400 MG/5ML suspension Take 30 mLs by mouth daily as needed for mild constipation.   Yes Historical Provider, MD  omeprazole (PRILOSEC) 40 MG capsule Take 40 mg by mouth 2 (two) times daily. Until 06/17/16   Yes Historical Provider, MD  ondansetron (ZOFRAN-ODT) 4 MG disintegrating tablet Dissolve 1 tablet in mouth 3 times daily before meals and at bedtime for  nausea   Yes Historical Provider, MD  polyethylene glycol (MIRALAX / GLYCOLAX) packet Take 17 g by mouth daily.     Yes Historical Provider, MD  potassium chloride 20 MEQ/15ML (10%) SOLN Take 40 mEq by mouth daily.   Yes Historical Provider, MD  ZINC OXIDE, TOPICAL, 10 % CREA Apply 1 application topically 3 (three) times daily. Apply sparingly to buttocks d/t redness    Yes Historical Provider, MD    Family History Family History  Problem Relation Age of Onset  . Cancer Sister     breast  . Cancer     . Colon cancer Neg Hx     Social History Social History  Substance Use Topics  . Smoking status: Never Smoker  . Smokeless tobacco: Never Used  . Alcohol use No     Allergies   Aricept [donepezil hydrochloride]; Exelon [rivastigmine tartrate]; and Lithium   Review of Systems Review of Systems  Unable to perform ROS: Dementia  Constitutional: Positive for fatigue and fever.  Gastrointestinal: Positive for vomiting.  Neurological: Positive for weakness.  Psychiatric/Behavioral: Positive for confusion.     Physical Exam Updated Vital Signs BP 168/94 (BP Location: Left Arm)   Pulse 94   Temp 100 F (37.8 C) (Rectal)   Resp 16   Ht 5\' 6"  (1.676 m)   Wt 136 lb (61.7 kg)   SpO2 96%   BMI 21.95 kg/m   Physical Exam  Constitutional: She appears well-developed and well-nourished.  Dry and frail-appearing  HENT:  Head: Normocephalic and atraumatic.  Mouth/Throat: Oropharynx is clear and moist.  Sunken eyes. Dry mucous membranes.  Eyes: EOM are normal. Pupils are equal, round, and reactive to light. No scleral icterus.  Neck: Normal range of motion. Neck supple.  No meningismus  Cardiovascular: Normal rate and regular rhythm.  Exam reveals no friction rub.   No murmur heard. Pulmonary/Chest: Effort normal and breath sounds normal. No respiratory distress. She has no wheezes. She has no rales.  Abdominal: Soft. Bowel sounds are normal. There is no tenderness. There is no rebound and no guarding.  Musculoskeletal: Normal range of motion. She exhibits no edema or tenderness.  Lymphadenopathy:    She has no cervical adenopathy.  Neurological:  Patient is lethargic appearing. Appears to be moving all extremities. She does not follow commands and not answering questions.  Skin: Skin is warm and dry. No rash noted. No erythema.  Nursing note and vitals reviewed.    ED Treatments / Results  Labs (all labs ordered are listed, but only abnormal results are  displayed) Labs Reviewed  COMPREHENSIVE METABOLIC PANEL - Abnormal; Notable for the following:       Result Value   Glucose, Bld 135 (*)    BUN 32 (*)    Creatinine, Ser 1.96 (*)    AST 46 (*)    GFR calc non Af Amer 23 (*)    GFR calc Af Amer 27 (*)    All other components within normal limits  INFLUENZA PANEL BY PCR (TYPE A & B) - Abnormal; Notable for the following:    Influenza A By PCR POSITIVE (*)    All other components within normal limits  CULTURE, BLOOD (ROUTINE X 2)  CULTURE, BLOOD (ROUTINE X 2)  CBC WITH DIFFERENTIAL/PLATELET  CBC  CREATININE, SERUM  TSH  BASIC METABOLIC PANEL  CBC  I-STAT CG4 LACTIC ACID, ED    EKG  EKG Interpretation None       Radiology Dg Chest  Port 1 View  Result Date: 06/10/2016 CLINICAL DATA:  Fever.  Decreased oral intake. EXAM: PORTABLE CHEST 1 VIEW COMPARISON:  01/09/2014 FINDINGS: Normal heart size for technique. Aortic tortuosity accentuated by rightward rotation. There is no edema, consolidation, effusion, or pneumothorax. IMPRESSION: No acute finding. Electronically Signed   By: Monte Fantasia M.D.   On: 06/10/2016 19:23    Procedures Procedures (including critical care time)  Medications Ordered in ED Medications  oseltamivir (TAMIFLU) 6 MG/ML suspension 30 mg (30 mg Oral Given 06/10/16 2156)  pantoprazole (PROTONIX) EC tablet 40 mg (not administered)  acetaminophen (TYLENOL) tablet 325 mg (not administered)  ZINC OXIDE (TOPICAL) 10 % CREA 1 application (not administered)  heparin injection 5,000 Units (not administered)  acetaminophen (TYLENOL) tablet 650 mg (not administered)    Or  acetaminophen (TYLENOL) suppository 650 mg (not administered)  ondansetron (ZOFRAN) tablet 4 mg (not administered)    Or  ondansetron (ZOFRAN) injection 4 mg (not administered)  sodium chloride 0.9 % bolus 1,000 mL (0 mLs Intravenous Stopped 06/10/16 1946)    And  sodium chloride 0.9 % bolus 1,000 mL (0 mLs Intravenous Stopped 06/10/16 2052)      Initial Impression / Assessment and Plan / ED Course  I have reviewed the triage vital signs and the nursing notes.  Pertinent labs & imaging results that were available during my care of the patient were reviewed by me and considered in my medical decision making (see chart for details).     Given IV fluids and Tamiflu in the emergency department. Discussed with hospitalist and we'll admit for dehydration.  Final Clinical Impressions(s) / ED Diagnoses   Final diagnoses:  None    New Prescriptions New Prescriptions   No medications on file     Julianne Rice, MD 06/10/16 2253

## 2016-06-10 NOTE — ED Triage Notes (Signed)
Pt's daughter states pt has had a fever, decreased oral intake and vomiting for the past few days.  States she was able to keep some things down until today.  Was started on Rocephin IM today for UTI.  Sent from [facility].

## 2016-06-11 DIAGNOSIS — N289 Disorder of kidney and ureter, unspecified: Secondary | ICD-10-CM

## 2016-06-11 DIAGNOSIS — E43 Unspecified severe protein-calorie malnutrition: Secondary | ICD-10-CM | POA: Insufficient documentation

## 2016-06-11 DIAGNOSIS — F0281 Dementia in other diseases classified elsewhere with behavioral disturbance: Secondary | ICD-10-CM

## 2016-06-11 DIAGNOSIS — E870 Hyperosmolality and hypernatremia: Secondary | ICD-10-CM

## 2016-06-11 DIAGNOSIS — J101 Influenza due to other identified influenza virus with other respiratory manifestations: Principal | ICD-10-CM

## 2016-06-11 DIAGNOSIS — G308 Other Alzheimer's disease: Secondary | ICD-10-CM

## 2016-06-11 DIAGNOSIS — R1314 Dysphagia, pharyngoesophageal phase: Secondary | ICD-10-CM

## 2016-06-11 DIAGNOSIS — I10 Essential (primary) hypertension: Secondary | ICD-10-CM

## 2016-06-11 LAB — URINE CULTURE: CULTURE: NO GROWTH

## 2016-06-11 LAB — MRSA PCR SCREENING: MRSA BY PCR: NEGATIVE

## 2016-06-11 LAB — CBC
HCT: 45.1 % (ref 36.0–46.0)
HEMOGLOBIN: 14.3 g/dL (ref 12.0–15.0)
MCH: 29.8 pg (ref 26.0–34.0)
MCHC: 31.7 g/dL (ref 30.0–36.0)
MCV: 94 fL (ref 78.0–100.0)
Platelets: 274 10*3/uL (ref 150–400)
RBC: 4.8 MIL/uL (ref 3.87–5.11)
RDW: 14.3 % (ref 11.5–15.5)
WBC: 8.5 10*3/uL (ref 4.0–10.5)

## 2016-06-11 LAB — BASIC METABOLIC PANEL
ANION GAP: 12 (ref 5–15)
BUN: 30 mg/dL — ABNORMAL HIGH (ref 6–20)
CALCIUM: 9.6 mg/dL (ref 8.9–10.3)
CO2: 23 mmol/L (ref 22–32)
Chloride: 114 mmol/L — ABNORMAL HIGH (ref 101–111)
Creatinine, Ser: 1.77 mg/dL — ABNORMAL HIGH (ref 0.44–1.00)
GFR calc non Af Amer: 26 mL/min — ABNORMAL LOW (ref >60)
GFR, EST AFRICAN AMERICAN: 31 mL/min — AB (ref >60)
GLUCOSE: 133 mg/dL — AB (ref 65–99)
POTASSIUM: 4.2 mmol/L (ref 3.5–5.1)
Sodium: 149 mmol/L — ABNORMAL HIGH (ref 135–145)

## 2016-06-11 MED ORDER — ENSURE ENLIVE PO LIQD
237.0000 mL | Freq: Three times a day (TID) | ORAL | Status: DC
  Administered 2016-06-11 (×2): 237 mL via ORAL

## 2016-06-11 MED ORDER — DEXTROSE 5 % IV SOLN
INTRAVENOUS | Status: AC
  Administered 2016-06-11 – 2016-06-12 (×2): via INTRAVENOUS

## 2016-06-11 MED ORDER — CLONIDINE HCL 0.1 MG PO TABS
0.1000 mg | ORAL_TABLET | Freq: Every day | ORAL | Status: DC
  Administered 2016-06-11: 0.1 mg via ORAL
  Filled 2016-06-11 (×2): qty 1

## 2016-06-11 MED ORDER — POLYETHYLENE GLYCOL 3350 17 G PO PACK
17.0000 g | PACK | Freq: Every day | ORAL | Status: DC
  Filled 2016-06-11: qty 1

## 2016-06-11 MED ORDER — BOOST / RESOURCE BREEZE PO LIQD
1.0000 | Freq: Two times a day (BID) | ORAL | Status: DC
  Administered 2016-06-11 (×2): 1 via ORAL

## 2016-06-11 MED ORDER — DOCUSATE SODIUM 100 MG PO CAPS
100.0000 mg | ORAL_CAPSULE | Freq: Every day | ORAL | Status: DC
  Filled 2016-06-11: qty 1

## 2016-06-11 MED ORDER — AMLODIPINE BESYLATE 5 MG PO TABS
5.0000 mg | ORAL_TABLET | Freq: Every day | ORAL | Status: DC
  Administered 2016-06-11 – 2016-06-12 (×2): 5 mg via ORAL
  Filled 2016-06-11 (×2): qty 1

## 2016-06-11 MED ORDER — SODIUM CHLORIDE 0.9 % IV SOLN: INTRAVENOUS | Status: DC

## 2016-06-11 NOTE — Progress Notes (Signed)
PROGRESS NOTE    April STRENG  WSF:[id number]  DOB: Nov 22, 1935  DOA: 06/10/2016 PCP: [name], MD  Hospital course: [name] is an 81 y.o. female with history of dementia, CKD, HTN, breast CA, SNF resident with DNR code status, presented to the ER for fever 101, nausea, vomiting, and decreased oral intake.   She has advanced dementia and was not able to give any complaints. She was given an IM shot with Rocephin at the SNF yesterday.  Work up in the ER showed Influenza A positive, with normal WBC, Hb, and Cr 1.9. Hospitalist was asked to admit her for dehydration, nausea, and the Flu.  Assessment & Plan:   Influenza A:  Continue liquid tamiflu and supportive care.  Volume depletion: Will give gentle IVF.  Pt is starting to drink per daughter.   Dysphagia:  She is on Full liquid only. Do NOT advance her diet.   HTN:  resuming home blood pressure medications.  Hypernatremia: D5W at 65 mL per hour. Recheck in AM.   DVT prophylaxis: sub Q hepain.  Code Status: DNR.  Family Communication: daughter at bedside.  Disposition Plan: to SNF when better.  Consults called: None.   Subjective: Pt is more alert and drinking better per daughter.   Objective: Vitals:   06/10/16 2130 06/10/16 2200 06/10/16 2317 06/11/16 0500  BP: 163/89 168/94 164/93 (!) 166/92  Pulse:  94    Resp: 23 16 18 18   Temp:   99.1 F (37.3 C) 98.8 F (37.1 C)  TempSrc:   Axillary Oral  SpO2:  96% 99% 97%  Weight:   61.7 kg (136 lb)   Height:   5\' 6"  (1.676 m)    No intake or output data in the 24 hours ending 06/11/16 0739 Filed Weights   06/10/16 1803 06/10/16 2317  Weight: 61.7 kg (136 lb) 61.7 kg (136 lb)    Exam:  General exam: awake, alert, NAD, demented. Respiratory system: Clear. No increased work of breathing. Cardiovascular system: S1 & S2 heard. Gastrointestinal system: Abdomen is nondistended, soft and nontender. Normal bowel sounds heard. Central nervous system: Alert and  oriented. No focal neurological deficits. Extremities: no cyanosis.   Data Reviewed: Basic Metabolic Panel:  Recent Labs Lab 06/07/16 0700 06/09/16 0700 06/10/16 1300 06/10/16 1838  NA 140 144 145 144  K 3.4* 3.6 5.0 4.1  CL 103 107 108 107  CO2 29 25 23 24   GLUCOSE 87 128* 165* 135*  BUN 40* 36* 32* 32*  CREATININE 1.67* 1.76* 2.03* 1.96*  CALCIUM 9.4 9.8 10.2 9.9   Liver Function Tests:  Recent Labs Lab 06/10/16 1300 06/10/16 1838  AST 41 46*  ALT 41 51  ALKPHOS 77 75  BILITOT 0.5 0.4  PROT 8.4* 8.1  ALBUMIN 4.4 4.0   No results for input(s): LIPASE, AMYLASE in the last 168 hours. No results for input(s): AMMONIA in the last 168 hours. CBC:  Recent Labs Lab 06/10/16 1300 06/10/16 1838  WBC 9.3 9.3  NEUTROABS 8.3* 7.3  HGB 15.6* 14.2  HCT 49.8* 44.1  MCV 94.3 93.0  PLT 262 256   Cardiac Enzymes: No results for input(s): CKTOTAL, CKMB, CKMBINDEX, TROPONINI in the last 168 hours. CBG (last 3)  No results for input(s): GLUCAP in the last 72 hours. Recent Results (from the past 240 hour(s))  Blood Culture (routine x 2)     Status: None (Preliminary result)   Collection Time: 06/10/16  6:38 PM  Result Value  Ref Range Status   Specimen Description BLOOD RIGHT FOREARM  Final   Special Requests BOTTLES DRAWN AEROBIC AND ANAEROBIC 8CC EACH  Final   Culture PENDING  Incomplete   Report Status PENDING  Incomplete  Blood Culture (routine x 2)     Status: None (Preliminary result)   Collection Time: 06/10/16  7:41 PM  Result Value Ref Range Status   Specimen Description BLOOD LEFT FOREARM  Final   Special Requests BOTTLES DRAWN AEROBIC AND ANAEROBIC [facility] EACH  Final   Culture PENDING  Incomplete   Report Status PENDING  Incomplete     Studies: Dg Chest Port 1 View  Result Date: 06/10/2016 CLINICAL DATA:  Fever.  Decreased oral intake. EXAM: PORTABLE CHEST 1 VIEW COMPARISON:  01/09/2014 FINDINGS: Normal heart size for technique. Aortic tortuosity accentuated  by rightward rotation. There is no edema, consolidation, effusion, or pneumothorax. IMPRESSION: No acute finding. Electronically Signed   By: Monte Fantasia M.D.   On: 06/10/2016 19:23   Scheduled Meds: . feeding supplement (ENSURE ENLIVE)  237 mL Oral BID BM  . heparin  5,000 Units Subcutaneous Q8H  . oseltamivir  30 mg Oral BID  . pantoprazole  40 mg Oral Daily  . ZINC OXIDE (TOPICAL)  1 application Topical TID   Continuous Infusions:  Principal Problem:   Influenza A Active Problems:   Dementia with behavioral disturbance   Dysphagia, pharyngoesophageal phase   HTN (hypertension)   Abnormality of gait   Renal insufficiency   Bipolar disorder ([place])  Time spent:   Irwin Brakeman, MD, FAAFP [facility] Pager [phone]  If 7PM-7AM, please contact night-coverage [email or web address] Password [email or web address] 06/11/2016, 7:39 AM    LOS: 1 day

## 2016-06-11 NOTE — Progress Notes (Signed)
Initial Nutrition Assessment  DOCUMENTATION CODES:  Severe malnutrition in context of acute illness/injury   Pt meets criteria for SEVERE MALNUTRITION in the context of Acute Illness as evidenced by Loss of >5% bw x 1 month and an estimated PO intake that has met </= 50% of needs for >/= 1 month.  INTERVENTION:  Ensure Enlive po TID with meals, each supplement provides 350 kcal and 20 grams of protein (Patient may refer Boost instead)  Boost Breeze po BID between meals, each supplement provides 250 kcal and 9 grams of protein  NUTRITION DIAGNOSIS:  Inadequate oral intake related to nausea, vomiting, acute illness, poor appetite as evidenced by loss of 5% bw x 1 month  GOAL:  Patient will meet greater than or equal to 90% of their needs  MONITOR:  PO intake, Supplement acceptance, Labs, I & O's  REASON FOR ASSESSMENT:  Malnutrition Screening Tool    ASSESSMENT:  81 y/o female PMHx CKD, HTN, Breast Cancer, Gastroparesis, osteoporosis and dementia. Presented with Fever, n/v, and decreased PO intake. Worked up for positive influenza.   RD operating remotely, however, pt known to RD as she is resident at [facility] facility.   At baseline, pt is on a puree diet, but eats very little of her meals and is mostly reliant on nutritional supplements.   Relevant meds/supps taken at facility include- miralax, doc sodium, ppi, potassium, Ensure Clear  Patients PO intake had dropped substantially in the past week due to her N/V  Most recent weight taken at facility was 134 lbs on 2/2. This reflected a loss of 8 lbs x 1 month or 5.5% which is clinically significant for timeframe.   Labs: BG:130-170, hypernatremic, Renal labs improving. No longer febrile Medications: Colace, Ensure Enlive, Tamiflu, PPI, Miralax, D5 IVF   Recent Labs Lab 06/10/16 1300 06/10/16 1838 06/11/16 0550  NA 145 144 149*  K 5.0 4.1 4.2  CL 108 107 114*  CO2 23 24 23   BUN 32* 32* 30*  CREATININE 2.03*  1.96* 1.77*  CALCIUM 10.2 9.9 9.6  GLUCOSE 165* 135* 133*    Diet Order:  Diet full liquid Room service appropriate? Yes; Fluid consistency: Thin  Skin:  Reviewed, no issues  Last BM:  2/2  Height:  Ht Readings from Last 1 Encounters:  06/10/16 5' 6"  (1.676 m)   Weight:  Wt Readings from Last 1 Encounters:  06/10/16 136 lb (61.7 kg)   Wt Readings from Last 10 Encounters:  06/10/16 136 lb (61.7 kg)  02/02/16 139 lb 6.4 oz (63.2 kg)  11/26/15 140 lb 6.4 oz (63.7 kg)  01/20/14 129 lb (58.5 kg)  10/31/13 138 lb (62.6 kg)  10/01/13 142 lb (64.4 kg)  09/05/13 142 lb (64.4 kg)  07/13/13 141 lb 6.4 oz (64.1 kg)  02/19/13 146 lb (66.2 kg)  11/19/12 147 lb 3.2 oz (66.8 kg)   Ideal Body Weight:  59.1 kg  BMI:  Body mass index is 21.95 kg/m.  Estimated Nutritional Needs:  Kcal:  1650-1850 kcals (27-30 kcal/kg bw) Protein:  75-85 (1.2-1.4 g/kg bw) Fluid:  1.7-1.9 L fluid  EDUCATION NEEDS:  No education needs identified at this time  Burtis Junes RD, LDN, [facility] Pager: [phone] 06/11/2016 9:14 AM

## 2016-06-12 ENCOUNTER — Inpatient Hospital Stay
Admission: RE | Admit: 2016-06-12 | Discharge: 2016-06-24 | Disposition: A | Discharge status: Nursing Home (Includes ICF) | Payer: Medicare Other | Source: Ambulatory Visit | Attending: Internal Medicine | Admitting: Internal Medicine

## 2016-06-12 DIAGNOSIS — F3130 Bipolar disorder, current episode depressed, mild or moderate severity, unspecified: Secondary | ICD-10-CM

## 2016-06-12 DIAGNOSIS — E43 Unspecified severe protein-calorie malnutrition: Secondary | ICD-10-CM

## 2016-06-12 LAB — RENAL FUNCTION PANEL
ALBUMIN: 3.4 g/dL — AB (ref 3.5–5.0)
Anion gap: 10 (ref 5–15)
BUN: 36 mg/dL — ABNORMAL HIGH (ref 6–20)
CALCIUM: 9.1 mg/dL (ref 8.9–10.3)
CO2: 25 mmol/L (ref 22–32)
CREATININE: 1.69 mg/dL — AB (ref 0.44–1.00)
Chloride: 108 mmol/L (ref 101–111)
GFR, EST AFRICAN AMERICAN: 32 mL/min — AB (ref >60)
GFR, EST NON AFRICAN AMERICAN: 28 mL/min — AB (ref >60)
Glucose, Bld: 104 mg/dL — ABNORMAL HIGH (ref 65–99)
Phosphorus: 2.3 mg/dL — ABNORMAL LOW (ref 2.5–4.6)
Potassium: 3.6 mmol/L (ref 3.5–5.1)
SODIUM: 143 mmol/L (ref 135–145)

## 2016-06-12 MED ORDER — OSELTAMIVIR PHOSPHATE 6 MG/ML PO SUSR: 30.0000 mg | Freq: Two times a day (BID) | ORAL | 0 refills | Status: AC

## 2016-06-12 MED ORDER — ENSURE ENLIVE PO LIQD: 237.0000 mL | Freq: Three times a day (TID) | ORAL | 0 refills | Status: DC

## 2016-06-12 MED ORDER — BOOST / RESOURCE BREEZE PO LIQD: 1.0000 | Freq: Two times a day (BID) | ORAL | 0 refills | Status: DC

## 2016-06-12 NOTE — Discharge Instructions (Signed)
TAKE TAMIFLU THRU 06/15/16 THEN STOP  URINE CULTURE WAS NEGATIVE FOR INFECTION.

## 2016-06-12 NOTE — Discharge Summary (Signed)
Physician Discharge Summary  [name] JOA:[id number] DOB: 1935-06-10 DOA: 06/10/2016  PCP: [name], MD  Admit date: 06/10/2016 Discharge date: 06/12/2016  Admitted From: SNF Disposition:  SNF  Recommendations for Outpatient Follow-up:  1. Follow up with PCP in 1-2 weeks  Discharge Condition: STABLE CODE STATUS: DNR DIET Ensure Enlive po TID with meals, each supplement provides 350 kcal and 20 grams of protein (Patient may refer Boost instead)  Boost Breeze po BID between meals, each supplement provides 250 kcal and 9 grams of protein  Brief/Interim Summary: HPI: [name] is an 81 y.o. female with history of dementia, CKD, HTN, breast CA, SNF resident with DNR code status, presented to the ER for fever 101, nausea, vomiting, and decreased oral intake.   She has advanced dementia and was not able to give any complaints. She was given an IM shot with Rocephin at the SNF yesterday.  Work up in the ER showed Influenza A positive, with normal WBC, Hb, and Cr 1.9. Hospitalist was asked to admit her for dehydration, nausea, and the Flu.    Influenza A: Continue liquid tamiflu and supportive care. Pt clinically much better now.  Pt complete tamiflu 06/15/16.  Volume depletion: Given gentle IVF. Pt is starting to drink better per daughter.   Dysphagia: She is on Full liquid only. Do NOT advance her diet.   Ensure Enlive po TID with meals, each supplement provides 350 kcal and 20 grams of protein (Patient may refer Boost instead)  Boost Breeze po BID between meals, each supplement provides 250 kcal and 9 grams of protein  HTN: resumed home blood pressure medications.  Hypernatremia: Resolved now.    DVT prophylaxis:sub Q hepain.  Code Status:DNR.  Family Communication:daughter at bedside.  Disposition Plan:to SNF    Discharge Diagnoses:  Principal Problem:   Influenza A Active Problems:   Dementia with behavioral disturbance   Dysphagia, pharyngoesophageal  phase   HTN (hypertension)   Abnormality of gait   Renal insufficiency   Bipolar disorder (HCC)   Protein-calorie malnutrition, severe    Discharge Instructions   Allergies as of 06/12/2016      Reactions   Aricept [donepezil Hydrochloride] Nausea And Vomiting   Exelon [rivastigmine Tartrate]    Lithium Nausea And Vomiting      Medication List    STOP taking these medications   cefTRIAXone IVPB Commonly known as:  ROCEPHIN     TAKE these medications   acetaminophen 325 MG tablet Commonly known as:  TYLENOL Take one tablet by mouth at bedtime   amLODipine 5 MG tablet Commonly known as:  NORVASC Take 5 mg by mouth daily.   cloNIDine 0.1 MG tablet Commonly known as:  CATAPRES Take 0.1 mg by mouth daily.   docusate 50 MG/5ML liquid Commonly known as:  COLACE Take 100 mg by mouth daily.   feeding supplement Liqd Take 1 Container by mouth 2 (two) times daily between meals.   feeding supplement (ENSURE ENLIVE) Liqd Take 237 mLs by mouth 3 (three) times daily with meals.   magnesium hydroxide 400 MG/5ML suspension Commonly known as:  MILK OF MAGNESIA Take 30 mLs by mouth daily as needed for mild constipation.   omeprazole 40 MG capsule Commonly known as:  PRILOSEC Take 40 mg by mouth 2 (two) times daily. Until 06/17/16   ondansetron 4 MG disintegrating tablet Commonly known as:  ZOFRAN-ODT Dissolve 1 tablet in mouth 3 times daily before meals and at bedtime for nausea  oseltamivir 6 MG/ML Susr suspension Commonly known as:  TAMIFLU Take 5 mLs (30 mg total) by mouth 2 (two) times daily.   polyethylene glycol packet Commonly known as:  MIRALAX / GLYCOLAX Take 17 g by mouth daily.   potassium chloride 20 MEQ/15ML (10%) Soln Take 40 mEq by mouth daily.   ZINC OXIDE (TOPICAL) 10 % Crea Apply 1 application topically 3 (three) times daily. Apply sparingly to buttocks d/t redness      Follow-up Information    [name], MD. Schedule an appointment as  soon as possible for a visit in 2 week(s).   Specialty:  Internal Medicine Contact information: [place] [phone] [phone]          Allergies  Allergen Reactions  . Aricept [Donepezil Hydrochloride] Nausea And Vomiting  . Exelon [Rivastigmine Tartrate]   . Lithium Nausea And Vomiting    Procedures/Studies: Dg Chest Port 1 View  Result Date: 06/10/2016 CLINICAL DATA:  Fever.  Decreased oral intake. EXAM: PORTABLE CHEST 1 VIEW COMPARISON:  01/09/2014 FINDINGS: Normal heart size for technique. Aortic tortuosity accentuated by rightward rotation. There is no edema, consolidation, effusion, or pneumothorax. IMPRESSION: No acute finding. Electronically Signed   By: Monte Fantasia M.D.   On: 06/10/2016 19:23    Subjective: Pt drinking much better and much more alert and interactive.   Discharge Exam: Vitals:   06/11/16 2056 06/12/16 0500  BP: 140/74 (!) 150/83  Pulse: 82 75  Resp: 18 18  Temp: 99.8 F (37.7 C) 98.6 F (37 C)   Vitals:   06/11/16 1306 06/11/16 1818 06/11/16 2056 06/12/16 0500  BP: 120/83  140/74 (!) 150/83  Pulse:   82 75  Resp: 18  18 18   Temp: 100.1 F (37.8 C) 99.5 F (37.5 C) 99.8 F (37.7 C) 98.6 F (37 C)  TempSrc: Axillary Axillary Oral Axillary  SpO2: 98%  98% 100%  Weight:      Height:       General exam: awake, alert, NAD, demented. Respiratory system: Clear. No increased work of breathing. Cardiovascular system: S1 & S2 heard. Gastrointestinal system: Abdomen is nondistended, soft and nontender. Normal bowel sounds heard. Central nervous system: Alert and oriented. No focal neurological deficits. Extremities: no cyanosis.   The results of significant diagnostics from this hospitalization (including imaging, microbiology, ancillary and laboratory) are listed below for reference.     Microbiology: Recent Results (from the past 240 hour(s))  Culture, Urine     Status: None   Collection Time: 06/09/16  1:00 PM   Result Value Ref Range Status   Specimen Description URINE, CATHETERIZED  Final   Special Requests NONE  Final   Culture   Final    NO GROWTH Performed at [facility], [address], [place], [place] [address]    Report Status 06/11/2016 FINAL  Final  Blood Culture (routine x 2)     Status: None (Preliminary result)   Collection Time: 06/10/16  6:38 PM  Result Value Ref Range Status   Specimen Description BLOOD RIGHT FOREARM  Final   Special Requests BOTTLES DRAWN AEROBIC AND ANAEROBIC 8CC EACH  Final   Culture NO GROWTH < 24 HOURS  Final   Report Status PENDING  Incomplete  Blood Culture (routine x 2)     Status: None (Preliminary result)   Collection Time: 06/10/16  7:41 PM  Result Value Ref Range Status   Specimen Description BLOOD LEFT FOREARM  Final  Special Requests BOTTLES DRAWN AEROBIC AND ANAEROBIC 6CC EACH  Final   Culture NO GROWTH < 24 HOURS  Final   Report Status PENDING  Incomplete  MRSA PCR Screening     Status: None   Collection Time: 06/11/16  7:43 AM  Result Value Ref Range Status   MRSA by PCR NEGATIVE NEGATIVE Final    Comment:        The GeneXpert MRSA Assay (FDA approved for NASAL specimens only), is one component of a comprehensive MRSA colonization surveillance program. It is not intended to diagnose MRSA infection nor to guide or monitor treatment for MRSA infections.      Labs: BNP (last 3 results) No results for input(s): BNP in the last 8760 hours. Basic Metabolic Panel:  Recent Labs Lab 06/09/16 0700 06/10/16 1300 06/10/16 1838 06/11/16 0550 06/12/16 0546  NA 144 145 144 149* 143  K 3.6 5.0 4.1 4.2 3.6  CL 107 108 107 114* 108  CO2 25 23 24 23 25   GLUCOSE 128* 165* 135* 133* 104*  BUN 36* 32* 32* 30* 36*  CREATININE 1.76* 2.03* 1.96* 1.77* 1.69*  CALCIUM 9.8 10.2 9.9 9.6 9.1  PHOS  --   --   --   --  2.3*   Liver Function Tests:  Recent Labs Lab 06/10/16 1300 06/10/16 1838 06/12/16 0546  AST 41 46*  --   ALT  41 51  --   ALKPHOS 77 75  --   BILITOT 0.5 0.4  --   PROT 8.4* 8.1  --   ALBUMIN 4.4 4.0 3.4*   No results for input(s): LIPASE, AMYLASE in the last 168 hours. No results for input(s): AMMONIA in the last 168 hours. CBC:  Recent Labs Lab 06/10/16 1300 06/10/16 1838 06/11/16 0550  WBC 9.3 9.3 8.5  NEUTROABS 8.3* 7.3  --   HGB 15.6* 14.2 14.3  HCT 49.8* 44.1 45.1  MCV 94.3 93.0 94.0  PLT 262 256 274   Cardiac Enzymes: No results for input(s): CKTOTAL, CKMB, CKMBINDEX, TROPONINI in the last 168 hours. BNP: Invalid input(s): POCBNP CBG: No results for input(s): GLUCAP in the last 168 hours. D-Dimer No results for input(s): DDIMER in the last 72 hours. Hgb A1c No results for input(s): HGBA1C in the last 72 hours. Lipid Profile No results for input(s): CHOL, HDL, LDLCALC, TRIG, CHOLHDL, LDLDIRECT in the last 72 hours. Thyroid function studies  Recent Labs  06/10/16 1906  TSH 0.749   Anemia work up No results for input(s): VITAMINB12, FOLATE, FERRITIN, TIBC, IRON, RETICCTPCT in the last 72 hours. Urinalysis    Component Value Date/Time   COLORURINE YELLOW 06/09/2016 1300   APPEARANCEUR HAZY (A) 06/09/2016 1300   LABSPEC 1.015 06/09/2016 1300   PHURINE 7.0 06/09/2016 1300   GLUCOSEU NEGATIVE 06/09/2016 1300   HGBUR SMALL (A) 06/09/2016 1300   BILIRUBINUR NEGATIVE 06/09/2016 1300   KETONESUR NEGATIVE 06/09/2016 1300   PROTEINUR >300 (A) 06/09/2016 1300   UROBILINOGEN 0.2 01/21/2015 1245   NITRITE NEGATIVE 06/09/2016 1300   LEUKOCYTESUR NEGATIVE 06/09/2016 1300   Sepsis Labs Invalid input(s): PROCALCITONIN,  WBC,  LACTICIDVEN Microbiology Recent Results (from the past 240 hour(s))  Culture, Urine     Status: None   Collection Time: 06/09/16  1:00 PM  Result Value Ref Range Status   Specimen Description URINE, CATHETERIZED  Final   Special Requests NONE  Final   Culture   Final    NO GROWTH Performed at [facility], [place]  [address], [place],  [place] [address]    Report Status 06/11/2016 FINAL  Final  Blood Culture (routine x 2)     Status: None (Preliminary result)   Collection Time: 06/10/16  6:38 PM  Result Value Ref Range Status   Specimen Description BLOOD RIGHT FOREARM  Final   Special Requests BOTTLES DRAWN AEROBIC AND ANAEROBIC 8CC EACH  Final   Culture NO GROWTH < 24 HOURS  Final   Report Status PENDING  Incomplete  Blood Culture (routine x 2)     Status: None (Preliminary result)   Collection Time: 06/10/16  7:41 PM  Result Value Ref Range Status   Specimen Description BLOOD LEFT FOREARM  Final   Special Requests BOTTLES DRAWN AEROBIC AND ANAEROBIC 6CC EACH  Final   Culture NO GROWTH < 24 HOURS  Final   Report Status PENDING  Incomplete  MRSA PCR Screening     Status: None   Collection Time: 06/11/16  7:43 AM  Result Value Ref Range Status   MRSA by PCR NEGATIVE NEGATIVE Final    Comment:        The GeneXpert MRSA Assay (FDA approved for NASAL specimens only), is one component of a comprehensive MRSA colonization surveillance program. It is not intended to diagnose MRSA infection nor to guide or monitor treatment for MRSA infections.    Time coordinating discharge: 31 minutes   SIGNED:  Irwin Brakeman, MD  [facility] 06/12/2016, 10:43 AM Pager   If 7PM-7AM, please contact night-coverage [email or web address] Password [email or web address]

## 2016-06-12 NOTE — Progress Notes (Signed)
Patient with orders to be discharge to [facility]. Report called to [facility], nurse. Discharge packet sent with patient. Patient stable. Patient transported via staff to [facility].

## 2016-06-13 ENCOUNTER — Encounter: Payer: Self-pay | Admitting: Internal Medicine

## 2016-06-13 ENCOUNTER — Non-Acute Institutional Stay (SKILLED_NURSING_FACILITY): Payer: Medicare Other | Admitting: Internal Medicine

## 2016-06-13 ENCOUNTER — Telehealth: Payer: Self-pay

## 2016-06-13 DIAGNOSIS — G308 Other Alzheimer's disease: Secondary | ICD-10-CM

## 2016-06-13 DIAGNOSIS — J101 Influenza due to other identified influenza virus with other respiratory manifestations: Secondary | ICD-10-CM | POA: Diagnosis not present

## 2016-06-13 DIAGNOSIS — R1314 Dysphagia, pharyngoesophageal phase: Secondary | ICD-10-CM | POA: Diagnosis not present

## 2016-06-13 DIAGNOSIS — I10 Essential (primary) hypertension: Secondary | ICD-10-CM | POA: Diagnosis not present

## 2016-06-13 DIAGNOSIS — F0391 Unspecified dementia with behavioral disturbance: Secondary | ICD-10-CM | POA: Diagnosis not present

## 2016-06-13 DIAGNOSIS — G309 Alzheimer's disease, unspecified: Secondary | ICD-10-CM

## 2016-06-13 DIAGNOSIS — R112 Nausea with vomiting, unspecified: Secondary | ICD-10-CM | POA: Diagnosis not present

## 2016-06-13 DIAGNOSIS — K3184 Gastroparesis: Secondary | ICD-10-CM | POA: Diagnosis not present

## 2016-06-13 DIAGNOSIS — K219 Gastro-esophageal reflux disease without esophagitis: Secondary | ICD-10-CM | POA: Diagnosis not present

## 2016-06-13 DIAGNOSIS — Z853 Personal history of malignant neoplasm of breast: Secondary | ICD-10-CM | POA: Diagnosis not present

## 2016-06-13 DIAGNOSIS — F0281 Dementia in other diseases classified elsewhere with behavioral disturbance: Secondary | ICD-10-CM

## 2016-06-13 DIAGNOSIS — Z9181 History of falling: Secondary | ICD-10-CM | POA: Diagnosis not present

## 2016-06-13 MED FILL — Zinc Oxide Oint 20%: CUTANEOUS | Qty: 28.35 | Status: AC

## 2016-06-13 NOTE — Telephone Encounter (Signed)
Possible re-admission to facility. This is a patient you were seeing at [facility] . [facility] F/U is needed if patient was re-admitted to facility upon discharge. Hospital discharge from [facility] on 06/12/16.

## 2016-06-13 NOTE — Progress Notes (Signed)
Provider:  Veleta Miners Location:   [facility] Room Number: 110/W Place of Service:  SNF (31)  PCP: [name], MD Patient Care Team: [name], MD as PCP - General (Internal Medicine) [name], MD as Consulting Physician (Gastroenterology)  Extended Emergency Contact Information Primary Emergency Contact: [facility] Address: [address]          [place], [place] [address] [name] of [place] Phone: [phone] Work Phone: [phone] Mobile Phone: [phone] Relation: Daughter Secondary Emergency Contact: [name] Address: [address]          [place], [place] [address] [name] of [place] Phone: [phone] Mobile Phone: [phone] Relation: Relative  Code Status: DNR Goals of Care: Advanced Directive information Advanced Directives 06/13/2016  Does Patient Have a Medical Advance Directive? Yes  Type of Advance Directive Out of facility DNR (pink MOST or yellow form)  Does patient want to make changes to medical advance directive? No - Patient declined  Copy of [place] in Chart? -  Would patient like information on creating a medical advance directive? No - Patient declined  Pre-existing out of facility DNR order (yellow form or pink MOST form) -      Chief Complaint  Patient presents with  . Readmit To SNF    HPI: Patient is a 81 y.o. female seen today for admission to SNF for long term Care Patient has h/o hypertension, Advanced Dementia, Chronic renal disease, Breast cancer s/p Mastectomy.  She ws admitted to the hospital with Dehydration and Influenza. Patient spiked fever in the facility and was having Nausea and Vomiting. She was admired in the hospital and was treated with IV hydration and Tamiflu. She is discharged to facility with Continuous care.  Patient has significant Dementia and is unable ot give me any history. She has been doing well in facility but continues to  have decrease oral intake. She has not had any more fever or chills. No vomiting or abdominal pain. She has been mostly staying in her bed. Past Medical History:  Diagnosis Date  . Cancer ([place])   . Dementia   . Gallstones   . Gastroparalysis   . HTN (hypertension)   . Infiltrating ductal carcinoma of right female breast ([place]) 12/02/2009   Qualifier: History of  By: [name] CMA [name]), June    . Osteoporosis 11/18/2011  . Reflux   . Renal disorder    renal insufficiency.    Past Surgical History:  Procedure Laterality Date  . BREAST BIOPSY    . BREAST LUMPECTOMY    . COLONOSCOPY  2009   Dr. Fuller Plan: normal  . ESOPHAGOGASTRODUODENOSCOPY  12/11/2009   Dr. Fuller Plan: hiatal hernia  . ESOPHAGOGASTRODUODENOSCOPY N/A 01/29/2014   Procedure: ESOPHAGOGASTRODUODENOSCOPY (EGD);  Surgeon: [name], MD;  [facility];  Service: Endoscopy;  Laterality: N/A;  245  . TONSILLECTOMY AND ADENOIDECTOMY      reports that she has never smoked. She has never used smokeless tobacco. She reports that she does not drink alcohol or use drugs. Social History   Social History  . Marital status: Divorced    Spouse name: N/A  . Number of children: 2  . Years of education: college   Occupational History  . retired Retired   Social History Main Topics  . Smoking status: Never Smoker  . Smokeless tobacco: Never Used  . Alcohol use No  . Drug use: No  . Sexual activity: Not on file  Other Topics Concern  . Not on file   Social History Narrative  . No narrative on file    Functional Status Survey:    Family History  Problem Relation Age of Onset  . Cancer Sister     breast  . Cancer    . Colon cancer Neg Hx     Health Maintenance  Topic Date Due  . ZOSTAVAX  10/05/2016 (Originally 01/12/1998)  . PNA vac Low Risk Adult (1 of 2 - PCV13) 10/05/2016 (Originally 01/13/2003)  . TETANUS/TDAP  10/05/2025 (Originally 01/12/1955)  . INFLUENZA VACCINE  Completed  . DEXA SCAN  Completed     Allergies  Allergen Reactions  . Aricept [Donepezil Hydrochloride] Nausea And Vomiting  . Exelon [Rivastigmine Tartrate]   . Lithium Nausea And Vomiting    Allergies as of 06/13/2016      Reactions   Aricept [donepezil Hydrochloride] Nausea And Vomiting   Exelon [rivastigmine Tartrate]    Lithium Nausea And Vomiting      Medication List       Accurate as of 06/13/16 11:23 AM. Always use your most recent med list.          acetaminophen 325 MG tablet Commonly known as:  TYLENOL Take one tablet by mouth at bedtime   amLODipine 5 MG tablet Commonly known as:  NORVASC Take 5 mg by mouth daily.   cloNIDine 0.1 MG tablet Commonly known as:  CATAPRES Take 0.1 mg by mouth daily.   docusate 50 MG/5ML liquid Commonly known as:  COLACE Take 100 mg by mouth daily.   feeding supplement Liqd Take 1 Container by mouth 2 (two) times daily between meals.   feeding supplement (ENSURE ENLIVE) Liqd Take 237 mLs by mouth 3 (three) times daily with meals.   magnesium hydroxide 400 MG/5ML suspension Commonly known as:  MILK OF MAGNESIA Take 30 mLs by mouth daily as needed for mild constipation.   omeprazole 40 MG capsule Commonly known as:  PRILOSEC Take 40 mg by mouth 2 (two) times daily. Until 06/17/16   ondansetron 4 MG disintegrating tablet Commonly known as:  ZOFRAN-ODT Dissolve 1 tablet in mouth 3 times daily before meals and at bedtime for nausea   oseltamivir 6 MG/ML Susr suspension Commonly known as:  TAMIFLU Take 5 mLs (30 mg total) by mouth 2 (two) times daily.   polyethylene glycol packet Commonly known as:  MIRALAX / GLYCOLAX Take 17 g by mouth daily.   potassium chloride 20 MEQ/15ML (10%) Soln Take 40 mEq by mouth daily.   ZINC OXIDE (TOPICAL) 10 % Crea Apply 1 application topically 3 (three) times daily. Apply sparingly to buttocks d/t redness       Review of Systems  Unable to perform ROS: Dementia    Vitals:   06/13/16 1115  BP: (!) 160/92   Pulse: 67  Resp: 16  Temp: 99.1 F (37.3 C)  TempSrc: Oral   There is no height or weight on file to calculate BMI. Physical Exam  Constitutional: She appears well-developed and well-nourished.  HENT:  Head: Normocephalic.  Mouth/Throat: Oropharynx is clear and moist.  Eyes: Pupils are equal, round, and reactive to light.  Neck: Neck supple.  Cardiovascular: Normal rate, regular rhythm and normal heart sounds.   No murmur heard. Pulmonary/Chest: Effort normal and breath sounds normal. No respiratory distress. She has no wheezes. She has no rales. She exhibits no tenderness.  Abdominal: Soft. Bowel sounds are normal. She exhibits no distension. There is no tenderness. There  is no rebound.  Musculoskeletal: She exhibits no edema.  Lymphadenopathy:    She has no cervical adenopathy.  Neurological: She is alert.  Not oriented. Moving all Extremities well.  Skin: Skin is warm and dry. No rash noted. No erythema.  Psychiatric: She has a normal mood and affect. Her behavior is normal.    Labs reviewed: Basic Metabolic Panel:  Recent Labs  11/27/15 0725  04/11/16 0700  06/02/16 0700  06/10/16 1838 06/11/16 0550 06/12/16 0546  NA 141  < > 139  < > 143  < > 144 149* 143  K 3.6  < > 3.9  < > 3.6  < > 4.1 4.2 3.6  CL 104  < > 103  < > 105  < > 107 114* 108  CO2 28  < > 27  < > 28  < > 24 23 25   GLUCOSE 83  < > 89  < > 101*  < > 135* 133* 104*  BUN 39*  < > 40*  < > 37*  < > 32* 30* 36*  CREATININE 1.86*  < > 1.50*  < > 1.53*  < > 1.96* 1.77* 1.69*  CALCIUM 9.4  < > 9.4  < > 9.6  < > 9.9 9.6 9.1  MG 2.4  --  2.6*  --  2.6*  --   --   --   --   PHOS  --   --   --   --   --   --   --   --  2.3*  < > = values in this interval not displayed. Liver Function Tests:  Recent Labs  05/25/16 0500 06/10/16 1300 06/10/16 1838 06/12/16 0546  AST 25 41 46*  --   ALT 19 41 51  --   ALKPHOS 70 77 75  --   BILITOT 0.7 0.5 0.4  --   PROT 7.3 8.4* 8.1  --   ALBUMIN 3.9 4.4 4.0 3.4*     Recent Labs  05/25/16 0500  LIPASE 31  AMYLASE 133*   No results for input(s): AMMONIA in the last 8760 hours. CBC:  Recent Labs  05/24/16 0500 06/10/16 1300 06/10/16 1838 06/11/16 0550  WBC 7.5 9.3 9.3 8.5  NEUTROABS 5.8 8.3* 7.3  --   HGB 13.1 15.6* 14.2 14.3  HCT 41.6 49.8* 44.1 45.1  MCV 93.1 94.3 93.0 94.0  PLT 223 262 256 274   Cardiac Enzymes: No results for input(s): CKTOTAL, CKMB, CKMBINDEX, TROPONINI in the last 8760 hours. BNP: Invalid input(s): POCBNP Lab Results  Component Value Date   HGBA1C  05/17/2007    5.4 (NOTE)   The ADA recommends the following therapeutic goals for glycemic   control related to Hgb A1C measurement:   Goal of Therapy:   < 7.0% Hgb A1C   Action Suggested:  > 8.0% Hgb A1C   Ref:  Diabetes Care, 22, Suppl. 1, 1999   Lab Results  Component Value Date   TSH 0.749 06/10/2016   Lab Results  Component Value Date   QTMAUQJF35 456 10/31/2013   Lab Results  Component Value Date   FOLATE >20.0 10/31/2013   No results found for: IRON, TIBC, FERRITIN  Imaging and Procedures obtained prior to SNF admission: Dg Chest Port 1 View  Result Date: 06/10/2016 CLINICAL DATA:  Fever.  Decreased oral intake. EXAM: PORTABLE CHEST 1 VIEW COMPARISON:  01/09/2014 FINDINGS: Normal heart size for technique. Aortic tortuosity accentuated by rightward rotation. There is  no edema, consolidation, effusion, or pneumothorax. IMPRESSION: No acute finding. Electronically Signed   By: Monte Fantasia M.D.   On: 06/10/2016 19:23    Assessment/Plan  Influenza A Patient is on Tamiflu. She is afebrile and not having any respiratory distress  Dehydration with decrease PO intake Patient is taking some PO but overall decrease intake. Will monitor her Labs. Her creat had come to baseline in the hospital with IV fkuids. Repeat BMP   Essential hypertension Her BP is slightly elevated. She had not taken her medicines this morning and she took it now. So  continue to monitor. She was now also on Clonidine.  Dysphagia D/W daughter will have her follow with GI after her present symptoms of Flu Are resolved.  Gastroesophageal reflux disease, Continue on Protonix  Alzheimer's dementia  Continue supportive care With decreased intake I d/w daughter about plans going forward. It can be also due to her worsening dementia. Will wait for GI input next week. Family/ staff Communication:   Labs/tests ordered: CBC and BMP  Total time spent in this patient care encounter was45 _ minutes; greater than 50% of the visit spent counseling patient and coordinating care for problems addressed at this encounter.

## 2016-06-14 DIAGNOSIS — Z9181 History of falling: Secondary | ICD-10-CM | POA: Diagnosis not present

## 2016-06-14 DIAGNOSIS — K3184 Gastroparesis: Secondary | ICD-10-CM | POA: Diagnosis not present

## 2016-06-14 DIAGNOSIS — R112 Nausea with vomiting, unspecified: Secondary | ICD-10-CM | POA: Diagnosis not present

## 2016-06-14 DIAGNOSIS — I10 Essential (primary) hypertension: Secondary | ICD-10-CM | POA: Diagnosis not present

## 2016-06-14 DIAGNOSIS — F0391 Unspecified dementia with behavioral disturbance: Secondary | ICD-10-CM | POA: Diagnosis not present

## 2016-06-14 DIAGNOSIS — Z853 Personal history of malignant neoplasm of breast: Secondary | ICD-10-CM | POA: Diagnosis not present

## 2016-06-15 ENCOUNTER — Encounter (HOSPITAL_COMMUNITY)
Admission: RE | Admit: 2016-06-15 | Discharge: 2016-06-15 | Disposition: A | Discharge status: Home / Self Care | Payer: Medicare Other | Source: Skilled Nursing Facility | Attending: Internal Medicine | Admitting: Internal Medicine

## 2016-06-15 DIAGNOSIS — F0391 Unspecified dementia with behavioral disturbance: Secondary | ICD-10-CM | POA: Insufficient documentation

## 2016-06-15 DIAGNOSIS — Z9181 History of falling: Secondary | ICD-10-CM | POA: Insufficient documentation

## 2016-06-15 DIAGNOSIS — I10 Essential (primary) hypertension: Secondary | ICD-10-CM | POA: Insufficient documentation

## 2016-06-15 DIAGNOSIS — F319 Bipolar disorder, unspecified: Secondary | ICD-10-CM | POA: Diagnosis not present

## 2016-06-15 DIAGNOSIS — J112 Influenza due to unidentified influenza virus with gastrointestinal manifestations: Secondary | ICD-10-CM | POA: Insufficient documentation

## 2016-06-15 DIAGNOSIS — K3184 Gastroparesis: Secondary | ICD-10-CM | POA: Diagnosis not present

## 2016-06-15 DIAGNOSIS — R112 Nausea with vomiting, unspecified: Secondary | ICD-10-CM | POA: Diagnosis not present

## 2016-06-15 DIAGNOSIS — Z853 Personal history of malignant neoplasm of breast: Secondary | ICD-10-CM | POA: Diagnosis not present

## 2016-06-15 LAB — CBC
HCT: 41.1 % (ref 36.0–46.0)
HEMOGLOBIN: 13.1 g/dL (ref 12.0–15.0)
MCH: 29.3 pg (ref 26.0–34.0)
MCHC: 31.9 g/dL (ref 30.0–36.0)
MCV: 91.9 fL (ref 78.0–100.0)
PLATELETS: 231 10*3/uL (ref 150–400)
RBC: 4.47 MIL/uL (ref 3.87–5.11)
RDW: 13.8 % (ref 11.5–15.5)
WBC: 9.4 10*3/uL (ref 4.0–10.5)

## 2016-06-15 LAB — BASIC METABOLIC PANEL
ANION GAP: 11 (ref 5–15)
BUN: 46 mg/dL — AB (ref 6–20)
CO2: 27 mmol/L (ref 22–32)
Calcium: 9.3 mg/dL (ref 8.9–10.3)
Chloride: 105 mmol/L (ref 101–111)
Creatinine, Ser: 1.7 mg/dL — ABNORMAL HIGH (ref 0.44–1.00)
GFR calc Af Amer: 32 mL/min — ABNORMAL LOW (ref >60)
GFR, EST NON AFRICAN AMERICAN: 28 mL/min — AB (ref >60)
GLUCOSE: 92 mg/dL (ref 65–99)
POTASSIUM: 3.9 mmol/L (ref 3.5–5.1)
Sodium: 143 mmol/L (ref 135–145)

## 2016-06-15 LAB — CULTURE, BLOOD (ROUTINE X 2)
CULTURE: NO GROWTH
Culture: NO GROWTH

## 2016-06-16 DIAGNOSIS — R112 Nausea with vomiting, unspecified: Secondary | ICD-10-CM | POA: Diagnosis not present

## 2016-06-16 DIAGNOSIS — F0391 Unspecified dementia with behavioral disturbance: Secondary | ICD-10-CM | POA: Diagnosis not present

## 2016-06-16 DIAGNOSIS — Z9181 History of falling: Secondary | ICD-10-CM | POA: Diagnosis not present

## 2016-06-16 DIAGNOSIS — K3184 Gastroparesis: Secondary | ICD-10-CM | POA: Diagnosis not present

## 2016-06-16 DIAGNOSIS — I10 Essential (primary) hypertension: Secondary | ICD-10-CM | POA: Diagnosis not present

## 2016-06-16 DIAGNOSIS — Z853 Personal history of malignant neoplasm of breast: Secondary | ICD-10-CM | POA: Diagnosis not present

## 2016-06-17 ENCOUNTER — Ambulatory Visit (INDEPENDENT_AMBULATORY_CARE_PROVIDER_SITE_OTHER): Payer: Medicare Other | Admitting: Internal Medicine

## 2016-06-17 ENCOUNTER — Other Ambulatory Visit: Payer: Self-pay

## 2016-06-17 ENCOUNTER — Encounter: Payer: Self-pay | Admitting: Internal Medicine

## 2016-06-17 VITALS — BP 112/75 | HR 61 | Temp 97.4°F | Ht 67.0 in | Wt 134.0 lb

## 2016-06-17 DIAGNOSIS — K3184 Gastroparesis: Secondary | ICD-10-CM | POA: Diagnosis not present

## 2016-06-17 DIAGNOSIS — K8020 Calculus of gallbladder without cholecystitis without obstruction: Secondary | ICD-10-CM

## 2016-06-17 DIAGNOSIS — R634 Abnormal weight loss: Secondary | ICD-10-CM

## 2016-06-17 DIAGNOSIS — K219 Gastro-esophageal reflux disease without esophagitis: Secondary | ICD-10-CM | POA: Diagnosis not present

## 2016-06-17 DIAGNOSIS — I10 Essential (primary) hypertension: Secondary | ICD-10-CM | POA: Diagnosis not present

## 2016-06-17 DIAGNOSIS — F5000 Anorexia nervosa, unspecified: Secondary | ICD-10-CM | POA: Diagnosis not present

## 2016-06-17 DIAGNOSIS — R1319 Other dysphagia: Secondary | ICD-10-CM | POA: Diagnosis not present

## 2016-06-17 DIAGNOSIS — K5900 Constipation, unspecified: Secondary | ICD-10-CM

## 2016-06-17 DIAGNOSIS — K5904 Chronic idiopathic constipation: Secondary | ICD-10-CM | POA: Diagnosis not present

## 2016-06-17 DIAGNOSIS — R112 Nausea with vomiting, unspecified: Secondary | ICD-10-CM | POA: Diagnosis not present

## 2016-06-17 DIAGNOSIS — Z853 Personal history of malignant neoplasm of breast: Secondary | ICD-10-CM | POA: Diagnosis not present

## 2016-06-17 DIAGNOSIS — F0391 Unspecified dementia with behavioral disturbance: Secondary | ICD-10-CM | POA: Diagnosis not present

## 2016-06-17 DIAGNOSIS — R1310 Dysphagia, unspecified: Secondary | ICD-10-CM

## 2016-06-17 DIAGNOSIS — Z9181 History of falling: Secondary | ICD-10-CM | POA: Diagnosis not present

## 2016-06-17 NOTE — Patient Instructions (Signed)
Barium pill esophogram to evaluate dysphagia   Gallbladder ultrasound - known gallstones; pt with dementia and anorexia  KUB to evaluate stool load in colon  Further recommendations to follow

## 2016-06-17 NOTE — Progress Notes (Signed)
Primary Care Physician:  [name], MD Primary Gastroenterologist:  Dr. [name]  Pre-Procedure History & Physical: HPI:  [name] is a 81 y.o. female here for from the [facility] to further evaluate failure to thrive,  diminished oral intake. Multiple GI issues including known gallstones, gastroparesis,  constipation and reflux.  Her daughter, who is very much on top of things, accompanies her today and provides all of the history.  Apparently, patient easily vomits. Symptoms may be worse in the morning. She has seen speech pathology. Further GI evaluation recommended.`  Patient's oral intake has fallen off recently;  typically subsist on liquids and not solids. Has been on various PPIs reflux over the years. Was on Reglan previously for delayed gastric emptying on a GE S. EGD by me in 2015 demonstrated only gastric and duodenal erosions. Biopsies-non-H pylori gastritis.  Recently had a febrile illness. Reportedly diagnosed with influenza A. She improved with Tamiflu. Is not clear whether patient has much had much abdominal pain recent. No history of melena or rectal bleeding. Past Medical History:  Diagnosis Date  . Cancer ([place])   . Dementia   . Gallstones   . Gastroparalysis   . HTN (hypertension)   . Infiltrating ductal carcinoma of right female breast ([name]) 12/02/2009   Qualifier: History of  By: [name] CMA [name]), June    . Osteoporosis 11/18/2011  . Reflux   . Renal disorder    renal insufficiency.     Past Surgical History:  Procedure Laterality Date  . BREAST BIOPSY    . BREAST LUMPECTOMY    . COLONOSCOPY  2009   Dr. Fuller Plan: normal  . ESOPHAGOGASTRODUODENOSCOPY  12/11/2009   Dr. Fuller Plan: hiatal hernia  . ESOPHAGOGASTRODUODENOSCOPY N/A 01/29/2014   Procedure: ESOPHAGOGASTRODUODENOSCOPY (EGD);  Surgeon: [name], MD;  [facility];  Service: Endoscopy;  Laterality: N/A;  245  . TONSILLECTOMY AND ADENOIDECTOMY      Prior to Admission  medications   Medication Sig Start Date End Date Taking? Authorizing Provider  acetaminophen (TYLENOL) 325 MG tablet Take one tablet by mouth at bedtime   Yes Historical Provider, MD  amLODipine (NORVASC) 5 MG tablet Take 5 mg by mouth daily.   Yes Historical Provider, MD  cloNIDine (CATAPRES) 0.1 MG tablet Take 0.1 mg by mouth daily.   Yes Historical Provider, MD  docusate (COLACE) 50 MG/5ML liquid Take 100 mg by mouth daily.   Yes Historical Provider, MD  feeding supplement (BOOST / RESOURCE BREEZE) LIQD Take 1 Container by mouth 2 (two) times daily between meals. 06/12/16 07/12/16 Yes Clanford Marisa Hua, MD  feeding supplement, ENSURE ENLIVE, (ENSURE ENLIVE) LIQD Take 237 mLs by mouth 3 (three) times daily with meals. 06/12/16 07/12/16 Yes Clanford Marisa Hua, MD  magnesium hydroxide (MILK OF MAGNESIA) 400 MG/5ML suspension Take 30 mLs by mouth daily as needed for mild constipation.   Yes Historical Provider, MD  omeprazole (PRILOSEC) 40 MG capsule Take 40 mg by mouth 2 (two) times daily. Until 06/17/16   Yes Historical Provider, MD  ondansetron (ZOFRAN-ODT) 4 MG disintegrating tablet Dissolve 1 tablet in mouth 3 times daily before meals and at bedtime for nausea   Yes Historical Provider, MD  polyethylene glycol (MIRALAX / GLYCOLAX) packet Take 17 g by mouth daily.     Yes Historical Provider, MD  potassium chloride 20 MEQ/15ML (10%) SOLN Take 40 mEq by mouth daily.   Yes Historical Provider, MD  ZINC OXIDE, TOPICAL, 10 %  CREA Apply 1 application topically 3 (three) times daily. Apply sparingly to buttocks d/t redness    Yes Historical Provider, MD    Allergies as of 06/17/2016 - Review Complete 06/17/2016  Allergen Reaction Noted  . Aricept [donepezil hydrochloride] Nausea And Vomiting 12/24/2010  . Exelon [rivastigmine tartrate]  11/26/2015  . Lithium Nausea And Vomiting 12/24/2010    Family History  Problem Relation Age of Onset  . Cancer Sister     breast  . Cancer    . Colon cancer Neg Hx       Social History   Social History  . Marital status: Divorced    Spouse name: N/A  . Number of children: 2  . Years of education: college   Occupational History  . retired Retired   Social History Main Topics  . Smoking status: Never Smoker  . Smokeless tobacco: Never Used  . Alcohol use No  . Drug use: No  . Sexual activity: Not on file   Other Topics Concern  . Not on file   Social History Narrative  . No narrative on file    Review of Systems: See HPI, otherwise negative ROS  Physical Exam: BP 112/75   Pulse 61   Temp 97.4 F (36.3 C) (Oral)   Ht 5\' 7"  (1.702 m)   Wt 134 lb (60.8 kg)   BMI 20.99 kg/m  General:   Sleepy. Confined to the wheelchair. Nonverbal. Accompanied by family members.  Impression: Nonverbal Debilitated 53 -year-old lady with multiple co-morbidities here with basically failure to thrive over the past couple of weeks. She has known gallstones,  possibly a degree of cholecystitis-chronic. Biliary dyskinesia on HIDA. Also, gastroparesis noted on prior GES with secondary GERD. Incidentally, patient has not had any significant abdominal pain although history is difficult. Fortunately, patient's daughter was very helpful today in providing insight.  There could be a combination of GI issues ongoing which may be contributing to failure to thrive. Progressive neurological decline in the way of dementia may be underlying her GI issues.  Impression:  Barium pill esophogram to evaluate dysphagia   Gallbladder ultrasound - known gallstones; pt with dementia and anorexia  KUB to evaluate stool load in colon  Further recommendations to follow     Notice: This dictation was prepared with Dragon dictation along with smaller phrase technology. Any transcriptional errors that result from this process are unintentional and may not be corrected upon review.

## 2016-06-17 NOTE — Progress Notes (Signed)
Barium pill esophogram to evaluate dysphagia   Gallbladder ultrasound - known gallstones; pt with dementia and anorexia  KUB to evaluate stool load in colon  Further recommendations to follow

## 2016-06-20 DIAGNOSIS — Z853 Personal history of malignant neoplasm of breast: Secondary | ICD-10-CM | POA: Diagnosis not present

## 2016-06-20 DIAGNOSIS — R112 Nausea with vomiting, unspecified: Secondary | ICD-10-CM | POA: Diagnosis not present

## 2016-06-20 DIAGNOSIS — Z9181 History of falling: Secondary | ICD-10-CM | POA: Diagnosis not present

## 2016-06-20 DIAGNOSIS — K3184 Gastroparesis: Secondary | ICD-10-CM | POA: Diagnosis not present

## 2016-06-20 DIAGNOSIS — I10 Essential (primary) hypertension: Secondary | ICD-10-CM | POA: Diagnosis not present

## 2016-06-20 DIAGNOSIS — F0391 Unspecified dementia with behavioral disturbance: Secondary | ICD-10-CM | POA: Diagnosis not present

## 2016-06-21 DIAGNOSIS — I10 Essential (primary) hypertension: Secondary | ICD-10-CM | POA: Diagnosis not present

## 2016-06-21 DIAGNOSIS — Z9181 History of falling: Secondary | ICD-10-CM | POA: Diagnosis not present

## 2016-06-21 DIAGNOSIS — R112 Nausea with vomiting, unspecified: Secondary | ICD-10-CM | POA: Diagnosis not present

## 2016-06-21 DIAGNOSIS — F0391 Unspecified dementia with behavioral disturbance: Secondary | ICD-10-CM | POA: Diagnosis not present

## 2016-06-21 DIAGNOSIS — K3184 Gastroparesis: Secondary | ICD-10-CM | POA: Diagnosis not present

## 2016-06-21 DIAGNOSIS — Z853 Personal history of malignant neoplasm of breast: Secondary | ICD-10-CM | POA: Diagnosis not present

## 2016-06-22 DIAGNOSIS — I10 Essential (primary) hypertension: Secondary | ICD-10-CM | POA: Diagnosis not present

## 2016-06-22 DIAGNOSIS — K3184 Gastroparesis: Secondary | ICD-10-CM | POA: Diagnosis not present

## 2016-06-22 DIAGNOSIS — R112 Nausea with vomiting, unspecified: Secondary | ICD-10-CM | POA: Diagnosis not present

## 2016-06-22 DIAGNOSIS — Z853 Personal history of malignant neoplasm of breast: Secondary | ICD-10-CM | POA: Diagnosis not present

## 2016-06-22 DIAGNOSIS — F0391 Unspecified dementia with behavioral disturbance: Secondary | ICD-10-CM | POA: Diagnosis not present

## 2016-06-22 DIAGNOSIS — Z9181 History of falling: Secondary | ICD-10-CM | POA: Diagnosis not present

## 2016-06-23 ENCOUNTER — Ambulatory Visit (HOSPITAL_COMMUNITY)
Admit: 2016-06-23 | Discharge: 2016-06-23 | Disposition: A | Discharge status: Home / Self Care | Payer: Medicare Other | Attending: Internal Medicine | Admitting: Internal Medicine

## 2016-06-23 DIAGNOSIS — K8020 Calculus of gallbladder without cholecystitis without obstruction: Secondary | ICD-10-CM | POA: Diagnosis not present

## 2016-06-23 DIAGNOSIS — K224 Dyskinesia of esophagus: Secondary | ICD-10-CM | POA: Insufficient documentation

## 2016-06-23 DIAGNOSIS — F5000 Anorexia nervosa, unspecified: Secondary | ICD-10-CM | POA: Diagnosis not present

## 2016-06-23 DIAGNOSIS — K449 Diaphragmatic hernia without obstruction or gangrene: Secondary | ICD-10-CM | POA: Diagnosis not present

## 2016-06-23 DIAGNOSIS — R1310 Dysphagia, unspecified: Secondary | ICD-10-CM | POA: Diagnosis not present

## 2016-06-24 ENCOUNTER — Telehealth: Payer: Self-pay | Admitting: Internal Medicine

## 2016-06-24 ENCOUNTER — Inpatient Hospital Stay
Admission: RE | Admit: 2016-06-24 | Discharge: 2018-08-10 | Disposition: A | Discharge status: Home / Self Care | Payer: Medicare Other | Source: Ambulatory Visit | Attending: Internal Medicine | Admitting: Internal Medicine

## 2016-06-24 ENCOUNTER — Encounter (HOSPITAL_COMMUNITY): Payer: Self-pay | Admitting: Emergency Medicine

## 2016-06-24 ENCOUNTER — Emergency Department (HOSPITAL_COMMUNITY)
Admission: EM | Admit: 2016-06-24 | Discharge: 2016-06-24 | Disposition: A | Discharge status: Home / Self Care | Payer: Medicare Other | Attending: Dermatology | Admitting: Dermatology

## 2016-06-24 DIAGNOSIS — I10 Essential (primary) hypertension: Secondary | ICD-10-CM | POA: Diagnosis not present

## 2016-06-24 DIAGNOSIS — K5900 Constipation, unspecified: Secondary | ICD-10-CM | POA: Diagnosis not present

## 2016-06-24 DIAGNOSIS — Z5321 Procedure and treatment not carried out due to patient leaving prior to being seen by health care provider: Secondary | ICD-10-CM | POA: Diagnosis not present

## 2016-06-24 DIAGNOSIS — R112 Nausea with vomiting, unspecified: Secondary | ICD-10-CM | POA: Diagnosis not present

## 2016-06-24 NOTE — Telephone Encounter (Signed)
Patient actively vomiting at the nursing home. Barium swallow recently demonstrated no obvious obstruction. Gallbladder ultrasound again reconfirmed cholelithiasis and now thickening of the gallbladder wall.  I had also ordered a KUB but they did not perform this study while she was there. Of note, did have a plain film at the nursing home last month which revealed a large amount of stool in her colon. Treatment recommended. Also,  a follow-up KUB was recommended-apparently not done.  This lady's dementia and poor communication skills makes her evaluation challenging. History of severe biliary dyskinesia/ cholelithiasis and possible cholecystitis now could easily be contributing to her failure to thrive/nausea etc.  I recommend she go to the ED for further evaluation. She should have, at a minimum, PLAIN films to at least assess stool load in her colon. If she is not significantly obstipated, I would recommend surgical consultation for cholecystectomy

## 2016-06-24 NOTE — Telephone Encounter (Signed)
[name] at the [facility] center called- pt was vomiting most of the day yesterday and has vomited once today. pts daughter is concerned and wants to know if RMR can do anything about it?  Pt has zofran on her med list and pt is not eating anything solid- only liquids.

## 2016-06-24 NOTE — Telephone Encounter (Signed)
Pt's daughter, [name], called asking to speak with RMR nurse. The patient had procedure on 2/15 and is having vomiting. I told daughter that the nurse was with patients and since the patient is a [facility] that the nurse there needs to contact patient's PCP or RMR.

## 2016-06-24 NOTE — ED Notes (Signed)
Pt's daughter, [name], came to ED to get pt.  States she does not want her sitting here with the flu being rampant.  Inquired about why [facility] did not do mobile xray.  Informed I could not answer this, but Dr. Buford Dresser recommended possible ED eval in his note.  States she is returning her mom to the [facility] center can can be evaluated later, as she is not vomiting at this time or complaining of anything.  Thanked me for speaking with her and states she will ensure follow up with her mom's care.

## 2016-06-24 NOTE — Telephone Encounter (Signed)
I spoke with Altha Harm at the [facility] and made her aware of Dr. Roseanne Kaufman recommendations

## 2016-06-24 NOTE — ED Triage Notes (Addendum)
Sent from [facility] with order for [place] from [facility] has left and family is not here with pt- Pt has dementia and cannot participate in her assessment Per paperwork, pt is a full code Per daughter, pt was seen here yesterday for barium swallow and KUB ultrasound- she reports pt has a hx of constipation Per daughter, they are so busy at [facility], that her BMs are not accurately recorded

## 2016-06-25 ENCOUNTER — Non-Acute Institutional Stay (SKILLED_NURSING_FACILITY): Payer: Medicare Other | Admitting: Internal Medicine

## 2016-06-25 DIAGNOSIS — K3184 Gastroparesis: Secondary | ICD-10-CM

## 2016-06-25 DIAGNOSIS — K219 Gastro-esophageal reflux disease without esophagitis: Secondary | ICD-10-CM | POA: Diagnosis not present

## 2016-06-25 DIAGNOSIS — R112 Nausea with vomiting, unspecified: Secondary | ICD-10-CM

## 2016-06-26 NOTE — Progress Notes (Signed)
This is an acute visit.  Level care skilled.  Facility is [facility].  Chief complaint-acute visit follow-up vomiting.  History of present illness.  Patient is a 81 year old female seen today for follow-up of vomiting.  Apparently the vomiting is more prominent in the morning.  She actually has been evaluated by Thane Edu does have multiple GI issues including gallstones gastroparesis constipation and reflux.  Her oral intake continues to be quite poor but she does take fluids.  She has been on various PPIs with her history of reflux and at one point had been on Reglan but appear to be having symptoms of tardive dyskinesia and this was discontinued. She is currently on Prilosec twice a day as well as Zofran 4 times a day  Dr. [name] gastroenterologist did recently see her and did order workup that has revealed esophageal dysmotility with a small to moderate size hiatal hernia-also showed gallstones with mild gallbladder wall thickening no obvious obstruction.  Subsequently an x-ray was ordered to rule out constipation and apparently patient did go to ER yesterday but apparently ER was very busy and so cu patient's daughter did bring her back to the facility and we have done a mobile x-ray which did not really show constipation it did show multiple diverticula  of the descending and sigmoid colon  Currently patient appears to be resting comfortably she does not appear to be feeling all that well but does not appear to be in any distress unclear whether she had vomiting this morning but is not having any apparently later in the day.  Her daughterLynn who is also the dietitian in the facility was wondering about possibly starting a relatively new medication Domperidone --which has been used for motility disorders.  I did discuss this with her via phone and would be more comfortable having Dr. [name] to assess possibly using this and her daughter was fine with that   Past Medical  History:  Diagnosis Date  . Cancer ([place])   . Dementia   . Gallstones   . Gastroparalysis   . HTN (hypertension)   . Infiltrating ductal carcinoma of right female breast ([place]) 12/02/2009   Qualifier: History of  By: [name] CMA [name]), June    . Osteoporosis 11/18/2011  . Reflux   . Renal disorder    renal insufficiency.         Past Surgical History:  Procedure Laterality Date  . BREAST BIOPSY    . BREAST LUMPECTOMY    . COLONOSCOPY  2009   Dr. Fuller Plan: normal  . ESOPHAGOGASTRODUODENOSCOPY  12/11/2009   Dr. Fuller Plan: hiatal hernia  . ESOPHAGOGASTRODUODENOSCOPY N/A 01/29/2014   Procedure: ESOPHAGOGASTRODUODENOSCOPY (EGD);  Surgeon: [name], MD;  [facility];  Service: Endoscopy;  Laterality: N/A;  245  . TONSILLECTOMY AND ADENOIDECTOMY      reports that she has never smoked. She has never used smokeless tobacco. She reports that she does not drink alcohol or use drugs. Social History        Social History  . Marital status: Divorced    Spouse name: N/A  . Number of children: 2  . Years of education: college       Occupational History  . retired Retired   Social History Main Topics  . Smoking status: Never Smoker  . Smokeless tobacco: Never Used  . Alcohol use No  . Drug use: No  . Sexual activity: Not on file       Other Topics Concern  .  Not on file      Social History Narrative  . No narrative on file    Functional Status Survey:        Family History  Problem Relation Age of Onset  . Cancer Sister     breast  . Cancer    . Colon cancer Neg Hx         Health Maintenance  Topic Date Due  . ZOSTAVAX  10/05/2016 (Originally 01/12/1998)  . PNA vac Low Risk Adult (1 of 2 - PCV13) 10/05/2016 (Originally 01/13/2003)  . TETANUS/TDAP  10/05/2025 (Originally 01/12/1955)  . INFLUENZA VACCINE  Completed  . DEXA SCAN  Completed        Allergies  Allergen Reactions  . Aricept [Donepezil Hydrochloride]  Nausea And Vomiting  . Exelon [Rivastigmine Tartrate]   . Lithium Nausea And Vomiting        Allergies as of 06/13/2016      Reactions   Aricept [donepezil Hydrochloride] Nausea And Vomiting   Exelon [rivastigmine Tartrate]    Lithium Nausea And Vomiting               Medication List           Accurate as of 06/13/16 11:23 AM. Always use your most recent med list.           acetaminophen 325 MG tablet Commonly known as:  TYLENOL Take one tablet by mouth at bedtime   amLODipine 5 MG tablet Commonly known as:  NORVASC Take 5 mg by mouth daily.   cloNIDine 0.1 MG tablet Commonly known as:  CATAPRES Take 0.1 mg by mouth daily.   docusate 50 MG/5ML liquid Commonly known as:  COLACE Take 100 mg by mouth daily.   feeding supplement Liqd Take 1 Container by mouth 2 (two) times daily between meals.   feeding supplement (ENSURE ENLIVE) Liqd Take 237 mLs by mouth 3 (three) times daily with meals.   magnesium hydroxide 400 MG/5ML suspension Commonly known as:  MILK OF MAGNESIA Take 30 mLs by mouth daily as needed for mild constipation.   omeprazole 40 MG capsule Commonly known as:  PRILOSEC Take 40 mg by mouth 2 (two) times daily. Until 06/17/16   ondansetron 4 MG disintegrating tablet Commonly known as:  ZOFRAN-ODT Dissolve 1 tablet in mouth 3 times daily before meals and at bedtime for nausea   oseltamivir 6 MG/ML Susr suspension Commonly known as:  TAMIFLU Take 5 mLs (30 mg total) by mouth 2 (two) times daily.   polyethylene glycol packet Commonly known as:  MIRALAX / GLYCOLAX Take 17 g by mouth daily.   potassium chloride 20 MEQ/15ML (10%) Soln Take 40 mEq by mouth daily.   ZINC OXIDE (TOPICAL) 10 % Crea Apply 1 application topically 3 (three) times daily. Apply sparingly to buttocks d/t redness       Review of Systems  Unable to perform ROS: Dementia    Vitals:   Temperature is 97.8 pulse 80 respirations 18 blood  pressure 133/77 Physical Exam  Constitutional: She appears frail. Lying in bed there is no sign of distress  Her skin is warm and dry HENT:  Head: Normocephalic.  Mouth/Throat: Oropharynx is clear and moist.  Eyes: Pupils are equal, round, and reactive to light.  Neck: Neck supple.  Cardiovascular: Normal rate, regular rhythm and normal heart sounds.   No murmur heard. Pulmonary/Chest: No labored breathing-she has poor respiratory effort does not really follow verbal commands but cannot appreciate any overt congestion .  She has no rales. She exhibits no tenderness.  Abdominal: Soft. Bowel sounds are normal. Abdomen his baseline protuberant could not really appreciate any acute tenderness to palpation she does react to the invasive maneuver but this appears to be a typicall response  Musculoskeletal: She exhibits no edema.  Lymphadenopathy:    She has no cervical adenopathy.  Neurological: She is alert.  Not oriented. Moving all Extremities well.  Skin: Skin is warm and dry. No rash noted. No erythema.  Psychiatric: She is not really agitated with exam although confused--she is alert responsive    Labs reviewed: Basic Metabolic Panel:  Recent Labs (within last 365 days)   Recent Labs  11/27/15 0725  04/11/16 0700  06/02/16 0700  06/10/16 1838 06/11/16 0550 06/12/16 0546  NA 141  < > 139  < > 143  < > 144 149* 143  K 3.6  < > 3.9  < > 3.6  < > 4.1 4.2 3.6  CL 104  < > 103  < > 105  < > 107 114* 108  CO2 28  < > 27  < > 28  < > 24 23 25   GLUCOSE 83  < > 89  < > 101*  < > 135* 133* 104*  BUN 39*  < > 40*  < > 37*  < > 32* 30* 36*  CREATININE 1.86*  < > 1.50*  < > 1.53*  < > 1.96* 1.77* 1.69*  CALCIUM 9.4  < > 9.4  < > 9.6  < > 9.9 9.6 9.1  MG 2.4  --  2.6*  --  2.6*  --   --   --   --   PHOS  --   --   --   --   --   --   --   --  2.3*  < > = values in this interval not displayed.   Liver Function Tests:  Recent Labs (within last 365 days)   Recent Labs   05/25/16 0500 06/10/16 1300 06/10/16 1838 06/12/16 0546  AST 25 41 46*  --   ALT 19 41 51  --   ALKPHOS 70 77 75  --   BILITOT 0.7 0.5 0.4  --   PROT 7.3 8.4* 8.1  --   ALBUMIN 3.9 4.4 4.0 3.4*      Recent Labs (within last 365 days)   Recent Labs  05/25/16 0500  LIPASE 31  AMYLASE 133*     Recent Labs (within last 365 days)  No results for input(s): AMMONIA in the last 8760 hours.   CBC:  Recent Labs (within last 365 days)   Recent Labs  05/24/16 0500 06/10/16 1300 06/10/16 1838 06/11/16 0550  WBC 7.5 9.3 9.3 8.5  NEUTROABS 5.8 8.3* 7.3  --   HGB 13.1 15.6* 14.2 14.3  HCT 41.6 49.8* 44.1 45.1  MCV 93.1 94.3 93.0 94.0  PLT 223 262 256 274     Cardiac Enzymes: Recent Labs (within last 365 days)  No results for input(s): CKTOTAL, CKMB, CKMBINDEX, TROPONINI in the last 8760 hours.   BNP: Recent Labs (within last 365 days)     Assessment and plan.  #1 history of vomiting with extensive GI history as noted above including gastroparesis and reflux-she continues on Prilosec twice a day-I did discuss with her daughter possible new medication Domperidone that has been used for motility disorders-will refer to gastroenterologist for follow-up of this will have nursing staff contact Dr. Roseanne Kaufman  office Monday morning for follow-up.  Again she has had a fairly extensive GI workup which has revealed a hiatal hernia as well as gallstones although gallstones appear to be relatively asymptomatic no obviously obstruction-x-ray did not really show any constipation.  Will continue to monitor clinically at this point her daughter does not really want any further labs drawn.  Continue supportive care and try to encourage oral intake Also continue Prilosec twice a day as well as the Zofran 4 times a day   [phone]

## 2016-06-27 ENCOUNTER — Non-Acute Institutional Stay (SKILLED_NURSING_FACILITY): Payer: Medicare Other | Admitting: Internal Medicine

## 2016-06-27 ENCOUNTER — Encounter: Payer: Self-pay | Admitting: Internal Medicine

## 2016-06-27 ENCOUNTER — Telehealth: Payer: Self-pay

## 2016-06-27 DIAGNOSIS — G309 Alzheimer's disease, unspecified: Secondary | ICD-10-CM

## 2016-06-27 DIAGNOSIS — F0281 Dementia in other diseases classified elsewhere with behavioral disturbance: Secondary | ICD-10-CM

## 2016-06-27 DIAGNOSIS — K3184 Gastroparesis: Secondary | ICD-10-CM

## 2016-06-27 DIAGNOSIS — I10 Essential (primary) hypertension: Secondary | ICD-10-CM | POA: Diagnosis not present

## 2016-06-27 DIAGNOSIS — K219 Gastro-esophageal reflux disease without esophagitis: Secondary | ICD-10-CM

## 2016-06-27 DIAGNOSIS — G308 Other Alzheimer's disease: Secondary | ICD-10-CM

## 2016-06-27 NOTE — Telephone Encounter (Signed)
Possible re-admission to facility. This is a patient you were seeing at [facility] . [facility] F/U is needed if patient was re-admitted to facility upon discharge. Hospital discharge from [facility] on 06/24/16.

## 2016-06-27 NOTE — Progress Notes (Signed)
Location:   [facility] Room Number: 110/W Place of Service:  SNF [phone]) Provider:  Clydene Fake, MD  Patient Care Team: [name], MD as PCP - General (Internal Medicine) [name], MD as Consulting Physician (Gastroenterology)  Extended Emergency Contact Information Primary Emergency Contact: [name] Address: [address]          [place], [place] [address] [name] of [place] Phone: [phone] Work Phone: [phone] Mobile Phone: [phone] Relation: Daughter Secondary Emergency Contact: [name] Address: [address]          [place], [place] [address] [name] of [place] Phone: [phone] Mobile Phone: [phone] Relation: Relative  Code Status:  DNR Goals of care: Advanced Directive information Advanced Directives 06/27/2016  Does Patient Have a Medical Advance Directive? Yes  Type of Advance Directive Out of facility DNR (pink MOST or yellow form)  Does patient want to make changes to medical advance directive? No - Patient declined  Copy of [place] in Chart? -  Would patient like information on creating a medical advance directive? No - Patient declined  Pre-existing out of facility DNR order (yellow form or pink MOST form) -     Chief Complaint  Patient presents with  . Acute Visit    Need Prilosec back Continue Nausea and vomiting.    HPI:  Pt is a 81 y.o. female seen today for an acute visit for Continued Nausea and Vomiting. Patient has h/o hypertension, Advanced Dementia, Chronic renal disease, Breast cancer s/p Mastectomy. Patient has been having continuous Nausea and vomiting for past few months. Patient has been on Liquid diet for now almost 1 year. But recently she has not even drinking and vomiting liquid also.  She was started on Prilosec for GERD, Also on Zofran before each meal.  She was also seen by her gastroenterologist for this. He had [place] of RUQ  which showed Gall stones with thickening but no signs of cholecystitis.  Barium swallow showed Esophageal dysmotility. KUB was also done which did not show any stool burden. Her daughter wanted me to see if we should consider Hospice for patient. As she feels her mother feels weak and miserable. Any intervention would not help her. Today patient had one episode of vomiting but d/w nurses she did not vomit after lunch as she got Zofran. She is unable to give me any history due to her dementia.  Past Medical History:  Diagnosis Date  . Cancer ([place])   . Dementia   . Gallstones   . Gastroparalysis   . HTN (hypertension)   . Infiltrating ductal carcinoma of right female breast ([place]) 12/02/2009   Qualifier: History of  By: [name] CMA [name]), June    . Osteoporosis 11/18/2011  . Reflux   . Renal disorder    renal insufficiency.    Past Surgical History:  Procedure Laterality Date  . BREAST BIOPSY    . BREAST LUMPECTOMY    . COLONOSCOPY  2009   Dr. Fuller Plan: normal  . ESOPHAGOGASTRODUODENOSCOPY  12/11/2009   Dr. Fuller Plan: hiatal hernia  . ESOPHAGOGASTRODUODENOSCOPY N/A 01/29/2014   Procedure: ESOPHAGOGASTRODUODENOSCOPY (EGD);  Surgeon: [name], MD;  [facility];  Service: Endoscopy;  Laterality: N/A;  245  . TONSILLECTOMY AND ADENOIDECTOMY      Allergies  Allergen Reactions  . Aricept [Donepezil Hydrochloride] Nausea And Vomiting  . Exelon [Rivastigmine Tartrate]   . Lithium Nausea And Vomiting  Allergies as of 06/27/2016      Reactions   Aricept [donepezil Hydrochloride] Nausea And Vomiting   Exelon [rivastigmine Tartrate]    Lithium Nausea And Vomiting      Medication List       Accurate as of 06/27/16 10:35 AM. Always use your most recent med list.          acetaminophen 325 MG tablet Commonly known as:  TYLENOL Take one tablet by mouth at bedtime, take 650 mg by mouth every 4 hours prn   amLODipine 5 MG tablet Commonly known as:  NORVASC Take 5 mg  by mouth daily.   docusate 50 MG/5ML liquid Commonly known as:  COLACE Take 100 mg by mouth daily.   magnesium hydroxide 400 MG/5ML suspension Commonly known as:  MILK OF MAGNESIA Take 30 mLs by mouth daily as needed for mild constipation.   ondansetron 4 MG disintegrating tablet Commonly known as:  ZOFRAN-ODT Dissolve 1 tablet in mouth 3 times daily before meals and at bedtime for nausea   polyethylene glycol packet Commonly known as:  MIRALAX / GLYCOLAX Take 17 g by mouth daily.   potassium chloride 20 MEQ/15ML (10%) Soln Take 20 mEq by mouth daily.   ZINC OXIDE (TOPICAL) 10 % Crea Apply 1 application topically 3 (three) times daily. Apply sparingly to buttocks d/t redness       Review of Systems  Unable to perform ROS: Dementia    Immunization History  Administered Date(s) Administered  . Influenza-Unspecified 02/13/2014, 02/09/2016   Pertinent  Health Maintenance Due  Topic Date Due  . PNA vac Low Risk Adult (1 of 2 - PCV13) 10/05/2016 (Originally 01/13/2003)  . INFLUENZA VACCINE  Completed  . DEXA SCAN  Completed   No flowsheet data found. Functional Status Survey:    Vitals:   06/27/16 1001  BP: (!) 173/89 repeat wsa 170/100  Pulse: 68  Resp: 20  Temp: 99.5 F (37.5 C)  TempSrc: Axillary  SpO2: 97%   There is no height or weight on file to calculate BMI. Physical Exam  Constitutional: She appears well-developed.  HENT:  Head: Normocephalic.  Eyes: Pupils are equal, round, and reactive to light.  Neck: Neck supple.  Cardiovascular: Normal rate, regular rhythm and normal heart sounds.   Pulmonary/Chest: Effort normal and breath sounds normal. No respiratory distress. She has no wheezes. She has no rales.  Abdominal: Soft. Bowel sounds are normal. She exhibits no distension. There is no tenderness. There is no rebound.  Musculoskeletal: She exhibits no edema.  Neurological: She is alert.  Not oriented. Does not follow any commands.  Skin: Skin is  warm and dry. No rash noted. No erythema. No pallor.  Psychiatric: She has a normal mood and affect. Her behavior is normal.    Labs reviewed:  Recent Labs  11/27/15 0725  04/11/16 0700  06/02/16 0700  06/11/16 0550 06/12/16 0546 06/15/16 0718  NA 141  < > 139  < > 143  < > 149* 143 143  K 3.6  < > 3.9  < > 3.6  < > 4.2 3.6 3.9  CL 104  < > 103  < > 105  < > 114* 108 105  CO2 28  < > 27  < > 28  < > 23 25 27   GLUCOSE 83  < > 89  < > 101*  < > 133* 104* 92  BUN 39*  < > 40*  < > 37*  < > 30* 36*  46*  CREATININE 1.86*  < > 1.50*  < > 1.53*  < > 1.77* 1.69* 1.70*  CALCIUM 9.4  < > 9.4  < > 9.6  < > 9.6 9.1 9.3  MG 2.4  --  2.6*  --  2.6*  --   --   --   --   PHOS  --   --   --   --   --   --   --  2.3*  --   < > = values in this interval not displayed.  Recent Labs  05/25/16 0500 06/10/16 1300 06/10/16 1838 06/12/16 0546  AST 25 41 46*  --   ALT 19 41 51  --   ALKPHOS 70 77 75  --   BILITOT 0.7 0.5 0.4  --   PROT 7.3 8.4* 8.1  --   ALBUMIN 3.9 4.4 4.0 3.4*    Recent Labs  05/24/16 0500 06/10/16 1300 06/10/16 1838 06/11/16 0550 06/15/16 0718  WBC 7.5 9.3 9.3 8.5 9.4  NEUTROABS 5.8 8.3* 7.3  --   --   HGB 13.1 15.6* 14.2 14.3 13.1  HCT 41.6 49.8* 44.1 45.1 41.1  MCV 93.1 94.3 93.0 94.0 91.9  PLT 223 262 256 274 231   Lab Results  Component Value Date   TSH 0.749 06/10/2016   Lab Results  Component Value Date   HGBA1C  05/17/2007    5.4 (NOTE)   The ADA recommends the following therapeutic goals for glycemic   control related to Hgb A1C measurement:   Goal of Therapy:   < 7.0% Hgb A1C   Action Suggested:  > 8.0% Hgb A1C   Ref:  Diabetes Care, 22, Suppl. 1, 1999   Lab Results  Component Value Date   CHOL  05/22/2007    175        ATP III CLASSIFICATION:  <200     mg/dL   Desirable  200-239  mg/dL   Borderline High  >=240    mg/dL   High   HDL 37 (L) 05/22/2007   LDLCALC  05/22/2007    96        Total Cholesterol/HDL:CHD Risk Coronary Heart Disease  Risk Table                     Men   Women  1/2 Average Risk   3.4   3.3   TRIG 208 (H) 05/22/2007   CHOLHDL 4.7 05/22/2007    Significant Diagnostic Results in last 30 days:  Dg Esophagus  Result Date: 06/23/2016 CLINICAL DATA:  Dysphagia, dementia EXAM: ESOPHOGRAM/BARIUM SWALLOW TECHNIQUE: Single contrast examination was performed using  thin barium. FLUOROSCOPY TIME:  Fluoroscopy Time:  1 minutes 42 seconds Radiation Exposure Index (if provided by the fluoroscopic device): 22.3 Number of Acquired Spot Images: Multiple screen captures during fluoroscopy COMPARISON:  None FINDINGS: Limited exam due to patient condition/dementia. Diffuse age-related esophageal dysmotility. Incomplete clearance of barium by primary peristaltic waves with secondary and tertiary waves identified. Small to moderate-sized hiatal hernia. Smooth appearance of esophageal walls without gross evidence of irregularity or ulceration. No persistent intraluminal filling defects, mass or stricture are evident. Patient was unable to swallow a 12.5 mm diameter barium tablet due to her clinical condition. IMPRESSION: Esophageal dysmotility. Small moderate-sized hiatal hernia. No other definite abnormalities identified. Electronically Signed   By: Lavonia Dana M.D.   On: 06/23/2016 12:49   Dg Chest Port 1 View  Result Date: 06/10/2016 CLINICAL DATA:  Fever.  Decreased oral intake. EXAM: PORTABLE CHEST 1 VIEW COMPARISON:  01/09/2014 FINDINGS: Normal heart size for technique. Aortic tortuosity accentuated by rightward rotation. There is no edema, consolidation, effusion, or pneumothorax. IMPRESSION: No acute finding. Electronically Signed   By: Monte Fantasia M.D.   On: 06/10/2016 19:23   US Abdomen Limited Ruq  Result Date: 06/23/2016 CLINICAL DATA:  Dysphagia, weight loss, dementia, known gallstones EXAM: US ABDOMEN LIMITED - RIGHT UPPER QUADRANT COMPARISON:  Abdominal ultrasound of March 27, 2013 FINDINGS: Gallbladder: There are  gallstones present with the largest measuring 2.3 cm in greatest dimension. There is mild gallbladder wall thickening to 4.5 mm. There is no positive sonographic Murphy's sign. Common bile duct: Diameter: 4.1 mm Liver: The hepatic echotexture is normal. There is no focal mass nor ductal dilation. IMPRESSION: Gallstones without sonographic evidence of acute cholecystitis. Normal appearance of the common bile duct and liver. Electronically Signed   By: David  Martinique M.D.   On: 06/23/2016 09:57    Assessment/Plan  Continuous Nausea and vomiting. D/W the daughter who is concerned that her Mother is getting dehydrated and is weak and she does not want her to go through any more testing if its not going to help her mom. At this time Will start her on Reglan 5 mg BID to see if it improves her symptoms. Restart her on Prilosec Which was discontinued during her visit to ED Continue Zofran before Meal. Would not do any blood work right now. Patients daughter does not think Surgery for her gall bladder will help u but will d/w  Dr [name]. Consider Hospice if symptoms not improve.  Hypertension Patient did get Norvasc this Morning but not sure if she was able to keep it down due to her vomiting. Will give her Clonidine 0.1 mg and repeat BP.   Family/ staff Communication:   Labs/tests ordered:

## 2016-06-28 ENCOUNTER — Other Ambulatory Visit (HOSPITAL_COMMUNITY)
Admission: RE | Admit: 2016-06-28 | Discharge: 2016-06-28 | Disposition: A | Discharge status: Home / Self Care | Payer: Medicare Other | Source: Skilled Nursing Facility | Attending: Internal Medicine | Admitting: Internal Medicine

## 2016-06-28 ENCOUNTER — Telehealth: Payer: Self-pay

## 2016-06-28 DIAGNOSIS — F0391 Unspecified dementia with behavioral disturbance: Secondary | ICD-10-CM | POA: Diagnosis not present

## 2016-06-28 DIAGNOSIS — R112 Nausea with vomiting, unspecified: Secondary | ICD-10-CM | POA: Diagnosis not present

## 2016-06-28 DIAGNOSIS — K3184 Gastroparesis: Secondary | ICD-10-CM | POA: Diagnosis not present

## 2016-06-28 DIAGNOSIS — N390 Urinary tract infection, site not specified: Secondary | ICD-10-CM | POA: Insufficient documentation

## 2016-06-28 DIAGNOSIS — I10 Essential (primary) hypertension: Secondary | ICD-10-CM | POA: Diagnosis not present

## 2016-06-28 DIAGNOSIS — Z9181 History of falling: Secondary | ICD-10-CM | POA: Diagnosis not present

## 2016-06-28 DIAGNOSIS — J1000 Influenza due to other identified influenza virus with unspecified type of pneumonia: Secondary | ICD-10-CM | POA: Diagnosis not present

## 2016-06-28 DIAGNOSIS — Z853 Personal history of malignant neoplasm of breast: Secondary | ICD-10-CM | POA: Diagnosis not present

## 2016-06-28 LAB — URINALYSIS, COMPLETE (UACMP) WITH MICROSCOPIC
Bilirubin Urine: NEGATIVE
GLUCOSE, UA: NEGATIVE mg/dL
KETONES UR: NEGATIVE mg/dL
Nitrite: NEGATIVE
SQUAMOUS EPITHELIAL / LPF: NONE SEEN
Specific Gravity, Urine: 1.025 (ref 1.005–1.030)
pH: 7.5 (ref 5.0–8.0)

## 2016-06-28 LAB — INFLUENZA PANEL BY PCR (TYPE A & B)
INFLAPCR: NEGATIVE
INFLBPCR: NEGATIVE

## 2016-06-28 NOTE — Telephone Encounter (Signed)
Received call from nurse- Suanne Marker, at [facility] has seen the pt and wants to start her on reglan.  Please see his note in epic. They are wanting to know if you agree?  They also faxed a copy of her KUB. It is on your cart.

## 2016-06-28 NOTE — Telephone Encounter (Signed)
I'll go along with a trial of Reglan. Would start low i.e. 2.5 mg before meals and at bedtime. If it looks like this is helping,  could increase by 2.5 mg increments.  Could worsen pre-existing neurological status.  Prescribing physician should have informed consent discussion with patient's daughter.  If no significant improvement, would obtain surgical consultation for cholecystectomy

## 2016-06-29 DIAGNOSIS — Z853 Personal history of malignant neoplasm of breast: Secondary | ICD-10-CM | POA: Diagnosis not present

## 2016-06-29 DIAGNOSIS — K3184 Gastroparesis: Secondary | ICD-10-CM | POA: Diagnosis not present

## 2016-06-29 DIAGNOSIS — Z9181 History of falling: Secondary | ICD-10-CM | POA: Diagnosis not present

## 2016-06-29 DIAGNOSIS — I10 Essential (primary) hypertension: Secondary | ICD-10-CM | POA: Diagnosis not present

## 2016-06-29 DIAGNOSIS — R112 Nausea with vomiting, unspecified: Secondary | ICD-10-CM | POA: Diagnosis not present

## 2016-06-29 DIAGNOSIS — F0391 Unspecified dementia with behavioral disturbance: Secondary | ICD-10-CM | POA: Diagnosis not present

## 2016-06-29 NOTE — Telephone Encounter (Signed)
Tried to call [facility], put on hold, was not able to speak with someone.

## 2016-06-30 DIAGNOSIS — K3184 Gastroparesis: Secondary | ICD-10-CM | POA: Diagnosis not present

## 2016-06-30 DIAGNOSIS — Z9181 History of falling: Secondary | ICD-10-CM | POA: Diagnosis not present

## 2016-06-30 DIAGNOSIS — F0391 Unspecified dementia with behavioral disturbance: Secondary | ICD-10-CM | POA: Diagnosis not present

## 2016-06-30 DIAGNOSIS — I10 Essential (primary) hypertension: Secondary | ICD-10-CM | POA: Diagnosis not present

## 2016-06-30 DIAGNOSIS — R112 Nausea with vomiting, unspecified: Secondary | ICD-10-CM | POA: Diagnosis not present

## 2016-06-30 DIAGNOSIS — Z853 Personal history of malignant neoplasm of breast: Secondary | ICD-10-CM | POA: Diagnosis not present

## 2016-06-30 NOTE — Telephone Encounter (Signed)
I have printed this note and faxed it to [facility]. I am also forwarding to Dr.Gupta.

## 2016-07-01 DIAGNOSIS — Z9181 History of falling: Secondary | ICD-10-CM | POA: Diagnosis not present

## 2016-07-01 DIAGNOSIS — R112 Nausea with vomiting, unspecified: Secondary | ICD-10-CM | POA: Diagnosis not present

## 2016-07-01 DIAGNOSIS — I10 Essential (primary) hypertension: Secondary | ICD-10-CM | POA: Diagnosis not present

## 2016-07-01 DIAGNOSIS — Z853 Personal history of malignant neoplasm of breast: Secondary | ICD-10-CM | POA: Diagnosis not present

## 2016-07-01 DIAGNOSIS — F0391 Unspecified dementia with behavioral disturbance: Secondary | ICD-10-CM | POA: Diagnosis not present

## 2016-07-01 DIAGNOSIS — K3184 Gastroparesis: Secondary | ICD-10-CM | POA: Diagnosis not present

## 2016-07-01 LAB — URINE CULTURE

## 2016-07-04 DIAGNOSIS — R112 Nausea with vomiting, unspecified: Secondary | ICD-10-CM | POA: Diagnosis not present

## 2016-07-04 DIAGNOSIS — Z853 Personal history of malignant neoplasm of breast: Secondary | ICD-10-CM | POA: Diagnosis not present

## 2016-07-04 DIAGNOSIS — I10 Essential (primary) hypertension: Secondary | ICD-10-CM | POA: Diagnosis not present

## 2016-07-04 DIAGNOSIS — F0391 Unspecified dementia with behavioral disturbance: Secondary | ICD-10-CM | POA: Diagnosis not present

## 2016-07-04 DIAGNOSIS — K3184 Gastroparesis: Secondary | ICD-10-CM | POA: Diagnosis not present

## 2016-07-04 DIAGNOSIS — Z9181 History of falling: Secondary | ICD-10-CM | POA: Diagnosis not present

## 2016-07-05 DIAGNOSIS — F0391 Unspecified dementia with behavioral disturbance: Secondary | ICD-10-CM | POA: Diagnosis not present

## 2016-07-05 DIAGNOSIS — Z9181 History of falling: Secondary | ICD-10-CM | POA: Diagnosis not present

## 2016-07-05 DIAGNOSIS — R112 Nausea with vomiting, unspecified: Secondary | ICD-10-CM | POA: Diagnosis not present

## 2016-07-05 DIAGNOSIS — Z853 Personal history of malignant neoplasm of breast: Secondary | ICD-10-CM | POA: Diagnosis not present

## 2016-07-05 DIAGNOSIS — I10 Essential (primary) hypertension: Secondary | ICD-10-CM | POA: Diagnosis not present

## 2016-07-05 DIAGNOSIS — K3184 Gastroparesis: Secondary | ICD-10-CM | POA: Diagnosis not present

## 2016-07-06 DIAGNOSIS — I10 Essential (primary) hypertension: Secondary | ICD-10-CM | POA: Diagnosis not present

## 2016-07-06 DIAGNOSIS — R112 Nausea with vomiting, unspecified: Secondary | ICD-10-CM | POA: Diagnosis not present

## 2016-07-06 DIAGNOSIS — F0391 Unspecified dementia with behavioral disturbance: Secondary | ICD-10-CM | POA: Diagnosis not present

## 2016-07-06 DIAGNOSIS — K3184 Gastroparesis: Secondary | ICD-10-CM | POA: Diagnosis not present

## 2016-07-06 DIAGNOSIS — Z853 Personal history of malignant neoplasm of breast: Secondary | ICD-10-CM | POA: Diagnosis not present

## 2016-07-06 DIAGNOSIS — Z9181 History of falling: Secondary | ICD-10-CM | POA: Diagnosis not present

## 2016-07-07 DIAGNOSIS — R1311 Dysphagia, oral phase: Secondary | ICD-10-CM | POA: Diagnosis not present

## 2016-07-07 DIAGNOSIS — Z9181 History of falling: Secondary | ICD-10-CM | POA: Diagnosis not present

## 2016-07-07 DIAGNOSIS — I10 Essential (primary) hypertension: Secondary | ICD-10-CM | POA: Diagnosis not present

## 2016-07-07 DIAGNOSIS — F0391 Unspecified dementia with behavioral disturbance: Secondary | ICD-10-CM | POA: Diagnosis not present

## 2016-07-07 DIAGNOSIS — Z853 Personal history of malignant neoplasm of breast: Secondary | ICD-10-CM | POA: Diagnosis not present

## 2016-07-07 DIAGNOSIS — R112 Nausea with vomiting, unspecified: Secondary | ICD-10-CM | POA: Diagnosis not present

## 2016-07-07 DIAGNOSIS — K3184 Gastroparesis: Secondary | ICD-10-CM | POA: Diagnosis not present

## 2016-07-08 DIAGNOSIS — R112 Nausea with vomiting, unspecified: Secondary | ICD-10-CM | POA: Diagnosis not present

## 2016-07-08 DIAGNOSIS — K3184 Gastroparesis: Secondary | ICD-10-CM | POA: Diagnosis not present

## 2016-07-08 DIAGNOSIS — I10 Essential (primary) hypertension: Secondary | ICD-10-CM | POA: Diagnosis not present

## 2016-07-08 DIAGNOSIS — F0391 Unspecified dementia with behavioral disturbance: Secondary | ICD-10-CM | POA: Diagnosis not present

## 2016-07-08 DIAGNOSIS — R1311 Dysphagia, oral phase: Secondary | ICD-10-CM | POA: Diagnosis not present

## 2016-07-08 DIAGNOSIS — Z853 Personal history of malignant neoplasm of breast: Secondary | ICD-10-CM | POA: Diagnosis not present

## 2016-07-11 DIAGNOSIS — K3184 Gastroparesis: Secondary | ICD-10-CM | POA: Diagnosis not present

## 2016-07-11 DIAGNOSIS — R1311 Dysphagia, oral phase: Secondary | ICD-10-CM | POA: Diagnosis not present

## 2016-07-11 DIAGNOSIS — I10 Essential (primary) hypertension: Secondary | ICD-10-CM | POA: Diagnosis not present

## 2016-07-11 DIAGNOSIS — F0391 Unspecified dementia with behavioral disturbance: Secondary | ICD-10-CM | POA: Diagnosis not present

## 2016-07-11 DIAGNOSIS — Z853 Personal history of malignant neoplasm of breast: Secondary | ICD-10-CM | POA: Diagnosis not present

## 2016-07-11 DIAGNOSIS — R112 Nausea with vomiting, unspecified: Secondary | ICD-10-CM | POA: Diagnosis not present

## 2016-07-12 DIAGNOSIS — F0391 Unspecified dementia with behavioral disturbance: Secondary | ICD-10-CM | POA: Diagnosis not present

## 2016-07-12 DIAGNOSIS — Z853 Personal history of malignant neoplasm of breast: Secondary | ICD-10-CM | POA: Diagnosis not present

## 2016-07-12 DIAGNOSIS — R1311 Dysphagia, oral phase: Secondary | ICD-10-CM | POA: Diagnosis not present

## 2016-07-12 DIAGNOSIS — K3184 Gastroparesis: Secondary | ICD-10-CM | POA: Diagnosis not present

## 2016-07-12 DIAGNOSIS — R112 Nausea with vomiting, unspecified: Secondary | ICD-10-CM | POA: Diagnosis not present

## 2016-07-12 DIAGNOSIS — I10 Essential (primary) hypertension: Secondary | ICD-10-CM | POA: Diagnosis not present

## 2016-07-13 DIAGNOSIS — Z853 Personal history of malignant neoplasm of breast: Secondary | ICD-10-CM | POA: Diagnosis not present

## 2016-07-13 DIAGNOSIS — I10 Essential (primary) hypertension: Secondary | ICD-10-CM | POA: Diagnosis not present

## 2016-07-13 DIAGNOSIS — F0391 Unspecified dementia with behavioral disturbance: Secondary | ICD-10-CM | POA: Diagnosis not present

## 2016-07-13 DIAGNOSIS — R1311 Dysphagia, oral phase: Secondary | ICD-10-CM | POA: Diagnosis not present

## 2016-07-13 DIAGNOSIS — R112 Nausea with vomiting, unspecified: Secondary | ICD-10-CM | POA: Diagnosis not present

## 2016-07-13 DIAGNOSIS — K3184 Gastroparesis: Secondary | ICD-10-CM | POA: Diagnosis not present

## 2016-07-14 DIAGNOSIS — F0391 Unspecified dementia with behavioral disturbance: Secondary | ICD-10-CM | POA: Diagnosis not present

## 2016-07-14 DIAGNOSIS — Z853 Personal history of malignant neoplasm of breast: Secondary | ICD-10-CM | POA: Diagnosis not present

## 2016-07-14 DIAGNOSIS — R112 Nausea with vomiting, unspecified: Secondary | ICD-10-CM | POA: Diagnosis not present

## 2016-07-14 DIAGNOSIS — R1311 Dysphagia, oral phase: Secondary | ICD-10-CM | POA: Diagnosis not present

## 2016-07-14 DIAGNOSIS — I10 Essential (primary) hypertension: Secondary | ICD-10-CM | POA: Diagnosis not present

## 2016-07-14 DIAGNOSIS — K3184 Gastroparesis: Secondary | ICD-10-CM | POA: Diagnosis not present

## 2016-07-19 DIAGNOSIS — R112 Nausea with vomiting, unspecified: Secondary | ICD-10-CM | POA: Diagnosis not present

## 2016-07-19 DIAGNOSIS — F0391 Unspecified dementia with behavioral disturbance: Secondary | ICD-10-CM | POA: Diagnosis not present

## 2016-07-19 DIAGNOSIS — K3184 Gastroparesis: Secondary | ICD-10-CM | POA: Diagnosis not present

## 2016-07-19 DIAGNOSIS — R1311 Dysphagia, oral phase: Secondary | ICD-10-CM | POA: Diagnosis not present

## 2016-07-19 DIAGNOSIS — I10 Essential (primary) hypertension: Secondary | ICD-10-CM | POA: Diagnosis not present

## 2016-07-19 DIAGNOSIS — Z853 Personal history of malignant neoplasm of breast: Secondary | ICD-10-CM | POA: Diagnosis not present

## 2016-07-21 DIAGNOSIS — I10 Essential (primary) hypertension: Secondary | ICD-10-CM | POA: Diagnosis not present

## 2016-07-21 DIAGNOSIS — K3184 Gastroparesis: Secondary | ICD-10-CM | POA: Diagnosis not present

## 2016-07-21 DIAGNOSIS — Z853 Personal history of malignant neoplasm of breast: Secondary | ICD-10-CM | POA: Diagnosis not present

## 2016-07-21 DIAGNOSIS — R112 Nausea with vomiting, unspecified: Secondary | ICD-10-CM | POA: Diagnosis not present

## 2016-07-21 DIAGNOSIS — R1311 Dysphagia, oral phase: Secondary | ICD-10-CM | POA: Diagnosis not present

## 2016-07-21 DIAGNOSIS — F0391 Unspecified dementia with behavioral disturbance: Secondary | ICD-10-CM | POA: Diagnosis not present

## 2016-07-22 DIAGNOSIS — I10 Essential (primary) hypertension: Secondary | ICD-10-CM | POA: Diagnosis not present

## 2016-07-22 DIAGNOSIS — R112 Nausea with vomiting, unspecified: Secondary | ICD-10-CM | POA: Diagnosis not present

## 2016-07-22 DIAGNOSIS — F0391 Unspecified dementia with behavioral disturbance: Secondary | ICD-10-CM | POA: Diagnosis not present

## 2016-07-22 DIAGNOSIS — Z853 Personal history of malignant neoplasm of breast: Secondary | ICD-10-CM | POA: Diagnosis not present

## 2016-07-22 DIAGNOSIS — K3184 Gastroparesis: Secondary | ICD-10-CM | POA: Diagnosis not present

## 2016-07-22 DIAGNOSIS — R1311 Dysphagia, oral phase: Secondary | ICD-10-CM | POA: Diagnosis not present

## 2016-07-26 DIAGNOSIS — R112 Nausea with vomiting, unspecified: Secondary | ICD-10-CM | POA: Diagnosis not present

## 2016-07-26 DIAGNOSIS — K3184 Gastroparesis: Secondary | ICD-10-CM | POA: Diagnosis not present

## 2016-07-26 DIAGNOSIS — I10 Essential (primary) hypertension: Secondary | ICD-10-CM | POA: Diagnosis not present

## 2016-07-26 DIAGNOSIS — R1311 Dysphagia, oral phase: Secondary | ICD-10-CM | POA: Diagnosis not present

## 2016-07-26 DIAGNOSIS — F0391 Unspecified dementia with behavioral disturbance: Secondary | ICD-10-CM | POA: Diagnosis not present

## 2016-07-26 DIAGNOSIS — Z853 Personal history of malignant neoplasm of breast: Secondary | ICD-10-CM | POA: Diagnosis not present

## 2016-07-27 DIAGNOSIS — Z853 Personal history of malignant neoplasm of breast: Secondary | ICD-10-CM | POA: Diagnosis not present

## 2016-07-27 DIAGNOSIS — I10 Essential (primary) hypertension: Secondary | ICD-10-CM | POA: Diagnosis not present

## 2016-07-27 DIAGNOSIS — R1311 Dysphagia, oral phase: Secondary | ICD-10-CM | POA: Diagnosis not present

## 2016-07-27 DIAGNOSIS — K3184 Gastroparesis: Secondary | ICD-10-CM | POA: Diagnosis not present

## 2016-07-27 DIAGNOSIS — R112 Nausea with vomiting, unspecified: Secondary | ICD-10-CM | POA: Diagnosis not present

## 2016-07-27 DIAGNOSIS — F0391 Unspecified dementia with behavioral disturbance: Secondary | ICD-10-CM | POA: Diagnosis not present

## 2016-07-29 DIAGNOSIS — K3184 Gastroparesis: Secondary | ICD-10-CM | POA: Diagnosis not present

## 2016-07-29 DIAGNOSIS — F0391 Unspecified dementia with behavioral disturbance: Secondary | ICD-10-CM | POA: Diagnosis not present

## 2016-07-29 DIAGNOSIS — R112 Nausea with vomiting, unspecified: Secondary | ICD-10-CM | POA: Diagnosis not present

## 2016-07-29 DIAGNOSIS — I10 Essential (primary) hypertension: Secondary | ICD-10-CM | POA: Diagnosis not present

## 2016-07-29 DIAGNOSIS — Z853 Personal history of malignant neoplasm of breast: Secondary | ICD-10-CM | POA: Diagnosis not present

## 2016-07-29 DIAGNOSIS — R1311 Dysphagia, oral phase: Secondary | ICD-10-CM | POA: Diagnosis not present

## 2016-08-04 ENCOUNTER — Encounter: Payer: Self-pay | Admitting: Internal Medicine

## 2016-08-04 ENCOUNTER — Non-Acute Institutional Stay (SKILLED_NURSING_FACILITY): Payer: Medicare Other | Admitting: Internal Medicine

## 2016-08-04 DIAGNOSIS — C50911 Malignant neoplasm of unspecified site of right female breast: Secondary | ICD-10-CM | POA: Diagnosis not present

## 2016-08-04 DIAGNOSIS — I10 Essential (primary) hypertension: Secondary | ICD-10-CM | POA: Diagnosis not present

## 2016-08-04 DIAGNOSIS — G309 Alzheimer's disease, unspecified: Principal | ICD-10-CM

## 2016-08-04 DIAGNOSIS — G308 Other Alzheimer's disease: Secondary | ICD-10-CM | POA: Diagnosis not present

## 2016-08-04 DIAGNOSIS — F0281 Dementia in other diseases classified elsewhere with behavioral disturbance: Secondary | ICD-10-CM | POA: Diagnosis not present

## 2016-08-04 DIAGNOSIS — K3184 Gastroparesis: Secondary | ICD-10-CM | POA: Diagnosis not present

## 2016-08-04 NOTE — Progress Notes (Signed)
Location:   [facility] Room Number: 110/W Place of Service:  SNF [phone]) Provider:  Freddi Starr, MD  Patient Care Team: [name], MD as PCP - General (Internal Medicine) [name], MD as Consulting Physician (Gastroenterology)  Extended Emergency Contact Information Primary Emergency Contact: [name] Address: [address]          [place], [place] [address] [name] of [place] Phone: [phone] Work Phone: [phone] Mobile Phone: [phone] Relation: Daughter Secondary Emergency Contact: [name] Address: [address]          [place], [place] [address] [name] of [place] Phone: [phone] Mobile Phone: [phone] Relation: Relative  Code Status:  DNR Goals of care: Advanced Directive information Advanced Directives 08/04/2016  Does Patient Have a Medical Advance Directive? Yes  Type of Advance Directive Out of facility DNR (pink MOST or yellow form)  Does patient want to make changes to medical advance directive? No - Patient declined  Copy of [place] in Chart? -  Would patient like information on creating a medical advance directive? No - Patient declined  Pre-existing out of facility DNR order (yellow form or pink MOST form) -     Chief Complaint  Patient presents with  . Medical Management of Chronic Issues    Routine Visit  For medical management of chronic medical issues including dementia gastroparesis hypertension constipation   HPI:  Pt is a 81 y.o. female seen today for medical management of chronic disease She has a significant history of severe dementia and actually does have a MOST form.  He did undergo. Have fairly consistent nausea and vomiting. She was started on Prilosec for GERD, Also on Zofran before each meal.  She was also seen by her gastroenterologist for this. He had [place] of RUQ which showed Gall stones with thickening but no signs of  cholecystitis. Barium swallow showed Esophageal dysmotility. KUB was also done which did not show any stool burden  She she has been started on Reglan which appears to be helping.  Per GI consult note if there is no improvement there was thought for surgical consult for cholecystectomy although her daughter does not really want aggressive interventions .  She has had some vomiting the last couple mornings-there is some thought this was related to constipation she had a large bowel movement today and apparently has not had any further vomiting.  Her daughter would like something added for constipation she is already on Colace 100 mg every morning also has MiraLAX we will add senna S at night as needed.  Currently she is resting in bed comfortably vital signs appear to be stable.  She does have a history of hypertension she is on Norvasc 5 mg a day recent blood pressure stable 127/71-122/80.  She also has a history of renal insufficiency creatinine appears relatively baseline most recently of 1.7 this was back in early February her daughter prefers to have minimal lab draws.         Past Medical History:  Diagnosis Date  . Cancer ([place])   . Dementia   . Gallstones   . Gastroparalysis   . HTN (hypertension)   . Infiltrating ductal carcinoma of right female breast ([place]) 12/02/2009   Qualifier: History of  By: [name] CMA [name]), June    . Osteoporosis 11/18/2011  . Reflux   . Renal disorder    renal insufficiency.    Past Surgical History:  Procedure Laterality  Date  . BREAST BIOPSY    . BREAST LUMPECTOMY    . COLONOSCOPY  2009   Dr. Fuller Plan: normal  . ESOPHAGOGASTRODUODENOSCOPY  12/11/2009   Dr. Fuller Plan: hiatal hernia  . ESOPHAGOGASTRODUODENOSCOPY N/A 01/29/2014   Procedure: ESOPHAGOGASTRODUODENOSCOPY (EGD);  Surgeon: [name], MD;  [facility];  Service: Endoscopy;  Laterality: N/A;  245  . TONSILLECTOMY AND ADENOIDECTOMY      Allergies  Allergen Reactions    . Aricept [Donepezil Hydrochloride] Nausea And Vomiting  . Exelon [Rivastigmine Tartrate]   . Lithium Nausea And Vomiting    Allergies as of 08/04/2016      Reactions   Aricept [donepezil Hydrochloride] Nausea And Vomiting   Exelon [rivastigmine Tartrate]    Lithium Nausea And Vomiting      Medication List       Accurate as of 08/04/16  4:33 PM. Always use your most recent med list.          acetaminophen 325 MG tablet Commonly known as:  TYLENOL Take one tablet by mouth at bedtime, take 650 mg by mouth every 4 hours prn   amLODipine 5 MG tablet Commonly known as:  NORVASC Take 5 mg by mouth daily.   docusate 50 MG/5ML liquid Commonly known as:  COLACE Take 100 mg by mouth daily.   magnesium hydroxide 400 MG/5ML suspension Commonly known as:  MILK OF MAGNESIA Take 30 mLs by mouth daily as needed for mild constipation.   metoCLOPramide 5 MG tablet Commonly known as:  REGLAN Take 5 mg by mouth 2 (two) times daily.   ondansetron 4 MG disintegrating tablet Commonly known as:  ZOFRAN-ODT Dissolve 1 tablet in mouth 4 times daily before meals and at bedtime for nausea   polyethylene glycol packet Commonly known as:  MIRALAX / GLYCOLAX Take 17 g by mouth daily.   potassium chloride 20 MEQ/15ML (10%) Soln Take 20 mEq by mouth daily.   PROTONIX 40 mg/20 mL Pack Generic drug:  pantoprazole sodium Place 40 mg into feeding tube 2 (two) times daily.   ZINC OXIDE (TOPICAL) 10 % Crea Apply 1 application topically 3 (three) times daily. Apply sparingly to buttocks d/t redness       Review of Systems   Essential  unattainable secondary to dementia please see history of present illness  Immunization History  Administered Date(s) Administered  . Influenza-Unspecified 02/13/2014, 02/09/2016   Pertinent  Health Maintenance Due  Topic Date Due  . PNA vac Low Risk Adult (1 of 2 - PCV13) 10/05/2016 (Originally 01/13/2003)  . INFLUENZA VACCINE  Completed  . DEXA SCAN   Completed   No flowsheet data found. Functional Status Survey:  temperature is 98.1 pulse 69 respirations 20 blood pressure 127/71    Physical Exam Constitutional: She appears frail. Lying in bed there is no sign of distress she actually looks a bit more energetic than I have seen recently Her skin is warm and dry HENT:  Head: Normocephalic.  Mouth/Throat: Oropharynx is clear and moist.  Eyes: Pupils are equal, round, and reactive to light.  Neck: Neck supple.  Cardiovascular: Normal rate, regular rhythmand normal heart sounds.  No murmurheard. Pulmonary/Chest: No labored breathing-she has poor respiratory effort does not really follow verbal commands but cannot appreciate any overt congestion . She has no rales. She exhibits no tenderness.  Abdominal: Soft. Bowel sounds are normal. Abdomen his baseline protuberant could not really appreciate any acute tenderness to palpation she will sometimes grimace when her abdomen is palpated  but is not  doing that this evening  Musculoskeletal: She exhibits no edema.  Lymphadenopathy:  She has no cervical adenopathy.  Neurological: She is alert.  Not oriented. Moving all Extremities well. Skin: Skin is warmand dry. No rashnoted. No erythema.  Psychiatric: She is not really agitated with exam although confused--she is alert responsive   Labs reviewed:  Recent Labs  11/27/15 0725  04/11/16 0700  06/02/16 0700  06/11/16 0550 06/12/16 0546 06/15/16 0718  NA 141  < > 139  < > 143  < > 149* 143 143  K 3.6  < > 3.9  < > 3.6  < > 4.2 3.6 3.9  CL 104  < > 103  < > 105  < > 114* 108 105  CO2 28  < > 27  < > 28  < > 23 25 27   GLUCOSE 83  < > 89  < > 101*  < > 133* 104* 92  BUN 39*  < > 40*  < > 37*  < > 30* 36* 46*  CREATININE 1.86*  < > 1.50*  < > 1.53*  < > 1.77* 1.69* 1.70*  CALCIUM 9.4  < > 9.4  < > 9.6  < > 9.6 9.1 9.3  MG 2.4  --  2.6*  --  2.6*  --   --   --   --   PHOS  --   --   --   --   --   --   --  2.3*  --   < > =  values in this interval not displayed.  Recent Labs  05/25/16 0500 06/10/16 1300 06/10/16 1838 06/12/16 0546  AST 25 41 46*  --   ALT 19 41 51  --   ALKPHOS 70 77 75  --   BILITOT 0.7 0.5 0.4  --   PROT 7.3 8.4* 8.1  --   ALBUMIN 3.9 4.4 4.0 3.4*    Recent Labs  05/24/16 0500 06/10/16 1300 06/10/16 1838 06/11/16 0550 06/15/16 0718  WBC 7.5 9.3 9.3 8.5 9.4  NEUTROABS 5.8 8.3* 7.3  --   --   HGB 13.1 15.6* 14.2 14.3 13.1  HCT 41.6 49.8* 44.1 45.1 41.1  MCV 93.1 94.3 93.0 94.0 91.9  PLT 223 262 256 274 231   Lab Results  Component Value Date   TSH 0.749 06/10/2016   Lab Results  Component Value Date   HGBA1C  05/17/2007    5.4 (NOTE)   The ADA recommends the following therapeutic goals for glycemic   control related to Hgb A1C measurement:   Goal of Therapy:   < 7.0% Hgb A1C   Action Suggested:  > 8.0% Hgb A1C   Ref:  Diabetes Care, 22, Suppl. 1, 1999   Lab Results  Component Value Date   CHOL  05/22/2007    175        ATP III CLASSIFICATION:  <200     mg/dL   Desirable  200-239  mg/dL   Borderline High  >=240    mg/dL   High   HDL 37 (L) 05/22/2007   LDLCALC  05/22/2007    96        Total Cholesterol/HDL:CHD Risk Coronary Heart Disease Risk Table                     Men   Women  1/2 Average Risk   3.4   3.3   TRIG 208 (H)  05/22/2007   CHOLHDL 4.7 05/22/2007    Significant Diagnostic Results in last 30 days:  No results found.  Assessment/Plan 1 history gastroparesis-this is been her main issue recently with nausea and vomiting but this has improved with the Reglan which she receives 5 mg twice a day-she is also receiving Zofran 4 times a day before meals and at bedtime-.  also continues on a proton pump inhibitor currently Protonix 40 mg twice a day  Apparently she's had some vomiting recently but had a large bowel movement today which appears to have helped-will add Senokot-S when necessary at night for constipation continues on Colace earlier in the  day alsol MiraLAX physical exam appear be quite benign 9.  #2 history of dementia-this appears relatively stable with supportive care per family does not really want aggressive interventions her daughter actually is a dietitian the facility in very involved and supportive of her mother.  #3 hypertension this appears stable on Norvasc at times she has required clonidine at times for elevated systolics I suspect this is more agitation related and not consistent.  #4 history of breast cancer again conservative follow-up is desired she is no longer on medication.  #5 history of renal sufficiency creatinine of 1.7 on lab done last month appears relatively baseline again we are trying to minimize lab secondary to her daughter's wishes to have as least invasive procedures as possible.  #6 history of hypokalemia this is been stable for some time potassium 3.9 on lab done last month.  [id number]

## 2016-08-15 ENCOUNTER — Non-Acute Institutional Stay (SKILLED_NURSING_FACILITY): Payer: Medicare Other | Admitting: Internal Medicine

## 2016-08-15 DIAGNOSIS — R4182 Altered mental status, unspecified: Secondary | ICD-10-CM

## 2016-08-15 DIAGNOSIS — K5900 Constipation, unspecified: Secondary | ICD-10-CM

## 2016-08-15 NOTE — Progress Notes (Signed)
This is an acute visit.  Level care skilled.  Facility is [facility].  Chief complaint acute visit secondary to change in status.  History of present illness.  Patient is a 81 year old female is been a long-term resident of this facility she does have significant dementia complicated with a history of gastroparesis.  She also has a history of gallstones but no signs currently of cholecystitis.  She's been started on Reglan to help with nausea and vomiting. He is also on Zofran 4 times a day which she has been on long-term  This has helped although apparently she had a couple episodes of vomiting earlier today per her daughter.  Her daughter does not really want aggressive measures and goal is mainly to keep her mother comfortable.  Daughter noticed her to have somewhat increased flushing of her face and appear to be more weak again did have a couple vomiting episodes earlier today.  Her vital signs are stable.  I do see her recently for complaints of constipation and started her on Senokot-S when necessary at night she is also on Colace earlier in the day as well as MiraLAX.  Is not sure exactly how often she is receiving the Senokot.  Past Medical History:  Diagnosis Date  . Cancer ([place])   . Dementia   . Gallstones   . Gastroparalysis   . HTN (hypertension)   . Infiltrating ductal carcinoma of right female breast ([place]) 12/02/2009   Qualifier: History of  By: [name] CMA [name]), June    . Osteoporosis 11/18/2011  . Reflux   . Renal disorder    renal insufficiency.         Past Surgical History:  Procedure Laterality Date  . BREAST BIOPSY    . BREAST LUMPECTOMY    . COLONOSCOPY  2009   Dr. Fuller Plan: normal  . ESOPHAGOGASTRODUODENOSCOPY  12/11/2009   Dr. Fuller Plan: hiatal hernia  . ESOPHAGOGASTRODUODENOSCOPY N/A 01/29/2014   Procedure: ESOPHAGOGASTRODUODENOSCOPY (EGD);  Surgeon: [name], MD;  [facility];  Service: Endoscopy;   Laterality: N/A;  245  . TONSILLECTOMY AND ADENOIDECTOMY          Allergies  Allergen Reactions  . Aricept [Donepezil Hydrochloride] Nausea And Vomiting  . Exelon [Rivastigmine Tartrate]   . Lithium Nausea And Vomiting        Allergies as of 08/04/2016      Reactions   Aricept [donepezil Hydrochloride] Nausea And Vomiting   Exelon [rivastigmine Tartrate]    Lithium Nausea And Vomiting               Medication List               acetaminophen 325 MG tablet Commonly known as:  TYLENOL Take one tablet by mouth at bedtime, take 650 mg by mouth every 4 hours prn   amLODipine 5 MG tablet Commonly known as:  NORVASC Take 5 mg by mouth daily.   docusate 50 MG/5ML liquid Commonly known as:  COLACE Take 100 mg by mouth daily.   magnesium hydroxide 400 MG/5ML suspension Commonly known as:  MILK OF MAGNESIA Take 30 mLs by mouth daily as needed for mild constipation.   metoCLOPramide 5 MG tablet Commonly known as:  REGLAN Take 5 mg by mouth 2 (two) times daily.   ondansetron 4 MG disintegrating tablet Commonly known as:  ZOFRAN-ODT Dissolve 1 tablet in mouth 4 times daily before meals and at bedtime for nausea   polyethylene glycol packet Commonly known  as:  MIRALAX / GLYCOLAX Take 17 g by mouth daily.   potassium chloride 20 MEQ/15ML (10%) Soln Take 20 mEq by mouth daily.   PROTONIX 40 mg/20 mL Pack Generic drug:  pantoprazole sodium Place 40 mg into feeding tube 2 (two) times daily.   ZINC OXIDE (TOPICAL) 10 % Crea Apply 1 application topically 3 (three) times daily. Apply sparingly to buttocks d/t redness     She's also been started on Senokot-S daily at bedtime when necessary constipation    Review of Systems   Essential  unattainable secondary to dementia please see history of present illness      Immunization History  Administered Date(s) Administered  . Influenza-Unspecified 02/13/2014, 02/09/2016         Pertinent  Health Maintenance Due  Topic Date Due  . PNA vac Low Risk Adult (1 of 2 - PCV13) 10/05/2016 (Originally 01/13/2003)  . INFLUENZA VACCINE  Completed  . DEXA SCAN  Completed   No flowsheet data found. Functional Status Survey:  Temperature is 98.6 pulse 75 respirations 17 blood pressure 118/61 O2 is 96% on room air    Physical Exam Constitutional: She appears frail. Lying in bed there is no sign of distress appears weak Her skin is warm and dry--face looks a bit flushed HENT:  Head: Normocephalic.  Mouth/Throat: Would not really open her mouth for exam Eyes: Pupils are equal, round, and reactive to light.  Neck: Neck supple.  Cardiovascular: Normal rate, regular rhythmand normal heart sounds.  No murmurheard. Pulmonary/Chest: No labored breathing-she has poor respiratory effort does not really follow verbal commands but cannot appreciate any overt congestion . She has no rales. She exhibits no tenderness.  Abdominal: Soft. Bowel sounds are normal. Abdomen his baseline protuberant does some mild grimacing when abdomen is palpated but difficult to localize and she will do this at times as reaction to be invasive maneuver   Rectal-I did appreciate a fairly significant amount of hard stool in the rectal vault per digital exam   Musculoskeletal: She exhibits no edema.  Lymphadenopathy:  She has no cervical adenopathy.  Neurological: She is alert.  Not oriented. Moving all Extremities well. Skin: Skin is warmand dry. No rashnoted. No erythema. Her face does appear a bit flushed  Psychiatric: She is not really agitated with exam although confused--she is alert responsive  Labs reviewed:  RecentLabs(withinlast365days)   Recent Labs  11/27/15 0725  04/11/16 0700  06/02/16 0700  06/11/16 0550 06/12/16 0546 06/15/16 0718  NA 141  < > 139  < > 143  < > 149* 143 143  K 3.6  < > 3.9  < > 3.6  < > 4.2 3.6 3.9  CL 104  < > 103  < > 105  < > 114* 108  105  CO2 28  < > 27  < > 28  < > 23 25 27   GLUCOSE 83  < > 89  < > 101*  < > 133* 104* 92  BUN 39*  < > 40*  < > 37*  < > 30* 36* 46*  CREATININE 1.86*  < > 1.50*  < > 1.53*  < > 1.77* 1.69* 1.70*  CALCIUM 9.4  < > 9.4  < > 9.6  < > 9.6 9.1 9.3  MG 2.4  --  2.6*  --  2.6*  --   --   --   --   PHOS  --   --   --   --   --   --   --  2.3*  --   < > = values in this interval not displayed.    RecentLabs(withinlast365days)   Recent Labs  05/25/16 0500 06/10/16 1300 06/10/16 1838 06/12/16 0546  AST 25 41 46*  --   ALT 19 41 51  --   ALKPHOS 70 77 75  --   BILITOT 0.7 0.5 0.4  --   PROT 7.3 8.4* 8.1  --   ALBUMIN 3.9 4.4 4.0 3.4*      RecentLabs(withinlast365days)   Recent Labs  05/24/16 0500 06/10/16 1300 06/10/16 1838 06/11/16 0550 06/15/16 0718  WBC 7.5 9.3 9.3 8.5 9.4  NEUTROABS 5.8 8.3* 7.3  --   --   HGB 13.1 15.6* 14.2 14.3 13.1  HCT 41.6 49.8* 44.1 45.1 41.1  MCV 93.1 94.3 93.0 94.0 91.9  PLT 223 262 256 274 231     RecentLabs       Lab Results  Component Value Date   TSH 0.749 06/10/2016     RecentLabs        Lab Results  Component Value Date   HGBA1C  05/17/2007    5.4 (NOTE)   The ADA recommends the following therapeutic goals for glycemic   control related to Hgb A1C measurement:   Goal of Therapy:   < 7.0% Hgb A1C   Action Suggested:  > 8.0% Hgb A1C   Ref:  Diabetes Care, 22, Suppl. 1, 1999     RecentLabs        Lab Results  Component Value Date   CHOL  05/22/2007    175        ATP III CLASSIFICATION:  <200     mg/dL   Desirable  200-239  mg/dL   Borderline High  >=240    mg/dL   High   HDL 37 (L) 05/22/2007   LDLCALC  05/22/2007    96        Total Cholesterol/HDL:CHD Risk Coronary Heart Disease Risk Table                     Men   Women  1/2 Average Risk   3.4   3.3   TRIG 208 (H) 05/22/2007   CHOLHDL 4.7 05/22/2007      Significant Diagnostic Results in last 30 days:  ImagingResults    Assessment and plan.  #1 increased weakness change in status-she does appear a bit weaker than her baseline again this is somewhat challenging with patient's dementia and relatively nonverbal status-her daughter again states she does not want anything aggressive lab tests or a urinary this point-there is some suspicion constipation may be contributing to vomiting and her status this evening-I didappreciate a fairly significant amount of stool in the rectal vault-will order an enema-also will write to make Senokot routine at night-at this point continue her Colace in the morning as well as MiraLAX-she continues as well on Reglan twice a day as well as Zofran 4 times a day with her history of gastroparesis nausea and vomiting  Monitor vital signs every shift for 24 hours as well.  [id number]

## 2016-08-25 ENCOUNTER — Encounter (HOSPITAL_COMMUNITY)
Admission: RE | Admit: 2016-08-25 | Discharge: 2016-08-25 | Disposition: A | Discharge status: Home / Self Care | Payer: Medicare Other | Source: Skilled Nursing Facility | Attending: Internal Medicine | Admitting: Internal Medicine

## 2016-08-25 DIAGNOSIS — R112 Nausea with vomiting, unspecified: Secondary | ICD-10-CM | POA: Insufficient documentation

## 2016-08-25 DIAGNOSIS — Z853 Personal history of malignant neoplasm of breast: Secondary | ICD-10-CM | POA: Insufficient documentation

## 2016-08-25 DIAGNOSIS — R1311 Dysphagia, oral phase: Secondary | ICD-10-CM | POA: Insufficient documentation

## 2016-08-25 DIAGNOSIS — F319 Bipolar disorder, unspecified: Secondary | ICD-10-CM | POA: Diagnosis not present

## 2016-08-25 DIAGNOSIS — Z9181 History of falling: Secondary | ICD-10-CM | POA: Diagnosis not present

## 2016-08-25 DIAGNOSIS — F0391 Unspecified dementia with behavioral disturbance: Secondary | ICD-10-CM | POA: Diagnosis not present

## 2016-08-25 LAB — CBC WITH DIFFERENTIAL/PLATELET
Basophils Absolute: 0 10*3/uL (ref 0.0–0.1)
Basophils Relative: 1 %
Eosinophils Absolute: 0.2 10*3/uL (ref 0.0–0.7)
Eosinophils Relative: 3 %
HEMATOCRIT: 38.8 % (ref 36.0–46.0)
HEMOGLOBIN: 12.6 g/dL (ref 12.0–15.0)
LYMPHS PCT: 26 %
Lymphs Abs: 1.4 10*3/uL (ref 0.7–4.0)
MCH: 29 pg (ref 26.0–34.0)
MCHC: 32.5 g/dL (ref 30.0–36.0)
MCV: 89.4 fL (ref 78.0–100.0)
MONOS PCT: 12 %
Monocytes Absolute: 0.6 10*3/uL (ref 0.1–1.0)
NEUTROS ABS: 3 10*3/uL (ref 1.7–7.7)
NEUTROS PCT: 58 %
Platelets: 222 10*3/uL (ref 150–400)
RBC: 4.34 MIL/uL (ref 3.87–5.11)
RDW: 13.6 % (ref 11.5–15.5)
WBC: 5.2 10*3/uL (ref 4.0–10.5)

## 2016-08-25 LAB — BASIC METABOLIC PANEL
ANION GAP: 10 (ref 5–15)
BUN: 48 mg/dL — ABNORMAL HIGH (ref 6–20)
CALCIUM: 9.5 mg/dL (ref 8.9–10.3)
CHLORIDE: 102 mmol/L (ref 101–111)
CO2: 27 mmol/L (ref 22–32)
Creatinine, Ser: 1.99 mg/dL — ABNORMAL HIGH (ref 0.44–1.00)
GFR calc non Af Amer: 23 mL/min — ABNORMAL LOW (ref >60)
GFR, EST AFRICAN AMERICAN: 27 mL/min — AB (ref >60)
GLUCOSE: 88 mg/dL (ref 65–99)
Potassium: 3.5 mmol/L (ref 3.5–5.1)
Sodium: 139 mmol/L (ref 135–145)

## 2016-08-30 ENCOUNTER — Encounter: Payer: Self-pay | Admitting: Internal Medicine

## 2016-08-30 ENCOUNTER — Non-Acute Institutional Stay (SKILLED_NURSING_FACILITY): Payer: Medicare Other | Admitting: Internal Medicine

## 2016-08-30 DIAGNOSIS — K219 Gastro-esophageal reflux disease without esophagitis: Secondary | ICD-10-CM | POA: Diagnosis not present

## 2016-08-30 DIAGNOSIS — I10 Essential (primary) hypertension: Secondary | ICD-10-CM | POA: Diagnosis not present

## 2016-08-30 DIAGNOSIS — F0281 Dementia in other diseases classified elsewhere with behavioral disturbance: Secondary | ICD-10-CM | POA: Diagnosis not present

## 2016-08-30 DIAGNOSIS — G309 Alzheimer's disease, unspecified: Secondary | ICD-10-CM | POA: Diagnosis not present

## 2016-08-30 DIAGNOSIS — K3184 Gastroparesis: Secondary | ICD-10-CM | POA: Diagnosis not present

## 2016-08-30 NOTE — Progress Notes (Signed)
Location:   [facility] Room Number: 110/W Place of Service:  SNF [phone]) Provider:  Clydene Fake, MD  Patient Care Team: [name], MD as PCP - General (Internal Medicine) [name], MD as Consulting Physician (Gastroenterology)  Extended Emergency Contact Information Primary Emergency Contact: [name] Address: [address]          [place], [place] [address] [name] of [place] Phone: [phone] Work Phone: [phone] Mobile Phone: [phone] Relation: Daughter Secondary Emergency Contact: [name] Address: [address]          [place], [place] [address] [name] of [place] Phone: [phone] Mobile Phone: [phone] Relation: Relative  Code Status:  DNR Goals of care: Advanced Directive information Advanced Directives 08/30/2016  Does Patient Have a Medical Advance Directive? Yes  Type of Advance Directive Out of facility DNR (pink MOST or yellow form)  Does patient want to make changes to medical advance directive? No - Patient declined  Copy of [place] in Chart? -  Would patient like information on creating a medical advance directive? No - Patient declined  Pre-existing out of facility DNR order (yellow form or pink MOST form) -     Chief Complaint  Patient presents with  . Medical Management of Chronic Issues    Routine Visit    HPI:  Pt is a 81 y.o. female seen today for medical management of chronic diseases.    Patient has h/o hypertension, Advanced Dementia, Chronic renal disease, Breast cancer s/p Mastectomy, GERD and Gastroparesis.  Patient is stable in facility. She still has few episodes of Nausea and Vomiting . Her intake still stays low . The Nurses are able to give her some ensure. She is still not able to eat much. Her Vomiting mostly in the morning is sporadic. Her weight has stayed stable at 135 lbs. No Other Nursing issues. Past Medical History:    Diagnosis Date  . Cancer ([place])   . Dementia   . Gallstones   . Gastroparalysis   . HTN (hypertension)   . Infiltrating ductal carcinoma of right female breast ([place]) 12/02/2009   Qualifier: History of  By: [name] CMA [name]), June    . Osteoporosis 11/18/2011  . Reflux   . Renal disorder    renal insufficiency.    Past Surgical History:  Procedure Laterality Date  . BREAST BIOPSY    . BREAST LUMPECTOMY    . COLONOSCOPY  2009   Dr. Fuller Plan: normal  . ESOPHAGOGASTRODUODENOSCOPY  12/11/2009   Dr. Fuller Plan: hiatal hernia  . ESOPHAGOGASTRODUODENOSCOPY N/A 01/29/2014   Procedure: ESOPHAGOGASTRODUODENOSCOPY (EGD);  Surgeon: [name], MD;  [facility];  Service: Endoscopy;  Laterality: N/A;  245  . TONSILLECTOMY AND ADENOIDECTOMY      Allergies  Allergen Reactions  . Aricept [Donepezil Hydrochloride] Nausea And Vomiting  . Exelon [Rivastigmine Tartrate]   . Lithium Nausea And Vomiting    Allergies as of 08/30/2016      Reactions   Aricept [donepezil Hydrochloride] Nausea And Vomiting   Exelon [rivastigmine Tartrate]    Lithium Nausea And Vomiting      Medication List       Accurate as of 08/30/16  9:50 AM. Always use your most recent med list.          acetaminophen 325 MG tablet Commonly known as:  TYLENOL Take one tablet by mouth at bedtime, take 650 mg by mouth every 4 hours prn  amLODipine 5 MG tablet Commonly known as:  NORVASC Take 5 mg by mouth daily.   docusate 50 MG/5ML liquid Commonly known as:  COLACE Take 100 mg by mouth daily.   magnesium hydroxide 400 MG/5ML suspension Commonly known as:  MILK OF MAGNESIA Take 30 mLs by mouth daily as needed for mild constipation.   metoCLOPramide 5 MG tablet Commonly known as:  REGLAN Take 5 mg by mouth 2 (two) times daily.   omeprazole 20 MG capsule Commonly known as:  PRILOSEC Take 20 mg by mouth daily.   ondansetron 4 MG disintegrating tablet Commonly known as:  ZOFRAN-ODT Dissolve 1  tablet in mouth 4 times daily before meals and at bedtime for nausea   polyethylene glycol packet Commonly known as:  MIRALAX / GLYCOLAX Take 17 g by mouth daily.   potassium chloride 20 MEQ/15ML (10%) Soln Take 40 mEq by mouth daily.   senna-docusate 8.6-50 MG tablet Commonly known as:  Senokot-S Take 2 tablets by mouth at bedtime.       Review of Systems  Unable to perform ROS: Dementia    Immunization History  Administered Date(s) Administered  . Influenza-Unspecified 02/13/2014, 02/09/2016   Pertinent  Health Maintenance Due  Topic Date Due  . PNA vac Low Risk Adult (1 of 2 - PCV13) 10/05/2016 (Originally 01/13/2003)  . INFLUENZA VACCINE  12/07/2016  . DEXA SCAN  Completed   No flowsheet data found. Functional Status Survey:    Vitals:   08/30/16 0939  BP: (!) 146/75  Pulse: 62  Resp: 20  Temp: 98.2 F (36.8 C)  TempSrc: Oral  SpO2: 92%   There is no height or weight on file to calculate BMI. Physical Exam  Constitutional: She appears well-developed and well-nourished.  HENT:  Head: Normocephalic.  Mouth/Throat: Oropharynx is clear and moist.  Eyes: Pupils are equal, round, and reactive to light.  Neck: Neck supple.  Cardiovascular: Normal rate, regular rhythm and normal heart sounds.   No murmur heard. Pulmonary/Chest: Effort normal and breath sounds normal. No respiratory distress. She has no wheezes. She has no rales.  Abdominal: Soft. Bowel sounds are normal. She exhibits no distension. There is no tenderness.  Musculoskeletal: She exhibits no edema.  Neurological: She is alert.  Not oriented and does not follow any commands.  Skin: Skin is warm and dry.  Psychiatric: She has a normal mood and affect.    Labs reviewed:  Recent Labs  11/27/15 0725  04/11/16 0700  06/02/16 0700  06/12/16 0546 06/15/16 0718 08/25/16 0700  NA 141  < > 139  < > 143  < > 143 143 139  K 3.6  < > 3.9  < > 3.6  < > 3.6 3.9 3.5  CL 104  < > 103  < > 105  < > 108  105 102  CO2 28  < > 27  < > 28  < > 25 27 27   GLUCOSE 83  < > 89  < > 101*  < > 104* 92 88  BUN 39*  < > 40*  < > 37*  < > 36* 46* 48*  CREATININE 1.86*  < > 1.50*  < > 1.53*  < > 1.69* 1.70* 1.99*  CALCIUM 9.4  < > 9.4  < > 9.6  < > 9.1 9.3 9.5  MG 2.4  --  2.6*  --  2.6*  --   --   --   --   PHOS  --   --   --   --   --   --  2.3*  --   --   < > = values in this interval not displayed.  Recent Labs  05/25/16 0500 06/10/16 1300 06/10/16 1838 06/12/16 0546  AST 25 41 46*  --   ALT 19 41 51  --   ALKPHOS 70 77 75  --   BILITOT 0.7 0.5 0.4  --   PROT 7.3 8.4* 8.1  --   ALBUMIN 3.9 4.4 4.0 3.4*    Recent Labs  06/10/16 1300 06/10/16 1838 06/11/16 0550 06/15/16 0718 08/25/16 0700  WBC 9.3 9.3 8.5 9.4 5.2  NEUTROABS 8.3* 7.3  --   --  3.0  HGB 15.6* 14.2 14.3 13.1 12.6  HCT 49.8* 44.1 45.1 41.1 38.8  MCV 94.3 93.0 94.0 91.9 89.4  PLT 262 256 274 231 222   Lab Results  Component Value Date   TSH 0.749 06/10/2016   Lab Results  Component Value Date   HGBA1C  05/17/2007    5.4 (NOTE)   The ADA recommends the following therapeutic goals for glycemic   control related to Hgb A1C measurement:   Goal of Therapy:   < 7.0% Hgb A1C   Action Suggested:  > 8.0% Hgb A1C   Ref:  Diabetes Care, 22, Suppl. 1, 1999   Lab Results  Component Value Date   CHOL  05/22/2007    175        ATP III CLASSIFICATION:  <200     mg/dL   Desirable  200-239  mg/dL   Borderline High  >=240    mg/dL   High   HDL 37 (L) 05/22/2007   LDLCALC  05/22/2007    96        Total Cholesterol/HDL:CHD Risk Coronary Heart Disease Risk Table                     Men   Women  1/2 Average Risk   3.4   3.3   TRIG 208 (H) 05/22/2007   CHOLHDL 4.7 05/22/2007    Significant Diagnostic Results in last 30 days:  No results found.  Assessment/Plan Gastroparesis Patient is on Reglan but still has episodes of Vomiting. Her weight is staying stable. Her BUN and creat were slightly high but I have d/w her  daughter at this time doing any IV fluids is not going to solve anything. And per her Daughter she wants her Mom to stay comfortably. Will not do any Hospitalization or IV Fluids. Will continue Reglan.  Gastroesophageal reflux disease  Continue on Omeprazole  Essential hypertension Stable on Norvasc.  Alzheimer's dementia Stable Continue Supportive care. MOST Form in the chart   Family/ staff Communication:   Labs/tests ordered:    Total time spent in this patient care encounter was 25_ minutes; greater than 50% of the visit spent  coordinating care for problems addressed at this encounter.

## 2016-09-30 ENCOUNTER — Encounter: Payer: Self-pay | Admitting: Internal Medicine

## 2016-09-30 ENCOUNTER — Non-Acute Institutional Stay (SKILLED_NURSING_FACILITY): Payer: Medicare Other | Admitting: Internal Medicine

## 2016-09-30 DIAGNOSIS — K3184 Gastroparesis: Secondary | ICD-10-CM

## 2016-09-30 DIAGNOSIS — F0281 Dementia in other diseases classified elsewhere with behavioral disturbance: Secondary | ICD-10-CM | POA: Diagnosis not present

## 2016-09-30 DIAGNOSIS — N289 Disorder of kidney and ureter, unspecified: Secondary | ICD-10-CM

## 2016-09-30 DIAGNOSIS — I10 Essential (primary) hypertension: Secondary | ICD-10-CM

## 2016-09-30 DIAGNOSIS — G309 Alzheimer's disease, unspecified: Secondary | ICD-10-CM

## 2016-09-30 NOTE — Progress Notes (Signed)
Location:   [facility] Room Number: 110/W Place of Service:  SNF [phone]) Provider:  Gardiner Fanti, Rene Kocher, MD  Patient Care Team: [name], MD as PCP - General (Internal Medicine) [name], [name], MD as Consulting Physician (Gastroenterology)  Extended Emergency Contact Information Primary Emergency Contact: [name] Address: [address]          [place], [place] [address] [name] of [place] Phone: [phone] Work Phone: [phone] Mobile Phone: [phone] Relation: Daughter Secondary Emergency Contact: [name] Address: [address]          [place], [place] [address] [name] of [place] Phone: [phone] Mobile Phone: [phone] Relation: Relative  Code Status:  DNR Goals of care: Advanced Directive information Advanced Directives 09/30/2016  Does Patient Have a Medical Advance Directive? Yes  Type of Advance Directive Out of facility DNR (pink MOST or yellow form)  Does patient want to make changes to medical advance directive? No - Patient declined  Copy of [place] in Chart? -  Would patient like information on creating a medical advance directive? No - Patient declined  Pre-existing out of facility DNR order (yellow form or pink MOST form) -     Chief Complaint  Patient presents with  . Medical Management of Chronic Issues    Routine Visit   For medical management of chronic medical issues including dementia-running kidney disease-gastroparesis-hypokalemia-hypertension HPI:  Pt is a 81 y.o. female seen today for medical management of chronic diseases. As noted above.  Her by mouth intake continues to be fairly poor she does take her supplements-has had some episodes of nausea and vomiting but apparently these have stabilized somewhat she does have a history of gastroparesis continues on Zofran routinely as well as Reglan.  He's actually gained a small amount weight appears  about 4 pounds the past month or so.  She is a poor historian secondary to dementia but nursing staff does not report any acute issues.  Her daughter feels she may have a little bit of edema of her feet which is new     Past Medical History:  Diagnosis Date  . Cancer ([facility])   . Dementia   . Gallstones   . Gastroparalysis   . HTN (hypertension)   . Infiltrating ductal carcinoma of right female breast ([facility]) 12/02/2009   Qualifier: History of  By: [name] CMA [name]), June    . Osteoporosis 11/18/2011  . Reflux   . Renal disorder    renal insufficiency.    Past Surgical History:  Procedure Laterality Date  . BREAST BIOPSY    . BREAST LUMPECTOMY    . COLONOSCOPY  2009   Dr. Fuller Plan: normal  . ESOPHAGOGASTRODUODENOSCOPY  12/11/2009   Dr. Fuller Plan: hiatal hernia  . ESOPHAGOGASTRODUODENOSCOPY N/A 01/29/2014   Procedure: ESOPHAGOGASTRODUODENOSCOPY (EGD);  Surgeon: [name], MD;  [facility];  Service: Endoscopy;  Laterality: N/A;  245  . TONSILLECTOMY AND ADENOIDECTOMY      Allergies  Allergen Reactions  . Aricept [Donepezil Hydrochloride] Nausea And Vomiting  . Exelon [Rivastigmine Tartrate]   . Lithium Nausea And Vomiting    Outpatient Encounter Prescriptions as of 09/30/2016  Medication Sig  . acetaminophen (TYLENOL) 325 MG tablet Take one tablet by mouth at bedtime, take 650 mg by mouth every 4 hours prn  . amLODipine (NORVASC) 5 MG tablet Take 5 mg by mouth daily.  [name] docusate (COLACE) 50 MG/5ML liquid Take 100 mg by mouth  daily.  . magnesium hydroxide (MILK OF MAGNESIA) 400 MG/5ML suspension Take 30 mLs by mouth daily as needed for mild constipation.  . metoCLOPramide (REGLAN) 5 MG tablet Take 5 mg by mouth 2 (two) times daily.  [name] omeprazole (PRILOSEC) 20 MG capsule Take 20 mg by mouth daily.  . ondansetron (ZOFRAN-ODT) 4 MG disintegrating tablet Dissolve 1 tablet in mouth 4 times daily before meals and at bedtime for nausea  . polyethylene glycol (MIRALAX /  GLYCOLAX) packet Take 17 g by mouth daily.    . potassium chloride 20 MEQ/15ML (10%) SOLN Take 40 mEq by mouth daily.   [name] senna-docusate (SENOKOT-S) 8.6-50 MG tablet Take 2 tablets by mouth at bedtime.    No facility-administered encounter medications on file as of 09/30/2016.      Review of Systems   Essentially unattainable secondary to dementia  Immunization History  Administered Date(s) Administered  . Influenza-Unspecified 02/13/2014, 02/09/2016   Pertinent  Health Maintenance Due  Topic Date Due  . PNA vac Low Risk Adult (1 of 2 - PCV13) 10/05/2016 (Originally 01/13/2003)  . INFLUENZA VACCINE  12/07/2016  . DEXA SCAN  Completed   No flowsheet data found. Functional Status Survey:    Temperature is 97.2 pulse 61 respirations 20 blood pressure 127/67 weight is 139.8  Physical Exam   Constitutional: She appears frail. Lying in bed there is no sign of distress  Her skin is warm and dry-- HENT:  Head: Normocephalic.  Mouth/Throat: Would not really open her mouth for exam Eyes: Pupils are equal, round, and reactive to light.  Neck: Neck supple.  Cardiovascular: Normal rate, regular rhythmand normal heart sounds. Has some slight lower extremity pedal edema bilaterally No murmurheard. Pulmonary/Chest: No labored breathing-she has poor respiratory effort does not really follow verbal commands but cannot appreciate any overt congestion . She has no rales. She exhibits no tenderness.  Abdominal: Soft. Bowel sounds are normal. Abdomen his baseline protuberant does some mild grimacing when abdomen is palpated but difficult to localize and she will do this at times as reaction to be invasive maneuver   Rectal-I did appreciate a fairly significant amount of hard stool in the rectal vault per digital exam   Musculoskeletal: Moves all extremities 4 with frailty  Lymphadenopathy:  She has no cervical adenopathy.  Neurological: She is alert.  Not oriented. Moving all  Extremities well. Skin: Skin is warmand dry. No rashnoted. No erythema.  Psychiatric: She is not really agitated with exam although confused--she is alert responsive   Labs reviewed:  Recent Labs  11/27/15 0725  04/11/16 0700  06/02/16 0700  06/12/16 0546 06/15/16 0718 08/25/16 0700  NA 141  < > 139  < > 143  < > 143 143 139  K 3.6  < > 3.9  < > 3.6  < > 3.6 3.9 3.5  CL 104  < > 103  < > 105  < > 108 105 102  CO2 28  < > 27  < > 28  < > 25 27 27   GLUCOSE 83  < > 89  < > 101*  < > 104* 92 88  BUN 39*  < > 40*  < > 37*  < > 36* 46* 48*  CREATININE 1.86*  < > 1.50*  < > 1.53*  < > 1.69* 1.70* 1.99*  CALCIUM 9.4  < > 9.4  < > 9.6  < > 9.1 9.3 9.5  MG 2.4  --  2.6*  --  2.6*  --   --   --   --  PHOS  --   --   --   --   --   --  2.3*  --   --   < > = values in this interval not displayed.  Recent Labs  05/25/16 0500 06/10/16 1300 06/10/16 1838 06/12/16 0546  AST 25 41 46*  --   ALT 19 41 51  --   ALKPHOS 70 77 75  --   BILITOT 0.7 0.5 0.4  --   PROT 7.3 8.4* 8.1  --   ALBUMIN 3.9 4.4 4.0 3.4*    Recent Labs  06/10/16 1300 06/10/16 1838 06/11/16 0550 06/15/16 0718 08/25/16 0700  WBC 9.3 9.3 8.5 9.4 5.2  NEUTROABS 8.3* 7.3  --   --  3.0  HGB 15.6* 14.2 14.3 13.1 12.6  HCT 49.8* 44.1 45.1 41.1 38.8  MCV 94.3 93.0 94.0 91.9 89.4  PLT 262 256 274 231 222   Lab Results  Component Value Date   TSH 0.749 06/10/2016   Lab Results  Component Value Date   HGBA1C  05/17/2007    5.4 (NOTE)   The ADA recommends the following therapeutic goals for glycemic   control related to Hgb A1C measurement:   Goal of Therapy:   < 7.0% Hgb A1C   Action Suggested:  > 8.0% Hgb A1C   Ref:  Diabetes Care, 22, Suppl. 1, 1999   Lab Results  Component Value Date   CHOL  05/22/2007    175        ATP III CLASSIFICATION:  <200     mg/dL   Desirable  200-239  mg/dL   Borderline High  >=240    mg/dL   High   HDL 37 (L) 05/22/2007   LDLCALC  05/22/2007    96        Total  Cholesterol/HDL:CHD Risk Coronary Heart Disease Risk Table                     Men   Women  1/2 Average Risk   3.4   3.3   TRIG 208 (H) 05/22/2007   CHOLHDL 4.7 05/22/2007    Significant Diagnostic Results in last 30 days:  No results found.  Assessment/Plan  1 dementia she appears to be doing relatively well with supportive care she does have a most form her weight appears to be stable slightly gaining.  #2 hypertension this appears stable and Norvasc.  #3 chronic kidney disease this appears relatively stable with a creatinine of 1.99 BUN of 48 on lab done last month will update this.  #4 history of gastroparesis continues on Reglan as well as Zofran-has had episodes of nausea and vomiting but apparently these have gradually diminished.  Her appetite continues to be poor however.  #5 history of hypokalemia this is being supplemented again will update a BMP.  #6 history of breast cancer-she is no longer on medication for this with emphasis on conservative follow-up:    #6 mild lower extremity edema will order a BNP as well this appears to be more dependent related per her daughter,.  [id number]

## 2016-10-04 ENCOUNTER — Encounter (HOSPITAL_COMMUNITY)
Admission: RE | Admit: 2016-10-04 | Discharge: 2016-10-04 | Disposition: A | Discharge status: Home / Self Care | Payer: Medicare Other | Source: Skilled Nursing Facility | Attending: Internal Medicine | Admitting: Internal Medicine

## 2016-10-04 ENCOUNTER — Encounter: Payer: Self-pay | Admitting: Internal Medicine

## 2016-10-04 ENCOUNTER — Non-Acute Institutional Stay (SKILLED_NURSING_FACILITY): Payer: Medicare Other | Admitting: Internal Medicine

## 2016-10-04 DIAGNOSIS — I10 Essential (primary) hypertension: Secondary | ICD-10-CM | POA: Insufficient documentation

## 2016-10-04 DIAGNOSIS — Z9181 History of falling: Secondary | ICD-10-CM | POA: Diagnosis not present

## 2016-10-04 DIAGNOSIS — N289 Disorder of kidney and ureter, unspecified: Secondary | ICD-10-CM

## 2016-10-04 DIAGNOSIS — R1311 Dysphagia, oral phase: Secondary | ICD-10-CM | POA: Insufficient documentation

## 2016-10-04 DIAGNOSIS — F319 Bipolar disorder, unspecified: Secondary | ICD-10-CM | POA: Insufficient documentation

## 2016-10-04 DIAGNOSIS — R7989 Other specified abnormal findings of blood chemistry: Secondary | ICD-10-CM | POA: Diagnosis not present

## 2016-10-04 DIAGNOSIS — F0391 Unspecified dementia with behavioral disturbance: Secondary | ICD-10-CM | POA: Insufficient documentation

## 2016-10-04 DIAGNOSIS — Z853 Personal history of malignant neoplasm of breast: Secondary | ICD-10-CM | POA: Insufficient documentation

## 2016-10-04 DIAGNOSIS — R112 Nausea with vomiting, unspecified: Secondary | ICD-10-CM | POA: Insufficient documentation

## 2016-10-04 LAB — BASIC METABOLIC PANEL
ANION GAP: 9 (ref 5–15)
BUN: 52 mg/dL — ABNORMAL HIGH (ref 6–20)
CALCIUM: 9.5 mg/dL (ref 8.9–10.3)
CHLORIDE: 105 mmol/L (ref 101–111)
CO2: 26 mmol/L (ref 22–32)
Creatinine, Ser: 2.07 mg/dL — ABNORMAL HIGH (ref 0.44–1.00)
GFR calc Af Amer: 25 mL/min — ABNORMAL LOW (ref >60)
GFR calc non Af Amer: 22 mL/min — ABNORMAL LOW (ref >60)
GLUCOSE: 91 mg/dL (ref 65–99)
POTASSIUM: 4 mmol/L (ref 3.5–5.1)
Sodium: 140 mmol/L (ref 135–145)

## 2016-10-04 LAB — BRAIN NATRIURETIC PEPTIDE: B Natriuretic Peptide: 625 pg/mL — ABNORMAL HIGH (ref 0.0–100.0)

## 2016-10-04 NOTE — Progress Notes (Signed)
Location:   [facility] Room Number: 110/W Place of Service:  SNF [phone]) Provider:  Freddi Starr, MD  Patient Care Team: [name], MD as PCP - General (Internal Medicine) [name], [name], MD as Consulting Physician (Gastroenterology)  Extended Emergency Contact Information Primary Emergency Contact: [name] Address: [address]          [place], [place] [address] [name] of [place] Phone: [phone] Work Phone: [phone] Mobile Phone: [phone] Relation: Daughter Secondary Emergency Contact: [name] Address: [address]          [place], [place] [address] [name] of [place] Phone: [phone] Mobile Phone: [phone] Relation: Relative  Code Status:  DNR Goals of care: Advanced Directive information Advanced Directives 10/04/2016  Does Patient Have a Medical Advance Directive? Yes  Type of Advance Directive Out of facility DNR (pink MOST or yellow form)  Does patient want to make changes to medical advance directive? No - Patient declined  Copy of [place] in Chart? -  Would patient like information on creating a medical advance directive? No - Patient declined  Pre-existing out of facility DNR order (yellow form or pink MOST form) -     Chief Complaint  Patient presents with  . Acute Visit    F/U    HPI:  Pt is a 81 y.o. female seen today for an acute visit for    Past Medical History:  Diagnosis Date  . Cancer ([facility])   . Dementia   . Gallstones   . Gastroparalysis   . HTN (hypertension)   . Infiltrating ductal carcinoma of right female breast ([facility]) 12/02/2009   Qualifier: History of  By: [name] CMA [name]), June    . Osteoporosis 11/18/2011  . Reflux   . Renal disorder    renal insufficiency.    Past Surgical History:  Procedure Laterality Date  . BREAST BIOPSY    . BREAST LUMPECTOMY    . COLONOSCOPY  2009   Dr. Fuller Plan: normal  .  ESOPHAGOGASTRODUODENOSCOPY  12/11/2009   Dr. Fuller Plan: hiatal hernia  . ESOPHAGOGASTRODUODENOSCOPY N/A 01/29/2014   Procedure: ESOPHAGOGASTRODUODENOSCOPY (EGD);  Surgeon: [name], MD;  [facility];  Service: Endoscopy;  Laterality: N/A;  245  . TONSILLECTOMY AND ADENOIDECTOMY      Allergies  Allergen Reactions  . Aricept [Donepezil Hydrochloride] Nausea And Vomiting  . Exelon [Rivastigmine Tartrate]   . Lithium Nausea And Vomiting    Outpatient Encounter Prescriptions as of 10/04/2016  Medication Sig  . acetaminophen (TYLENOL) 325 MG tablet Take one tablet by mouth at bedtime, take 650 mg by mouth every 4 hours prn  . amLODipine (NORVASC) 5 MG tablet Take 5 mg by mouth daily.  [name] docusate (COLACE) 50 MG/5ML liquid Take 100 mg by mouth daily.  . magnesium hydroxide (MILK OF MAGNESIA) 400 MG/5ML suspension Take 30 mLs by mouth daily as needed for mild constipation.  . metoCLOPramide (REGLAN) 5 MG tablet Take 5 mg by mouth 2 (two) times daily.  [name] omeprazole (PRILOSEC) 20 MG capsule Take 20 mg by mouth daily.  . ondansetron (ZOFRAN-ODT) 4 MG disintegrating tablet Dissolve 1 tablet in mouth 4 times daily before meals and at bedtime for nausea  . polyethylene glycol (MIRALAX / GLYCOLAX) packet Take 17 g by mouth daily.    . potassium chloride 20 MEQ/15ML (10%) SOLN Take 40 mEq by mouth daily.   [name] senna-docusate (SENOKOT-S) 8.6-50 MG tablet Take 2  tablets by mouth at bedtime.    No facility-administered encounter medications on file as of 10/04/2016.     Review of Systems  Immunization History  Administered Date(s) Administered  . Influenza-Unspecified 02/13/2014, 02/09/2016   Pertinent  Health Maintenance Due  Topic Date Due  . PNA vac Low Risk Adult (1 of 2 - PCV13) 10/05/2016 (Originally 01/13/2003)  . INFLUENZA VACCINE  12/07/2016  . DEXA SCAN  Completed   No flowsheet data found. Functional Status Survey:    Vitals:   10/04/16 1407  BP: 118/75  Pulse: 60    Resp: (!) 21  Temp: 99.2 F (37.3 C)  TempSrc: Oral   There is no height or weight on file to calculate BMI. Physical Exam  Labs reviewed:  Recent Labs  11/27/15 0725  04/11/16 0700  06/02/16 0700  06/12/16 0546 06/15/16 0718 08/25/16 0700 10/04/16 0715  NA 141  < > 139  < > 143  < > 143 143 139 140  K 3.6  < > 3.9  < > 3.6  < > 3.6 3.9 3.5 4.0  CL 104  < > 103  < > 105  < > 108 105 102 105  CO2 28  < > 27  < > 28  < > 25 27 27 26   GLUCOSE 83  < > 89  < > 101*  < > 104* 92 88 91  BUN 39*  < > 40*  < > 37*  < > 36* 46* 48* 52*  CREATININE 1.86*  < > 1.50*  < > 1.53*  < > 1.69* 1.70* 1.99* 2.07*  CALCIUM 9.4  < > 9.4  < > 9.6  < > 9.1 9.3 9.5 9.5  MG 2.4  --  2.6*  --  2.6*  --   --   --   --   --   PHOS  --   --   --   --   --   --  2.3*  --   --   --   < > = values in this interval not displayed.  Recent Labs  05/25/16 0500 06/10/16 1300 06/10/16 1838 06/12/16 0546  AST 25 41 46*  --   ALT 19 41 51  --   ALKPHOS 70 77 75  --   BILITOT 0.7 0.5 0.4  --   PROT 7.3 8.4* 8.1  --   ALBUMIN 3.9 4.4 4.0 3.4*    Recent Labs  06/10/16 1300 06/10/16 1838 06/11/16 0550 06/15/16 0718 08/25/16 0700  WBC 9.3 9.3 8.5 9.4 5.2  NEUTROABS 8.3* 7.3  --   --  3.0  HGB 15.6* 14.2 14.3 13.1 12.6  HCT 49.8* 44.1 45.1 41.1 38.8  MCV 94.3 93.0 94.0 91.9 89.4  PLT 262 256 274 231 222   Lab Results  Component Value Date   TSH 0.749 06/10/2016   Lab Results  Component Value Date   HGBA1C  05/17/2007    5.4 (NOTE)   The ADA recommends the following therapeutic goals for glycemic   control related to Hgb A1C measurement:   Goal of Therapy:   < 7.0% Hgb A1C   Action Suggested:  > 8.0% Hgb A1C   Ref:  Diabetes Care, 22, Suppl. 1, 1999   Lab Results  Component Value Date   CHOL  05/22/2007    175        ATP III CLASSIFICATION:  <200     mg/dL   Desirable  200-239  mg/dL   Borderline High  >=240    mg/dL   High   HDL 37 (L) 05/22/2007   LDLCALC  05/22/2007    96         Total Cholesterol/HDL:CHD Risk Coronary Heart Disease Risk Table                     Men   Women  1/2 Average Risk   3.4   3.3   TRIG 208 (H) 05/22/2007   CHOLHDL 4.7 05/22/2007    Significant Diagnostic Results in last 30 days:  No results found.  Assessment/Plan There are no diagnoses linked to this encounter.      Oralia Manis, [place] [phone]   This encounter was created in error - please disregard.

## 2016-10-04 NOTE — Progress Notes (Signed)
Location:   [facility] Room Number: 110/W Place of Service:  SNF [phone]) Provider:  Freddi Starr, MD  Patient Care Team: [name], MD as PCP - General (Internal Medicine) [name], [name], MD as Consulting Physician (Gastroenterology)  Extended Emergency Contact Information Primary Emergency Contact: [name] Address: [address]          [place], [place] [address] [name] of [place] Phone: [phone] Work Phone: [phone] Mobile Phone: [phone] Relation: Daughter Secondary Emergency Contact: [name] Address: [address]          [place], [place] [address] [name] of [place] Phone: [phone] Mobile Phone: [phone] Relation: Relative  Code Status:  DNR Goals of care: Advanced Directive information Advanced Directives 10/04/2016  Does Patient Have a Medical Advance Directive? Yes  Type of Advance Directive Out of facility DNR (pink MOST or yellow form)  Does patient want to make changes to medical advance directive? No - Patient declined  Copy of [place] in Chart? -  Would patient like information on creating a medical advance directive? No - Patient declined  Pre-existing out of facility DNR order (yellow form or pink MOST form) -     Chief Complaint  Patient presents with  . Acute Visit    F/U  Of labs indicating renal insufficiency-also mildly elevated be BNP  HPI:  Pt is a 81 y.o. female seen today for an acute visit for follow-up of renal insufficiency as well as elevated BNP with some mild pedal edema  Patient is a long-term resident of the facility with numerous medical diagnoses including dementia chronic kidney disease and gastroparesis hypokalemia hypertension.   She does have a history of renal insufficiency with baseline creatinine more in the higher ones recently updated lab today shows it is mildly elevated from baseline at 2.07 with a BUN of 52  We  also ordered a BMP secondary to some mild foot edema and this was elevated at 625.  Today the edema actually appears to be somewhat improved she is not short of breath and does not appear to be unstable in this regards She did have a cardiac echo done back in January 2009 which showed normal left ventricular ejection fraction of 55-65%--Doppler parameters were consistent with abnormal left ventricular relaxation  I did discuss the labs with her daughter-we did discuss options --bottom line was her daughter doesn't wish aggressive intervention and minimal invasive procedures-if we were going to treat the edema with diuretics certainly would require fairly frequent blood draws and her daughter does not want that--- wishes again just conservative follow-up and monitoring with emphasis on comfort      Past Medical History:  Diagnosis Date  . Cancer ([place])   . Dementia   . Gallstones   . Gastroparalysis   . HTN (hypertension)   . Infiltrating ductal carcinoma of right female breast ([place]) 12/02/2009   Qualifier: History of  By: [name] CMA [name]), June    . Osteoporosis 11/18/2011  . Reflux   . Renal disorder    renal insufficiency.    Past Surgical History:  Procedure Laterality Date  . BREAST BIOPSY    . BREAST LUMPECTOMY    . COLONOSCOPY  2009   Dr. Fuller Plan: normal  . ESOPHAGOGASTRODUODENOSCOPY  12/11/2009   Dr. Fuller Plan: hiatal hernia  . ESOPHAGOGASTRODUODENOSCOPY N/A 01/29/2014   Procedure: ESOPHAGOGASTRODUODENOSCOPY (EGD);  Surgeon: [name], MD;  [facility];  Service: Endoscopy;  Laterality: N/A;  245  . TONSILLECTOMY AND ADENOIDECTOMY      Allergies  Allergen Reactions  . Aricept [Donepezil Hydrochloride] Nausea And Vomiting  . Exelon [Rivastigmine Tartrate]   . Lithium Nausea And Vomiting    Outpatient Encounter Prescriptions as of 10/04/2016  Medication Sig  . acetaminophen (TYLENOL) 325 MG tablet Take one tablet by mouth at bedtime, take 650 mg by mouth  every 4 hours prn  . amLODipine (NORVASC) 5 MG tablet Take 5 mg by mouth daily.  [name] docusate (COLACE) 50 MG/5ML liquid Take 100 mg by mouth daily.  . magnesium hydroxide (MILK OF MAGNESIA) 400 MG/5ML suspension Take 30 mLs by mouth daily as needed for mild constipation.  . metoCLOPramide (REGLAN) 5 MG tablet Take 5 mg by mouth 2 (two) times daily.  [name] omeprazole (PRILOSEC) 20 MG capsule Take 20 mg by mouth daily.  . ondansetron (ZOFRAN-ODT) 4 MG disintegrating tablet Dissolve 1 tablet in mouth 4 times daily before meals and at bedtime for nausea  . polyethylene glycol (MIRALAX / GLYCOLAX) packet Take 17 g by mouth daily.    . potassium chloride 20 MEQ/15ML (10%) SOLN Take 40 mEq by mouth daily.   [name] senna-docusate (SENOKOT-S) 8.6-50 MG tablet Take 2 tablets by mouth at bedtime.    No facility-administered encounter medications on file as of 10/04/2016.     Review of Systems   Essentially unattainable secondary to dementia  Immunization History  Administered Date(s) Administered  . Influenza-Unspecified 02/13/2014, 02/09/2016   Pertinent  Health Maintenance Due  Topic Date Due  . PNA vac Low Risk Adult (1 of 2 - PCV13) 10/05/2016 (Originally 01/13/2003)  . INFLUENZA VACCINE  12/07/2016  . DEXA SCAN  Completed   No flowsheet data found. Functional Status Survey:    Vitals:   10/04/16 1502  BP: 118/75  Pulse: 60  Resp: (!) 21  Temp: 99.2 F (37.3 C)  TempSrc: Oral    Physical Exam  In general this is a frail elderly female in no distress sitting comfortably in her wheelchair.  Her skin is warm and dry.  Oropharynx is clear mucous membranes moist.  Chest is clear to auscultation with poor respiratory effort there is no labored breathing.  Heart is regular rate and rhythm without murmur gallop or rub she has very minimal pedal edema today this appears actually improved evening compared earlier in the week.  Abdomen is soft nontender with positive bowel  sounds.  Musculoskeletal continues to move all extremities 4 with general frailty upper extremity strength does appear to be preserved.  Neurologic she is alert but has significant dementia she is not agitated with exam.  Psych she is oriented to self only does not really follow verbal commands findings consistent with fairly significant dementia  Labs reviewed:  Recent Labs  11/27/15 0725  04/11/16 0700  06/02/16 0700  06/12/16 0546 06/15/16 0718 08/25/16 0700 10/04/16 0715  NA 141  < > 139  < > 143  < > 143 143 139 140  K 3.6  < > 3.9  < > 3.6  < > 3.6 3.9 3.5 4.0  CL 104  < > 103  < > 105  < > 108 105 102 105  CO2 28  < > 27  < > 28  < > 25 27 27 26   GLUCOSE 83  < > 89  < > 101*  < > 104* 92 88 91  BUN 39*  < > 40*  < > 37*  < >  36* 46* 48* 52*  CREATININE 1.86*  < > 1.50*  < > 1.53*  < > 1.69* 1.70* 1.99* 2.07*  CALCIUM 9.4  < > 9.4  < > 9.6  < > 9.1 9.3 9.5 9.5  MG 2.4  --  2.6*  --  2.6*  --   --   --   --   --   PHOS  --   --   --   --   --   --  2.3*  --   --   --   < > = values in this interval not displayed.  Recent Labs  05/25/16 0500 06/10/16 1300 06/10/16 1838 06/12/16 0546  AST 25 41 46*  --   ALT 19 41 51  --   ALKPHOS 70 77 75  --   BILITOT 0.7 0.5 0.4  --   PROT 7.3 8.4* 8.1  --   ALBUMIN 3.9 4.4 4.0 3.4*    Recent Labs  06/10/16 1300 06/10/16 1838 06/11/16 0550 06/15/16 0718 08/25/16 0700  WBC 9.3 9.3 8.5 9.4 5.2  NEUTROABS 8.3* 7.3  --   --  3.0  HGB 15.6* 14.2 14.3 13.1 12.6  HCT 49.8* 44.1 45.1 41.1 38.8  MCV 94.3 93.0 94.0 91.9 89.4  PLT 262 256 274 231 222   Lab Results  Component Value Date   TSH 0.749 06/10/2016   Lab Results  Component Value Date   HGBA1C  05/17/2007    5.4 (NOTE)   The ADA recommends the following therapeutic goals for glycemic   control related to Hgb A1C measurement:   Goal of Therapy:   < 7.0% Hgb A1C   Action Suggested:  > 8.0% Hgb A1C   Ref:  Diabetes Care, 22, Suppl. 1, 1999   Lab Results   Component Value Date   CHOL  05/22/2007    175        ATP III CLASSIFICATION:  <200     mg/dL   Desirable  200-239  mg/dL   Borderline High  >=240    mg/dL   High   HDL 37 (L) 05/22/2007   LDLCALC  05/22/2007    96        Total Cholesterol/HDL:CHD Risk Coronary Heart Disease Risk Table                     Men   Women  1/2 Average Risk   3.4   3.3   TRIG 208 (H) 05/22/2007   CHOLHDL 4.7 05/22/2007    Significant Diagnostic Results in last 30 days:  No results found.  Assessment/Plan  #1 history of elevated BNP-as noted above we did have a discussion with her daughter she does not really want aggressive interventions here diuretics requiring labs etc.-at this point will monitor clinically she appears stable in fact the edema appears improved today and is fairly minimal.  #2 renal insufficiency this appears to be relatively baseline Will monitor periodically but will not be  Aggressively  Pursuing  lab secondary to her daughter's wishes for essentially comfort measures. She is on potassium supplementation of note and potassium is normal at 4.0  [id number]--

## 2016-10-25 ENCOUNTER — Non-Acute Institutional Stay (SKILLED_NURSING_FACILITY): Payer: Medicare Other | Admitting: Internal Medicine

## 2016-10-25 ENCOUNTER — Encounter: Payer: Self-pay | Admitting: Internal Medicine

## 2016-10-25 DIAGNOSIS — F0281 Dementia in other diseases classified elsewhere with behavioral disturbance: Secondary | ICD-10-CM | POA: Diagnosis not present

## 2016-10-25 DIAGNOSIS — K3184 Gastroparesis: Secondary | ICD-10-CM

## 2016-10-25 DIAGNOSIS — G309 Alzheimer's disease, unspecified: Secondary | ICD-10-CM | POA: Diagnosis not present

## 2016-10-25 DIAGNOSIS — N289 Disorder of kidney and ureter, unspecified: Secondary | ICD-10-CM

## 2016-10-25 DIAGNOSIS — I10 Essential (primary) hypertension: Secondary | ICD-10-CM | POA: Diagnosis not present

## 2016-10-25 NOTE — Progress Notes (Signed)
Location:   [facility] Room Number: 110/W Place of Service:  SNF [phone]) Provider:  Kyung Rudd, Rene Kocher, MD  Patient Care Team: [name], MD as PCP - General (Internal Medicine) [name], [name], MD as Consulting Physician (Gastroenterology)  Extended Emergency Contact Information Primary Emergency Contact: [name] Address: [address]          [place], [place] [address] [name] of [place] Phone: [phone] Work Phone: [phone] Mobile Phone: [phone] Relation: Daughter Secondary Emergency Contact: [name] Address: [address]          [place], [place] [address] [name] of [place] Phone: [phone] Mobile Phone: [phone] Relation: Relative  Code Status:  DNR Goals of care: Advanced Directive information Advanced Directives 10/25/2016  Does Patient Have a Medical Advance Directive? Yes  Type of Advance Directive Out of facility DNR (pink MOST or yellow form)  Does patient want to make changes to medical advance directive? No - Patient declined  Copy of [place] in Chart? -  Would patient like information on creating a medical advance directive? No - Patient declined  Pre-existing out of facility DNR order (yellow form or pink MOST form) -     Chief Complaint  Patient presents with  . Medical Management of Chronic Issues    Routine Visit    HPI:  Pt is a 81 y.o. female seen today for medical management of chronic diseases.    Patient has h/o hypertension, Advanced Dementia, Chronic renal disease, Breast cancer s/p Mastectomy, GERD and Gastroparesis.  D/W Her Nurse patient is doing well in facility. As per wishes of her daughter she is comfort care. She did have high BNP few weeks ago done due to her swelling but Daughter choose not to use any diuresis as that would require more frequent Blood draws. Her appetite is still not good. Her weight is stable at 136 lbs. No  episodes of vomiting. She continues to be on Reglan, Protonix and Zofran.  Patient unable to give me any history due to her dementia.  Past Medical History:  Diagnosis Date  . Cancer ([facility])   . Dementia   . Gallstones   . Gastroparalysis   . HTN (hypertension)   . Infiltrating ductal carcinoma of right female breast ([facility]) 12/02/2009   Qualifier: History of  By: [name] CMA [name]), June    . Osteoporosis 11/18/2011  . Reflux   . Renal disorder    renal insufficiency.    Past Surgical History:  Procedure Laterality Date  . BREAST BIOPSY    . BREAST LUMPECTOMY    . COLONOSCOPY  2009   Dr. Fuller Plan: normal  . ESOPHAGOGASTRODUODENOSCOPY  12/11/2009   Dr. Fuller Plan: hiatal hernia  . ESOPHAGOGASTRODUODENOSCOPY N/A 01/29/2014   Procedure: ESOPHAGOGASTRODUODENOSCOPY (EGD);  Surgeon: [name], MD;  [facility];  Service: Endoscopy;  Laterality: N/A;  245  . TONSILLECTOMY AND ADENOIDECTOMY      Allergies  Allergen Reactions  . Aricept [Donepezil Hydrochloride] Nausea And Vomiting  . Exelon [Rivastigmine Tartrate]   . Lithium Nausea And Vomiting    Outpatient Encounter Prescriptions as of 10/25/2016  Medication Sig  . acetaminophen (TYLENOL) 325 MG tablet Take one tablet by mouth at bedtime, take 650 mg by mouth every 4 hours prn  . amLODipine (NORVASC) 5 MG tablet Take 5 mg by mouth daily.  [name] docusate (COLACE) 50 MG/5ML liquid Take 100 mg by mouth daily.  . magnesium hydroxide (MILK  OF MAGNESIA) 400 MG/5ML suspension Take 30 mLs by mouth daily as needed for mild constipation.  . metoCLOPramide (REGLAN) 5 MG tablet Take 5 mg by mouth 2 (two) times daily.  [name] omeprazole (PRILOSEC) 20 MG capsule Take 20 mg by mouth daily.  . ondansetron (ZOFRAN-ODT) 4 MG disintegrating tablet Dissolve 1 tablet in mouth 4 times daily before meals and at bedtime for nausea  . polyethylene glycol (MIRALAX / GLYCOLAX) packet Take 17 g by mouth daily.    . potassium chloride 20 MEQ/15ML (10%) SOLN  Take 40 mEq by mouth daily.   [name] senna-docusate (SENOKOT-S) 8.6-50 MG tablet Take 2 tablets by mouth at bedtime.    No facility-administered encounter medications on file as of 10/25/2016.     Review of Systems  Unable to perform ROS: Dementia    Immunization History  Administered Date(s) Administered  . Influenza-Unspecified 02/13/2014, 02/09/2016   Pertinent  Health Maintenance Due  Topic Date Due  . PNA vac Low Risk Adult (1 of 2 - PCV13) 01/13/2003  . INFLUENZA VACCINE  12/07/2016  . DEXA SCAN  Completed   No flowsheet data found. Functional Status Survey:    Vitals:   10/25/16 0935  BP: (!) 143/85  Pulse: 72  Resp: 20  Temp: 97.4 F (36.3 C)  TempSrc: Oral  Weight: 136 lb 6.4 oz (61.9 kg)  Height: 5\' 7"  (1.702 m)   Body mass index is 21.36 kg/m. Physical Exam Physical Exam  Constitutional: She appears well-developed and well-nourished. Sitting comfortably in Her wheel chair. HENT:  Head: Normocephalic.  Mouth/Throat: Oropharynx is clear and moist.  Eyes: Pupils are equal, round, and reactive to light.  Neck: Neck supple.  Cardiovascular: Normal rate, regular rhythm and normal heart sounds.   No murmur heard. Pulmonary/Chest: Effort normal and breath sounds normal. No respiratory distress. She has no wheezes. She has no rales.  Abdominal: Soft. Bowel sounds are normal. She exhibits no distension. There is no tenderness.  Musculoskeletal: She exhibits trace edema.  Neurological: She is alert.  Not oriented and does not follow any commands.  Skin: Skin is warm and dry.  Psychiatric: She has a normal mood and affect.   Labs reviewed:  Recent Labs  11/27/15 0725  04/11/16 0700  06/02/16 0700  06/12/16 0546 06/15/16 0718 08/25/16 0700 10/04/16 0715  NA 141  < > 139  < > 143  < > 143 143 139 140  K 3.6  < > 3.9  < > 3.6  < > 3.6 3.9 3.5 4.0  CL 104  < > 103  < > 105  < > 108 105 102 105  CO2 28  < > 27  < > 28  < > 25 27 27 26   GLUCOSE 83  < > 89  <  > 101*  < > 104* 92 88 91  BUN 39*  < > 40*  < > 37*  < > 36* 46* 48* 52*  CREATININE 1.86*  < > 1.50*  < > 1.53*  < > 1.69* 1.70* 1.99* 2.07*  CALCIUM 9.4  < > 9.4  < > 9.6  < > 9.1 9.3 9.5 9.5  MG 2.4  --  2.6*  --  2.6*  --   --   --   --   --   PHOS  --   --   --   --   --   --  2.3*  --   --   --   < > =  values in this interval not displayed.  Recent Labs  05/25/16 0500 06/10/16 1300 06/10/16 1838 06/12/16 0546  AST 25 41 46*  --   ALT 19 41 51  --   ALKPHOS 70 77 75  --   BILITOT 0.7 0.5 0.4  --   PROT 7.3 8.4* 8.1  --   ALBUMIN 3.9 4.4 4.0 3.4*    Recent Labs  06/10/16 1300 06/10/16 1838 06/11/16 0550 06/15/16 0718 08/25/16 0700  WBC 9.3 9.3 8.5 9.4 5.2  NEUTROABS 8.3* 7.3  --   --  3.0  HGB 15.6* 14.2 14.3 13.1 12.6  HCT 49.8* 44.1 45.1 41.1 38.8  MCV 94.3 93.0 94.0 91.9 89.4  PLT 262 256 274 231 222   Lab Results  Component Value Date   TSH 0.749 06/10/2016   Lab Results  Component Value Date   HGBA1C  05/17/2007    5.4 (NOTE)   The ADA recommends the following therapeutic goals for glycemic   control related to Hgb A1C measurement:   Goal of Therapy:   < 7.0% Hgb A1C   Action Suggested:  > 8.0% Hgb A1C   Ref:  Diabetes Care, 22, Suppl. 1, 1999   Lab Results  Component Value Date   CHOL  05/22/2007    175        ATP III CLASSIFICATION:  <200     mg/dL   Desirable  200-239  mg/dL   Borderline High  >=240    mg/dL   High   HDL 37 (L) 05/22/2007   LDLCALC  05/22/2007    96        Total Cholesterol/HDL:CHD Risk Coronary Heart Disease Risk Table                     Men   Women  1/2 Average Risk   3.4   3.3   TRIG 208 (H) 05/22/2007   CHOLHDL 4.7 05/22/2007    Significant Diagnostic Results in last 30 days:  No results found.  Assessment/Plan  Worsening Renal Function Patient Renal function has worsen . She looks comfortable not in any distress. Will continue supportive care. As daughter has decided not to use Lasix. Will only use if needed  for comfort. My Exam she does not look volume overload. Gastroparesis Patient is on Reglan No Dyskinesis noticed . Her weight is staying stable.  Gastroesophageal reflux disease  Continue on Omeprazole  Essential hypertension Stable on Norvasc.  Alzheimer's dementia Stable Continue Supportive care. MOST Form in the chart   Family/ staff Communication:  Labs/tests ordered:     Total time spent in this patient care encounter was 25_ minutes; greater than 50% of the visit spent coordinating care for problems addressed at this encounter.

## 2016-11-21 ENCOUNTER — Encounter: Payer: Self-pay | Admitting: Internal Medicine

## 2016-11-21 ENCOUNTER — Non-Acute Institutional Stay (SKILLED_NURSING_FACILITY): Payer: Medicare Other | Admitting: Internal Medicine

## 2016-11-21 DIAGNOSIS — K219 Gastro-esophageal reflux disease without esophagitis: Secondary | ICD-10-CM | POA: Diagnosis not present

## 2016-11-21 DIAGNOSIS — N289 Disorder of kidney and ureter, unspecified: Secondary | ICD-10-CM

## 2016-11-21 DIAGNOSIS — I10 Essential (primary) hypertension: Secondary | ICD-10-CM | POA: Diagnosis not present

## 2016-11-21 DIAGNOSIS — F0281 Dementia in other diseases classified elsewhere with behavioral disturbance: Secondary | ICD-10-CM

## 2016-11-21 DIAGNOSIS — G309 Alzheimer's disease, unspecified: Secondary | ICD-10-CM

## 2016-11-21 DIAGNOSIS — K3184 Gastroparesis: Secondary | ICD-10-CM | POA: Diagnosis not present

## 2016-11-21 NOTE — Progress Notes (Signed)
Location:   [facility] Room Number: 110/W Place of Service:  SNF [phone]) Provider:  Gardiner Fanti, Rene Kocher, MD  Patient Care Team: [name], MD as PCP - General (Internal Medicine) [name], [name], MD as Consulting Physician (Gastroenterology)  Extended Emergency Contact Information Primary Emergency Contact: [name] Address: [address]          [place], [place] [address] [name] of [place] Phone: [phone] Work Phone: [phone] Mobile Phone: [phone] Relation: Daughter Secondary Emergency Contact: [name] Address: [address]          [place], [place] [address] [name] of [place] Phone: [phone] Mobile Phone: [phone] Relation: Relative  Code Status:  DNR Goals of care: Advanced Directive information Advanced Directives 11/21/2016  Does Patient Have a Medical Advance Directive? Yes  Type of Advance Directive Out of facility DNR (pink MOST or yellow form)  Does patient want to make changes to medical advance directive? No - Patient declined  Copy of [place] in Chart? -  Would patient like information on creating a medical advance directive? No - Patient declined  Pre-existing out of facility DNR order (yellow form or pink MOST form) -     Chief Complaint  Patient presents with  . Medical Management of Chronic Issues    Routine Visit  For medical management of chronic medical issues including dementia-hypertension-chronic kidney disease-history of breast cancer status post mastectomy-GERD-gastroparesis  HPI:  Pt is a 81 y.o. female seen today for medical management of chronic diseases.  As noted above-she does have significant dementia and is largely under [place] a most form has been completed.  She appears to be doing relatively well with supportive care-her weight is stable she has a somewhat poor appetite however.  This is complicated with a history of  gastroparesis I 1. and had significant recurrent vomiting but this appears to have improved she is now on Reglan and Zofran routinely.  In regards to constipation she is on numerous agents including Colace MiraLAX and Senokot.  She does have a history of renal insufficiency creatinine most recently 2.07 BUN of 52 appear to be slightly above her baseline-she also is on potassium with a history of hypokalemia-efforts have been made to minimize blood draws secondary to Comfort Care status but I did discuss this with her daughter today in we will update a BMP for updated values.  She also had an elevated BNP of 625 back in May when she had some increased ankle edema of the ankle edema appears to be improved-at that point her daughter did not really want aggressive diuretic therapy nonetheless this appears to have stabilized.  Currently she is sitting in her wheelchair comfortably vital signs are stable weight appears to be stable at 137 pounds Past Medical History:  Diagnosis Date  . Cancer ([facility])   . Dementia   . Gallstones   . Gastroparalysis   . HTN (hypertension)   . Infiltrating ductal carcinoma of right female breast ([facility]) 12/02/2009   Qualifier: History of  By: [name] CMA [name]), June    . Osteoporosis 11/18/2011  . Reflux   . Renal disorder    renal insufficiency.    Past Surgical History:  Procedure Laterality Date  . BREAST BIOPSY    . BREAST LUMPECTOMY    . COLONOSCOPY  2009   Dr. Fuller Plan: normal  . ESOPHAGOGASTRODUODENOSCOPY  12/11/2009   Dr. Fuller Plan: hiatal hernia  . ESOPHAGOGASTRODUODENOSCOPY N/A 01/29/2014  Procedure: ESOPHAGOGASTRODUODENOSCOPY (EGD);  Surgeon: [name], MD;  [facility];  Service: Endoscopy;  Laterality: N/A;  245  . TONSILLECTOMY AND ADENOIDECTOMY      Allergies  Allergen Reactions  . Aricept [Donepezil Hydrochloride] Nausea And Vomiting  . Exelon [Rivastigmine Tartrate]   . Lithium Nausea And Vomiting    Outpatient Encounter  Prescriptions as of 11/21/2016  Medication Sig  . acetaminophen (TYLENOL) 325 MG tablet Take one tablet by mouth at bedtime, take 650 mg by mouth every 4 hours prn  . amLODipine (NORVASC) 5 MG tablet Take 5 mg by mouth daily.  [name] docusate (COLACE) 50 MG/5ML liquid Take 100 mg by mouth daily.  . metoCLOPramide (REGLAN) 5 MG tablet Take 5 mg by mouth 2 (two) times daily.  [name] omeprazole (PRILOSEC) 20 MG capsule Take 20 mg by mouth daily.  . ondansetron (ZOFRAN-ODT) 4 MG disintegrating tablet Dissolve 1 tablet in mouth 4 times daily before meals and at bedtime for nausea  . polyethylene glycol (MIRALAX / GLYCOLAX) packet Take 17 g by mouth daily.    . potassium chloride 20 MEQ/15ML (10%) SOLN Take 40 mEq by mouth daily.   [name] senna-docusate (SENOKOT-S) 8.6-50 MG tablet Take 2 tablets by mouth at bedtime.   . [DISCONTINUED] magnesium hydroxide (MILK OF MAGNESIA) 400 MG/5ML suspension Take 30 mLs by mouth daily as needed for mild constipation.   No facility-administered encounter medications on file as of 11/21/2016.      Review of Systems   Essentially unattainable secondary to dementia please see history of present illness  Immunization History  Administered Date(s) Administered  . Influenza-Unspecified 02/13/2014, 02/09/2016   Pertinent  Health Maintenance Due  Topic Date Due  . PNA vac Low Risk Adult (1 of 2 - PCV13) 01/13/2003  . INFLUENZA VACCINE  12/07/2016  . DEXA SCAN  Completed   No flowsheet data found. Functional Status Survey:    Temperature is 98.3 pulse 56 respirations 18 blood pressure 113/72 weight appears relatively stable at 137.2 pounds  Physical Exam In general this is a frail elderly female in no distress sitting comfortably in her wheelchair.  Her skin is warm and dry.  Oropharynx is clear mucous membranes moist.  Chest is clear to auscultation with poor respiratory effort there is no labored breathing.  Heart is regular rate and rhythm slightly  bradycardic without murmur gallop or rub she has very minimal pedal edema .  Abdomen is soft nontender with positive bowel sounds.  Musculoskeletal continues to move all extremities 4 with general frailty upper extremity strength does appear to be preserved.  Neurologic she is alert but has significant dementia she is not agitated with exam.  Psych she is oriented to self only does not really follow verbal commands findings consistent with fairly significant dementia  Labs reviewed:  Recent Labs  11/27/15 0725  04/11/16 0700  06/02/16 0700  06/12/16 0546 06/15/16 0718 08/25/16 0700 10/04/16 0715  NA 141  < > 139  < > 143  < > 143 143 139 140  K 3.6  < > 3.9  < > 3.6  < > 3.6 3.9 3.5 4.0  CL 104  < > 103  < > 105  < > 108 105 102 105  CO2 28  < > 27  < > 28  < > 25 27 27 26   GLUCOSE 83  < > 89  < > 101*  < > 104* 92 88 91  BUN 39*  < >  40*  < > 37*  < > 36* 46* 48* 52*  CREATININE 1.86*  < > 1.50*  < > 1.53*  < > 1.69* 1.70* 1.99* 2.07*  CALCIUM 9.4  < > 9.4  < > 9.6  < > 9.1 9.3 9.5 9.5  MG 2.4  --  2.6*  --  2.6*  --   --   --   --   --   PHOS  --   --   --   --   --   --  2.3*  --   --   --   < > = values in this interval not displayed.  Recent Labs  05/25/16 0500 06/10/16 1300 06/10/16 1838 06/12/16 0546  AST 25 41 46*  --   ALT 19 41 51  --   ALKPHOS 70 77 75  --   BILITOT 0.7 0.5 0.4  --   PROT 7.3 8.4* 8.1  --   ALBUMIN 3.9 4.4 4.0 3.4*    Recent Labs  06/10/16 1300 06/10/16 1838 06/11/16 0550 06/15/16 0718 08/25/16 0700  WBC 9.3 9.3 8.5 9.4 5.2  NEUTROABS 8.3* 7.3  --   --  3.0  HGB 15.6* 14.2 14.3 13.1 12.6  HCT 49.8* 44.1 45.1 41.1 38.8  MCV 94.3 93.0 94.0 91.9 89.4  PLT 262 256 274 231 222   Lab Results  Component Value Date   TSH 0.749 06/10/2016   Lab Results  Component Value Date   HGBA1C  05/17/2007    5.4 (NOTE)   The ADA recommends the following therapeutic goals for glycemic   control related to Hgb A1C measurement:   Goal  of Therapy:   < 7.0% Hgb A1C   Action Suggested:  > 8.0% Hgb A1C   Ref:  Diabetes Care, 22, Suppl. 1, 1999   Lab Results  Component Value Date   CHOL  05/22/2007    175        ATP III CLASSIFICATION:  <200     mg/dL   Desirable  200-239  mg/dL   Borderline High  >=240    mg/dL   High   HDL 37 (L) 05/22/2007   LDLCALC  05/22/2007    96        Total Cholesterol/HDL:CHD Risk Coronary Heart Disease Risk Table                     Men   Women  1/2 Average Risk   3.4   3.3   TRIG 208 (H) 05/22/2007   CHOLHDL 4.7 05/22/2007    Significant Diagnostic Results in last 30 days:  No results found.  Assessment/Plan  #1 dementia-she does have a MOST form in place with emphasis on comfort weight appears actually somewhat stabilized at this point continue supportive care.  #2-history of gastroparesis this has been an issue in the past but appears to have stabilized her nausea and vomiting appears to have largely resolved she is on Reglan and Zofran routinely--.  Note she is also on a PPI with history of GERD.  #3 history of chronic kidney disease creatinine as noted above slightly above her baseline at 2.07 on most recent lab in May I discussed this with her daughter and will update a metabolic panel also because she is on potassium to keep an eye on this.  #4-hypertension she is on Norvasc this appears stable continue to monitor.  #5-history of high BNP-as noted above clinically appears to be  stable daughter at time lab was drawn did not really want aggressive treatment diuretics-nonetheless she appears to be stable in this regards.  #6-history of breast carcinoma status post mastectomy at one point had been on tamoxifen but this was discontinued secondary to comfort care in desires to minimize medications.  Again will update a BMP to keep eye  on her renal function and electrolytes and she is on potassium.  [id number]

## 2016-11-22 ENCOUNTER — Encounter (HOSPITAL_COMMUNITY)
Admission: RE | Admit: 2016-11-22 | Discharge: 2016-11-22 | Disposition: A | Discharge status: Home / Self Care | Payer: Medicare Other | Source: Skilled Nursing Facility | Attending: Internal Medicine | Admitting: Internal Medicine

## 2016-11-22 DIAGNOSIS — Z853 Personal history of malignant neoplasm of breast: Secondary | ICD-10-CM | POA: Insufficient documentation

## 2016-11-22 DIAGNOSIS — R1311 Dysphagia, oral phase: Secondary | ICD-10-CM | POA: Diagnosis not present

## 2016-11-22 DIAGNOSIS — F0391 Unspecified dementia with behavioral disturbance: Secondary | ICD-10-CM | POA: Diagnosis not present

## 2016-11-22 DIAGNOSIS — R112 Nausea with vomiting, unspecified: Secondary | ICD-10-CM | POA: Diagnosis not present

## 2016-11-22 DIAGNOSIS — F319 Bipolar disorder, unspecified: Secondary | ICD-10-CM | POA: Diagnosis not present

## 2016-11-22 DIAGNOSIS — Z9181 History of falling: Secondary | ICD-10-CM | POA: Insufficient documentation

## 2016-11-22 LAB — BASIC METABOLIC PANEL
Anion gap: 10 (ref 5–15)
BUN: 48 mg/dL — ABNORMAL HIGH (ref 6–20)
CHLORIDE: 103 mmol/L (ref 101–111)
CO2: 28 mmol/L (ref 22–32)
CREATININE: 1.98 mg/dL — AB (ref 0.44–1.00)
Calcium: 9.6 mg/dL (ref 8.9–10.3)
GFR calc non Af Amer: 23 mL/min — ABNORMAL LOW (ref >60)
GFR, EST AFRICAN AMERICAN: 27 mL/min — AB (ref >60)
GLUCOSE: 84 mg/dL (ref 65–99)
Potassium: 4 mmol/L (ref 3.5–5.1)
Sodium: 141 mmol/L (ref 135–145)

## 2016-12-20 ENCOUNTER — Encounter: Payer: Self-pay | Admitting: Internal Medicine

## 2016-12-20 ENCOUNTER — Non-Acute Institutional Stay (SKILLED_NURSING_FACILITY): Payer: Medicare Other | Admitting: Internal Medicine

## 2016-12-20 DIAGNOSIS — K219 Gastro-esophageal reflux disease without esophagitis: Secondary | ICD-10-CM | POA: Diagnosis not present

## 2016-12-20 DIAGNOSIS — H1011 Acute atopic conjunctivitis, right eye: Secondary | ICD-10-CM | POA: Diagnosis not present

## 2016-12-20 DIAGNOSIS — I10 Essential (primary) hypertension: Secondary | ICD-10-CM | POA: Diagnosis not present

## 2016-12-20 DIAGNOSIS — K3184 Gastroparesis: Secondary | ICD-10-CM | POA: Diagnosis not present

## 2016-12-20 DIAGNOSIS — R1314 Dysphagia, pharyngoesophageal phase: Secondary | ICD-10-CM | POA: Diagnosis not present

## 2016-12-20 NOTE — Progress Notes (Signed)
Location:   [facility] Room Number: 110/W Place of Service:  SNF [id number]) Provider:  Kyung Rudd, Rene Kocher, MD  Patient Care Team: [name], MD as PCP - General (Internal Medicine) [name], [name], MD as Consulting Physician (Gastroenterology)  Extended Emergency Contact Information Primary Emergency Contact: [name] Address: [address]          [place], [place] [address] [name] of [place] Phone: [phone] Work Phone: [phone] Mobile Phone: [phone] Relation: Daughter Secondary Emergency Contact: [name] Address: [address]          [place],  [address] [name] of [place] Phone: [phone] Mobile Phone: [phone] Relation: Relative  Code Status:  DNR Goals of care: Advanced Directive information Advanced Directives 12/20/2016  Does Patient Have a Medical Advance Directive? Yes  Type of Advance Directive Out of facility DNR (pink MOST or yellow form)  Does patient want to make changes to medical advance directive? No - Patient declined  Copy of [place] in Chart? -  Would patient like information on creating a medical advance directive? No - Patient declined  Pre-existing out of facility DNR order (yellow form or pink MOST form) -     Chief Complaint  Patient presents with  . Acute Visit    Right Eye Red and Itchy    HPI:  Pt is a 81 y.o. female seen today for an acute visit for Right Eye  itching.  Patient has h/o hypertension, Advanced Dementia, Chronic renal disease, Breast cancer s/p Mastectomy, GERD and Gastroparesis Patient daughter noticed that her mom was rubbing her right eye really hard like she is itching.  Patient has severe Dementia so is unable to give me any history. Nurses say that they have not noticed any discharge or redness. Patient has had no other Nursing issues otherwise.  Past Medical History:  Diagnosis Date  . Cancer ([facility])   .  Dementia   . Gallstones   . Gastroparalysis   . HTN (hypertension)   . Infiltrating ductal carcinoma of right female breast ([facility]) 12/02/2009   Qualifier: History of  By: [name] CMA [name]), June    . Osteoporosis 11/18/2011  . Reflux   . Renal disorder    renal insufficiency.    Past Surgical History:  Procedure Laterality Date  . BREAST BIOPSY    . BREAST LUMPECTOMY    . COLONOSCOPY  2009   Dr. Fuller Plan: normal  . ESOPHAGOGASTRODUODENOSCOPY  12/11/2009   Dr. Fuller Plan: hiatal hernia  . ESOPHAGOGASTRODUODENOSCOPY N/A 01/29/2014   Procedure: ESOPHAGOGASTRODUODENOSCOPY (EGD);  Surgeon: [name], MD;  [facility];  Service: Endoscopy;  Laterality: N/A;  245  . TONSILLECTOMY AND ADENOIDECTOMY      Allergies  Allergen Reactions  . Aricept [Donepezil Hydrochloride] Nausea And Vomiting  . Exelon [Rivastigmine Tartrate]   . Lithium Nausea And Vomiting    Outpatient Encounter Prescriptions as of 12/20/2016  Medication Sig  . acetaminophen (TYLENOL) 325 MG tablet Take one tablet by mouth at bedtime, take 650 mg by mouth every 4 hours prn  . amLODipine (NORVASC) 5 MG tablet Take 5 mg by mouth daily.  [name] docusate (COLACE) 50 MG/5ML liquid Take 100 mg by mouth daily.  . metoCLOPramide (REGLAN) 5 MG tablet Take 5 mg by mouth 2 (two) times daily.  [name] omeprazole (PRILOSEC) 20 MG capsule Take 20 mg by mouth daily.  . ondansetron (ZOFRAN-ODT) 4 MG disintegrating tablet Dissolve 1 tablet in mouth  4 times daily before meals and at bedtime for nausea  . polyethylene glycol (MIRALAX / GLYCOLAX) packet Take 17 g by mouth daily.    . potassium chloride 20 MEQ/15ML (10%) SOLN Take 40 mEq by mouth daily.   [name] senna-docusate (SENOKOT-S) 8.6-50 MG tablet Take 2 tablets by mouth at bedtime.    No facility-administered encounter medications on file as of 12/20/2016.      Review of Systems  Unable to perform ROS: Dementia    Immunization History  Administered Date(s) Administered  .  Influenza-Unspecified 02/13/2014, 02/09/2016   Pertinent  Health Maintenance Due  Topic Date Due  . INFLUENZA VACCINE  04/08/2017 (Originally 12/07/2016)  . PNA vac Low Risk Adult (1 of 2 - PCV13) 04/08/2017 (Originally 01/13/2003)  . DEXA SCAN  Completed   No flowsheet data found. Functional Status Survey:    Vitals:   12/20/16 1332  BP: 113/72  Pulse: 60  Resp: 20  Temp: 98.9 F (37.2 C)  TempSrc: Oral   There is no height or weight on file to calculate BMI. Physical Exam  Constitutional: She appears well-developed.  HENT:  Head: Normocephalic.  Mouth/Throat: Oropharynx is clear and moist.  Eyes:  Some redness around the skin around the right eye. But her sclera and Conjunctiva have no redness or discharge.  Cardiovascular: Normal rate and normal heart sounds.   Pulmonary/Chest: Effort normal and breath sounds normal. No respiratory distress. She has no wheezes.  Abdominal: Soft. Bowel sounds are normal. She exhibits no distension. There is no tenderness.  Musculoskeletal: She exhibits no edema.    Labs reviewed:  Recent Labs  04/11/16 0700  06/02/16 0700  06/12/16 0546  08/25/16 0700 10/04/16 0715 11/22/16 0700  NA 139  < > 143  < > 143  < > 139 140 141  K 3.9  < > 3.6  < > 3.6  < > 3.5 4.0 4.0  CL 103  < > 105  < > 108  < > 102 105 103  CO2 27  < > 28  < > 25  < > 27 26 28   GLUCOSE 89  < > 101*  < > 104*  < > 88 91 84  BUN 40*  < > 37*  < > 36*  < > 48* 52* 48*  CREATININE 1.50*  < > 1.53*  < > 1.69*  < > 1.99* 2.07* 1.98*  CALCIUM 9.4  < > 9.6  < > 9.1  < > 9.5 9.5 9.6  MG 2.6*  --  2.6*  --   --   --   --   --   --   PHOS  --   --   --   --  2.3*  --   --   --   --   < > = values in this interval not displayed.  Recent Labs  05/25/16 0500 06/10/16 1300 06/10/16 1838 06/12/16 0546  AST 25 41 46*  --   ALT 19 41 51  --   ALKPHOS 70 77 75  --   BILITOT 0.7 0.5 0.4  --   PROT 7.3 8.4* 8.1  --   ALBUMIN 3.9 4.4 4.0 3.4*    Recent Labs   06/10/16 1300 06/10/16 1838 06/11/16 0550 06/15/16 0718 08/25/16 0700  WBC 9.3 9.3 8.5 9.4 5.2  NEUTROABS 8.3* 7.3  --   --  3.0  HGB 15.6* 14.2 14.3 13.1 12.6  HCT 49.8* 44.1 45.1 41.1 38.8  MCV 94.3 93.0 94.0 91.9 89.4  PLT 262 256 274 231 222   Lab Results  Component Value Date   TSH 0.749 06/10/2016   Lab Results  Component Value Date   HGBA1C  05/17/2007    5.4 (NOTE)   The ADA recommends the following therapeutic goals for glycemic   control related to Hgb A1C measurement:   Goal of Therapy:   < 7.0% Hgb A1C   Action Suggested:  > 8.0% Hgb A1C   Ref:  Diabetes Care, 22, Suppl. 1, 1999   Lab Results  Component Value Date   CHOL  05/22/2007    175        ATP III CLASSIFICATION:  <200     mg/dL   Desirable  200-239  mg/dL   Borderline High  >=240    mg/dL   High   HDL 37 (L) 05/22/2007   LDLCALC  05/22/2007    96        Total Cholesterol/HDL:CHD Risk Coronary Heart Disease Risk Table                     Men   Women  1/2 Average Risk   3.4   3.3   TRIG 208 (H) 05/22/2007   CHOLHDL 4.7 05/22/2007    Significant Diagnostic Results in last 30 days:  No results found.  Assessment/Plan  Possible Allergic Conjunctivitis. Do not think it is infectious. Will try some Antihistamine drops for few days and revaluate if it doesn't get better.  Gastroparesis Patient is on Reglan  Gastroesophageal reflux disease  Continue on Omeprazole  Essential hypertension Stable on Norvasc.  Alzheimer's dementia Stable Continue Supportive care. MOST Form in the chart    Family/ staff Communication:   Labs/tests ordered:   Total time spent in this patient care encounter was 25_ minutes; greater than 50% of the time spent, reviewing records , Labs and coordinating care for problems addressed at this encounter.

## 2016-12-26 ENCOUNTER — Non-Acute Institutional Stay (SKILLED_NURSING_FACILITY): Payer: Medicare Other | Admitting: Internal Medicine

## 2016-12-26 ENCOUNTER — Encounter: Payer: Self-pay | Admitting: Internal Medicine

## 2016-12-26 DIAGNOSIS — K3184 Gastroparesis: Secondary | ICD-10-CM | POA: Diagnosis not present

## 2016-12-26 DIAGNOSIS — R1314 Dysphagia, pharyngoesophageal phase: Secondary | ICD-10-CM | POA: Diagnosis not present

## 2016-12-26 DIAGNOSIS — G309 Alzheimer's disease, unspecified: Secondary | ICD-10-CM

## 2016-12-26 DIAGNOSIS — I10 Essential (primary) hypertension: Secondary | ICD-10-CM | POA: Diagnosis not present

## 2016-12-26 DIAGNOSIS — F0281 Dementia in other diseases classified elsewhere with behavioral disturbance: Secondary | ICD-10-CM | POA: Diagnosis not present

## 2016-12-26 NOTE — Progress Notes (Signed)
Location:   [facility] Room Number: 110/W Place of Service:  SNF [phone]) Provider:  Kyung Rudd, Rene Kocher, MD  Patient Care Team: [name], MD as PCP - General (Internal Medicine) [name], [name], MD as Consulting Physician (Gastroenterology)  Extended Emergency Contact Information Primary Emergency Contact: [name] Address: [address]          [place], [place] [address] [name] of [place] Phone: [phone] Work Phone: [phone] Mobile Phone: [phone] Relation: Daughter Secondary Emergency Contact: [name] Address: [address]          [place], [place] [address] [name] of [place] Phone: [phone] Mobile Phone: [phone] Relation: Relative  Code Status:  DNR Goals of care: Advanced Directive information Advanced Directives 12/26/2016  Does Patient Have a Medical Advance Directive? Yes  Type of Advance Directive Out of facility DNR (pink MOST or yellow form)  Does patient want to make changes to medical advance directive? No - Patient declined  Copy of [place] in Chart? -  Would patient like information on creating a medical advance directive? No - Patient declined  Pre-existing out of facility DNR order (yellow form or pink MOST form) -     Chief Complaint  Patient presents with  . Medical Management of Chronic Issues    Routine Visit    HPI:  Pt is a 81 y.o. female seen today for medical management of chronic diseases.    Patient has h/o hypertension, Advanced Dementia, Chronic renal disease, Breast cancer s/p Mastectomy, GERD and Gastroparesis.  Patient is Long term resident of facility and has been stable and doing well. She was seen by me 2 weeks ago for itching and red eyes and has responded well to Antihistamine eye drops. Her weight is up to 141 lbs. From 136 lbs in 06/18. Her appetite is still not that good but she is vomiting is more stable on Reglan, Zofran  and Prilosec. As per her daughter patient is comfort care and no blood work unless something changed clinically. Patient has no new Nursing issues.  Past Medical History:  Diagnosis Date  . Cancer ([place])   . Dementia   . Gallstones   . Gastroparalysis   . HTN (hypertension)   . Infiltrating ductal carcinoma of right female breast ([place]) 12/02/2009   Qualifier: History of  By: [name] CMA [name]), June    . Osteoporosis 11/18/2011  . Reflux   . Renal disorder    renal insufficiency.    Past Surgical History:  Procedure Laterality Date  . BREAST BIOPSY    . BREAST LUMPECTOMY    . COLONOSCOPY  2009   Dr. Fuller Plan: normal  . ESOPHAGOGASTRODUODENOSCOPY  12/11/2009   Dr. Fuller Plan: hiatal hernia  . ESOPHAGOGASTRODUODENOSCOPY N/A 01/29/2014   Procedure: ESOPHAGOGASTRODUODENOSCOPY (EGD);  Surgeon: [name], MD;  [facility]: [facility];  Service: Endoscopy;  Laterality: N/A;  245  . TONSILLECTOMY AND ADENOIDECTOMY      Allergies  Allergen Reactions  . Aricept [Donepezil Hydrochloride] Nausea And Vomiting  . Exelon [Rivastigmine Tartrate]   . Lithium Nausea And Vomiting    Outpatient Encounter Prescriptions as of 12/26/2016  Medication Sig  . acetaminophen (TYLENOL) 325 MG tablet Take one tablet by mouth at bedtime, take 650 mg by mouth every 4 hours prn  . Alcaftadine 0.25 % SOLN Apply 2 drops in right eye QD X 7 days once a day  . amLODipine (NORVASC) 5 MG tablet Take 5 mg  by mouth daily.  [name] docusate (COLACE) 50 MG/5ML liquid Take 100 mg by mouth daily.  . metoCLOPramide (REGLAN) 5 MG tablet Take 5 mg by mouth 2 (two) times daily.  [name] omeprazole (PRILOSEC) 20 MG capsule Take 20 mg by mouth daily.  . ondansetron (ZOFRAN-ODT) 4 MG disintegrating tablet Dissolve 1 tablet in mouth 4 times daily before meals and at bedtime for nausea  . polyethylene glycol (MIRALAX / GLYCOLAX) packet Take 17 g by mouth daily.    . potassium chloride 20 MEQ/15ML (10%) SOLN Take 40 mEq by mouth daily.   [name]  senna-docusate (SENOKOT-S) 8.6-50 MG tablet Take 2 tablets by mouth at bedtime.    No facility-administered encounter medications on file as of 12/26/2016.      Review of Systems  Unable to perform ROS: Dementia    Immunization History  Administered Date(s) Administered  . Influenza-Unspecified 02/13/2014, 02/09/2016   Pertinent  Health Maintenance Due  Topic Date Due  . INFLUENZA VACCINE  04/08/2017 (Originally 12/07/2016)  . PNA vac Low Risk Adult (1 of 2 - PCV13) 04/08/2017 (Originally 01/13/2003)  . DEXA SCAN  Completed   No flowsheet data found. Functional Status Survey:    Vitals:   12/26/16 1427  BP: 136/80  Pulse: 74  Resp: 20  Temp: (!) 97.4 F (36.3 C)  TempSrc: Oral  SpO2: 96%  Weight: 141 lb 3.2 oz (64 kg)  Height: 5\' 7"  (1.702 m)   Body mass index is 22.12 kg/m. Physical Exam  Constitutional: She appears well-developed.  HENT:  Head: Normocephalic.  Mouth/Throat: Oropharynx is clear and moist.  Eyes: Pupils are equal, round, and reactive to light.  Neck: Neck supple.  Cardiovascular: Normal rate and regular rhythm.   No murmur heard. Pulmonary/Chest: Effort normal and breath sounds normal.  Abdominal: Soft. Bowel sounds are normal. She exhibits no distension. There is no tenderness. There is no rebound.  Musculoskeletal:  Trace edema B/L  Neurological: She is alert.    Labs reviewed:  Recent Labs  04/11/16 0700  06/02/16 0700  06/12/16 0546  08/25/16 0700 10/04/16 0715 11/22/16 0700  NA 139  < > 143  < > 143  < > 139 140 141  K 3.9  < > 3.6  < > 3.6  < > 3.5 4.0 4.0  CL 103  < > 105  < > 108  < > 102 105 103  CO2 27  < > 28  < > 25  < > 27 26 28   GLUCOSE 89  < > 101*  < > 104*  < > 88 91 84  BUN 40*  < > 37*  < > 36*  < > 48* 52* 48*  CREATININE 1.50*  < > 1.53*  < > 1.69*  < > 1.99* 2.07* 1.98*  CALCIUM 9.4  < > 9.6  < > 9.1  < > 9.5 9.5 9.6  MG 2.6*  --  2.6*  --   --   --   --   --   --   PHOS  --   --   --   --  2.3*  --   --   --    --   < > = values in this interval not displayed.  Recent Labs  05/25/16 0500 06/10/16 1300 06/10/16 1838 06/12/16 0546  AST 25 41 46*  --   ALT 19 41 51  --   ALKPHOS 70 77 75  --   BILITOT 0.7 0.5  0.4  --   PROT 7.3 8.4* 8.1  --   ALBUMIN 3.9 4.4 4.0 3.4*    Recent Labs  06/10/16 1300 06/10/16 1838 06/11/16 0550 06/15/16 0718 08/25/16 0700  WBC 9.3 9.3 8.5 9.4 5.2  NEUTROABS 8.3* 7.3  --   --  3.0  HGB 15.6* 14.2 14.3 13.1 12.6  HCT 49.8* 44.1 45.1 41.1 38.8  MCV 94.3 93.0 94.0 91.9 89.4  PLT 262 256 274 231 222   Lab Results  Component Value Date   TSH 0.749 06/10/2016   Lab Results  Component Value Date   HGBA1C  05/17/2007    5.4 (NOTE)   The ADA recommends the following therapeutic goals for glycemic   control related to Hgb A1C measurement:   Goal of Therapy:   < 7.0% Hgb A1C   Action Suggested:  > 8.0% Hgb A1C   Ref:  Diabetes Care, 22, Suppl. 1, 1999   Lab Results  Component Value Date   CHOL  05/22/2007    175        ATP III CLASSIFICATION:  <200     mg/dL   Desirable  200-239  mg/dL   Borderline High  >=240    mg/dL   High   HDL 37 (L) 05/22/2007   LDLCALC  05/22/2007    96        Total Cholesterol/HDL:CHD Risk Coronary Heart Disease Risk Table                     Men   Women  1/2 Average Risk   3.4   3.3   TRIG 208 (H) 05/22/2007   CHOLHDL 4.7 05/22/2007    Significant Diagnostic Results in last 30 days:  No results found.  Assessment/Plan  Chronic renal insufficiency stage 4 Conitinue to follow clinically. Patient seems stable   Gastroparesis Patient is on Reglan No Dyskinesis noticed . Gastroesophageal reflux disease  Continue on Omeprazole  Essential hypertension Stable on Norvasc.  Alzheimer's dementia Stable Continue Supportive care. MOST Form in the chart  Weight gain most likely due to Improve appetite with reglan. Don't think clinically she is volume overloaded Continue to follow.  Family/ staff Communication:    Labs/tests ordered:    Total time spent in this patient care encounter was 25_ minutes; greater than 50% of the visit spent , reviewing records , Labs and coordinating care for problems addressed at this encounter.

## 2017-01-20 ENCOUNTER — Encounter: Payer: Self-pay | Admitting: Internal Medicine

## 2017-01-20 ENCOUNTER — Non-Acute Institutional Stay (SKILLED_NURSING_FACILITY): Payer: Medicare Other | Admitting: Internal Medicine

## 2017-01-20 DIAGNOSIS — I10 Essential (primary) hypertension: Secondary | ICD-10-CM

## 2017-01-20 DIAGNOSIS — N289 Disorder of kidney and ureter, unspecified: Secondary | ICD-10-CM

## 2017-01-20 DIAGNOSIS — K5900 Constipation, unspecified: Secondary | ICD-10-CM | POA: Diagnosis not present

## 2017-01-20 DIAGNOSIS — G309 Alzheimer's disease, unspecified: Secondary | ICD-10-CM | POA: Diagnosis not present

## 2017-01-20 DIAGNOSIS — F0281 Dementia in other diseases classified elsewhere with behavioral disturbance: Secondary | ICD-10-CM

## 2017-01-20 DIAGNOSIS — K3184 Gastroparesis: Secondary | ICD-10-CM

## 2017-01-20 NOTE — Progress Notes (Signed)
Location:   [facility] Room Number: 110/W Place of Service:  SNF [phone]) Provider:  Gardiner Fanti, Rene Kocher, MD  Patient Care Team: [name], MD as PCP - General (Internal Medicine) [name], [name], MD as Consulting Physician (Gastroenterology)  Extended Emergency Contact Information Primary Emergency Contact: [name] Address: [address]          [place], [place] [address] [name] of [place] Phone: [phone] Work Phone: [phone] Mobile Phone: [phone] Relation: Daughter Secondary Emergency Contact: [name] Address: [address]          [place], [place] [address] [name] of [place] Phone: [phone] Mobile Phone: [phone] Relation: Relative  Code Status:  DNR Goals of care: Advanced Directive information Advanced Directives 01/20/2017  Does Patient Have a Medical Advance Directive? Yes  Type of Advance Directive Out of facility DNR (pink MOST or yellow form)  Does patient want to make changes to medical advance directive? No - Patient declined  Copy of [place] in Chart? -  Would patient like information on creating a medical advance directive? No - Patient declined  Pre-existing out of facility DNR order (yellow form or pink MOST form) -     Chief Complaint  Patient presents with  . Medical Management of Chronic Issues    Routine Visit  Medical management of chronic medical issues including dementia-hypertension-constipation-chronic kidney disease-gastroparesis. As well as history of breast cancer status post mastectomy    HPI:  Pt is a 81 y.o. female seen today for medical management of chronic diseases.  As noted well.  Patient has a history of severe dementia and is under largely comfort care measures with NovoLog work unless something changes significantly clinically.  Most recent acute issue has been some vomiting with her history of gastroparesis but apparently  this has improved somewhat her weight actually is going up 145 pounds it was 141 pounds a month ago.  It was down to 136 pounds in June 2018.  She is on Reglan Zofran and Prilosec.  Her other issues appear to be stable she does have a history of hypertension on Norvasc her blood pressure lately recently 138/78-136/80.  She does have a history constipation is on numerous agents including Colace MiraLAX and senna this appears to be stable at this time.  She also has a history of chronic kidney disease creatinine of 1.98 appears relatively baseline this lab was done in July 2018.  She also has a history of hyperkalemia potassium on that lab was 4.0 she is on supplementation.  Regards to breast cancer she is status post mastectomy she is no longer on oral medication and this was discontinued secondary to wishes to minimize her medications.     Past Medical History:  Diagnosis Date  . Cancer ([place])   . Dementia   . Gallstones   . Gastroparalysis   . HTN (hypertension)   . Infiltrating ductal carcinoma of right female breast ([facility]) 12/02/2009   Qualifier: History of  By: [name] CMA [name]), June    . Osteoporosis 11/18/2011  . Reflux   . Renal disorder    renal insufficiency.    Past Surgical History:  Procedure Laterality Date  . BREAST BIOPSY    . BREAST LUMPECTOMY    . COLONOSCOPY  2009   Dr. Fuller Plan: normal  . ESOPHAGOGASTRODUODENOSCOPY  12/11/2009   Dr. Fuller Plan: hiatal hernia  . ESOPHAGOGASTRODUODENOSCOPY N/A 01/29/2014   Procedure: ESOPHAGOGASTRODUODENOSCOPY (EGD);  Surgeon: [name],  MD;  [facility];  Service: Endoscopy;  Laterality: N/A;  245  . TONSILLECTOMY AND ADENOIDECTOMY      Allergies  Allergen Reactions  . Aricept [Donepezil Hydrochloride] Nausea And Vomiting  . Exelon [Rivastigmine Tartrate]   . Lithium Nausea And Vomiting    Outpatient Encounter Prescriptions as of 01/20/2017  Medication Sig  . acetaminophen (TYLENOL) 325 MG tablet Take one  tablet by mouth at bedtime, take 650 mg by mouth every 4 hours prn  . amLODipine (NORVASC) 5 MG tablet Take 5 mg by mouth daily.  [name] docusate (COLACE) 50 MG/5ML liquid Take 100 mg by mouth daily.  . metoCLOPramide (REGLAN) 5 MG tablet Take 5 mg by mouth 2 (two) times daily.  [name] omeprazole (PRILOSEC) 20 MG capsule Take 20 mg by mouth daily.  . ondansetron (ZOFRAN-ODT) 4 MG disintegrating tablet Dissolve 1 tablet in mouth 4 times daily before meals and at bedtime for nausea  . polyethylene glycol (MIRALAX / GLYCOLAX) packet Take 17 g by mouth daily.    . potassium chloride 20 MEQ/15ML (10%) SOLN Take 40 mEq by mouth daily.   [name] senna-docusate (SENOKOT-S) 8.6-50 MG tablet Take 2 tablets by mouth at bedtime.   . [DISCONTINUED] Alcaftadine 0.25 % SOLN Apply 2 drops in right eye QD X 7 days once a day   No facility-administered encounter medications on file as of 01/20/2017.      Review of Systems   Not obtainable secondary to dementia please see history of present illness  Immunization History  Administered Date(s) Administered  . Influenza-Unspecified 02/13/2014, 02/09/2016   Pertinent  Health Maintenance Due  Topic Date Due  . INFLUENZA VACCINE  04/08/2017 (Originally 12/07/2016)  . PNA vac Low Risk Adult (1 of 2 - PCV13) 04/08/2017 (Originally 01/13/2003)  . DEXA SCAN  Completed   No flowsheet data found. Functional Status Survey:    Vitals:   01/20/17 1154  BP: 138/78  Pulse: 68  Resp: 18  Temp: 98.2 F (36.8 C)  TempSrc: Oral  SpO2: 95%  Weight: 145 lb (65.8 kg)  Height: 5\' 7"  (1.702 m)   Body mass index is 22.71 kg/m. Physical Exam   In general this is a somewhat frail elderly female in no distress sitting comfortably in her wheelchair.  Her skin is warm and dry.  Eyes pupils appear reactive light sclera and conjunctiva are clear visual acuity appears grossly intact.  Oropharynx is clear mucous membranes are moist she has numerous extractions.  Chest is clear to  auscultation with poor respiratory effort no labored breathing.  Heart is regular rate and rhythm with an occasional irregular beat she has minimal lower extremity edema bilaterally.  Abdomen soft nontender with positive bowel sounds.  Musculoskeletal does move all extremities 4 at baseline.  Neurologic is grossly intact no lateralizing findings she speaks fairly minimally appear she speaking less and less.  Psyche- findings consistent with significant dementia       Labs reviewed:  Recent Labs  04/11/16 0700  06/02/16 0700  06/12/16 0546  08/25/16 0700 10/04/16 0715 11/22/16 0700  NA 139  < > 143  < > 143  < > 139 140 141  K 3.9  < > 3.6  < > 3.6  < > 3.5 4.0 4.0  CL 103  < > 105  < > 108  < > 102 105 103  CO2 27  < > 28  < > 25  < > 27 26 28   GLUCOSE 89  < >  101*  < > 104*  < > 88 91 84  BUN 40*  < > 37*  < > 36*  < > 48* 52* 48*  CREATININE 1.50*  < > 1.53*  < > 1.69*  < > 1.99* 2.07* 1.98*  CALCIUM 9.4  < > 9.6  < > 9.1  < > 9.5 9.5 9.6  MG 2.6*  --  2.6*  --   --   --   --   --   --   PHOS  --   --   --   --  2.3*  --   --   --   --   < > = values in this interval not displayed.  Recent Labs  05/25/16 0500 06/10/16 1300 06/10/16 1838 06/12/16 0546  AST 25 41 46*  --   ALT 19 41 51  --   ALKPHOS 70 77 75  --   BILITOT 0.7 0.5 0.4  --   PROT 7.3 8.4* 8.1  --   ALBUMIN 3.9 4.4 4.0 3.4*    Recent Labs  06/10/16 1300 06/10/16 1838 06/11/16 0550 06/15/16 0718 08/25/16 0700  WBC 9.3 9.3 8.5 9.4 5.2  NEUTROABS 8.3* 7.3  --   --  3.0  HGB 15.6* 14.2 14.3 13.1 12.6  HCT 49.8* 44.1 45.1 41.1 38.8  MCV 94.3 93.0 94.0 91.9 89.4  PLT 262 256 274 231 222   Lab Results  Component Value Date   TSH 0.749 06/10/2016   Lab Results  Component Value Date   HGBA1C  05/17/2007    5.4 (NOTE)   The ADA recommends the following therapeutic goals for glycemic   control related to Hgb A1C measurement:   Goal of Therapy:   < 7.0% Hgb A1C   Action Suggested:  > 8.0%  Hgb A1C   Ref:  Diabetes Care, 22, Suppl. 1, 1999   Lab Results  Component Value Date   CHOL  05/22/2007    175        ATP III CLASSIFICATION:  <200     mg/dL   Desirable  200-239  mg/dL   Borderline High  >=240    mg/dL   High   HDL 37 (L) 05/22/2007   LDLCALC  05/22/2007    96        Total Cholesterol/HDL:CHD Risk Coronary Heart Disease Risk Table                     Men   Women  1/2 Average Risk   3.4   3.3   TRIG 208 (H) 05/22/2007   CHOLHDL 4.7 05/22/2007    Significant Diagnostic Results in last 30 days:  No results found.  Assessment/Plan  #1 dementia again this is significant she is doing well with supportive care actually has gained some weight which is encouraging at this point continue supportive care.  #2 history of gastroparesis /gerd--again she appears to be doing better now on the Reglan and Zofran as well as Prilosec she has gained weight vomiting episodes apparently occur occasionally but are less frequent.  #3 hypertension this appears stable as noted above on Norvasc.  #4 history of chronic kidney disease this appears relatively stable creatinine 1.98 on lab done in July again we are minimizing blood draws per emphasis on comfort care.  #5 history of breast carcinoma with mastectomy again oral agent was discontinued secondary to wishes to minimize medications per family.  #6 history constipation this appears  relatively stable on Colace MiraLAX and senna.  #7 history hypokalemia this is being supplemented last potassium was within normal range at 4.0 this is been stable for some time last level was done in [place] family wishes minimal blood draws.  [id number]

## 2017-01-23 ENCOUNTER — Non-Acute Institutional Stay (SKILLED_NURSING_FACILITY): Payer: Medicare Other | Admitting: Internal Medicine

## 2017-01-23 ENCOUNTER — Encounter: Payer: Self-pay | Admitting: Internal Medicine

## 2017-01-23 DIAGNOSIS — R112 Nausea with vomiting, unspecified: Secondary | ICD-10-CM | POA: Diagnosis not present

## 2017-01-23 DIAGNOSIS — K3184 Gastroparesis: Secondary | ICD-10-CM

## 2017-01-23 NOTE — Progress Notes (Signed)
Location:   [facility] Room Number: 110/W Place of Service:  SNF [phone]) Provider:  Freddi Starr, MD  Patient Care Team: [name], MD as PCP - General (Internal Medicine) [name], [name], MD as Consulting Physician (Gastroenterology)  Extended Emergency Contact Information Primary Emergency Contact: [name] Address: [address]          [place], [place] [address] [name] of [place] Phone: [phone] Work Phone: [phone] Mobile Phone: [phone] Relation: Daughter Secondary Emergency Contact: [name] Address: [address]          [place], [place] [address] [name] of [place] Phone: [phone] Mobile Phone: [phone] Relation: Relative  Code Status:  DNR Goals of care: Advanced Directive information Advanced Directives 01/23/2017  Does Patient Have a Medical Advance Directive? Yes  Type of Advance Directive Out of facility DNR (pink MOST or yellow form)  Does patient want to make changes to medical advance directive? No - Patient declined  Copy of [place] in Chart? -  Would patient like information on creating a medical advance directive? No - Patient declined  Pre-existing out of facility DNR order (yellow form or pink MOST form) -     Chief complaint-acute visit to assess need for Reglan per daughter's concern  HPI:  Pt is a 81 y.o. female seen today for an acute visit for follow-up of Reglan use.  . It is a long-term resident facility with a history gastroparesis vomiting-she has been on Reglan for some time is also on Zofran and Prilosec and this appears to be helping-her vomiting episodes appear to be reduced she has gained weight.  Her daughter is concerned thinking that she is having increased tardive dyskinesia symptoms and was wondering about possibly discontinuing the Reglan.      Past Medical History:  Diagnosis Date  . Cancer ([place])   . Dementia   .  Gallstones   . Gastroparalysis   . HTN (hypertension)   . Infiltrating ductal carcinoma of right female breast ([place]) 12/02/2009   Qualifier: History of  By: [name] CMA [name]), June    . Osteoporosis 11/18/2011  . Reflux   . Renal disorder    renal insufficiency.    Past Surgical History:  Procedure Laterality Date  . BREAST BIOPSY    . BREAST LUMPECTOMY    . COLONOSCOPY  2009   Dr. Fuller Plan: normal  . ESOPHAGOGASTRODUODENOSCOPY  12/11/2009   Dr. Fuller Plan: hiatal hernia  . ESOPHAGOGASTRODUODENOSCOPY N/A 01/29/2014   Procedure: ESOPHAGOGASTRODUODENOSCOPY (EGD);  Surgeon: [name], MD;  [facility];  Service: Endoscopy;  Laterality: N/A;  245  . TONSILLECTOMY AND ADENOIDECTOMY      Allergies  Allergen Reactions  . Aricept [Donepezil Hydrochloride] Nausea And Vomiting  . Exelon [Rivastigmine Tartrate]   . Lithium Nausea And Vomiting    Outpatient Encounter Prescriptions as of 01/23/2017  Medication Sig  . acetaminophen (TYLENOL) 325 MG tablet Take one tablet by mouth at bedtime, take 650 mg by mouth every 4 hours prn  . amLODipine (NORVASC) 5 MG tablet Take 5 mg by mouth daily.  [name] docusate (COLACE) 50 MG/5ML liquid Take 100 mg by mouth daily.  . metoCLOPramide (REGLAN) 5 MG tablet Take 5 mg by mouth 2 (two) times daily.  [name] omeprazole (PRILOSEC) 20 MG capsule Take 20 mg by mouth daily.  . ondansetron (ZOFRAN-ODT) 4 MG disintegrating tablet Dissolve 1 tablet in mouth 4 times daily before meals and  at bedtime for nausea  . polyethylene glycol (MIRALAX / GLYCOLAX) packet Take 17 g by mouth daily.    . potassium chloride 20 MEQ/15ML (10%) SOLN Take 40 mEq by mouth daily.   [name] senna-docusate (SENOKOT-S) 8.6-50 MG tablet Take 2 tablets by mouth at bedtime.    No facility-administered encounter medications on file as of 01/23/2017.     Review of Systems Essentially unattainable secondary to dementia please see history of present illness Immunization History  Administered  Date(s) Administered  . Influenza-Unspecified 02/13/2014, 02/09/2016   Pertinent  Health Maintenance Due  Topic Date Due  . INFLUENZA VACCINE  04/08/2017 (Originally 12/07/2016)  . PNA vac Low Risk Adult (1 of 2 - PCV13) 04/08/2017 (Originally 01/13/2003)  . DEXA SCAN  Completed   No flowsheet data found. Functional Status Survey:    Vitals:   01/23/17 1457  BP: 121/77  Pulse: (!) 59  Resp: 18  Temp: 97.6 F (36.4 C)  TempSrc: Oral  Weight is 145 pounds this appears to be trending up  Physical Exam   In general this is a frail elderly female in no distress sitting comfortably in her wheelchair.  Her skin is warm and dry.  Eyes pupils appear reactive to light visual acuity appears grossly intact.  Oropharynx is clear mucous membranes moist.  Chest shallow air entry with poor effort but clear to auscultation there is no labored breathing.  Heart is regular rate and rhythm with an occasional irregular beat this appears baseline has slight lower extremity edema.  Her abdomen is soft does not appear to be tender she is not grimacing when it is palpated bowel sounds are positive.  Neurologic appears grossly intact I could not see will evidence of tremors or lip smacking apparently this is intermittent.  Psych she does have significant dementia does not really follow verbal commands somewhat irritated with exam today  Labs reviewed:  Recent Labs  04/11/16 0700  06/02/16 0700  06/12/16 0546  08/25/16 0700 10/04/16 0715 11/22/16 0700  NA 139  < > 143  < > 143  < > 139 140 141  K 3.9  < > 3.6  < > 3.6  < > 3.5 4.0 4.0  CL 103  < > 105  < > 108  < > 102 105 103  CO2 27  < > 28  < > 25  < > 27 26 28   GLUCOSE 89  < > 101*  < > 104*  < > 88 91 84  BUN 40*  < > 37*  < > 36*  < > 48* 52* 48*  CREATININE 1.50*  < > 1.53*  < > 1.69*  < > 1.99* 2.07* 1.98*  CALCIUM 9.4  < > 9.6  < > 9.1  < > 9.5 9.5 9.6  MG 2.6*  --  2.6*  --   --   --   --   --   --   PHOS  --   --   --   --   2.3*  --   --   --   --   < > = values in this interval not displayed.  Recent Labs  05/25/16 0500 06/10/16 1300 06/10/16 1838 06/12/16 0546  AST 25 41 46*  --   ALT 19 41 51  --   ALKPHOS 70 77 75  --   BILITOT 0.7 0.5 0.4  --   PROT 7.3 8.4* 8.1  --   ALBUMIN 3.9 4.4  4.0 3.4*    Recent Labs  06/10/16 1300 06/10/16 1838 06/11/16 0550 06/15/16 0718 08/25/16 0700  WBC 9.3 9.3 8.5 9.4 5.2  NEUTROABS 8.3* 7.3  --   --  3.0  HGB 15.6* 14.2 14.3 13.1 12.6  HCT 49.8* 44.1 45.1 41.1 38.8  MCV 94.3 93.0 94.0 91.9 89.4  PLT 262 256 274 231 222   Lab Results  Component Value Date   TSH 0.749 06/10/2016   Lab Results  Component Value Date   HGBA1C  05/17/2007    5.4 (NOTE)   The ADA recommends the following therapeutic goals for glycemic   control related to Hgb A1C measurement:   Goal of Therapy:   < 7.0% Hgb A1C   Action Suggested:  > 8.0% Hgb A1C   Ref:  Diabetes Care, 22, Suppl. 1, 1999   Lab Results  Component Value Date   CHOL  05/22/2007    175        ATP III CLASSIFICATION:  <200     mg/dL   Desirable  200-239  mg/dL   Borderline High  >=240    mg/dL   High   HDL 37 (L) 05/22/2007   LDLCALC  05/22/2007    96        Total Cholesterol/HDL:CHD Risk Coronary Heart Disease Risk Table                     Men   Women  1/2 Average Risk   3.4   3.3   TRIG 208 (H) 05/22/2007   CHOLHDL 4.7 05/22/2007    Significant Diagnostic Results in last 30 days:  No results found.  Assessment/Plan  #1 history gastroparesis vomiting-patient does have a long history of this-later in the day I did speak with her daughter at bedside-her daughter would like the Reglan held at least for short period to see how she does-at this point we'll hold the Reglan tonight and reevaluate-and see how her mother does --ifvomiting increases her suspect we will have to bring this back possibly at a lower dose but will temporarily hold it for now and see how she does clinically this was discussed  with nursing as well.  We'll continue the Zofran and Prilosec.   patient appears stable but his fragile in this regards   [id number]-of note--.    More than  25 minutes spent assessing patient-reassessing patient later in the day with daughter was in the room as well as discussion with daughter about her concerns--and  coordinating plan of care with daughter's input.    [name]

## 2017-01-26 ENCOUNTER — Non-Acute Institutional Stay (SKILLED_NURSING_FACILITY): Payer: Medicare Other

## 2017-01-26 DIAGNOSIS — Z0000 Encounter for general adult medical examination without abnormal findings: Secondary | ICD-10-CM

## 2017-01-26 NOTE — Patient Instructions (Signed)
[name] , Thank you for taking time to come for your Medicare Wellness Visit. I appreciate your ongoing commitment to your health goals. Please review the following plan we discussed and let me know if I can assist you in the future.   Screening recommendations/referrals: Colonoscopy excluded, pt is over age 79 Mammogram excluded, pt is over age 79 Bone Density up to date Recommended yearly ophthalmology/optometry visit for glaucoma screening and checkup Recommended yearly dental visit for hygiene and checkup  Vaccinations: Influenza vaccine due Pneumococcal vaccine 13 due, declined by family Tdap vaccine due Shingles vaccine not in records    Advanced directives: DNR in chart, copies of health care power of attorney and living will are needed   Conditions/risks identified: None  Next appointment: Dr. Lyndel Safe makes rounds   Preventive Care 49 Years and Older, Female Preventive care refers to lifestyle choices and visits with your health care provider that can promote health and wellness. What does preventive care include?  A yearly physical exam. This is also called an annual well check.  Dental exams once or twice a year.  Routine eye exams. Ask your health care provider how often you should have your eyes checked.  Personal lifestyle choices, including:  Daily care of your teeth and gums.  Regular physical activity.  Eating a healthy diet.  Avoiding tobacco and drug use.  Limiting alcohol use.  Practicing safe sex.  Taking low-dose aspirin every day.  Taking vitamin and mineral supplements as recommended by your health care provider. What happens during an annual well check? The services and screenings done by your health care provider during your annual well check will depend on your age, overall health, lifestyle risk factors, and family history of disease. Counseling  Your health care provider may ask you questions about your:  Alcohol use.  Tobacco  use.  Drug use.  Emotional well-being.  Home and relationship well-being.  Sexual activity.  Eating habits.  History of falls.  Memory and ability to understand (cognition).  Work and work Statistician.  Reproductive health. Screening  You may have the following tests or measurements:  Height, weight, and BMI.  Blood pressure.  Lipid and cholesterol levels. These may be checked every 5 years, or more frequently if you are over 75 years old.  Skin check.  Lung cancer screening. You may have this screening every year starting at age 53 if you have a 30-pack-year history of smoking and currently smoke or have quit within the past 15 years.  Fecal occult blood test (FOBT) of the stool. You may have this test every year starting at age 59.  Flexible sigmoidoscopy or colonoscopy. You may have a sigmoidoscopy every 5 years or a colonoscopy every 10 years starting at age 60.  Hepatitis C blood test.  Hepatitis B blood test.  Sexually transmitted disease (STD) testing.  Diabetes screening. This is done by checking your blood sugar (glucose) after you have not eaten for a while (fasting). You may have this done every 1-3 years.  Bone density scan. This is done to screen for osteoporosis. You may have this done starting at age 52.  Mammogram. This may be done every 1-2 years. Talk to your health care provider about how often you should have regular mammograms. Talk with your health care provider about your test results, treatment options, and if necessary, the need for more tests. Vaccines  Your health care provider may recommend certain vaccines, such as:  Influenza vaccine. This is recommended every  year.  Tetanus, diphtheria, and acellular pertussis (Tdap, Td) vaccine. You may need a Td booster every 10 years.  Zoster vaccine. You may need this after age 14.  Pneumococcal 13-valent conjugate (PCV13) vaccine. One dose is recommended after age 26.  Pneumococcal  polysaccharide (PPSV23) vaccine. One dose is recommended after age 82. Talk to your health care provider about which screenings and vaccines you need and how often you need them. This information is not intended to replace advice given to you by your health care provider. Make sure you discuss any questions you have with your health care provider. Document Released: 05/22/2015 Document Revised: 01/13/2016 Document Reviewed: 02/24/2015 Elsevier Interactive Patient Education  2017 [place] Prevention in the Home Falls can cause injuries. They can happen to people of all ages. There are many things you can do to make your home safe and to help prevent falls. What can I do on the outside of my home?  Regularly fix the edges of walkways and driveways and fix any cracks.  Remove anything that might make you trip as you walk through a door, such as a raised step or threshold.  Trim any bushes or trees on the path to your home.  Use bright outdoor lighting.  Clear any walking paths of anything that might make someone trip, such as rocks or tools.  Regularly check to see if handrails are loose or broken. Make sure that both sides of any steps have handrails.  Any raised decks and porches should have guardrails on the edges.  Have any leaves, snow, or ice cleared regularly.  Use sand or salt on walking paths during winter.  Clean up any spills in your garage right away. This includes oil or grease spills. What can I do in the bathroom?  Use night lights.  Install grab bars by the toilet and in the tub and shower. Do not use towel bars as grab bars.  Use non-skid mats or decals in the tub or shower.  If you need to sit down in the shower, use a plastic, non-slip stool.  Keep the floor dry. Clean up any water that spills on the floor as soon as it happens.  Remove soap buildup in the tub or shower regularly.  Attach bath mats securely with double-sided non-slip rug  tape.  Do not have throw rugs and other things on the floor that can make you trip. What can I do in the bedroom?  Use night lights.  Make sure that you have a light by your bed that is easy to reach.  Do not use any sheets or blankets that are too big for your bed. They should not hang down onto the floor.  Have a firm chair that has side arms. You can use this for support while you get dressed.  Do not have throw rugs and other things on the floor that can make you trip. What can I do in the kitchen?  Clean up any spills right away.  Avoid walking on wet floors.  Keep items that you use a lot in easy-to-reach places.  If you need to reach something above you, use a strong step stool that has a grab bar.  Keep electrical cords out of the way.  Do not use floor polish or wax that makes floors slippery. If you must use wax, use non-skid floor wax.  Do not have throw rugs and other things on the floor that can make you trip. What can  I do with my stairs?  Do not leave any items on the stairs.  Make sure that there are handrails on both sides of the stairs and use them. Fix handrails that are broken or loose. Make sure that handrails are as long as the stairways.  Check any carpeting to make sure that it is firmly attached to the stairs. Fix any carpet that is loose or worn.  Avoid having throw rugs at the top or bottom of the stairs. If you do have throw rugs, attach them to the floor with carpet tape.  Make sure that you have a light switch at the top of the stairs and the bottom of the stairs. If you do not have them, ask someone to add them for you. What else can I do to help prevent falls?  Wear shoes that:  Do not have high heels.  Have rubber bottoms.  Are comfortable and fit you well.  Are closed at the toe. Do not wear sandals.  If you use a stepladder:  Make sure that it is fully opened. Do not climb a closed stepladder.  Make sure that both sides of the  stepladder are locked into place.  Ask someone to hold it for you, if possible.  Clearly mark and make sure that you can see:  Any grab bars or handrails.  First and last steps.  Where the edge of each step is.  Use tools that help you move around (mobility aids) if they are needed. These include:  Canes.  Walkers.  Scooters.  Crutches.  Turn on the lights when you go into a dark area. Replace any light bulbs as soon as they burn out.  Set up your furniture so you have a clear path. Avoid moving your furniture around.  If any of your floors are uneven, fix them.  If there are any pets around you, be aware of where they are.  Review your medicines with your doctor. Some medicines can make you feel dizzy. This can increase your chance of falling. Ask your doctor what other things that you can do to help prevent falls. This information is not intended to replace advice given to you by your health care provider. Make sure you discuss any questions you have with your health care provider. Document Released: 02/19/2009 Document Revised: 10/01/2015 Document Reviewed: 05/30/2014 Elsevier Interactive Patient Education  2017 [employer].

## 2017-01-26 NOTE — Progress Notes (Signed)
Subjective:   [name] is a 81 y.o. female who presents for an Initial Medicare Annual Wellness Visit at [facility] ; incapacitated patient unable to answer questions appropriately        Objective:    Today's Vitals   01/26/17 1036  BP: 122/64  Pulse: (!) 59  Temp: (!) 96.1 F (35.6 C)  TempSrc: Axillary  SpO2: 97%  Weight: 141 lb (64 kg)  Height: 5\' 7"  (1.702 m)   Body mass index is 22.08 kg/m.   Current Medications (verified) Outpatient Encounter Prescriptions as of 01/26/2017  Medication Sig  . acetaminophen (TYLENOL) 325 MG tablet Take one tablet by mouth at bedtime, take 650 mg by mouth every 4 hours prn  . amLODipine (NORVASC) 5 MG tablet Take 5 mg by mouth daily.  [name] docusate (COLACE) 50 MG/5ML liquid Take 100 mg by mouth daily.  [name] omeprazole (PRILOSEC) 20 MG capsule Take 20 mg by mouth daily.  . ondansetron (ZOFRAN-ODT) 4 MG disintegrating tablet Dissolve 1 tablet in mouth 4 times daily before meals and at bedtime for nausea  . polyethylene glycol (MIRALAX / GLYCOLAX) packet Take 17 g by mouth daily.    . potassium chloride 20 MEQ/15ML (10%) SOLN Take 40 mEq by mouth daily.   [name] senna-docusate (SENOKOT-S) 8.6-50 MG tablet Take 2 tablets by mouth at bedtime.   . [DISCONTINUED] metoCLOPramide (REGLAN) 5 MG tablet Take 5 mg by mouth 2 (two) times daily.   No facility-administered encounter medications on file as of 01/26/2017.     Allergies (verified) Aricept [donepezil hydrochloride]; Exelon [rivastigmine tartrate]; and Lithium   History: Past Medical History:  Diagnosis Date  . Cancer ([place])   . Dementia   . Gallstones   . Gastroparalysis   . HTN (hypertension)   . Infiltrating ductal carcinoma of right female breast ([place]) 12/02/2009   Qualifier: History of  By: [name] CMA [name]), June    . Osteoporosis 11/18/2011  . Reflux   . Renal disorder    renal insufficiency.    Past Surgical History:  Procedure Laterality Date  .  BREAST BIOPSY    . BREAST LUMPECTOMY    . COLONOSCOPY  2009   [name]: normal  . ESOPHAGOGASTRODUODENOSCOPY  12/11/2009   [name]: hiatal hernia  . ESOPHAGOGASTRODUODENOSCOPY N/A 01/29/2014   Procedure: ESOPHAGOGASTRODUODENOSCOPY (EGD);  Surgeon: April Dolin, MD;  [facility];  Service: Endoscopy;  Laterality: N/A;  245  . TONSILLECTOMY AND ADENOIDECTOMY     Family History  Problem Relation Age of Onset  . Cancer Sister        breast  . Cancer Unknown   . Colon cancer Neg Hx    Social History   Occupational History  . retired Retired   Social History Main Topics  . Smoking status: Never Smoker  . Smokeless tobacco: Never Used  . Alcohol use No  . Drug use: No  . Sexual activity: Not on file    Tobacco Counseling Counseling given: Not Answered   Activities of Daily Living In your present state of health, do you have any difficulty performing the following activities: 01/26/2017 06/11/2016  Hearing? [name]? N -  Difficulty concentrating or making decisions? Y -  Walking or climbing stairs? Y -  Dressing or bathing? Y -  Doing errands, shopping? [name]  Preparing Food and eating ? Y -  Using the Toilet? Y -  In the past six months,  have you accidently leaked urine? Y -  Do you have problems with loss of bowel control? Y -  Managing your Medications? Y -  Managing your Finances? Y -  Housekeeping or managing your Housekeeping? Y -  Some recent data might be hidden    Immunizations and Health Maintenance Immunization History  Administered Date(s) Administered  . Influenza-Unspecified 02/13/2014, 02/09/2016   There are no preventive care reminders to display for this patient.  Patient Care Team: April Dad, MD as PCP - General (Internal Medicine) April Romney [name], MD as Consulting Physician (Gastroenterology)  Indicate any recent Medical Services you may have received from other than [facility] providers in the past year (date may be  approximate).     Assessment:   This is a routine wellness examination for [place].   Hearing/Vision screen No exam data present  Dietary issues and exercise activities discussed: Current Exercise Habits: The patient does not participate in regular exercise at present, Exercise limited by: neurologic condition(s)  Goals    None     Depression Screen PHQ 2/9 Scores 01/26/2017  Exception Documentation Medical reason    Fall Risk Fall Risk  01/26/2017  Falls in the past year? No    Cognitive Function: MMSE - Mini Mental State Exam 01/26/2017  Not completed: Unable to complete        Screening Tests Health Maintenance  Topic Date Due  . INFLUENZA VACCINE  04/08/2017 (Originally 12/07/2016)  . PNA vac Low Risk Adult (1 of 2 - PCV13) 04/08/2017 (Originally 01/13/2003)  . TETANUS/TDAP  10/05/2025 (Originally 01/12/1955)  . DEXA SCAN  Completed      Thompson:  I have personally reviewed and addressed the Medicare Annual Wellness questionnaire and have noted the following in the patient's chart:  A. Medical and social history B. Use of alcohol, tobacco or illicit drugs  C. Current medications and supplements D. Functional ability and status E.  Nutritional status F.  Physical activity G. Advance directives H. List of other physicians I.  Hospitalizations, surgeries, and ER visits in previous 12 months J.  [place] to include hearing, vision, cognitive, depression L. Referrals and appointments - none  In addition, I am unable to review and discuss with incapacitated patient certain preventive protocols, quality metrics, and best practice recommendations. A written personalized care Thompson for preventive services as well as general preventive health recommendations were provided to patient.   See attached scanned questionnaire for additional information.   Signed,   April Reining, RN Nurse Health Advisor   Quick Notes   Health Maintenance: TDAP and  Flu vaccine  due. Prevnar due, declined by family     Abnormal Screen: Unable to complete mental exam     Patient Concerns: None     Nurse Concerns: None

## 2017-02-20 ENCOUNTER — Encounter: Payer: Self-pay | Admitting: Internal Medicine

## 2017-02-20 ENCOUNTER — Non-Acute Institutional Stay (SKILLED_NURSING_FACILITY): Payer: Medicare Other | Admitting: Internal Medicine

## 2017-02-20 DIAGNOSIS — K219 Gastro-esophageal reflux disease without esophagitis: Secondary | ICD-10-CM | POA: Diagnosis not present

## 2017-02-20 DIAGNOSIS — K3184 Gastroparesis: Secondary | ICD-10-CM

## 2017-02-20 DIAGNOSIS — H1011 Acute atopic conjunctivitis, right eye: Secondary | ICD-10-CM

## 2017-02-20 DIAGNOSIS — G309 Alzheimer's disease, unspecified: Secondary | ICD-10-CM

## 2017-02-20 DIAGNOSIS — F0281 Dementia in other diseases classified elsewhere with behavioral disturbance: Secondary | ICD-10-CM | POA: Diagnosis not present

## 2017-02-20 NOTE — Progress Notes (Signed)
Location:   [facility] Room Number: 110/W Place of Service:  SNF [phone]) Provider:  Kyung Rudd, Rene Kocher, MD  Patient Care Team: [name], MD as PCP - General (Internal Medicine) [name], [name], MD as Consulting Physician (Gastroenterology)  Extended Emergency Contact Information Primary Emergency Contact: [name] Address: [address]          [place], [place] [address] [name] of [place] Phone: [phone] Work Phone: [phone] Mobile Phone: [phone] Relation: Daughter Secondary Emergency Contact: [name] Address: [place]          [place], [place] [address] [name] of [place] Phone: [phone] Mobile Phone: [phone] Relation: Relative  Code Status:  DNR Goals of care: Advanced Directive information Advanced Directives 02/20/2017  Does Patient Have a Medical Advance Directive? Yes  Type of Advance Directive Out of facility DNR (pink MOST or yellow form)  Does patient want to make changes to medical advance directive? No - Patient declined  Copy of [place] in Chart? No - copy requested  Would patient like information on creating a medical advance directive? No - Patient declined  Pre-existing out of facility DNR order (yellow form or pink MOST form) -     Chief Complaint  Patient presents with  . Acute Visit    Right Eye Redness    HPI:  Pt is a 81 y.o. female seen today for an acute visit for  Right Eye Redness and itching Patient has h/o hypertension, Advanced Dementia, Chronic renal disease, Breast cancer s/p Mastectomy, GERD and Gastroparesis  Patient daughter again noticed this morning that patient had redness in her right eye and she was rubbing that eye. Patient is unable to give me any history due to her dementia. D/W the Nurse and she said that patient had some redness in her eye this morning when she got up. And she was itching that eye. No discharge. She  had same issue 2 months ago and was treated with Antihistamine drops.   Past Medical History:  Diagnosis Date  . Cancer ([place])   . Dementia   . Gallstones   . Gastroparalysis   . HTN (hypertension)   . Infiltrating ductal carcinoma of right female breast ([place]) 12/02/2009   Qualifier: History of  By: [name] CMA [name]), June    . Osteoporosis 11/18/2011  . Reflux   . Renal disorder    renal insufficiency.    Past Surgical History:  Procedure Laterality Date  . BREAST BIOPSY    . BREAST LUMPECTOMY    . COLONOSCOPY  2009   Dr. Fuller Plan: normal  . ESOPHAGOGASTRODUODENOSCOPY  12/11/2009   Dr. Fuller Plan: hiatal hernia  . ESOPHAGOGASTRODUODENOSCOPY N/A 01/29/2014   Procedure: ESOPHAGOGASTRODUODENOSCOPY (EGD);  Surgeon: [name], MD;  [facility];  Service: Endoscopy;  Laterality: N/A;  245  . TONSILLECTOMY AND ADENOIDECTOMY      Allergies  Allergen Reactions  . Aricept [Donepezil Hydrochloride] Nausea And Vomiting  . Exelon [Rivastigmine Tartrate]   . Lithium Nausea And Vomiting    Outpatient Encounter Prescriptions as of 02/20/2017  Medication Sig  . acetaminophen (TYLENOL) 325 MG tablet Take one tablet by mouth at bedtime, take 650 mg by mouth every 4 hours prn  . amLODipine (NORVASC) 5 MG tablet Take 5 mg by mouth daily.  [name] docusate (COLACE) 50 MG/5ML liquid Take 100 mg by mouth daily.  [name] omeprazole (PRILOSEC) 20 MG capsule Take 20 mg by mouth daily.  [name]  ondansetron (ZOFRAN-ODT) 4 MG disintegrating tablet Dissolve 1 tablet in mouth 4 times daily before meals and at bedtime for nausea  . polyethylene glycol (MIRALAX / GLYCOLAX) packet Take 17 g by mouth daily.    . potassium chloride 20 MEQ/15ML (10%) SOLN Take 40 mEq by mouth daily.   [name] senna-docusate (SENOKOT-S) 8.6-50 MG tablet Take 2 tablets by mouth at bedtime.    No facility-administered encounter medications on file as of 02/20/2017.      Review of Systems  Unable to perform ROS: Dementia     Immunization History  Administered Date(s) Administered  . Influenza-Unspecified 02/13/2014, 02/09/2016, 02/10/2017  . Pneumococcal Polysaccharide-23 02/29/2016  . Tdap 01/30/2017   Pertinent  Health Maintenance Due  Topic Date Due  . PNA vac Low Risk Adult (2 of 2 - PCV13) 04/08/2017 (Originally 02/28/2017)  . INFLUENZA VACCINE  Completed  . DEXA SCAN  Completed   Fall Risk  01/26/2017  Falls in the past year? No   Functional Status Survey:    Vitals:   02/20/17 1108  BP: 138/81  Pulse: 64  Resp: 18  Temp: (!) 97.5 F (36.4 C)  TempSrc: Oral  SpO2: 98%   There is no height or weight on file to calculate BMI. Physical Exam  Constitutional: She appears well-developed.  HENT:  Head: Normocephalic.  Mouth/Throat: Oropharynx is clear and moist.  Eyes:  No redness or Discharge from the eye.  Cardiovascular: Normal rate and normal heart sounds.   Pulmonary/Chest: Effort normal and breath sounds normal. No respiratory distress. She has no wheezes.  Abdominal: Soft. Bowel sounds are normal. She exhibits no distension. There is no tenderness. There is no rebound.  Musculoskeletal: She exhibits no edema.  Neurological: She is alert.  Skin: Skin is warm and dry.    Labs reviewed:  Recent Labs  04/11/16 0700  06/02/16 0700  06/12/16 0546  08/25/16 0700 10/04/16 0715 11/22/16 0700  NA 139  < > 143  < > 143  < > 139 140 141  K 3.9  < > 3.6  < > 3.6  < > 3.5 4.0 4.0  CL 103  < > 105  < > 108  < > 102 105 103  CO2 27  < > 28  < > 25  < > 27 26 28   GLUCOSE 89  < > 101*  < > 104*  < > 88 91 84  BUN 40*  < > 37*  < > 36*  < > 48* 52* 48*  CREATININE 1.50*  < > 1.53*  < > 1.69*  < > 1.99* 2.07* 1.98*  CALCIUM 9.4  < > 9.6  < > 9.1  < > 9.5 9.5 9.6  MG 2.6*  --  2.6*  --   --   --   --   --   --   PHOS  --   --   --   --  2.3*  --   --   --   --   < > = values in this interval not displayed.  Recent Labs  05/25/16 0500 06/10/16 1300 06/10/16 1838 06/12/16 0546   AST 25 41 46*  --   ALT 19 41 51  --   ALKPHOS 70 77 75  --   BILITOT 0.7 0.5 0.4  --   PROT 7.3 8.4* 8.1  --   ALBUMIN 3.9 4.4 4.0 3.4*    Recent Labs  06/10/16 1300 06/10/16 1838 06/11/16 0550 06/15/16  3474 08/25/16 0700  WBC 9.3 9.3 8.5 9.4 5.2  NEUTROABS 8.3* 7.3  --   --  3.0  HGB 15.6* 14.2 14.3 13.1 12.6  HCT 49.8* 44.1 45.1 41.1 38.8  MCV 94.3 93.0 94.0 91.9 89.4  PLT 262 256 274 231 222   Lab Results  Component Value Date   TSH 0.749 06/10/2016   Lab Results  Component Value Date   HGBA1C  05/17/2007    5.4 (NOTE)   The ADA recommends the following therapeutic goals for glycemic   control related to Hgb A1C measurement:   Goal of Therapy:   < 7.0% Hgb A1C   Action Suggested:  > 8.0% Hgb A1C   Ref:  Diabetes Care, 22, Suppl. 1, 1999   Lab Results  Component Value Date   CHOL  05/22/2007    175        ATP III CLASSIFICATION:  <200     mg/dL   Desirable  200-239  mg/dL   Borderline High  >=240    mg/dL   High   HDL 37 (L) 05/22/2007   LDLCALC  05/22/2007    96        Total Cholesterol/HDL:CHD Risk Coronary Heart Disease Risk Table                     Men   Women  1/2 Average Risk   3.4   3.3   TRIG 208 (H) 05/22/2007   CHOLHDL 4.7 05/22/2007    Significant Diagnostic Results in last 30 days:  No results found.  Assessment/Plan Red Eye with itching Possible allergic  Will restart her on Antihistamine drops for few days. Reevaluate if not better.  Gastroparesis Patient is not on Reglan anymore per Daughter request and she is stable. Weight is stable so far. Gastroesophageal reflux disease  Continue on Omeprazole  Essential hypertension Stable on Norvasc.  Alzheimer's dementia Stable Continue Supportive care. MOST Form in the cha Chronic renal disease Creat stable  Family/ staff Communication:   Labs/tests ordered:   Total time spent in this patient care encounter was _ 36minutes; greater than 50% of the visit spent counseling  patient, reviewing records , Labs and coordinating care for problems addressed at this encounter.

## 2017-02-23 ENCOUNTER — Encounter: Payer: Self-pay | Admitting: Internal Medicine

## 2017-02-23 ENCOUNTER — Non-Acute Institutional Stay (SKILLED_NURSING_FACILITY): Payer: Medicare Other | Admitting: Internal Medicine

## 2017-02-23 DIAGNOSIS — G309 Alzheimer's disease, unspecified: Secondary | ICD-10-CM | POA: Diagnosis not present

## 2017-02-23 DIAGNOSIS — I10 Essential (primary) hypertension: Secondary | ICD-10-CM

## 2017-02-23 DIAGNOSIS — F0281 Dementia in other diseases classified elsewhere with behavioral disturbance: Secondary | ICD-10-CM

## 2017-02-23 DIAGNOSIS — K3184 Gastroparesis: Secondary | ICD-10-CM | POA: Diagnosis not present

## 2017-02-23 DIAGNOSIS — N289 Disorder of kidney and ureter, unspecified: Secondary | ICD-10-CM | POA: Diagnosis not present

## 2017-02-23 DIAGNOSIS — K219 Gastro-esophageal reflux disease without esophagitis: Secondary | ICD-10-CM | POA: Diagnosis not present

## 2017-02-23 NOTE — Progress Notes (Signed)
Location:   [facility] Room Number: 110/W Place of Service:  SNF [phone]) Provider:  Kyung Rudd, Rene Kocher, MD  Patient Care Team: [name], MD as PCP - General (Internal Medicine) [name], [name], MD as Consulting Physician (Gastroenterology)  Extended Emergency Contact Information Primary Emergency Contact: [name] Address: [address]          [place], [place] [address] [name] of [place] Phone: [phone] Work Phone: [phone] Mobile Phone: [phone] Relation: Daughter Secondary Emergency Contact: [name] Address: [place]          [place], [place] [address] [name] of [place] Phone: [phone] Mobile Phone: [phone] Relation: Relative  Code Status:  DNR Goals of care: Advanced Directive information Advanced Directives 02/23/2017  Does Patient Have a Medical Advance Directive? Yes  Type of Advance Directive Out of facility DNR (pink MOST or yellow form)  Does patient want to make changes to medical advance directive? No - Patient declined  Copy of [place] in Chart? No - copy requested  Would patient like information on creating a medical advance directive? No - Patient declined  Pre-existing out of facility DNR order (yellow form or pink MOST form) -     Chief Complaint  Patient presents with  . Medical Management of Chronic Issues    Routine Visit    HPI:  Pt is a 81 y.o. female seen today for medical management of chronic diseases.    Patient has h/o hypertension, Advanced Dementia, Chronic renal disease, Breast cancer s/p Mastectomy, GERD and Gastroparesis. Patient is long term Resident of facility. Her Weight is up from 141 lbs to 144 Lbs. No New Nursing issues. Her Reglan was discontinued as per request of her Daughter.  She had been on that for Gastroparesis with Vomiting. But Patient actually has been stable and has not been having any episodes of vomiting.  She continues to be on Around the Clock Zofran and Prilosec. Patient unable to give me any history due to her Dementia. Past Medical History:  Diagnosis Date  . Cancer ([facility])   . Dementia   . Gallstones   . Gastroparalysis   . HTN (hypertension)   . Infiltrating ductal carcinoma of right female breast ([facility]) 12/02/2009   Qualifier: History of  By: [name] CMA [name]), June    . Osteoporosis 11/18/2011  . Reflux   . Renal disorder    renal insufficiency.    Past Surgical History:  Procedure Laterality Date  . BREAST BIOPSY    . BREAST LUMPECTOMY    . COLONOSCOPY  2009   Dr. Fuller Plan: normal  . ESOPHAGOGASTRODUODENOSCOPY  12/11/2009   Dr. Fuller Plan: hiatal hernia  . ESOPHAGOGASTRODUODENOSCOPY N/A 01/29/2014   Procedure: ESOPHAGOGASTRODUODENOSCOPY (EGD);  Surgeon: [name], MD;  [facility];  Service: Endoscopy;  Laterality: N/A;  245  . TONSILLECTOMY AND ADENOIDECTOMY      Allergies  Allergen Reactions  . Aricept [Donepezil Hydrochloride] Nausea And Vomiting  . Exelon [Rivastigmine Tartrate]   . Lithium Nausea And Vomiting    Outpatient Encounter Prescriptions as of 02/23/2017  Medication Sig  . acetaminophen (TYLENOL) 325 MG tablet Take one tablet by mouth at bedtime, take 650 mg by mouth every 4 hours prn  . Alcaftadine 0.25 % SOLN Apply 2 drops to the right eye once a day from 02/20/2017-02/23/2017 and then apply 2 drops to right eye once a day prn  . amLODipine (NORVASC) 5 MG tablet Take  5 mg by mouth daily.  [name] docusate (COLACE) 50 MG/5ML liquid Take 100 mg by mouth daily.  [name] omeprazole (PRILOSEC) 20 MG capsule Take 20 mg by mouth daily.  . ondansetron (ZOFRAN-ODT) 4 MG disintegrating tablet Dissolve 1 tablet in mouth 4 times daily before meals and at bedtime for nausea  . polyethylene glycol (MIRALAX / GLYCOLAX) packet Take 17 g by mouth daily.    . potassium chloride 20 MEQ/15ML (10%) SOLN Take 40 mEq by mouth daily.   [name] senna-docusate (SENOKOT-S) 8.6-50 MG  tablet Take 2 tablets by mouth at bedtime.    No facility-administered encounter medications on file as of 02/23/2017.      Review of Systems  Unable to perform ROS: Dementia    Immunization History  Administered Date(s) Administered  . Influenza-Unspecified 02/13/2014, 02/09/2016, 02/10/2017  . Pneumococcal Polysaccharide-23 02/29/2016  . Tdap 01/30/2017   Pertinent  Health Maintenance Due  Topic Date Due  . PNA vac Low Risk Adult (2 of 2 - PCV13) 04/08/2017 (Originally 02/28/2017)  . INFLUENZA VACCINE  Completed  . DEXA SCAN  Completed   Fall Risk  01/26/2017  Falls in the past year? No   Functional Status Survey:    Vitals:   02/23/17 0931  BP: 118/72  Pulse: 77  Resp: 18  Temp: (!) 97.4 F (36.3 C)  TempSrc: Oral  SpO2: 92%  Weight: 144 lb 9.6 oz (65.6 kg)  Height: 5\' 7"  (1.702 m)   Body mass index is 22.65 kg/m. Physical Exam  Constitutional: She appears well-developed and well-nourished.  HENT:  Head: Normocephalic.  Mouth/Throat: Oropharynx is clear and moist.  Eyes: Pupils are equal, round, and reactive to light.  Neck: Neck supple.  Cardiovascular: Normal rate and normal heart sounds.   Pulmonary/Chest: Effort normal and breath sounds normal. No respiratory distress. She has no wheezes. She has no rales.  Abdominal: Soft. Bowel sounds are normal. She exhibits no distension. There is no tenderness. There is no rebound.  Musculoskeletal: She exhibits no edema.  Neurological: She is alert.  Does not Follow any commands  Skin: Skin is warm and dry.  Psychiatric: She has a normal mood and affect. Her behavior is normal.    Labs reviewed:  Recent Labs  04/11/16 0700  06/02/16 0700  06/12/16 0546  08/25/16 0700 10/04/16 0715 11/22/16 0700  NA 139  < > 143  < > 143  < > 139 140 141  K 3.9  < > 3.6  < > 3.6  < > 3.5 4.0 4.0  CL 103  < > 105  < > 108  < > 102 105 103  CO2 27  < > 28  < > 25  < > 27 26 28   GLUCOSE 89  < > 101*  < > 104*  < > 88 91  84  BUN 40*  < > 37*  < > 36*  < > 48* 52* 48*  CREATININE 1.50*  < > 1.53*  < > 1.69*  < > 1.99* 2.07* 1.98*  CALCIUM 9.4  < > 9.6  < > 9.1  < > 9.5 9.5 9.6  MG 2.6*  --  2.6*  --   --   --   --   --   --   PHOS  --   --   --   --  2.3*  --   --   --   --   < > = values in this interval not displayed.  Recent Labs  05/25/16 0500 06/10/16 1300 06/10/16 1838 06/12/16 0546  AST 25 41 46*  --   ALT 19 41 51  --   ALKPHOS 70 77 75  --   BILITOT 0.7 0.5 0.4  --   PROT 7.3 8.4* 8.1  --   ALBUMIN 3.9 4.4 4.0 3.4*    Recent Labs  06/10/16 1300 06/10/16 1838 06/11/16 0550 06/15/16 0718 08/25/16 0700  WBC 9.3 9.3 8.5 9.4 5.2  NEUTROABS 8.3* 7.3  --   --  3.0  HGB 15.6* 14.2 14.3 13.1 12.6  HCT 49.8* 44.1 45.1 41.1 38.8  MCV 94.3 93.0 94.0 91.9 89.4  PLT 262 256 274 231 222   Lab Results  Component Value Date   TSH 0.749 06/10/2016   Lab Results  Component Value Date   HGBA1C  05/17/2007    5.4 (NOTE)   The ADA recommends the following therapeutic goals for glycemic   control related to Hgb A1C measurement:   Goal of Therapy:   < 7.0% Hgb A1C   Action Suggested:  > 8.0% Hgb A1C   Ref:  Diabetes Care, 22, Suppl. 1, 1999   Lab Results  Component Value Date   CHOL  05/22/2007    175        ATP III CLASSIFICATION:  <200     mg/dL   Desirable  200-239  mg/dL   Borderline High  >=240    mg/dL   High   HDL 37 (L) 05/22/2007   LDLCALC  05/22/2007    96        Total Cholesterol/HDL:CHD Risk Coronary Heart Disease Risk Table                     Men   Women  1/2 Average Risk   3.4   3.3   TRIG 208 (H) 05/22/2007   CHOLHDL 4.7 05/22/2007    Significant Diagnostic Results in last 30 days:  No results found.  Assessment/Plan  Chronic renal insufficiency stage 4 Clinically stable. Will D/w daughter to see if we need to repeat her Labs.   Gastroparesis Reglan Discontinued. She is doing well and Gaining Weight. Is on Prilosec and Zofran  . Gastroesophageal reflux  disease  Continue on Omeprazole  Essential hypertension Stable on Norvasc  Alzheimer's dementia Stable. Continue Supportive care Has MOST form in the Chart  .    Family/ staff Communication:   Labs/tests ordered:   Total time spent in this patient care encounter was 25_ minutes; greater than 50% of the visit spent counseling patient, reviewing records , Labs and coordinating care for problems addressed at this encounter.

## 2017-02-24 ENCOUNTER — Encounter (HOSPITAL_COMMUNITY)
Admission: RE | Admit: 2017-02-24 | Discharge: 2017-02-24 | Disposition: A | Discharge status: Home / Self Care | Payer: Medicare Other | Source: Skilled Nursing Facility | Attending: Internal Medicine | Admitting: Internal Medicine

## 2017-02-24 DIAGNOSIS — K3184 Gastroparesis: Secondary | ICD-10-CM | POA: Insufficient documentation

## 2017-02-24 LAB — BASIC METABOLIC PANEL
Anion gap: 9 (ref 5–15)
BUN: 48 mg/dL — AB (ref 6–20)
CALCIUM: 9.4 mg/dL (ref 8.9–10.3)
CO2: 29 mmol/L (ref 22–32)
Chloride: 104 mmol/L (ref 101–111)
Creatinine, Ser: 1.83 mg/dL — ABNORMAL HIGH (ref 0.44–1.00)
GFR calc Af Amer: 29 mL/min — ABNORMAL LOW (ref >60)
GFR calc non Af Amer: 25 mL/min — ABNORMAL LOW (ref >60)
GLUCOSE: 84 mg/dL (ref 65–99)
Potassium: 3.7 mmol/L (ref 3.5–5.1)
Sodium: 142 mmol/L (ref 135–145)

## 2017-02-27 ENCOUNTER — Encounter (HOSPITAL_COMMUNITY)
Admission: RE | Admit: 2017-02-27 | Discharge: 2017-02-27 | Disposition: A | Discharge status: Home / Self Care | Payer: Medicare Other | Source: Skilled Nursing Facility | Attending: *Deleted | Admitting: *Deleted

## 2017-02-27 DIAGNOSIS — K3184 Gastroparesis: Secondary | ICD-10-CM | POA: Diagnosis not present

## 2017-02-27 LAB — VITAMIN B12: Vitamin B-12: 738 pg/mL (ref 180–914)

## 2017-02-28 DIAGNOSIS — H40013 Open angle with borderline findings, low risk, bilateral: Secondary | ICD-10-CM | POA: Diagnosis not present

## 2017-02-28 DIAGNOSIS — H2513 Age-related nuclear cataract, bilateral: Secondary | ICD-10-CM | POA: Diagnosis not present

## 2017-02-28 LAB — VITAMIN D 25 HYDROXY (VIT D DEFICIENCY, FRACTURES): VIT D 25 HYDROXY: 30.4 ng/mL (ref 30.0–100.0)

## 2017-03-03 ENCOUNTER — Encounter: Payer: Self-pay | Admitting: Internal Medicine

## 2017-03-03 ENCOUNTER — Non-Acute Institutional Stay (SKILLED_NURSING_FACILITY): Payer: Medicare Other | Admitting: Internal Medicine

## 2017-03-03 DIAGNOSIS — S0083XA Contusion of other part of head, initial encounter: Secondary | ICD-10-CM | POA: Diagnosis not present

## 2017-03-03 DIAGNOSIS — N189 Chronic kidney disease, unspecified: Secondary | ICD-10-CM

## 2017-03-03 DIAGNOSIS — I10 Essential (primary) hypertension: Secondary | ICD-10-CM

## 2017-03-03 NOTE — Progress Notes (Signed)
Location:   [facility] Room Number: 110/W Place of Service:  SNF [phone]) Provider:  Freddi Starr, MD  Patient Care Team: [name], MD as PCP - General (Internal Medicine) [name], [name], MD as Consulting Physician (Gastroenterology)  Extended Emergency Contact Information Primary Emergency Contact: [name] Address: [address]          [place], [place] [address] [name] of [place] Phone: [phone] Work Phone: [phone] Mobile Phone: [phone] Relation: Daughter Secondary Emergency Contact: [name] Address: [address]          [place], [place] [address] [name] of [place] Phone: [phone] Mobile Phone: [phone] Relation: Relative  Code Status:  DNR Goals of care: Advanced Directive information Advanced Directives 03/03/2017  Does Patient Have a Medical Advance Directive? Yes  Type of Advance Directive Out of facility DNR (pink MOST or yellow form)  Does patient want to make changes to medical advance directive? No - Patient declined  Copy of [facility] in Chart? No - copy requested  Would patient like information on creating a medical advance directive? No - Patient declined  Pre-existing out of facility DNR order (yellow form or pink MOST form) -     Chief Complaint  Patient presents with  . Acute Visit    Left side of forehead discolorization    HPI:  Pt is a 81 y.o. female seen today for an acute visit for What appears to be a bruise on the left side of her 400.  There's been no listed history of trauma there and she appears to be at her baseline today.   bruise actually appears somewhat old brawny appearing.  She is a long-term resident of the facility and has a history of advanced dementia as well as hypertension chronic renal disease history of breast cancer with mastectomy GERD and gastroparesis.  Per staff she continues to be at baseline with advanced  dementia but apparently has gained a small amount of weight just history of recurrent vomiting but this has improved on routine Zofran dosing as well as a proton pump inhibitor.  Currently she is sitting in her wheelchair comfortably     Past Medical History:  Diagnosis Date  . Cancer ([place])   . Dementia   . Gallstones   . Gastroparalysis   . HTN (hypertension)   . Infiltrating ductal carcinoma of right female breast ([facility] [facility]) 12/02/2009   Qualifier: History of  By: [name] CMA [name]), June    . Osteoporosis 11/18/2011  . Reflux   . Renal disorder    renal insufficiency.    Past Surgical History:  Procedure Laterality Date  . BREAST BIOPSY    . BREAST LUMPECTOMY    . COLONOSCOPY  2009   Dr. Fuller Plan: normal  . ESOPHAGOGASTRODUODENOSCOPY  12/11/2009   Dr. Fuller Plan: hiatal hernia  . ESOPHAGOGASTRODUODENOSCOPY N/A 01/29/2014   Procedure: ESOPHAGOGASTRODUODENOSCOPY (EGD);  Surgeon: [name], MD;  Location: [facility];  Service: Endoscopy;  Laterality: N/A;  245  . TONSILLECTOMY AND ADENOIDECTOMY      Allergies  Allergen Reactions  . Aricept [Donepezil Hydrochloride] Nausea And Vomiting  . Exelon [Rivastigmine Tartrate]   . Lithium Nausea And Vomiting    Outpatient Encounter Prescriptions as of 03/03/2017  Medication Sig  . acetaminophen (TYLENOL) 325 MG tablet Take one tablet by mouth at bedtime, take 650 mg by mouth every 4 hours prn  . amLODipine (NORVASC) 5 MG tablet Take 5  mg by mouth daily.  [name] docusate (COLACE) 50 MG/5ML liquid Take 100 mg by mouth daily.  [name] omeprazole (PRILOSEC) 20 MG capsule Take 20 mg by mouth daily.  . ondansetron (ZOFRAN-ODT) 4 MG disintegrating tablet Dissolve 1 tablet in mouth 4 times daily before meals and at bedtime for nausea  . polyethylene glycol (MIRALAX / GLYCOLAX) packet Take 17 g by mouth daily.    . potassium chloride 20 MEQ/15ML (10%) SOLN Take 40 mEq by mouth daily.   [name] senna-docusate (SENOKOT-S) 8.6-50 MG tablet Take 2 tablets by  mouth at bedtime.   . [DISCONTINUED] Alcaftadine 0.25 % SOLN Apply 2 drops to the right eye once a day from 02/20/2017-02/23/2017 and then apply 2 drops to right eye once a day prn   No facility-administered encounter medications on file as of 03/03/2017.     Review of Systems   Unattainable secondary to dementia please see history of present illness  Immunization History  Administered Date(s) Administered  . Influenza-Unspecified 02/13/2014, 02/09/2016, 02/10/2017  . Pneumococcal Polysaccharide-23 02/29/2016  . Tdap 01/30/2017   Pertinent  Health Maintenance Due  Topic Date Due  . PNA vac Low Risk Adult (2 of 2 - PCV13) 04/08/2017 (Originally 02/28/2017)  . INFLUENZA VACCINE  Completed  . DEXA SCAN  Completed   Fall Risk  01/26/2017  Falls in the past year? No   Functional Status Survey:    Vitals:   03/03/17 1139  BP: 120/68  Pulse: 68  Resp: 20  Temp: (!) 97.2 F (36.2 C)  TempSrc: Oral  SpO2: 96%    Physical Exam   In general this is a somewhat frail elderly female in no distress sitting comfortably in her wheelchair.  Skin is warm and dry left 4 head there is a small area of discoloration brawny appearing appears to be a resolving bruise there is no elevation to this or tenderness to palpation I do not see any evidence of cellulitis.  I could not see any concerning bruising elsewhere she does have some chronic bruising of her hands and arms with fragile skin.  Eyes pupils appear reactive to light sclera and conjunctiva are clear except with the movements intact visual acuity appears to be grossly intact.  Oropharynx is clear with his memory and moist.  Chest is clear to auscultation with poor respiratory effort there is no labored breathing.  Heart is distant heart sounds from what I could ascertain was slightly bradycardic in the 50s she does not have significant lower extremity edema.  Abdomen soft nontender with positive bowel sounds.  Muscle skeletal  continues with general frailty moves all extremities 4 .  Neurologic could not really appreciate any focal deficits she is alert cranial nerves appear to be intact.  Psych she has significant dementia has baseline mood and affect confused and not agitated with exam today  Labs reviewed:  Recent Labs  04/11/16 0700  06/02/16 0700  06/12/16 0546  10/04/16 0715 11/22/16 0700 02/24/17 0700  NA 139  < > 143  < > 143  < > 140 141 142  K 3.9  < > 3.6  < > 3.6  < > 4.0 4.0 3.7  CL 103  < > 105  < > 108  < > 105 103 104  CO2 27  < > 28  < > 25  < > 26 28 29   GLUCOSE 89  < > 101*  < > 104*  < > 91 84 84  BUN 40*  < >  37*  < > 36*  < > 52* 48* 48*  CREATININE 1.50*  < > 1.53*  < > 1.69*  < > 2.07* 1.98* 1.83*  CALCIUM 9.4  < > 9.6  < > 9.1  < > 9.5 9.6 9.4  MG 2.6*  --  2.6*  --   --   --   --   --   --   PHOS  --   --   --   --  2.3*  --   --   --   --   < > = values in this interval not displayed.  Recent Labs  05/25/16 0500 06/10/16 1300 06/10/16 1838 06/12/16 0546  AST 25 41 46*  --   ALT 19 41 51  --   ALKPHOS 70 77 75  --   BILITOT 0.7 0.5 0.4  --   PROT 7.3 8.4* 8.1  --   ALBUMIN 3.9 4.4 4.0 3.4*    Recent Labs  06/10/16 1300 06/10/16 1838 06/11/16 0550 06/15/16 0718 08/25/16 0700  WBC 9.3 9.3 8.5 9.4 5.2  NEUTROABS 8.3* 7.3  --   --  3.0  HGB 15.6* 14.2 14.3 13.1 12.6  HCT 49.8* 44.1 45.1 41.1 38.8  MCV 94.3 93.0 94.0 91.9 89.4  PLT 262 256 274 231 222   Lab Results  Component Value Date   TSH 0.749 06/10/2016   Lab Results  Component Value Date   HGBA1C  05/17/2007    5.4 (NOTE)   The ADA recommends the following therapeutic goals for glycemic   control related to Hgb A1C measurement:   Goal of Therapy:   < 7.0% Hgb A1C   Action Suggested:  > 8.0% Hgb A1C   Ref:  Diabetes Care, 22, Suppl. 1, 1999   Lab Results  Component Value Date   CHOL  05/22/2007    175        ATP III CLASSIFICATION:  <200     mg/dL   Desirable  200-239  mg/dL   Borderline  High  >=240    mg/dL   High   HDL 37 (L) 05/22/2007   LDLCALC  05/22/2007    96        Total Cholesterol/HDL:CHD Risk Coronary Heart Disease Risk Table                     Men   Women  1/2 Average Risk   3.4   3.3   TRIG 208 (H) 05/22/2007   CHOLHDL 4.7 05/22/2007    Significant Diagnostic Results in last 30 days:  No results found.  Assessment/Plan  #1 bruise left forehand at this point this appears resolving I do not see any bruising elsewhere which would indicate a more systemic issue-cranial nerves are intact she does not appear to be changed from baseline areas nontender to palpation-at this point will monitor she is not on aspirin or any anticoagulant.  #2 hypertension this appears stable on Norvasc blood pressure today 120/68.  #3 history of chronic kidney disease this appears relatively baseline on lab done on October 19 BUN was 48 creatinine 1.83 which appears baseline possibly slightly improved from previous level.  [id number]

## 2017-03-16 ENCOUNTER — Encounter: Payer: Self-pay | Admitting: Internal Medicine

## 2017-03-16 ENCOUNTER — Non-Acute Institutional Stay (SKILLED_NURSING_FACILITY): Payer: Medicare Other | Admitting: Internal Medicine

## 2017-03-16 DIAGNOSIS — F0281 Dementia in other diseases classified elsewhere with behavioral disturbance: Secondary | ICD-10-CM

## 2017-03-16 DIAGNOSIS — N289 Disorder of kidney and ureter, unspecified: Secondary | ICD-10-CM

## 2017-03-16 DIAGNOSIS — G309 Alzheimer's disease, unspecified: Secondary | ICD-10-CM | POA: Diagnosis not present

## 2017-03-16 DIAGNOSIS — I10 Essential (primary) hypertension: Secondary | ICD-10-CM

## 2017-03-16 DIAGNOSIS — K3184 Gastroparesis: Secondary | ICD-10-CM | POA: Diagnosis not present

## 2017-03-16 DIAGNOSIS — C50911 Malignant neoplasm of unspecified site of right female breast: Secondary | ICD-10-CM

## 2017-03-16 NOTE — Progress Notes (Signed)
Location:   [facility] Room Number: 110/W Place of Service:  SNF [phone]) Provider:  Gardiner Fanti, Rene Kocher, MD  Patient Care Team: [name], MD as PCP - General (Internal Medicine) [name], [name], MD as Consulting Physician (Gastroenterology)  Extended Emergency Contact Information Primary Emergency Contact: [name] Address: [address]          [place], [place] [address] [name] of [place] Phone: [phone] Work Phone: [phone] Mobile Phone: [phone] Relation: Daughter Secondary Emergency Contact: [name] Address: [address]          [place], [place] [address] [name] of [place] Phone: [phone] Mobile Phone: [phone] Relation: Relative  Code Status:  DNR Goals of care: Advanced Directive information Advanced Directives 03/16/2017  Does Patient Have a Medical Advance Directive? Yes  Type of Advance Directive Out of facility DNR (pink MOST or yellow form)  Does patient want to make changes to medical advance directive? No - Patient declined  Copy of [place] in Chart? No - copy requested  Would patient like information on creating a medical advance directive? No - Patient declined  Pre-existing out of facility DNR order (yellow form or pink MOST form) -     Chief Complaint  Patient presents with  . Medical Management of Chronic Issues    Routine Visit  for medical management of chrondical conditions including severe dementia-chronic kidney disease-hypertension-gastroparesis-hypokalemia-constipation-history of breast cancer status post mastectomy  HPI:  Pt is a 81 y.o. female seen today for medical management of chronic diseases.  As noted above-she continues to be stable her weight has stabilized at around 145 pounds she has gained about 10 pounds actually since the summer  -she does have a history gastroparesis Reglan was DC'd secondary to concerns of tardive  dyskinesia-she continues on around-the-clock Zofran as well as Prilosec and this actually appears to be stable withinfrequent episodes of vomiting which have apparently improved significantly and I suspect related to her weight gain.   In regards to dementia she does have a MOST form-she is doing well with supportive care and again appears to have gained weight-.  Levonne Lapping hypokalemia she is on potassium supplementation potassium was 3.7 on lab done last month-having conservative with ing labs secondary to family's wishes for conservative care.  She also has a history of breast cancer status post mastectomy is no longer on medication again secondary to wish is for medication minimization conservative care.  Regards chronic kidney disease this ihasbeen stable as well with a creatinine 1.83 BUN of 48 on lab done in October      Past Medical History:  Diagnosis Date  . Cancer ([facility])   . Dementia   . Gallstones   . Gastroparalysis   . HTN (hypertension)   . Infiltrating ductal carcinoma of right female breast ([facility]) 12/02/2009   Qualifier: History of  By: [name] CMA [name]), June    . Osteoporosis 11/18/2011  . Reflux   . Renal disorder    renal insufficiency.    Past Surgical History:  Procedure Laterality Date  . BREAST BIOPSY    . BREAST LUMPECTOMY    . COLONOSCOPY  2009   Dr. Fuller Plan: normal  . ESOPHAGOGASTRODUODENOSCOPY  12/11/2009   Dr. Fuller Plan: hiatal hernia  . TONSILLECTOMY AND ADENOIDECTOMY      Allergies  Allergen Reactions  . Aricept [Donepezil Hydrochloride] Nausea And Vomiting  . Exelon [Rivastigmine Tartrate]   . Lithium Nausea And Vomiting  Outpatient Encounter Medications as of 03/16/2017  Medication Sig  . acetaminophen (TYLENOL) 325 MG tablet Take one tablet by mouth at bedtime, take 650 mg by mouth every 4 hours prn  . amLODipine (NORVASC) 5 MG tablet Take 5 mg by mouth daily.  [name] docusate (COLACE) 50 MG/5ML liquid Take 100 mg by mouth daily.  [name] omeprazole  (PRILOSEC) 20 MG capsule Take 20 mg by mouth daily.  . ondansetron (ZOFRAN-ODT) 4 MG disintegrating tablet Dissolve 1 tablet in mouth 4 times daily before meals and at bedtime for nausea  . polyethylene glycol (MIRALAX / GLYCOLAX) packet Take 17 g by mouth daily.    . potassium chloride 20 MEQ/15ML (10%) SOLN Take 40 mEq by mouth daily.   [name] senna-docusate (SENOKOT-S) 8.6-50 MG tablet Take 2 tablets by mouth at bedtime.    No facility-administered encounter medications on file as of 03/16/2017.      Review of Systems   Unattainable secondary to dementia please see history of present illness where he  Immunization History  Administered Date(s) Administered  . Influenza-Unspecified 02/13/2014, 02/09/2016, 02/10/2017  . Pneumococcal Polysaccharide-23 02/29/2016  . Tdap 01/30/2017   Pertinent  Health Maintenance Due  Topic Date Due  . PNA vac Low Risk Adult (2 of 2 - PCV13) 04/08/2017 (Originally 02/28/2017)  . INFLUENZA VACCINE  Completed  . DEXA SCAN  Completed   Fall Risk  01/26/2017  Falls in the past year? No   Functional Status Survey:    Vitals:   03/16/17 1431  BP: (!) 159/87  Pulse: (!) 57  Resp: 18  Temp: 97.6 F (36.4 C)  TempSrc: Oral  SpO2: 98%  Weight: 145 lb 6.4 oz (66 kg)  Height: 5\' 7"  (1.702 m)    Physical Exam  In general this is a fairly well-nourished elderly female in no distress sitting comfortably in her wheelchair.  Her skin is warm and dry.  Head is normocephalic.  Eyes sclera and conjunctiva are clear visual acuity appears grossly intact pupils appear reactive pupil on the left appears slightly larger than the right.  Chest is clear to auscultation with poor respiratory effort she does not follow verbal commands.  Heart distant heart sounds regular rate and rhythm could not appreciate a murmur p or rub she does have scant lower extremity edema.  Abdomen is soft nontender with positive bowel sounds.  Musculoskeletal moves all extremities  appears at baseline ambulates in a wheelchair at times told her arms and is somewhat contracted position.  Neurologic appears grossly intact no true lateralizing findings her speech is nonsensical.  Psych--findings consistent with severe dementia somewhat agitated with exam  Labs reviewed: Recent Labs    04/11/16 0700  06/02/16 0700  06/12/16 0546  10/04/16 0715 11/22/16 0700 02/24/17 0700  NA 139   < > 143   < > 143   < > 140 141 142  K 3.9   < > 3.6   < > 3.6   < > 4.0 4.0 3.7  CL 103   < > 105   < > 108   < > 105 103 104  CO2 27   < > 28   < > 25   < > 26 28 29   GLUCOSE 89   < > 101*   < > 104*   < > 91 84 84  BUN 40*   < > 37*   < > 36*   < > 52* 48* 48*  CREATININE 1.50*   < >  1.53*   < > 1.69*   < > 2.07* 1.98* 1.83*  CALCIUM 9.4   < > 9.6   < > 9.1   < > 9.5 9.6 9.4  MG 2.6*  --  2.6*  --   --   --   --   --   --   PHOS  --   --   --   --  2.3*  --   --   --   --    < > = values in this interval not displayed.   Recent Labs    05/25/16 0500 06/10/16 1300 06/10/16 1838 06/12/16 0546  AST 25 41 46*  --   ALT 19 41 51  --   ALKPHOS 70 77 75  --   BILITOT 0.7 0.5 0.4  --   PROT 7.3 8.4* 8.1  --   ALBUMIN 3.9 4.4 4.0 3.4*   Recent Labs    06/10/16 1300 06/10/16 1838 06/11/16 0550 06/15/16 0718 08/25/16 0700  WBC 9.3 9.3 8.5 9.4 5.2  NEUTROABS 8.3* 7.3  --   --  3.0  HGB 15.6* 14.2 14.3 13.1 12.6  HCT 49.8* 44.1 45.1 41.1 38.8  MCV 94.3 93.0 94.0 91.9 89.4  PLT 262 256 274 231 222   Lab Results  Component Value Date   TSH 0.749 06/10/2016   Lab Results  Component Value Date   HGBA1C  05/17/2007    5.4 (NOTE)   The ADA recommends the following therapeutic goals for glycemic   control related to Hgb A1C measurement:   Goal of Therapy:   < 7.0% Hgb A1C   Action Suggested:  > 8.0% Hgb A1C   Ref:  Diabetes Care, 22, Suppl. 1, 1999   Lab Results  Component Value Date   CHOL  05/22/2007    175        ATP III CLASSIFICATION:  <200     mg/dL   Desirable   200-239  mg/dL   Borderline High  >=240    mg/dL   High   HDL 37 (L) 05/22/2007   LDLCALC  05/22/2007    96        Total Cholesterol/HDL:CHD Risk Coronary Heart Disease Risk Table                     Men   Women  1/2 Average Risk   3.4   3.3   TRIG 208 (H) 05/22/2007   CHOLHDL 4.7 05/22/2007    Significant Diagnostic Results in last 30 days:  No results found.  Assessment/Plan  #1 dementia-this appears to be quite severe but she is doing well with supportive care--actually has gained weight  Recently which is encouraging.  #2history of gastroparesis-she continues on Zofran as well as Prilosec is appears to be stable and improved -- Reglan has been discontinued nonetheless this continues to be stable  I suspect this actually has contributed to her weight gain as well with reduced episodes of vomiting.  #3 history of chronic kidney disease as noted above this appears relatively stable creatinine 1.8 BUN of 48 on lab in October we haveminimal lab draws secondary to family wishes for minimal interventions.   #4hypokalemia potassium is been stable now for some timeat 3.7 again minimal lab draws as noted above.  #5-history of breast cancer status post mastectomy-medication has been discontinued secondary to family wishes for minimal meds  with conservative follow-up.  #6 history of hypertension she is on  Norvasc generally this is been stableblood pressure today was somewhat elevated at 159/87-- I got a similar reading when I checked it manually --I suspect agitation at times contribute to this--she does hold her arm at times in contracted position when she is agitated which I suspect contributes to the higher readings   [phone]

## 2017-03-23 ENCOUNTER — Non-Acute Institutional Stay (SKILLED_NURSING_FACILITY): Payer: Medicare Other | Admitting: Internal Medicine

## 2017-03-23 ENCOUNTER — Encounter: Payer: Self-pay | Admitting: Internal Medicine

## 2017-03-23 DIAGNOSIS — G309 Alzheimer's disease, unspecified: Secondary | ICD-10-CM | POA: Diagnosis not present

## 2017-03-23 DIAGNOSIS — I10 Essential (primary) hypertension: Secondary | ICD-10-CM

## 2017-03-23 DIAGNOSIS — F0281 Dementia in other diseases classified elsewhere with behavioral disturbance: Secondary | ICD-10-CM | POA: Diagnosis not present

## 2017-03-23 DIAGNOSIS — H1011 Acute atopic conjunctivitis, right eye: Secondary | ICD-10-CM

## 2017-03-23 DIAGNOSIS — K3184 Gastroparesis: Secondary | ICD-10-CM | POA: Diagnosis not present

## 2017-03-23 NOTE — Progress Notes (Signed)
Location:  [facility] of Service:  SNF [phone]) Provider: Veleta Miners MD  [name], MD  Patient Care Team: [name], MD as PCP - General (Internal Medicine) [name] [name], MD as Consulting Physician (Gastroenterology)  Extended Emergency Contact Information Primary Emergency Contact: [facility] Address: [address]          [place], [place] [address] [name] of [place] Phone: [phone] Work Phone: [phone] Mobile Phone: [phone] Relation: Daughter Secondary Emergency Contact: [name] Address: [address]          [place], [place] [address] [name] of [place] Phone: [phone] Mobile Phone: [phone] Relation: Relative  Code Status:  DNR  Goals of care: Advanced Directive information Advanced Directives 03/16/2017  Does Patient Have a Medical Advance Directive? Yes  Type of Advance Directive Out of facility DNR (pink MOST or yellow form)  Does patient want to make changes to medical advance directive? No - Patient declined  Copy of [place] in Chart? No - copy requested  Would patient like information on creating a medical advance directive? No - Patient declined  Pre-existing out of facility DNR order (yellow form or pink MOST form) -     Chief Complaint  Patient presents with  . Acute Visit    HPI:  Pt is a 81 y.o. female seen today for an acute visit for Redness of her Eye.  Patient has h/o hypertension, Advanced Dementia, Chronic renal disease, Breast cancer s/p Mastectomy, GERD and Gastroparesis.  Patients daughter and Nurses wanted her to be seen for Redness of her eye with ? Discharge in the morning . They have also noticed her rubbing her eyes. She was treated with Antihistamines eye drops before and done well. Patient unable to give any history due to her dementia Past Medical History:  Diagnosis Date  . Cancer ([place])   . Dementia   . Gallstones   . Gastroparalysis    . HTN (hypertension)   . Infiltrating ductal carcinoma of right female breast ([place]) 12/02/2009   Qualifier: History of  By: [name] CMA [name]), June    . Osteoporosis 11/18/2011  . Reflux   . Renal disorder    renal insufficiency.    Past Surgical History:  Procedure Laterality Date  . BREAST BIOPSY    . BREAST LUMPECTOMY    . COLONOSCOPY  2009   Dr. Fuller Plan: normal  . ESOPHAGOGASTRODUODENOSCOPY  12/11/2009   Dr. Fuller Plan: hiatal hernia  . ESOPHAGOGASTRODUODENOSCOPY N/A 01/29/2014   Procedure: ESOPHAGOGASTRODUODENOSCOPY (EGD);  Surgeon: [name], MD;  [facility];  Service: Endoscopy;  Laterality: N/A;  245  . TONSILLECTOMY AND ADENOIDECTOMY      Allergies  Allergen Reactions  . Aricept [Donepezil Hydrochloride] Nausea And Vomiting  . Exelon [Rivastigmine Tartrate]   . Lithium Nausea And Vomiting    Outpatient Encounter Medications as of 03/23/2017  Medication Sig  . acetaminophen (TYLENOL) 325 MG tablet Take one tablet by mouth at bedtime, take 650 mg by mouth every 4 hours prn  . amLODipine (NORVASC) 5 MG tablet Take 5 mg by mouth daily.  [name] docusate (COLACE) 50 MG/5ML liquid Take 100 mg by mouth daily.  [name] omeprazole (PRILOSEC) 20 MG capsule Take 20 mg by mouth daily.  . ondansetron (ZOFRAN-ODT) 4 MG disintegrating tablet Dissolve 1 tablet in mouth 4 times daily before meals and at bedtime for nausea  . polyethylene glycol (MIRALAX / GLYCOLAX) packet Take 17 g by  mouth daily.    . potassium chloride 20 MEQ/15ML (10%) SOLN Take 40 mEq by mouth daily.   [name] senna-docusate (SENOKOT-S) 8.6-50 MG tablet Take 2 tablets by mouth at bedtime.    No facility-administered encounter medications on file as of 03/23/2017.     Review of Systems  Unable to perform ROS: Dementia    Immunization History  Administered Date(s) Administered  . Influenza-Unspecified 02/13/2014, 02/09/2016, 02/10/2017  . Pneumococcal Polysaccharide-23 02/29/2016  . Tdap 01/30/2017    Pertinent  Health Maintenance Due  Topic Date Due  . PNA vac Low Risk Adult (2 of 2 - PCV13) 04/08/2017 (Originally 02/28/2017)  . INFLUENZA VACCINE  Completed  . DEXA SCAN  Completed   Fall Risk  01/26/2017  Falls in the past year? No   Functional Status Survey:    Vitals:   03/23/17 1355  BP: 126/72  Pulse: (!) 56  Resp: 16  Temp: 98.2 F (36.8 C)   There is no height or weight on file to calculate BMI. Physical Exam  Constitutional: She appears well-developed and well-nourished.  HENT:  Head: Normocephalic.  Mouth/Throat: Oropharynx is clear and moist.  Eyes:  I did not see any redness or discharge from her eyes.  Neck: Neck supple.  Cardiovascular: Normal rate and normal heart sounds.  Pulmonary/Chest: Effort normal and breath sounds normal. No respiratory distress. She has no wheezes. She has no rales.  Abdominal: Soft. Bowel sounds are normal. She exhibits no distension. There is no tenderness. There is no rebound.  Musculoskeletal: She exhibits no edema.  Neurological: She is alert.  Skin: Skin is warm and dry.    Labs reviewed: Recent Labs    04/11/16 0700  06/02/16 0700  06/12/16 0546  10/04/16 0715 11/22/16 0700 02/24/17 0700  NA 139   < > 143   < > 143   < > 140 141 142  K 3.9   < > 3.6   < > 3.6   < > 4.0 4.0 3.7  CL 103   < > 105   < > 108   < > 105 103 104  CO2 27   < > 28   < > 25   < > 26 28 29   GLUCOSE 89   < > 101*   < > 104*   < > 91 84 84  BUN 40*   < > 37*   < > 36*   < > 52* 48* 48*  CREATININE 1.50*   < > 1.53*   < > 1.69*   < > 2.07* 1.98* 1.83*  CALCIUM 9.4   < > 9.6   < > 9.1   < > 9.5 9.6 9.4  MG 2.6*  --  2.6*  --   --   --   --   --   --   PHOS  --   --   --   --  2.3*  --   --   --   --    < > = values in this interval not displayed.   Recent Labs    05/25/16 0500 06/10/16 1300 06/10/16 1838 06/12/16 0546  AST 25 41 46*  --   ALT 19 41 51  --   ALKPHOS 70 77 75  --   BILITOT 0.7 0.5 0.4  --   PROT 7.3 8.4* 8.1  --    ALBUMIN 3.9 4.4 4.0 3.4*   Recent Labs    06/10/16 1300 06/10/16 1838  06/11/16 0550 06/15/16 0718 08/25/16 0700  WBC 9.3 9.3 8.5 9.4 5.2  NEUTROABS 8.3* 7.3  --   --  3.0  HGB 15.6* 14.2 14.3 13.1 12.6  HCT 49.8* 44.1 45.1 41.1 38.8  MCV 94.3 93.0 94.0 91.9 89.4  PLT 262 256 274 231 222   Lab Results  Component Value Date   TSH 0.749 06/10/2016   Lab Results  Component Value Date   HGBA1C  05/17/2007    5.4 (NOTE)   The ADA recommends the following therapeutic goals for glycemic   control related to Hgb A1C measurement:   Goal of Therapy:   < 7.0% Hgb A1C   Action Suggested:  > 8.0% Hgb A1C   Ref:  Diabetes Care, 22, Suppl. 1, 1999   Lab Results  Component Value Date   CHOL  05/22/2007    175        ATP III CLASSIFICATION:  <200     mg/dL   Desirable  200-239  mg/dL   Borderline High  >=240    mg/dL   High   HDL 37 (L) 05/22/2007   LDLCALC  05/22/2007    96        Total Cholesterol/HDL:CHD Risk Coronary Heart Disease Risk Table                     Men   Women  1/2 Average Risk   3.4   3.3   TRIG 208 (H) 05/22/2007   CHOLHDL 4.7 05/22/2007    Significant Diagnostic Results in last 30 days:  No results found.  Assessment/Plan Allergic Conjunctivitis Will start her again on pentanol.for 1 week and then Prn . Also Will try Artifical Tears after a week to see if it helps with her Symptoms.  Gastroparesis Her Reglan was discontinued But her weight is stable 145 lbs. She continues on Zofran and prilosec. Gastroesophageal reflux disease  Continue on Omeprazole  Essential hypertension Stable on Norvasc Chronic renal insufficiency stage 4 Stable Creat  Alzheimer's dementia Stable. Continue Supportive care Has MOST form in the Chart  Family/ staff Communication:   Labs/tests ordered:   Total time spent in this patient care encounter was 25_ minutes; greater than 50% of the visit spent counseling patient, reviewing records , Labs and coordinating care  for problems addressed at this encounter.

## 2017-04-10 ENCOUNTER — Encounter: Payer: Self-pay | Admitting: Internal Medicine

## 2017-04-10 ENCOUNTER — Non-Acute Institutional Stay (SKILLED_NURSING_FACILITY): Payer: Medicare Other | Admitting: Internal Medicine

## 2017-04-10 ENCOUNTER — Encounter (HOSPITAL_COMMUNITY)
Admission: RE | Admit: 2017-04-10 | Discharge: 2017-04-10 | Disposition: A | Discharge status: Home / Self Care | Payer: Medicare Other | Source: Skilled Nursing Facility | Attending: Internal Medicine | Admitting: Internal Medicine

## 2017-04-10 DIAGNOSIS — G309 Alzheimer's disease, unspecified: Secondary | ICD-10-CM

## 2017-04-10 DIAGNOSIS — R5381 Other malaise: Secondary | ICD-10-CM | POA: Diagnosis not present

## 2017-04-10 DIAGNOSIS — D72829 Elevated white blood cell count, unspecified: Secondary | ICD-10-CM | POA: Diagnosis not present

## 2017-04-10 DIAGNOSIS — K3184 Gastroparesis: Secondary | ICD-10-CM | POA: Diagnosis not present

## 2017-04-10 DIAGNOSIS — F0281 Dementia in other diseases classified elsewhere with behavioral disturbance: Secondary | ICD-10-CM | POA: Diagnosis not present

## 2017-04-10 LAB — COMPREHENSIVE METABOLIC PANEL
ALBUMIN: 4.4 g/dL (ref 3.5–5.0)
ALK PHOS: 87 U/L (ref 38–126)
ALT: 68 U/L — ABNORMAL HIGH (ref 14–54)
AST: 60 U/L — ABNORMAL HIGH (ref 15–41)
Anion gap: 10 (ref 5–15)
BUN: 58 mg/dL — ABNORMAL HIGH (ref 6–20)
CO2: 26 mmol/L (ref 22–32)
Calcium: 9.9 mg/dL (ref 8.9–10.3)
Chloride: 110 mmol/L (ref 101–111)
Creatinine, Ser: 2.36 mg/dL — ABNORMAL HIGH (ref 0.44–1.00)
GFR calc Af Amer: 21 mL/min — ABNORMAL LOW (ref >60)
GFR calc non Af Amer: 18 mL/min — ABNORMAL LOW (ref >60)
GLUCOSE: 170 mg/dL — AB (ref 65–99)
POTASSIUM: 4.2 mmol/L (ref 3.5–5.1)
Sodium: 146 mmol/L — ABNORMAL HIGH (ref 135–145)
Total Bilirubin: 0.5 mg/dL (ref 0.3–1.2)
Total Protein: 8.4 g/dL — ABNORMAL HIGH (ref 6.5–8.1)

## 2017-04-10 LAB — CBC WITH DIFFERENTIAL/PLATELET
BASOS ABS: 0.1 10*3/uL (ref 0.0–0.1)
BASOS PCT: 0 %
Eosinophils Absolute: 0 10*3/uL (ref 0.0–0.7)
Eosinophils Relative: 0 %
HEMATOCRIT: 46.1 % — AB (ref 36.0–46.0)
HEMOGLOBIN: 14 g/dL (ref 12.0–15.0)
LYMPHS PCT: 11 %
Lymphs Abs: 1.3 10*3/uL (ref 0.7–4.0)
MCH: 28.8 pg (ref 26.0–34.0)
MCHC: 30.4 g/dL (ref 30.0–36.0)
MCV: 94.9 fL (ref 78.0–100.0)
Monocytes Absolute: 0.7 10*3/uL (ref 0.1–1.0)
Monocytes Relative: 6 %
NEUTROS ABS: 10.3 10*3/uL — AB (ref 1.7–7.7)
NEUTROS PCT: 83 %
Platelets: 244 10*3/uL (ref 150–400)
RBC: 4.86 MIL/uL (ref 3.87–5.11)
RDW: 14.9 % (ref 11.5–15.5)
WBC: 12.4 10*3/uL — ABNORMAL HIGH (ref 4.0–10.5)

## 2017-04-10 LAB — TSH: TSH: 0.823 u[IU]/mL (ref 0.350–4.500)

## 2017-04-10 NOTE — Progress Notes (Signed)
This encounter was created in error - please disregard.

## 2017-04-10 NOTE — Progress Notes (Signed)
Location:   [facility] Room Number: 110/W Place of Service:  SNF [phone]) Provider:  Freddi Starr, MD  Patient Care Team: [name], MD as PCP - General (Internal Medicine) [name], [name], MD as Consulting Physician (Gastroenterology)  Extended Emergency Contact Information Primary Emergency Contact: [name] Address: [address]          [place], [place] [address] [name] of [place] Phone: [phone] Work Phone: [phone] Mobile Phone: [phone] Relation: Daughter Secondary Emergency Contact: [name] Address: [address]          [place], [place] [address] [name] of [place] Phone: [phone] Mobile Phone: [phone] Relation: Relative  Code Status:  DNR Goals of care: Advanced Directive information Advanced Directives 04/10/2017  Does Patient Have a Medical Advance Directive? Yes  Type of Advance Directive Out of facility DNR (pink MOST or yellow form)  Does patient want to make changes to medical advance directive? No - Patient declined  Copy of [place] in Chart? No - copy requested  Would patient like information on creating a medical advance directive? No - Patient declined  Pre-existing out of facility DNR order (yellow form or pink MOST form) -     Chief complaint-acute visit secondary to change in status patient appears not to feel well  HPI:  Pt is a 81 y.o. female seen today for an acute visit for a change in status-apparently she vomited once this morning and does not appear to be feeling well.  Patient has a history of advanced dementia as well as hypertension and chronic renal disease as well as breast cancer status post mastectomy GERD and gastroparesis.  Her daughter has wished essentially comfort measures-with occasional labs-.  She does have a history of significant gastroparesis continues on Zofran 4 times a day as well as a proton pump inhibitor which  appears to be helping.  Apparently she did vomit some this morning-she is currently sitting in her wheelchair somewhat bent over and does not appear to be feeling well.  She is a poor historian and is not really speaking today blood pressure is mildly elevated from baseline and she does have a low-grade fever as well     Past Medical History:  Diagnosis Date  . Cancer ([facility] [facility])   . Dementia   . Gallstones   . Gastroparalysis   . HTN (hypertension)   . Infiltrating ductal carcinoma of right female breast ([place]) 12/02/2009   Qualifier: History of  By: [name] CMA [name]), June    . Osteoporosis 11/18/2011  . Reflux   . Renal disorder    renal insufficiency.    Past Surgical History:  Procedure Laterality Date  . BREAST BIOPSY    . BREAST LUMPECTOMY    . COLONOSCOPY  2009   Dr. Fuller Plan: normal  . ESOPHAGOGASTRODUODENOSCOPY  12/11/2009   Dr. Fuller Plan: hiatal hernia  . ESOPHAGOGASTRODUODENOSCOPY N/A 01/29/2014   Procedure: ESOPHAGOGASTRODUODENOSCOPY (EGD);  Surgeon: [name], MD;  [facility];  Service: Endoscopy;  Laterality: N/A;  245  . TONSILLECTOMY AND ADENOIDECTOMY      Allergies  Allergen Reactions  . Aricept [Donepezil Hydrochloride] Nausea And Vomiting  . Exelon [Rivastigmine Tartrate]   . Lithium Nausea And Vomiting    Outpatient Encounter Medications as of 04/10/2017  Medication Sig  . acetaminophen (TYLENOL) 325 MG tablet Take one tablet by mouth at bedtime, take 650 mg by mouth every 4 hours prn  .  amLODipine (NORVASC) 5 MG tablet Take 5 mg by mouth daily.  [name] docusate (COLACE) 50 MG/5ML liquid Take 100 mg by mouth daily.  . Olopatadine HCl 0.2 % SOLN Place 1 drop into both eyes daily as needed.  [name] omeprazole (PRILOSEC) 20 MG capsule Take 20 mg by mouth daily.  . ondansetron (ZOFRAN-ODT) 4 MG disintegrating tablet Dissolve 1 tablet in mouth 3 times daily before meals and at bedtime for nausea  . polyethylene glycol (MIRALAX / GLYCOLAX) packet Take 17 g by  mouth daily.    . potassium chloride 20 MEQ/15ML (10%) SOLN Take 40 mEq by mouth daily.   [name] senna-docusate (SENOKOT-S) 8.6-50 MG tablet Take 2 tablets by mouth at bedtime.    No facility-administered encounter medications on file as of 04/10/2017.     Review of Systems   Is essentially unobtainable secondary to dementia-please see HPI  Immunization History  Administered Date(s) Administered  . Influenza-Unspecified 02/13/2014, 02/09/2016, 02/10/2017  . Pneumococcal Polysaccharide-23 02/29/2016  . Tdap 01/30/2017   Pertinent  Health Maintenance Due  Topic Date Due  . PNA vac Low Risk Adult (2 of 2 - PCV13) 02/28/2017  . INFLUENZA VACCINE  Completed  . DEXA SCAN  Completed   Fall Risk  01/26/2017  Falls in the past year? No   Functional Status Survey:    Vitals:   04/10/17 1125  BP: (!) 150/90  Pulse: 86  Resp: 18  Temp: 99.6 F (37.6 C)  TempSrc: Oral  SpO2: 96%    Physical Exam   In general this is a frail-appearing elderly female sitting in her wheelchair she is bent over somewhat which is somewhat typical for her but does not appear to be feeling well.  Her skin is warm and dry she is not diaphoretic.  Eyes she did not really open her eyes closed and quite tightly could not see any exudate or discharge.  Oropharynx she would not really open her mouth.  Chest poor respiratory effort does not follow verbal commands but could not appreciate any congestion there is no labored breathing.  Heart is regular rate and rhythm without murmur gallop or rub.  Abdomen is soft does not appear to be tender although again patient is difficult to fully assess secondary to dementia-she does have positive bowel sounds.  Musculoskeletal appears to be at baseline is able to move her extremities does hold her arms in somewhat contracted position this morning neurologic  But could not really appreciate any lateralizing findings cranial nerves appear to be intact again she would not  open her eyes or mouth but this is not really a new presentation.  Psych again findings consistent with advanced dementia she does not really follow verbal commands    Labs reviewed: Recent Labs    04/11/16 0700  06/02/16 0700  06/12/16 0546  10/04/16 0715 11/22/16 0700 02/24/17 0700  NA 139   < > 143   < > 143   < > 140 141 142  K 3.9   < > 3.6   < > 3.6   < > 4.0 4.0 3.7  CL 103   < > 105   < > 108   < > 105 103 104  CO2 27   < > 28   < > 25   < > 26 28 29   GLUCOSE 89   < > 101*   < > 104*   < > 91 84 84  BUN 40*   < > 37*   < >  36*   < > 52* 48* 48*  CREATININE 1.50*   < > 1.53*   < > 1.69*   < > 2.07* 1.98* 1.83*  CALCIUM 9.4   < > 9.6   < > 9.1   < > 9.5 9.6 9.4  MG 2.6*  --  2.6*  --   --   --   --   --   --   PHOS  --   --   --   --  2.3*  --   --   --   --    < > = values in this interval not displayed.   Recent Labs    05/25/16 0500 06/10/16 1300 06/10/16 1838 06/12/16 0546  AST 25 41 46*  --   ALT 19 41 51  --   ALKPHOS 70 77 75  --   BILITOT 0.7 0.5 0.4  --   PROT 7.3 8.4* 8.1  --   ALBUMIN 3.9 4.4 4.0 3.4*   Recent Labs    06/10/16 1300 06/10/16 1838 06/11/16 0550 06/15/16 0718 08/25/16 0700  WBC 9.3 9.3 8.5 9.4 5.2  NEUTROABS 8.3* 7.3  --   --  3.0  HGB 15.6* 14.2 14.3 13.1 12.6  HCT 49.8* 44.1 45.1 41.1 38.8  MCV 94.3 93.0 94.0 91.9 89.4  PLT 262 256 274 231 222   Lab Results  Component Value Date   TSH 0.749 06/10/2016   Lab Results  Component Value Date   HGBA1C  05/17/2007    5.4 (NOTE)   The ADA recommends the following therapeutic goals for glycemic   control related to Hgb A1C measurement:   Goal of Therapy:   < 7.0% Hgb A1C   Action Suggested:  > 8.0% Hgb A1C   Ref:  Diabetes Care, 22, Suppl. 1, 1999   Lab Results  Component Value Date   CHOL  05/22/2007    175        ATP III CLASSIFICATION:  <200     mg/dL   Desirable  200-239  mg/dL   Borderline High  >=240    mg/dL   High   HDL 37 (L) 05/22/2007   LDLCALC  05/22/2007     96        Total Cholesterol/HDL:CHD Risk Coronary Heart Disease Risk Table                     Men   Women  1/2 Average Risk   3.4   3.3   TRIG 208 (H) 05/22/2007   CHOLHDL 4.7 05/22/2007    Significant Diagnostic Results in last 30 days:  No results found.  Assessment/Plan  #1-malaise with low-grade temperature- since patient has significant dementia this is difficult to pinpoint- her daughter does request fairly nonaggressive comfort measures-I did speak with her however and she is okay with ordering a CBC with differential and a metabolic panel-also will order a chest x-ray  Daughter would prefer urine not be done at this time since this is somewhat invasive and uncomfortable for mother-.  At this point will obtain the lab work as well as a chest x-ray and monitor vital signs every 4 hours. --  Addendum-we have obtain the updated lab work which shows a mildly elevated white count of 12,400 with neutrophil absolute of 10.3- hemoglobin is stable at 14 platelets stable at 244.  Metabolic panel shows minimal elevation of sodium at 146 creatinine has risen somewhat at 2.36 with a  BUN of 58- AST and ALT are mildly elevated AST of 60 ALT of 68.  Chest x-ray also has come back showing no acute process.  I did reevaluate patient later in the day she appeared to be somewhat more comfortable in bed but still did not appear to be feeling well- vital signs did show a blood pressure of 160/96-pulse was in the 80s continues with a low-grade elevated temperature between 99 and 100. Physical exam was relatively unchanged although she did appear a bit more comfortable in bed   I did discuss possibility of obtaining a urine with patient's daughter as well as with Dr.Gupta--at this point per discussion will monitor overnight and possibly obtain a urine culture in the morning if okay with her daughter.  Meantime clinically watch patient-daughter would prefer no IV secondary to patient comfort-again  will await clinical status tomorrow and monitor in the meantime.  [id number] note greater than 40 minutes spent assessing patient-reassessing patient-discussing her status with her daughter via phone initially and then at bedside later in the day- and coordinating plan of care-of note greater than 50% of time spent coordinating a plan of care with input as noted above

## 2017-04-11 ENCOUNTER — Encounter (HOSPITAL_COMMUNITY)
Admission: RE | Admit: 2017-04-11 | Discharge: 2017-04-11 | Disposition: A | Discharge status: Home / Self Care | Payer: Medicare Other | Source: Skilled Nursing Facility | Attending: *Deleted | Admitting: *Deleted

## 2017-04-11 ENCOUNTER — Non-Acute Institutional Stay (SKILLED_NURSING_FACILITY): Payer: Medicare Other | Admitting: Internal Medicine

## 2017-04-11 ENCOUNTER — Encounter: Payer: Self-pay | Admitting: Internal Medicine

## 2017-04-11 DIAGNOSIS — D72829 Elevated white blood cell count, unspecified: Secondary | ICD-10-CM | POA: Diagnosis not present

## 2017-04-11 DIAGNOSIS — I10 Essential (primary) hypertension: Secondary | ICD-10-CM | POA: Diagnosis not present

## 2017-04-11 DIAGNOSIS — R5381 Other malaise: Secondary | ICD-10-CM | POA: Diagnosis not present

## 2017-04-11 DIAGNOSIS — K3184 Gastroparesis: Secondary | ICD-10-CM | POA: Diagnosis not present

## 2017-04-11 LAB — URINALYSIS, ROUTINE W REFLEX MICROSCOPIC
Bilirubin Urine: NEGATIVE
Glucose, UA: NEGATIVE mg/dL
Ketones, ur: NEGATIVE mg/dL
Nitrite: NEGATIVE
Protein, ur: 100 mg/dL — AB
Specific Gravity, Urine: 1.01 (ref 1.005–1.030)
pH: 7 (ref 5.0–8.0)

## 2017-04-11 LAB — URINALYSIS, MICROSCOPIC (REFLEX)

## 2017-04-11 NOTE — Progress Notes (Signed)
Location:   [facility] Room Number: 110/W Place of Service:  SNF [phone]) Provider:  Freddi Starr, MD  Patient Care Team: [name], MD as PCP - General (Internal Medicine) [name], [name], MD as Consulting Physician (Gastroenterology)  Extended Emergency Contact Information Primary Emergency Contact: [name] Address: [address]          [place], [place] [address] [name] of [place] Phone: [phone] Work Phone: [phone] Mobile Phone: [phone] Relation: Daughter Secondary Emergency Contact: [name] Address: [address]          [place], [place] [address] [name] of [place] Phone: [phone] Mobile Phone: [phone] Relation: Relative  Code Status:  DNR Goals of care: Advanced Directive information Advanced Directives 04/11/2017  Does Patient Have a Medical Advance Directive? Yes  Type of Advance Directive Out of facility DNR (pink MOST or yellow form)  Does patient want to make changes to medical advance directive? No - Patient declined  Copy of [place] in Chart? No - copy requested  Would patient like information on creating a medical advance directive? No - Patient declined  Pre-existing out of facility DNR order (yellow form or pink MOST form) -     Chief complaint-acute visit to follow-up malaise with elevated white count  HPI:  Pt is a 81 y.o. female seen today for an acute visit for follow-up of malaise with an elevated white count.  Patient is a long-term resident of the facility and has a history of advanced dementia as well as hypertension chronic renal disease breast cancer status post mastectomy as well as GERD and gastroparesis.  She is on largely comfort care with no aggressive measures- only aggressive measures if her daughter approves.  Apparently yesterday she was somewhat lethargic with malaise looks like she was not feeling well-we ordered lab work  which came back showing a mildly elevated white count of 12,400--metabolic panel showed her sodium was increasing somewhat at 146 creatinine was 2.36 which is a bit above her baseline with a BUN of 58.  We also ordered a chest x-ray which was negative for any acute process.  Later yesterday patient was reevaluated and appeared to be looking a bit better although still looks somewhat sick and fatigued-I discussed her status with her daughter-we did discuss possibly getting a urinalysis urine culture depending on how she looked today.  Today she appears to be possibly slightly improved but still does not look like she feels well-apparently she did have decent p.o. intake with strong encouragement by her daughter this morning.  There is been no vomiting episode she did have a vomiting episode yesterday morning.  She does have a history of gastroparesis and is on Zofran routinely so the vomiting is not a totally new presentation although  had improved significant it appears on Zofran  Today she still has a low-grade temperature of 94 blood pressure is somewhat elevated at 160/96.  Regards blood pressure is on Norvasc 5 mg a day.  Essentially unobtainable secondary to dementia   Past Medical History:  Diagnosis Date  . Cancer ([place])   . Dementia   . Gallstones   . Gastroparalysis   . HTN (hypertension)   . Infiltrating ductal carcinoma of right female breast ([facility] [facility]) 12/02/2009   Qualifier: History of  By: [name] CMA [name]), June    . Osteoporosis 11/18/2011  . Reflux   . Renal disorder    renal insufficiency.  Past Surgical History:  Procedure Laterality Date  . BREAST BIOPSY    . BREAST LUMPECTOMY    . COLONOSCOPY  2009   Dr. Fuller Plan: normal  . ESOPHAGOGASTRODUODENOSCOPY  12/11/2009   Dr. Fuller Plan: hiatal hernia  . ESOPHAGOGASTRODUODENOSCOPY N/A 01/29/2014   Procedure: ESOPHAGOGASTRODUODENOSCOPY (EGD);  Surgeon: [name], MD;  [facility];  Service: Endoscopy;   Laterality: N/A;  245  . TONSILLECTOMY AND ADENOIDECTOMY      Allergies  Allergen Reactions  . Aricept [Donepezil Hydrochloride] Nausea And Vomiting  . Exelon [Rivastigmine Tartrate]   . Lithium Nausea And Vomiting    Outpatient Encounter Medications as of 04/11/2017  Medication Sig  . acetaminophen (TYLENOL) 325 MG tablet Take one tablet by mouth at bedtime, take 650 mg by mouth every 4 hours prn  . amLODipine (NORVASC) 5 MG tablet Take 5 mg by mouth daily.  [name] docusate (COLACE) 50 MG/5ML liquid Take 100 mg by mouth daily.  . Olopatadine HCl 0.2 % SOLN Place 1 drop into both eyes daily as needed.  [name] omeprazole (PRILOSEC) 20 MG capsule Take 20 mg by mouth daily.  . ondansetron (ZOFRAN-ODT) 4 MG disintegrating tablet Dissolve 1 tablet in mouth 3 times daily before meals and at bedtime for nausea  . polyethylene glycol (MIRALAX / GLYCOLAX) packet Take 17 g by mouth daily.    . potassium chloride 20 MEQ/15ML (10%) SOLN Take 40 mEq by mouth daily.   [name] senna-docusate (SENOKOT-S) 8.6-50 MG tablet Take 2 tablets by mouth at bedtime.    No facility-administered encounter medications on file as of 04/11/2017.     Review of Systems  Essentially unobtainable secondary to dementia please see HPI   Immunization History  Administered Date(s) Administered  . Influenza-Unspecified 02/13/2014, 02/09/2016, 02/10/2017  . Pneumococcal Conjugate-13 02/10/2017  . Pneumococcal Polysaccharide-23 02/29/2016  . Tdap 01/30/2017   Pertinent  Health Maintenance Due  Topic Date Due  . INFLUENZA VACCINE  Completed  . DEXA SCAN  Completed  . PNA vac Low Risk Adult  Completed   Fall Risk  01/26/2017  Falls in the past year? No   Functional Status Survey:    Vitals:   04/11/17 1149  BP: (!) 174/95  Pulse: 85  Resp: 20  Temp: 99.4 F (37.4 C)  TempSrc: Oral  SpO2: 95%  Of note   manual blood pressure was 160/96 which is comparable with last night's blood pressure  Physical Exam  In general this  is a somewhat frail-appearing elderly female she appears a bit stronger than she did yesterday but still is quite weak and fatigued looking.  Her skin is somewhat increased warmth to touch.  Eyes visual acuity appears grossly intact sclera and conjunctive are clear.  Oropharynx is clear mucous membranes moist.  Chest is clear to auscultation with poor respiratory effort not really following verbal commands there is no labored breathing could not really appreciate any overt congestion.  Heart is regular rate and rhythm without murmur gallop or rub she has minimal lower extremity edema.  Abdomen is soft does not appear to be tender there is no grimacing when her abdomen is palpated bowel sounds are quite active.  GU could not really appreciate suprapubic tenderness or distention.  Musculoskeletal is able to move all her extremities somewhat limited exam since she is in bed but I did not really note any changes from baseline.  Neurologic she is alert makes eye contact again not really speaking but this appears to be baseline could  not appreciate lateralizing findings cranial nerves appear to be intact.  Psych again findings consistent with advanced dementia does not really follow verbal commands but is not agitated with exam   Labs reviewed: Recent Labs    06/02/16 0700  06/12/16 0546  11/22/16 0700 02/24/17 0700 04/10/17 1200  NA 143   < > 143   < > 141 142 146*  K 3.6   < > 3.6   < > 4.0 3.7 4.2  CL 105   < > 108   < > 103 104 110  CO2 28   < > 25   < > 28 29 26   GLUCOSE 101*   < > 104*   < > 84 84 170*  BUN 37*   < > 36*   < > 48* 48* 58*  CREATININE 1.53*   < > 1.69*   < > 1.98* 1.83* 2.36*  CALCIUM 9.6   < > 9.1   < > 9.6 9.4 9.9  MG 2.6*  --   --   --   --   --   --   PHOS  --   --  2.3*  --   --   --   --    < > = values in this interval not displayed.   Recent Labs    06/10/16 1300 06/10/16 1838 06/12/16 0546 04/10/17 1200  AST 41 46*  --  60*  ALT 41 51  --   68*  ALKPHOS 77 75  --  87  BILITOT 0.5 0.4  --  0.5  PROT 8.4* 8.1  --  8.4*  ALBUMIN 4.4 4.0 3.4* 4.4   Recent Labs    06/10/16 1838  06/15/16 0718 08/25/16 0700 04/10/17 1200  WBC 9.3   < > 9.4 5.2 12.4*  NEUTROABS 7.3  --   --  3.0 10.3*  HGB 14.2   < > 13.1 12.6 14.0  HCT 44.1   < > 41.1 38.8 46.1*  MCV 93.0   < > 91.9 89.4 94.9  PLT 256   < > 231 222 244   < > = values in this interval not displayed.   Lab Results  Component Value Date   TSH 0.823 04/10/2017   Lab Results  Component Value Date   HGBA1C  05/17/2007    5.4 (NOTE)   The ADA recommends the following therapeutic goals for glycemic   control related to Hgb A1C measurement:   Goal of Therapy:   < 7.0% Hgb A1C   Action Suggested:  > 8.0% Hgb A1C   Ref:  Diabetes Care, 22, Suppl. 1, 1999   Lab Results  Component Value Date   CHOL  05/22/2007    175        ATP III CLASSIFICATION:  <200     mg/dL   Desirable  200-239  mg/dL   Borderline High  >=240    mg/dL   High   HDL 37 (L) 05/22/2007   LDLCALC  05/22/2007    96        Total Cholesterol/HDL:CHD Risk Coronary Heart Disease Risk Table                     Men   Women  1/2 Average Risk   3.4   3.3   TRIG 208 (H) 05/22/2007   CHOLHDL 4.7 05/22/2007    Significant Diagnostic Results in last 30 days:  No results found.  Assessment/Plan #1  continued malaise with elevated white count and low-grade fever- patient appears slightly improved from yesterday but still does not appear to be feeling well-I did discuss this with her daughter and will order a urinalysis and culture.  Continue to monitor her vital signs every shift- also will update lab work including a CBC with differential to keep an eye on her elevated white count as well as a metabolic panel keep an eye on her renal function and electrolytes.  She would be a poor candidate for IV secondary to dementia.  2.  Hypertension blood pressure is somewhat elevated I suspect this may be more of a  reaction to her not feeling well if this is persistent consider increasing Norvasc however--at this point will monitor.  [id number]  Addendum we have obtained the results of urinalysis which is suspicious for UTI with large leukocytes- WBCs too numerous to count and many bacteria-will start empirically on Cipro 250 twice daily for 7 days and monitor also will add a probiotic twice daily for 10 days.--Will await final culture results and sensitivities  Per chart review it appears back in February she did have an enterococcus UTI which was sensitive to quinolones

## 2017-04-12 ENCOUNTER — Non-Acute Institutional Stay (SKILLED_NURSING_FACILITY): Payer: Medicare Other | Admitting: Internal Medicine

## 2017-04-12 ENCOUNTER — Encounter (HOSPITAL_COMMUNITY)
Admission: RE | Admit: 2017-04-12 | Discharge: 2017-04-12 | Disposition: A | Discharge status: Home / Self Care | Payer: Medicare Other | Source: Skilled Nursing Facility | Attending: Internal Medicine | Admitting: Internal Medicine

## 2017-04-12 ENCOUNTER — Encounter: Payer: Self-pay | Admitting: Internal Medicine

## 2017-04-12 DIAGNOSIS — Z9181 History of falling: Secondary | ICD-10-CM | POA: Diagnosis not present

## 2017-04-12 DIAGNOSIS — K219 Gastro-esophageal reflux disease without esophagitis: Secondary | ICD-10-CM | POA: Diagnosis not present

## 2017-04-12 DIAGNOSIS — Z853 Personal history of malignant neoplasm of breast: Secondary | ICD-10-CM | POA: Insufficient documentation

## 2017-04-12 DIAGNOSIS — D72829 Elevated white blood cell count, unspecified: Secondary | ICD-10-CM

## 2017-04-12 DIAGNOSIS — R1311 Dysphagia, oral phase: Secondary | ICD-10-CM | POA: Diagnosis not present

## 2017-04-12 DIAGNOSIS — F319 Bipolar disorder, unspecified: Secondary | ICD-10-CM | POA: Insufficient documentation

## 2017-04-12 DIAGNOSIS — I10 Essential (primary) hypertension: Secondary | ICD-10-CM | POA: Diagnosis not present

## 2017-04-12 DIAGNOSIS — F0391 Unspecified dementia with behavioral disturbance: Secondary | ICD-10-CM | POA: Diagnosis not present

## 2017-04-12 DIAGNOSIS — R5381 Other malaise: Secondary | ICD-10-CM

## 2017-04-12 LAB — CBC WITH DIFFERENTIAL/PLATELET
Basophils Absolute: 0 10*3/uL (ref 0.0–0.1)
Basophils Relative: 0 %
EOS ABS: 0.1 10*3/uL (ref 0.0–0.7)
EOS PCT: 1 %
HCT: 46.2 % — ABNORMAL HIGH (ref 36.0–46.0)
Hemoglobin: 14 g/dL (ref 12.0–15.0)
LYMPHS ABS: 1.3 10*3/uL (ref 0.7–4.0)
Lymphocytes Relative: 16 %
MCH: 28.9 pg (ref 26.0–34.0)
MCHC: 30.3 g/dL (ref 30.0–36.0)
MCV: 95.3 fL (ref 78.0–100.0)
MONO ABS: 0.6 10*3/uL (ref 0.1–1.0)
MONOS PCT: 8 %
Neutro Abs: 6 10*3/uL (ref 1.7–7.7)
Neutrophils Relative %: 75 %
PLATELETS: 215 10*3/uL (ref 150–400)
RBC: 4.85 MIL/uL (ref 3.87–5.11)
RDW: 14 % (ref 11.5–15.5)
WBC: 8 10*3/uL (ref 4.0–10.5)

## 2017-04-12 LAB — COMPREHENSIVE METABOLIC PANEL
ALBUMIN: 4 g/dL (ref 3.5–5.0)
ALT: 50 U/L (ref 14–54)
ANION GAP: 8 (ref 5–15)
AST: 30 U/L (ref 15–41)
Alkaline Phosphatase: 72 U/L (ref 38–126)
BUN: 40 mg/dL — ABNORMAL HIGH (ref 6–20)
CHLORIDE: 113 mmol/L — AB (ref 101–111)
CO2: 27 mmol/L (ref 22–32)
Calcium: 9.9 mg/dL (ref 8.9–10.3)
Creatinine, Ser: 2.1 mg/dL — ABNORMAL HIGH (ref 0.44–1.00)
GFR calc non Af Amer: 21 mL/min — ABNORMAL LOW (ref >60)
GFR, EST AFRICAN AMERICAN: 25 mL/min — AB (ref >60)
GLUCOSE: 120 mg/dL — AB (ref 65–99)
Potassium: 3.8 mmol/L (ref 3.5–5.1)
SODIUM: 148 mmol/L — AB (ref 135–145)
Total Bilirubin: 0.7 mg/dL (ref 0.3–1.2)
Total Protein: 7.7 g/dL (ref 6.5–8.1)

## 2017-04-12 NOTE — Progress Notes (Signed)
Location:   [facility] Room Number: 110/W Place of Service:  SNF [phone]) Provider:  Freddi Starr, MD  Patient Care Team: [name], MD as PCP - General (Internal Medicine) [name], [name], MD as Consulting Physician (Gastroenterology)  Extended Emergency Contact Information Primary Emergency Contact: [name] Address: [address]          [place], [place] [address] [name] of [place] Phone: [phone] Work Phone: [phone] Mobile Phone: [phone] Relation: Daughter Secondary Emergency Contact: [name] Address: [place]          [place], [place] [address] [name] of [place] Phone: [phone] Mobile Phone: [phone] Relation: Relative  Code Status:  DNR Goals of care: Advanced Directive information Advanced Directives 04/12/2017  Does Patient Have a Medical Advance Directive? Yes  Type of Advance Directive Out of facility DNR (pink MOST or yellow form)  Does patient want to make changes to medical advance directive? No - Patient declined  Copy of [place] in Chart? No - copy requested  Would patient like information on creating a medical advance directive? No - Patient declined  Pre-existing out of facility DNR order (yellow form or pink MOST form) -    Chief complaint-acute visit to follow-up malaise-leukocytosis- hypertension   HPI:  Pt is a 81 y.o. female seen today for an acute visit for follow-up of malaise with elevated white count.  Patient has a history of advanced dementia as well as chronic renal disease breast cancer as well as GERD gastroparesis and hypertension.  She is mainly on comfort care with no aggressive measures.  She has had a difficult week with mildly elevated temperature as well as malaise- white count was mildly elevated at 12,400.  We did order a chest x-ray which did not show any acute process-yesterday we obtain a urinalysis and culture as  well-urinalysis was suspicious for UTI with many bacteria WBCs too numerous to count and large leukocytes- she has been started on Cipro.  Updated lab work today actually shows white count is normalized at 8000. Her sodium has risen slightly up to 1.48 renal function appears slightly improved with a creatinine of 2.1 BUN of 40 which is relatively close to her baseline    We have also been monitoring her blood pressure which is been somewhat elevated above her baseline this week- however this morning appears to be significantly elevated with a systolic in the 793J diastolic slightly above 030 I got 108 manually.  She is on Norvasc 5 mg a day prior to this had fairly decent control.  She has severe dementia and cannot really give any review of systems but clinically appears to be doing better today she is more alert making eye contact appears less flushed-appears to be more at her baseline.  Currently she is afebrile again blood pressure is elevated   Past Medical History:  Diagnosis Date  . Cancer ([facility])   . Dementia   . Gallstones   . Gastroparalysis   . HTN (hypertension)   . Infiltrating ductal carcinoma of right female breast ([facility]) 12/02/2009   Qualifier: History of  By: [name] CMA [name]), June    . Osteoporosis 11/18/2011  . Reflux   . Renal disorder    renal insufficiency.    Past Surgical History:  Procedure Laterality Date  . BREAST BIOPSY    . BREAST LUMPECTOMY    . COLONOSCOPY  2009   Dr. Fuller Plan: normal  .  ESOPHAGOGASTRODUODENOSCOPY  12/11/2009   Dr. Fuller Plan: hiatal hernia  . ESOPHAGOGASTRODUODENOSCOPY N/A 01/29/2014   Procedure: ESOPHAGOGASTRODUODENOSCOPY (EGD);  Surgeon: [name], MD;  [facility];  Service: Endoscopy;  Laterality: N/A;  245  . TONSILLECTOMY AND ADENOIDECTOMY      Allergies  Allergen Reactions  . Aricept [Donepezil Hydrochloride] Nausea And Vomiting  . Exelon [Rivastigmine Tartrate]   . Lithium Nausea And Vomiting     Outpatient Encounter Medications as of 04/12/2017  Medication Sig  . acetaminophen (TYLENOL) 325 MG tablet Take one tablet by mouth at bedtime, take 650 mg by mouth every 4 hours prn  . amLODipine (NORVASC) 5 MG tablet Take 5 mg by mouth daily.  . ciprofloxacin (CIPRO) 250 MG tablet Take 250 mg by mouth 2 (two) times daily.  [name] docusate (COLACE) 50 MG/5ML liquid Take 100 mg by mouth daily.  [name] LACTOBACILLUS PO Take 1 tablet by mouth twice a day until 04/20/2017  . Olopatadine HCl 0.2 % SOLN Place 1 drop into both eyes daily as needed.  [name] omeprazole (PRILOSEC) 20 MG capsule Take 20 mg by mouth daily.  . ondansetron (ZOFRAN-ODT) 4 MG disintegrating tablet Dissolve 1 tablet in mouth 4 times daily before meals and at bedtime for nausea  . polyethylene glycol (MIRALAX / GLYCOLAX) packet Take 17 g by mouth daily.    . potassium chloride 20 MEQ/15ML (10%) SOLN Take 40 mEq by mouth daily.   [name] senna-docusate (SENOKOT-S) 8.6-50 MG tablet Take 2 tablets by mouth at bedtime.    No facility-administered encounter medications on file as of 04/12/2017.     Review of Systems   Essentially unobtainable secondary to dementia please see HPI  Immunization History  Administered Date(s) Administered  . Influenza-Unspecified 02/13/2014, 02/09/2016, 02/10/2017  . Pneumococcal Conjugate-13 02/10/2017  . Pneumococcal Polysaccharide-23 02/29/2016  . Tdap 01/30/2017   Pertinent  Health Maintenance Due  Topic Date Due  . INFLUENZA VACCINE  Completed  . DEXA SCAN  Completed  . PNA vac Low Risk Adult  Completed   Fall Risk  01/26/2017  Falls in the past year? No   Functional Status Survey:    Temperature is 98.6 pulse 88 respirations 20 manual blood pressure 170/108 O2 saturation is 95% on room air  Physical Exam   In general this is a somewhat frail-appearing elderly female who appears to be more alert feeling better today lying comfortably in bed.  Her skin is warm and dry.  Eyes visual acuity  appears grossly intact sclera and conjunctive are clear.  Oropharynx is clear mucous membranes moist.  Chest clear to auscultation with poor respiratory effort there is no labored breathing.  Heart is regular rate and rhythm without murmur gallop or rub has scant lower extremity edema.  Abdomen soft does not appear to be tender there are positive bowel sounds.  GU again cannot appreciate significant suprapubic tenderness or distention.  Musculoskeletal Limited exam since she is in bed but appears able to move all extremities baseline holds her arms in a contracted position but these can be extended.  Neurologic is grossly intact her speech is clear is making eye contact pleasant smiling today which is more her baseline.  Psych as noted above findings consistent with advanced dementia but she appears to be more alert interactive today.    Labs reviewed: Recent Labs    06/02/16 0700  06/12/16 0546  02/24/17 0700 04/10/17 1200 04/12/17 0710  NA 143   < > 143   < >  142 146* 148*  K 3.6   < > 3.6   < > 3.7 4.2 3.8  CL 105   < > 108   < > 104 110 113*  CO2 28   < > 25   < > 29 26 27   GLUCOSE 101*   < > 104*   < > 84 170* 120*  BUN 37*   < > 36*   < > 48* 58* 40*  CREATININE 1.53*   < > 1.69*   < > 1.83* 2.36* 2.10*  CALCIUM 9.6   < > 9.1   < > 9.4 9.9 9.9  MG 2.6*  --   --   --   --   --   --   PHOS  --   --  2.3*  --   --   --   --    < > = values in this interval not displayed.   Recent Labs    06/10/16 1838 06/12/16 0546 04/10/17 1200 04/12/17 0710  AST 46*  --  60* 30  ALT 51  --  68* 50  ALKPHOS 75  --  87 72  BILITOT 0.4  --  0.5 0.7  PROT 8.1  --  8.4* 7.7  ALBUMIN 4.0 3.4* 4.4 4.0   Recent Labs    08/25/16 0700 04/10/17 1200 04/12/17 0710  WBC 5.2 12.4* 8.0  NEUTROABS 3.0 10.3* 6.0  HGB 12.6 14.0 14.0  HCT 38.8 46.1* 46.2*  MCV 89.4 94.9 95.3  PLT 222 244 215   Lab Results  Component Value Date   TSH 0.823 04/10/2017   Lab Results  Component  Value Date   HGBA1C  05/17/2007    5.4 (NOTE)   The ADA recommends the following therapeutic goals for glycemic   control related to Hgb A1C measurement:   Goal of Therapy:   < 7.0% Hgb A1C   Action Suggested:  > 8.0% Hgb A1C   Ref:  Diabetes Care, 22, Suppl. 1, 1999   Lab Results  Component Value Date   CHOL  05/22/2007    175        ATP III CLASSIFICATION:  <200     mg/dL   Desirable  200-239  mg/dL   Borderline High  >=240    mg/dL   High   HDL 37 (L) 05/22/2007   LDLCALC  05/22/2007    96        Total Cholesterol/HDL:CHD Risk Coronary Heart Disease Risk Table                     Men   Women  1/2 Average Risk   3.4   3.3   TRIG 208 (H) 05/22/2007   CHOLHDL 4.7 05/22/2007    Significant Diagnostic Results in last 30 days:  No results found.  Assessment/Plan  #1-malaise this actually appears to be improved she appears closer to her baseline.  We have obtained a urinalysis which is suspicious for infection and we have started her on Cipro empirically-white count is now 8.0.  Her sodium is slightly up fluids will have to be encouraged apparently she is taking p.o. a bit better.  Blood pressure also is elevated will give her clonidine 0.1 mg now monitor her blood pressure- will increase her Norvasc up to 10 mg a day as well.  Also will update her BMP and CBC on Friday, December 17.  [id number]

## 2017-04-14 ENCOUNTER — Encounter (HOSPITAL_COMMUNITY)
Admission: RE | Admit: 2017-04-14 | Discharge: 2017-04-14 | Disposition: A | Discharge status: Home / Self Care | Payer: Medicare Other | Source: Skilled Nursing Facility | Attending: Internal Medicine | Admitting: Internal Medicine

## 2017-04-14 DIAGNOSIS — F319 Bipolar disorder, unspecified: Secondary | ICD-10-CM | POA: Insufficient documentation

## 2017-04-14 DIAGNOSIS — K219 Gastro-esophageal reflux disease without esophagitis: Secondary | ICD-10-CM | POA: Insufficient documentation

## 2017-04-14 DIAGNOSIS — F0391 Unspecified dementia with behavioral disturbance: Secondary | ICD-10-CM | POA: Insufficient documentation

## 2017-04-14 DIAGNOSIS — Z9181 History of falling: Secondary | ICD-10-CM | POA: Insufficient documentation

## 2017-04-14 DIAGNOSIS — Z853 Personal history of malignant neoplasm of breast: Secondary | ICD-10-CM | POA: Insufficient documentation

## 2017-04-14 DIAGNOSIS — R1311 Dysphagia, oral phase: Secondary | ICD-10-CM | POA: Insufficient documentation

## 2017-04-14 LAB — URINE CULTURE: Culture: >=100000 — AB

## 2017-04-18 ENCOUNTER — Non-Acute Institutional Stay (SKILLED_NURSING_FACILITY): Payer: Medicare Other | Admitting: Internal Medicine

## 2017-04-18 ENCOUNTER — Encounter: Payer: Self-pay | Admitting: Internal Medicine

## 2017-04-18 DIAGNOSIS — E870 Hyperosmolality and hypernatremia: Secondary | ICD-10-CM

## 2017-04-18 DIAGNOSIS — R1110 Vomiting, unspecified: Secondary | ICD-10-CM

## 2017-04-18 DIAGNOSIS — N390 Urinary tract infection, site not specified: Secondary | ICD-10-CM | POA: Diagnosis not present

## 2017-04-18 NOTE — Progress Notes (Signed)
Location:   [facility] Room Number: 110/W Place of Service:  SNF [phone]) Provider:  Freddi Starr, MD  Patient Care Team: [name], MD as PCP - General (Internal Medicine) [name], [name], MD as Consulting Physician (Gastroenterology)  Extended Emergency Contact Information Primary Emergency Contact: [name] Address: [address]          [place], [place] [address] [name] of [place] Phone: [phone] Work Phone: [phone] Mobile Phone: [phone] Relation: Daughter Secondary Emergency Contact: [name] Address: [address]          [place], [place] [address] [name] of [place] Phone: [phone] Mobile Phone: [phone] Relation: Relative  Code Status:  DNR Goals of care: Advanced Directive information Advanced Directives 04/18/2017  Does Patient Have a Medical Advance Directive? Yes  Type of Advance Directive Out of facility DNR (pink MOST or yellow form)  Does patient want to make changes to medical advance directive? No - Patient declined  Copy of [place] in Chart? No - copy requested  Would patient like information on creating a medical advance directive? No - Patient declined  Pre-existing out of facility DNR order (yellow form or pink MOST form) -    Chief complaint-acute visit follow-up vomiting   HPI:  Pt is a 81 y.o. female seen today for an acute visit for follow-up of vomiting.  Patient is a long-term resident of the facility she has numerous diagnoses including advanced dementia and chronic renal disease breast cancer as well as GERD gastroparesis and hypertension.  She is essentially on comfort care with limited aggressive measures.  She does have a history of vomiting in the past with gastroparesis and is on routine Zofran 4 times a day which appears to be effective at one point she was on Reglan but this was stopped secondary to concerns of tardive  dyskinesia.  However patient developed vomiting over the weekend and her daughter has requested the Reglan be started again-and starting yesterday Reglan was started twice daily.  Apparently the vomiting has improved she was seen last week for malaise-chest x-ray did not show any acute process urine was suspicious for E. coli UTI she is been put on Cipro empirically but this has been switched to cefdinir per sensitivities  She did have initially have an elevated white count with down sodium is slightly elevated at 148 on lab done back on the fifth.  Family and patient are encouraging fluid intake.  Currently she appears to be stable vomiting appears to have improved vital signs appear to be stable she is afebrile   Past Medical History:  Diagnosis Date  . Cancer ([facility])   . Dementia   . Gallstones   . Gastroparalysis   . HTN (hypertension)   . Infiltrating ductal carcinoma of right female breast ([facility]) 12/02/2009   Qualifier: History of  By: [name] CMA [name]), June    . Osteoporosis 11/18/2011  . Reflux   . Renal disorder    renal insufficiency.    Past Surgical History:  Procedure Laterality Date  . BREAST BIOPSY    . BREAST LUMPECTOMY    . COLONOSCOPY  2009   Dr. Fuller Plan: normal  . ESOPHAGOGASTRODUODENOSCOPY  12/11/2009   Dr. Fuller Plan: hiatal hernia  . ESOPHAGOGASTRODUODENOSCOPY N/A 01/29/2014   Procedure: ESOPHAGOGASTRODUODENOSCOPY (EGD);  Surgeon: [name], MD;  [facility];  Service: Endoscopy;  Laterality: N/A;  245  . TONSILLECTOMY AND ADENOIDECTOMY  Allergies  Allergen Reactions  . Aricept [Donepezil Hydrochloride] Nausea And Vomiting  . Exelon [Rivastigmine Tartrate]   . Lithium Nausea And Vomiting    Outpatient Encounter Medications as of 04/18/2017  Medication Sig  . acetaminophen (TYLENOL) 325 MG tablet Take one tablet by mouth at bedtime, take 650 mg by mouth every 4 hours prn  . amLODipine (NORVASC) 10 MG tablet Take 10 mg by mouth daily.     . cefdinir (OMNICEF) 300 MG capsule Take 300 mg by mouth 2 (two) times daily.  [name] docusate (COLACE) 50 MG/5ML liquid Take 100 mg by mouth daily.  [name] LACTOBACILLUS PO Take 1 tablet by mouth twice a day until 04/20/2017  . metoCLOPramide (REGLAN) 5 MG tablet Take 5 mg by mouth 4 (four) times daily.  . Olopatadine HCl 0.2 % SOLN Place 1 drop into both eyes daily as needed.  [name] omeprazole (PRILOSEC) 20 MG capsule Take 20 mg by mouth daily.  . ondansetron (ZOFRAN-ODT) 4 MG disintegrating tablet Dissolve 1 tablet in mouth 4 times daily before meals and at bedtime for nausea  . polyethylene glycol (MIRALAX / GLYCOLAX) packet Take 17 g by mouth daily.    . potassium chloride 20 MEQ/15ML (10%) SOLN Take 40 mEq by mouth daily.   [name] senna-docusate (SENOKOT-S) 8.6-50 MG tablet Take 2 tablets by mouth at bedtime.   . [DISCONTINUED] amLODipine (NORVASC) 5 MG tablet Take 10 mg by mouth daily.   . [DISCONTINUED] ciprofloxacin (CIPRO) 250 MG tablet Take 250 mg by mouth 2 (two) times daily.   No facility-administered encounter medications on file as of 04/18/2017.     Review of Systems   Essentially unobtainable secondary to dementia please see HPI  Immunization History  Administered Date(s) Administered  . Influenza-Unspecified 02/13/2014, 02/09/2016, 02/10/2017  . Pneumococcal Conjugate-13 02/10/2017  . Pneumococcal Polysaccharide-23 02/29/2016  . Tdap 01/30/2017   Pertinent  Health Maintenance Due  Topic Date Due  . INFLUENZA VACCINE  Completed  . DEXA SCAN  Completed  . PNA vac Low Risk Adult  Completed   Fall Risk  01/26/2017  Falls in the past year? No   Functional Status Survey:    Vitals:   04/18/17 1512  BP: 130/87  Pulse: 69  Resp: 20  Temp: 98.5 F (36.9 C)  TempSrc: Oral  SpO2: 97%    Physical Exam   In general this is a frail-appearing elderly female who does not appear to be in any distress she appears to be at her baseline she is alert but confused which is her  baseline.  Skin is warm and dry she is not diaphoretic.  Eyes visual acuity appears grossly intact.  Her oropharynx is clear mucous membranes appear fairly moist.  Chest is clear to auscultation with poor respiratory effort there is no labored breathing.  Heart is regular rate and rhythm without murmur gallop or rub she continues with fairly minimal lower extremity edema.  Her abdomen is soft nontender she does have active bowel sounds.  Musculoskeletal does move her extremities at baseline ambulates in a wheelchair.  Neurologic is grossly intact her speech is clear- she is making eye contact and smiling at times which is her baseline.  Psych as noted above findings consistent with advanced dementia follow some simple verbal commands with prompting    Labs reviewed: Recent Labs    06/02/16 0700  06/12/16 0546  02/24/17 0700 04/10/17 1200 04/12/17 0710  NA 143   < > 143   < > 142 146*  148*  K 3.6   < > 3.6   < > 3.7 4.2 3.8  CL 105   < > 108   < > 104 110 113*  CO2 28   < > 25   < > 29 26 27   GLUCOSE 101*   < > 104*   < > 84 170* 120*  BUN 37*   < > 36*   < > 48* 58* 40*  CREATININE 1.53*   < > 1.69*   < > 1.83* 2.36* 2.10*  CALCIUM 9.6   < > 9.1   < > 9.4 9.9 9.9  MG 2.6*  --   --   --   --   --   --   PHOS  --   --  2.3*  --   --   --   --    < > = values in this interval not displayed.   Recent Labs    06/10/16 1838 06/12/16 0546 04/10/17 1200 04/12/17 0710  AST 46*  --  60* 30  ALT 51  --  68* 50  ALKPHOS 75  --  87 72  BILITOT 0.4  --  0.5 0.7  PROT 8.1  --  8.4* 7.7  ALBUMIN 4.0 3.4* 4.4 4.0   Recent Labs    08/25/16 0700 04/10/17 1200 04/12/17 0710  WBC 5.2 12.4* 8.0  NEUTROABS 3.0 10.3* 6.0  HGB 12.6 14.0 14.0  HCT 38.8 46.1* 46.2*  MCV 89.4 94.9 95.3  PLT 222 244 215   Lab Results  Component Value Date   TSH 0.823 04/10/2017   Lab Results  Component Value Date   HGBA1C  05/17/2007    5.4 (NOTE)   The ADA recommends the following  therapeutic goals for glycemic   control related to Hgb A1C measurement:   Goal of Therapy:   < 7.0% Hgb A1C   Action Suggested:  > 8.0% Hgb A1C   Ref:  Diabetes Care, 22, Suppl. 1, 1999   Lab Results  Component Value Date   CHOL  05/22/2007    175        ATP III CLASSIFICATION:  <200     mg/dL   Desirable  200-239  mg/dL   Borderline High  >=240    mg/dL   High   HDL 37 (L) 05/22/2007   LDLCALC  05/22/2007    96        Total Cholesterol/HDL:CHD Risk Coronary Heart Disease Risk Table                     Men   Women  1/2 Average Risk   3.4   3.3   TRIG 208 (H) 05/22/2007   CHOLHDL 4.7 05/22/2007    Significant Diagnostic Results in last 30 days:  No results found.  Assessment/Plan  #1 vomiting-this appears to have largely resolved-she is on Zofran 4 times a day and also now on Reglan twice daily at this point will monitor her daughter would really like her to stay on this she also is not on PPI will monitor for any increase in tardive dyskinesia symptoms- again emphasis is largely on comfort.  2.  UTI positive for E. coli she is on Cefdinir   -clinically she appears to be doing better she had malaise last week but now appears to be more at her baseline she is up alert looks to be more comfortable.  3.-  Mild hypernatremia-again staff and her daughter  who is actually a dietitian in the facility are encouraging fluid and p.o. intake as much as possible emphasis is on comfort with minimal lab draws at this point will monitor clinically.  [id number]

## 2017-04-19 ENCOUNTER — Non-Acute Institutional Stay (SKILLED_NURSING_FACILITY): Payer: Medicare Other | Admitting: Internal Medicine

## 2017-04-19 ENCOUNTER — Encounter: Payer: Self-pay | Admitting: Internal Medicine

## 2017-04-19 DIAGNOSIS — R112 Nausea with vomiting, unspecified: Secondary | ICD-10-CM | POA: Diagnosis not present

## 2017-04-19 DIAGNOSIS — F0281 Dementia in other diseases classified elsewhere with behavioral disturbance: Secondary | ICD-10-CM

## 2017-04-19 DIAGNOSIS — N289 Disorder of kidney and ureter, unspecified: Secondary | ICD-10-CM

## 2017-04-19 DIAGNOSIS — I10 Essential (primary) hypertension: Secondary | ICD-10-CM

## 2017-04-19 DIAGNOSIS — K3184 Gastroparesis: Secondary | ICD-10-CM

## 2017-04-19 DIAGNOSIS — R1314 Dysphagia, pharyngoesophageal phase: Secondary | ICD-10-CM | POA: Diagnosis not present

## 2017-04-19 DIAGNOSIS — R21 Rash and other nonspecific skin eruption: Secondary | ICD-10-CM | POA: Diagnosis not present

## 2017-04-19 DIAGNOSIS — G309 Alzheimer's disease, unspecified: Secondary | ICD-10-CM

## 2017-04-19 NOTE — Progress Notes (Signed)
Location:   [facility] Room Number: 110/W Place of Service:  SNF [phone]) Provider:  Kyung Rudd, Rene Kocher, MD  Patient Care Team: [name], MD as PCP - General (Internal Medicine) [name], [name], MD as Consulting Physician (Gastroenterology)  Extended Emergency Contact Information Primary Emergency Contact: [name] Address: [address]          [place], [place] [address] [name] of [place] Phone: [phone] Work Phone: [phone] Mobile Phone: [phone] Relation: Daughter Secondary Emergency Contact: [name] Address: [address]          [place], [place] [address] [name] of [place] Phone: [phone] Mobile Phone: [phone] Relation: Relative  Code Status:  DNR Goals of care: Advanced Directive information Advanced Directives 04/19/2017  Does Patient Have a Medical Advance Directive? Yes  Type of Advance Directive Out of facility DNR (pink MOST or yellow form)  Does patient want to make changes to medical advance directive? No - Patient declined  Copy of [place] in Chart? No - copy requested  Would patient like information on creating a medical advance directive? No - Patient declined  Pre-existing out of facility DNR order (yellow form or pink MOST form) -     Chief Complaint  Patient presents with  . Medical Management of Chronic Issues    Routine Visit  . Acute Visit    Patient c/o Vaginal Yeast    HPI:  Pt is a 81 y.o. female seen today for medical management of chronic diseases.   Patient has h/o hypertension, Advanced Dementia, Chronic renal disease, Breast cancer s/p Mastectomy, GERD and Gastroparesis. Patient is long term Resident of facility.  Patient was recently treated for UTI and is presently on Cefdinir.  She was also restarted on her Reglan twice a day.  As her daughter had noticed that her vomiting had returned especially in the morning.  She is also  continues to be on Prilosec and Zofran 4 times a day.She had blood work done and her sodium was 148.  But Patient's weight continue to be stable at 143 pounds. Her daughter does not want any aggressive measures and patient continues to be comfort care.  The nurses this morning also noticed patient had vaginal rash in and around her thigh area. Patient has no significant dementia and is unable to give any history.    Past Medical History:  Diagnosis Date  . Cancer ([facility])   . Dementia   . Gallstones   . Gastroparalysis   . HTN (hypertension)   . Infiltrating ductal carcinoma of right female breast ([facility]) 12/02/2009   Qualifier: History of  By: [name] CMA [name]), June    . Osteoporosis 11/18/2011  . Reflux   . Renal disorder    renal insufficiency.    Past Surgical History:  Procedure Laterality Date  . BREAST BIOPSY    . BREAST LUMPECTOMY    . COLONOSCOPY  2009   Dr. Fuller Plan: normal  . ESOPHAGOGASTRODUODENOSCOPY  12/11/2009   Dr. Fuller Plan: hiatal hernia  . ESOPHAGOGASTRODUODENOSCOPY N/A 01/29/2014   Procedure: ESOPHAGOGASTRODUODENOSCOPY (EGD);  Surgeon: [name], MD;  [facility];  Service: Endoscopy;  Laterality: N/A;  245  . TONSILLECTOMY AND ADENOIDECTOMY      Allergies  Allergen Reactions  . Aricept [Donepezil Hydrochloride] Nausea And Vomiting  . Exelon [Rivastigmine Tartrate]   . Lithium Nausea And Vomiting    Outpatient Encounter Medications as of 04/19/2017  Medication Sig  .  acetaminophen (TYLENOL) 325 MG tablet Take one tablet by mouth at bedtime, take 650 mg by mouth every 4 hours prn  . amLODipine (NORVASC) 10 MG tablet Take 10 mg by mouth daily.   . cefdinir (OMNICEF) 300 MG capsule Take 300 mg by mouth 2 (two) times daily.  [name] docusate (COLACE) 50 MG/5ML liquid Take 100 mg by mouth daily.  [name] LACTOBACILLUS PO Take 1 tablet by mouth twice a day until 04/20/2017  . metoCLOPramide (REGLAN) 5 MG tablet Take 5 mg by mouth 2 (two) times daily.   .  Olopatadine HCl 0.2 % SOLN Place 1 drop into both eyes daily as needed.  [name] omeprazole (PRILOSEC) 20 MG capsule Take 20 mg by mouth daily.  . ondansetron (ZOFRAN-ODT) 4 MG disintegrating tablet Dissolve 1 tablet in mouth 4 times daily before meals and at bedtime for nausea  . polyethylene glycol (MIRALAX / GLYCOLAX) packet Take 17 g by mouth daily.    . potassium chloride 20 MEQ/15ML (10%) SOLN Take 40 mEq by mouth daily.   [name] senna-docusate (SENOKOT-S) 8.6-50 MG tablet Take 2 tablets by mouth at bedtime.    No facility-administered encounter medications on file as of 04/19/2017.      Review of Systems  Unable to perform ROS: Dementia      Immunization History  Administered Date(s) Administered  . Influenza-Unspecified 02/13/2014, 02/09/2016, 02/10/2017  . Pneumococcal Conjugate-13 02/10/2017  . Pneumococcal Polysaccharide-23 02/29/2016  . Tdap 01/30/2017   Pertinent  Health Maintenance Due  Topic Date Due  . INFLUENZA VACCINE  Completed  . DEXA SCAN  Completed  . PNA vac Low Risk Adult  Completed   Fall Risk  01/26/2017  Falls in the past year? No   Functional Status Survey:    Vitals:   04/19/17 1152  BP: 104/68  Pulse: 73  Resp: 20  Temp: 98.3 F (36.8 C)  TempSrc: Oral  SpO2: 96%   There is no height or weight on file to calculate BMI. Physical Exam  Constitutional: She appears well-developed.  HENT:  Head: Normocephalic.  Mouth/Throat: Oropharynx is clear and moist.  Eyes: Pupils are equal, round, and reactive to light.  Neck: Neck supple.  Cardiovascular: Normal rate, regular rhythm and normal heart sounds.  Pulmonary/Chest: Effort normal and breath sounds normal. No respiratory distress. She has no wheezes. She has no rales.  Abdominal: Soft. Bowel sounds are normal. She exhibits no distension. There is no tenderness. There is no rebound.  Musculoskeletal: She exhibits no edema.  Lymphadenopathy:    She has no cervical adenopathy.  Neurological: She is  alert.  Doesn't Follow commands  Skin:  Has Rash in and around her Vaginal area and inner thigh area.    Labs reviewed: Recent Labs    06/02/16 0700  06/12/16 0546  02/24/17 0700 04/10/17 1200 04/12/17 0710  NA 143   < > 143   < > 142 146* 148*  K 3.6   < > 3.6   < > 3.7 4.2 3.8  CL 105   < > 108   < > 104 110 113*  CO2 28   < > 25   < > 29 26 27   GLUCOSE 101*   < > 104*   < > 84 170* 120*  BUN 37*   < > 36*   < > 48* 58* 40*  CREATININE 1.53*   < > 1.69*   < > 1.83* 2.36* 2.10*  CALCIUM 9.6   < > 9.1   < >  9.4 9.9 9.9  MG 2.6*  --   --   --   --   --   --   PHOS  --   --  2.3*  --   --   --   --    < > = values in this interval not displayed.   Recent Labs    06/10/16 1838 06/12/16 0546 04/10/17 1200 04/12/17 0710  AST 46*  --  60* 30  ALT 51  --  68* 50  ALKPHOS 75  --  87 72  BILITOT 0.4  --  0.5 0.7  PROT 8.1  --  8.4* 7.7  ALBUMIN 4.0 3.4* 4.4 4.0   Recent Labs    08/25/16 0700 04/10/17 1200 04/12/17 0710  WBC 5.2 12.4* 8.0  NEUTROABS 3.0 10.3* 6.0  HGB 12.6 14.0 14.0  HCT 38.8 46.1* 46.2*  MCV 89.4 94.9 95.3  PLT 222 244 215   Lab Results  Component Value Date   TSH 0.823 04/10/2017   Lab Results  Component Value Date   HGBA1C  05/17/2007    5.4 (NOTE)   The ADA recommends the following therapeutic goals for glycemic   control related to Hgb A1C measurement:   Goal of Therapy:   < 7.0% Hgb A1C   Action Suggested:  > 8.0% Hgb A1C   Ref:  Diabetes Care, 22, Suppl. 1, 1999   Lab Results  Component Value Date   CHOL  05/22/2007    175        ATP III CLASSIFICATION:  <200     mg/dL   Desirable  200-239  mg/dL   Borderline High  >=240    mg/dL   High   HDL 37 (L) 05/22/2007   LDLCALC  05/22/2007    96        Total Cholesterol/HDL:CHD Risk Coronary Heart Disease Risk Table                     Men   Women  1/2 Average Risk   3.4   3.3   TRIG 208 (H) 05/22/2007   CHOLHDL 4.7 05/22/2007    Significant Diagnostic Results in last 30 days:  No  results found.  Assessment/Plan Essential hypertension  Patient's amlodipine was increased to 10 mg.  Blood pressure is running low I will decrease her dose to 7.5 mg  Dysphagia, and gastroparesis Patient is on Reglan, Zofran and Prilosec She only takes liquid and does not eat.  But with support of nurses and her daughter her weight has been stable.  Hypernatremia  patient did have a sodium of 148.  But at that time she was having UTI nausea and vomiting Her daughter did not want any IV fluids.  We will repeat a BMP in 2 weeks now that the patient is doing well  Alzheimer's dementia  Continue nursing support  Renal insufficiency  patient's renal function has decreased past few months.   No aggressive workup at this time  Vaginal rash We will start her on Diflucan 100 mg for 5 days Lotrisone cream on the affected area.   Family/ staff Communication:   Labs/tests ordered:

## 2017-04-23 ENCOUNTER — Encounter: Payer: Self-pay | Admitting: Internal Medicine

## 2017-04-28 ENCOUNTER — Encounter: Payer: Self-pay | Admitting: Internal Medicine

## 2017-04-28 ENCOUNTER — Non-Acute Institutional Stay (SKILLED_NURSING_FACILITY): Payer: Medicare Other | Admitting: Internal Medicine

## 2017-04-28 DIAGNOSIS — R609 Edema, unspecified: Secondary | ICD-10-CM

## 2017-04-28 NOTE — Progress Notes (Signed)
This is an acute visit.  Level care skilled.  Facility is [facility].  Chief complaint acute visit secondary to erythematous lower legs and feet.  History of present illness.   Patient is a 81 year old female seen today for erythematous feet.  She has a history of dementia chronic renal disease breast cancer with mastectomy as well as GERD gastroparesis.  She is a long-term care resident-.  At times will have occasionally some increased lower extremity edema that her daughter does not wish for aggressive treatment however with emphasis mainly on comfort care.  Apparently this afternoon her feet appear to be somewhat erythematous and I am following up on this.  According to nursing and her daughter she had the same look to her feet yesterday while in a dependent position but this morning apparently edema and erythema were essentially resolved after lying in bed overnight.  Patient is a poor historian and cannot really give any review of systems review of systems essentially unobtainable secondary to dementia            Past Medical History:  Diagnosis Date  . Cancer ([facility])   . Dementia   . Gallstones   . Gastroparalysis   . HTN (hypertension)   . Infiltrating ductal carcinoma of right female breast ([facility]) 12/02/2009   Qualifier: History of  By: [name] CMA [name]), June    . Osteoporosis 11/18/2011  . Reflux   . Renal disorder    renal insufficiency.         Past Surgical History:  Procedure Laterality Date  . BREAST BIOPSY    . BREAST LUMPECTOMY    . COLONOSCOPY  2009   Dr. Fuller Plan: normal  . ESOPHAGOGASTRODUODENOSCOPY  12/11/2009   Dr. Fuller Plan: hiatal hernia  . ESOPHAGOGASTRODUODENOSCOPY N/A 01/29/2014   Procedure: ESOPHAGOGASTRODUODENOSCOPY (EGD);  Surgeon: [name], MD;  [facility];  Service: Endoscopy;  Laterality: N/A;  245  . TONSILLECTOMY AND ADENOIDECTOMY          Allergies  Allergen Reactions  .  Aricept [Donepezil Hydrochloride] Nausea And Vomiting  . Exelon [Rivastigmine Tartrate]   . Lithium Nausea And Vomiting     Medication Sig  . acetaminophen (TYLENOL) 325 MG tablet Take one tablet by mouth at bedtime, take 650 mg by mouth every 4 hours prn  . amLODipine (NORVASC) 7.5 MG tablet Take 7.5mg  by mouth daily.   . cefdinir (OMNICEF) 300 MG capsule Take 300 mg by mouth 2 (two) times daily.  [name] docusate (COLACE) 50 MG/5ML liquid Take 100 mg by mouth daily.  [name] LACTOBACILLUS PO Take 1 tablet by mouth twice a day until 04/20/2017  . metoCLOPramide (REGLAN) 5 MG tablet Take 5 mg by mouth 2 (two) times daily.   . Olopatadine HCl 0.2 % SOLN Place 1 drop into both eyes daily as needed.  [name] omeprazole (PRILOSEC) 20 MG capsule Take 20 mg by mouth daily.  . ondansetron (ZOFRAN-ODT) 4 MG disintegrating tablet Dissolve 1 tablet in mouth 4 times daily before meals and at bedtime for nausea  . polyethylene glycol (MIRALAX / GLYCOLAX) packet Take 17 g by mouth daily.    . potassium chloride 20 MEQ/15ML (10%) SOLN Take 40 mEq by mouth daily.   [name] senna-docusate (SENOKOT-S) 8.6-50 MG tablet Take 2 tablets by mouth at bedtime.    No facility-administered encounter medications on file as of 04/19/2017.      Review of systems unobtainable secondary to dementia.  Physical exam.  In general this  is a somewhat frail elderly female no distress sitting comfortably in a wheelchair.  Her skin is warm and dry  Musculoskeletal. She is able to move her extremities at baseline does not really follow verbal commands  Extremities-she does have some cool erythematous changes to her feet bilaterally with confluent erythema this is relatively cool to touch and nontender.  Assessment plan.  Pedal erythema with edema-I suspect this is more dependent related venous stasis changes---with history of resolving with elevation while in bed overnight would be suspicious that this is more dependent rubor.  At  this point will monitor- I do not really think this is cellulitis at this point will monitor for changes.  [id number]

## 2017-04-29 ENCOUNTER — Encounter: Payer: Self-pay | Admitting: Internal Medicine

## 2017-04-29 ENCOUNTER — Non-Acute Institutional Stay (SKILLED_NURSING_FACILITY): Payer: Medicare Other | Admitting: Internal Medicine

## 2017-04-29 DIAGNOSIS — R609 Edema, unspecified: Secondary | ICD-10-CM

## 2017-04-29 DIAGNOSIS — I10 Essential (primary) hypertension: Secondary | ICD-10-CM

## 2017-04-29 NOTE — Progress Notes (Signed)
This is an acute visit.  Level care skilled.  Facility is [facility].  Chief complaint acute visit follow-up l pedal edema and erythema.  History of present illness.  Patient is a 81 year old who is a long-term resident of facility with numerous diagnoses including advanced dementia-chronic kidney disease as well as gastroparesis and hypertension.  I saw yesterday for some pedal edema that was erythematous- per discussion with nursing yesterday apparently this developed during the day and when her legs are elevated the erythema and edema appear to improve significantly.  I was asked to recheck this today secondary to family concerns-- and appears to be a similar presentation according to her nurse this morning the edema and the erythema were essentially resolved when patient was in bed this morning but erythema and edema to the feet return when she was in a dependent position for a period of time.  I did discuss this with her daughter via phone and did state that I thought this was most likely dependent edema- family does not wish real aggressive interventions here patient is essentially on comfort care with a history of advanced dementia.  I do know earlier this year she did have some more increased edema we did do a BNP which was mildly elevated at around 600-again wishes were for fairly conservative follow-up and interventions we just monitored it.  . Past Medical History:  Diagnosis Date  . Cancer ([place])   . Dementia   . Gallstones   . Gastroparalysis   . HTN (hypertension)   . Infiltrating ductal carcinoma of right female breast ([place]) 12/02/2009   Qualifier: History of By: [name] CMA [name]), June   . Osteoporosis 11/18/2011  . Reflux   . Renal disorder    renal insufficiency.        Past Surgical History:  Procedure Laterality Date  . BREAST BIOPSY    . BREAST LUMPECTOMY    . COLONOSCOPY  2009   Dr. Fuller Plan: normal  . ESOPHAGOGASTRODUODENOSCOPY   12/11/2009   Dr. Fuller Plan: hiatal hernia  . ESOPHAGOGASTRODUODENOSCOPY N/A 01/29/2014   Procedure: ESOPHAGOGASTRODUODENOSCOPY (EGD); Surgeon: [name], MD; Location: [facility]; Service: Endoscopy; Laterality: N/A; 245  . TONSILLECTOMY AND ADENOIDECTOMY          Allergies  Allergen Reactions  . Aricept [Donepezil Hydrochloride] Nausea And Vomiting  . Exelon [Rivastigmine Tartrate]   . Lithium Nausea And Vomiting         Medication Sig  . acetaminophen (TYLENOL) 325 MG tablet Take one tablet by mouth at bedtime, take 650 mg by mouth every 4 hours prn  . amLODipine (NORVASC) 7.5 MG tablet Take 7.5mg  by mouth daily.   . cefdinir (OMNICEF) 300 MG capsule Take 300 mg by mouth 2 (two) times daily.  [name] docusate (COLACE) 50 MG/5ML liquid Take 100 mg by mouth daily.  [name] LACTOBACILLUS PO Take 1 tablet by mouth twice a day until 04/20/2017  . metoCLOPramide (REGLAN) 5 MG tablet Take 5 mg by mouth 2 (two) times daily.   . Olopatadine HCl 0.2 % SOLN Place 1 drop into both eyes daily as needed.  [name] omeprazole (PRILOSEC) 20 MG capsule Take 20 mg by mouth daily.  . ondansetron (ZOFRAN-ODT) 4 MG disintegrating tablet Dissolve 1 tablet in mouth 4 times daily before meals and at bedtime for nausea  . polyethylene glycol (MIRALAX / GLYCOLAX) packet Take 17 g by mouth daily.   . potassium chloride 20 MEQ/15ML (10%) SOLN Take 40 mEq by mouth daily.   [name]  senna-docusate (SENOKOT-S) 8.6-50 MG tablet Take 2 tablets by mouth at bedtime.    Review of systems unobtainable secondary to dementia.  Per nursing staff there is been no notes of any discomfort or shortness of breath.  Physical exam.  Temperature is 98.7 pulse 60 respirations 20 blood pressure 133/67 O2 saturation is in the 90s on room air.  In general this is a somewhat frail elderly female in no distress sitting comfortably in a wheelchair.  Her skin is warm and dry.  Chest is clear to auscultation with poor respiratory  effort.  Heart is regular rate and rhythm without murmur gallop or rub.  Abdomen is soft does not appear to be tender has positive bowel sounds.  Musculoskeletal moves her extremities at baseline--limited secondary patient not really following verbal commands but I do not note any changes and any of her extremities  She continues to have some bilateral pedal edema more on the right versus the left she does have pale erythematous changes to her feet this does not extend up the leg however at all-the erythema is fairly cool to touch pedal pulses are palpable.    Neurologic appears to be grossly intact she is alert again limited exam since she does not follow verbal commands  Psych findings compatible with advanced dementia does not really follow verbal commands but she is not really agitated with exam either.  Labs.  April 12, 2017.  Sodium 148 potassium 3.8 BUN 40 creatinine 2.10.  Albumin 4.0 liver function tests within normal limits.  WBC 8.0 hemoglobin 14.0 platelets 215.  Assessment plan.  Bilateral pedal edema with erythema- this appears to be more dependent related stasis issue rather than cellulitis-I did discuss this with her daughter via phone-and at this point will monitor her family does not desire aggressive intervention.-Diuretics etc. that would require frequent lab monitoring.  I do note her albumin appears to be stable was at 4.0 on lab done earlier this month  Leg elevation will have to be encouraged.  Also with the increased edema on the right versus the left will order venous Doppler to rule out any DVT my suspicion here is somewhat low however.  #2 hypertension- at one point there was concern that she had a significantly elevated blood pressure at times her Norvasc was increased to 10 mg a day but this appeared to lead to some hypotensive readings she is now on 7.5 mg a day of Norvasc and this appears to be stable blood pressure today was 133/67 I see previous  listed 105/61--at this point will monitor  [id number]-of note greater than 25 minutes spent assessing patient-discussing her status with nursing staff as well as with her daughter via phone  And coordinating plan of care

## 2017-05-01 DIAGNOSIS — R600 Localized edema: Secondary | ICD-10-CM | POA: Diagnosis not present

## 2017-05-04 ENCOUNTER — Encounter (HOSPITAL_COMMUNITY)
Admission: RE | Admit: 2017-05-04 | Discharge: 2017-05-04 | Disposition: A | Discharge status: Home / Self Care | Payer: Medicare Other | Source: Skilled Nursing Facility | Attending: *Deleted | Admitting: *Deleted

## 2017-05-04 DIAGNOSIS — Z9181 History of falling: Secondary | ICD-10-CM | POA: Diagnosis not present

## 2017-05-04 DIAGNOSIS — R1311 Dysphagia, oral phase: Secondary | ICD-10-CM | POA: Diagnosis not present

## 2017-05-04 DIAGNOSIS — F319 Bipolar disorder, unspecified: Secondary | ICD-10-CM | POA: Diagnosis not present

## 2017-05-04 DIAGNOSIS — F0391 Unspecified dementia with behavioral disturbance: Secondary | ICD-10-CM | POA: Diagnosis not present

## 2017-05-04 DIAGNOSIS — Z853 Personal history of malignant neoplasm of breast: Secondary | ICD-10-CM | POA: Diagnosis not present

## 2017-05-04 DIAGNOSIS — K219 Gastro-esophageal reflux disease without esophagitis: Secondary | ICD-10-CM | POA: Diagnosis not present

## 2017-05-04 LAB — BASIC METABOLIC PANEL
ANION GAP: 12 (ref 5–15)
BUN: 61 mg/dL — ABNORMAL HIGH (ref 6–20)
CALCIUM: 9.8 mg/dL (ref 8.9–10.3)
CO2: 25 mmol/L (ref 22–32)
Chloride: 106 mmol/L (ref 101–111)
Creatinine, Ser: 2.26 mg/dL — ABNORMAL HIGH (ref 0.44–1.00)
GFR, EST AFRICAN AMERICAN: 23 mL/min — AB (ref >60)
GFR, EST NON AFRICAN AMERICAN: 19 mL/min — AB (ref >60)
Glucose, Bld: 92 mg/dL (ref 65–99)
POTASSIUM: 4.2 mmol/L (ref 3.5–5.1)
Sodium: 143 mmol/L (ref 135–145)

## 2017-05-10 ENCOUNTER — Encounter (HOSPITAL_COMMUNITY)
Admission: RE | Admit: 2017-05-10 | Discharge: 2017-05-10 | Disposition: A | Discharge status: Home / Self Care | Payer: Medicare Other | Source: Skilled Nursing Facility | Attending: Internal Medicine | Admitting: Internal Medicine

## 2017-05-10 DIAGNOSIS — K3184 Gastroparesis: Secondary | ICD-10-CM | POA: Insufficient documentation

## 2017-05-10 LAB — MAGNESIUM: Magnesium: 2.7 mg/dL — ABNORMAL HIGH (ref 1.7–2.4)

## 2017-05-17 ENCOUNTER — Encounter: Payer: Self-pay | Admitting: Internal Medicine

## 2017-05-17 ENCOUNTER — Non-Acute Institutional Stay (SKILLED_NURSING_FACILITY): Payer: Medicare Other | Admitting: Internal Medicine

## 2017-05-17 DIAGNOSIS — F0281 Dementia in other diseases classified elsewhere with behavioral disturbance: Secondary | ICD-10-CM | POA: Diagnosis not present

## 2017-05-17 DIAGNOSIS — I10 Essential (primary) hypertension: Secondary | ICD-10-CM | POA: Diagnosis not present

## 2017-05-17 DIAGNOSIS — N289 Disorder of kidney and ureter, unspecified: Secondary | ICD-10-CM | POA: Diagnosis not present

## 2017-05-17 DIAGNOSIS — C50911 Malignant neoplasm of unspecified site of right female breast: Secondary | ICD-10-CM

## 2017-05-17 DIAGNOSIS — G309 Alzheimer's disease, unspecified: Secondary | ICD-10-CM | POA: Diagnosis not present

## 2017-05-17 DIAGNOSIS — K3184 Gastroparesis: Secondary | ICD-10-CM

## 2017-05-17 NOTE — Progress Notes (Signed)
Location:   [facility] Room Number: 110/W Place of Service:  SNF (31) Provider:  Freddi Starr, MD  Patient Care Team: [name], MD as PCP - General (Internal Medicine) [name], [name], MD as Consulting Physician (Gastroenterology)  Extended Emergency Contact Information Primary Emergency Contact: [facility] Address: [address]          [place], [place] [address] [name] of [place] Phone: [phone] Work Phone: [phone] Mobile Phone: [phone] Relation: Daughter Secondary Emergency Contact: [name] Address: [address]          [place], [place] [address] [name] of [place] Phone: [phone] Mobile Phone: [phone] Relation: Relative  Code Status:  DNR Goals of care: Advanced Directive information Advanced Directives 05/17/2017  Does Patient Have a Medical Advance Directive? Yes  Type of Advance Directive Out of facility DNR (pink MOST or yellow form)  Does patient want to make changes to medical advance directive? No - Patient declined  Copy of [place] in Chart? No - copy requested  Would patient like information on creating a medical advance directive? No - Patient declined  Pre-existing out of facility DNR order (yellow form or pink MOST form) -     Chief Complaint  Patient presents with  . Medical Management of Chronic Issues    Routine Visit  For medical management of chronic medical issues including advanced dementia-chronic kidney disease-history of vomiting gastroparesis- hypertension-  HPI:  Pt is a 82 y.o. female seen today for medical management of chronic diseases.  As noted above.  At this point she appears to be relatively stable-she does have a history of recurrent vomiting but she is now on Zofran routinely 4 times a day as well as Reglan twice daily this appears to have helped significantly.  Her weight appears to be stable at around 146  pounds.  Nursing does not report any acute issues at this time.  She does have a past history of significant dementia but appears to be doing well with supportive care her daughter actually is a dietitian in the facility and is extremely supportive.  She does have a history of chronic kidney disease which is been relatively stable with a creatinine of 2.26 BUN of 61 on lab done in late December.  She also has a history of hypokalemia which has been stable with potassium 4.2 on the December lab this is being supplemented at 40 mEq a day.  Regards to hypertension this appears stable on Norvasc 7.5 mg a day she was up to 10 mg but had some hypotension at the higher dose.  Blood pressure today is 110/70.  She also at times of had some lower extremity edema this is thought to be dependent related and appears to resolve when her legs are elevated or when she is in bed.  She also has a history of breast cancer but is off medications per family request for conservative care secondary to her advanced dementia.  Unobtainable secondary to dementia please see HPI   Past Medical History:  Diagnosis Date  . Cancer ([place])   . Dementia   . Gallstones   . Gastroparalysis   . HTN (hypertension)   . Infiltrating ductal carcinoma of right female breast ([place]) 12/02/2009   Qualifier: History of  By: [name] CMA [name]), June    . Osteoporosis 11/18/2011  . Reflux   . Renal disorder    renal insufficiency.    Past Surgical  History:  Procedure Laterality Date  . BREAST BIOPSY    . BREAST LUMPECTOMY    . COLONOSCOPY  2009   Dr. Fuller Plan: normal  . ESOPHAGOGASTRODUODENOSCOPY  12/11/2009   Dr. Fuller Plan: hiatal hernia  . ESOPHAGOGASTRODUODENOSCOPY N/A 01/29/2014   Procedure: ESOPHAGOGASTRODUODENOSCOPY (EGD);  Surgeon: [name], MD;  [facility];  Service: Endoscopy;  Laterality: N/A;  245  . TONSILLECTOMY AND ADENOIDECTOMY      Allergies  Allergen Reactions  . Aricept [Donepezil  Hydrochloride] Nausea And Vomiting  . Exelon [Rivastigmine Tartrate]   . Lithium Nausea And Vomiting    Outpatient Encounter Medications as of 05/17/2017  Medication Sig  . acetaminophen (TYLENOL) 325 MG tablet Take one tablet by mouth at bedtime, take 650 mg by mouth every 4 hours prn  . amLODipine (NORVASC) 2.5 MG tablet Take 2.5 mg by mouth along with 5 mg to = 7.5 mg once a day  . amLODipine (NORVASC) 5 MG tablet Take 5 mg by mouth along with 2.5 mg to = 7.5 mg once a day  . clotrimazole-betamethasone (LOTRISONE) cream Apply 1 application topically 2 (two) times daily. Apply to rash until resolved  . docusate (COLACE) 50 MG/5ML liquid Take 100 mg by mouth daily.  . metoCLOPramide (REGLAN) 5 MG tablet Take 5 mg by mouth 2 (two) times daily.   . Olopatadine HCl 0.2 % SOLN Place 1 drop into both eyes daily as needed.  [name] omeprazole (PRILOSEC) 20 MG capsule Take 20 mg by mouth daily.  . ondansetron (ZOFRAN-ODT) 4 MG disintegrating tablet Dissolve 1 tablet in mouth 4 times daily before meals and at bedtime for nausea  . polyethylene glycol (MIRALAX / GLYCOLAX) packet Take 17 g by mouth daily.    . potassium chloride 20 MEQ/15ML (10%) SOLN Take 40 mEq by mouth daily.   [name] senna-docusate (SENOKOT-S) 8.6-50 MG tablet Take 2 tablets by mouth at bedtime.   . [DISCONTINUED] amLODipine (NORVASC) 10 MG tablet Take 10 mg by mouth daily.   . [DISCONTINUED] cefdinir (OMNICEF) 300 MG capsule Take 300 mg by mouth 2 (two) times daily.  . [DISCONTINUED] LACTOBACILLUS PO Take 1 tablet by mouth twice a day until 04/20/2017   No facility-administered encounter medications on file as of 05/17/2017.      Review of Systems   Unobtainable secondary to dementia please see HPI  Immunization History  Administered Date(s) Administered  . Influenza-Unspecified 02/13/2014, 02/09/2016, 02/10/2017  . Pneumococcal Conjugate-13 02/10/2017  . Pneumococcal Polysaccharide-23 02/29/2016  . Tdap 01/30/2017   Pertinent   Health Maintenance Due  Topic Date Due  . INFLUENZA VACCINE  Completed  . DEXA SCAN  Completed  . PNA vac Low Risk Adult  Completed   Fall Risk  01/26/2017  Falls in the past year? No   Functional Status Survey:    Vitals:   05/17/17 1108  BP: (!) 148/57  Pulse: (!) 56  Resp: 20  Temp: 97.9 F (36.6 C)  TempSrc: Oral  SpO2: 98%  Weight: 146 lb 12.8 oz (66.6 kg)  Height: 5\' 7"  (1.702 m)   Body mass index is 22.99 kg/m. Physical Exam   In general this is a pleasant elderly female in no distress sitting comfortably in a wheelchair.  Her skin is warm and dry she is not diaphoretic.  Eyes pupils appear reactive to light sclera and conjunctive are clear.  Visual acuity appears grossly intact.  Oropharynx was difficult to assess since patient did not really open her mouth.  Chest  is clear to auscultation with poor respiratory effort there is no labored breathing.  Heart is regular rate and rhythm slightly bradycardic in the 50s without murmur gallop or rub she does not really have much edema today even though she is sitting up.  Abdomen is soft nontender with positive bowel sounds.  Musculoskeletal is able to move all extremities x4 at baseline.  Neurologic is grossly intact no lateralizing findings her speech is nonsensical.  Psych she is oriented to self only has difficulty however following verbal commands she will make eye contact and she is alert.    Labs reviewed: Recent Labs    06/02/16 0700  06/12/16 0546  04/10/17 1200 04/12/17 0710 05/04/17 0900 05/10/17 0800  NA 143   < > 143   < > 146* 148* 143  --   K 3.6   < > 3.6   < > 4.2 3.8 4.2  --   CL 105   < > 108   < > 110 113* 106  --   CO2 28   < > 25   < > 26 27 25   --   GLUCOSE 101*   < > 104*   < > 170* 120* 92  --   BUN 37*   < > 36*   < > 58* 40* 61*  --   CREATININE 1.53*   < > 1.69*   < > 2.36* 2.10* 2.26*  --   CALCIUM 9.6   < > 9.1   < > 9.9 9.9 9.8  --   MG 2.6*  --   --   --   --   --    --  2.7*  PHOS  --   --  2.3*  --   --   --   --   --    < > = values in this interval not displayed.   Recent Labs    06/10/16 1838 06/12/16 0546 04/10/17 1200 04/12/17 0710  AST 46*  --  60* 30  ALT 51  --  68* 50  ALKPHOS 75  --  87 72  BILITOT 0.4  --  0.5 0.7  PROT 8.1  --  8.4* 7.7  ALBUMIN 4.0 3.4* 4.4 4.0   Recent Labs    08/25/16 0700 04/10/17 1200 04/12/17 0710  WBC 5.2 12.4* 8.0  NEUTROABS 3.0 10.3* 6.0  HGB 12.6 14.0 14.0  HCT 38.8 46.1* 46.2*  MCV 89.4 94.9 95.3  PLT 222 244 215   Lab Results  Component Value Date   TSH 0.823 04/10/2017   Lab Results  Component Value Date   HGBA1C  05/17/2007    5.4 (NOTE)   The ADA recommends the following therapeutic goals for glycemic   control related to Hgb A1C measurement:   Goal of Therapy:   < 7.0% Hgb A1C   Action Suggested:  > 8.0% Hgb A1C   Ref:  Diabetes Care, 22, Suppl. 1, 1999   Lab Results  Component Value Date   CHOL  05/22/2007    175        ATP III CLASSIFICATION:  <200     mg/dL   Desirable  200-239  mg/dL   Borderline High  >=240    mg/dL   High   HDL 37 (L) 05/22/2007   LDLCALC  05/22/2007    96        Total Cholesterol/HDL:CHD Risk Coronary Heart Disease Risk Table  Men   Women  1/2 Average Risk   3.4   3.3   TRIG 208 (H) 05/22/2007   CHOLHDL 4.7 05/22/2007    Significant Diagnostic Results in last 30 days:  No results found.  Assessment/Plan  #1 dementia this appears to be quite advanced but she appears to be doing well with supportive care her weight is stable at this point will monitor.  2.  History of vomiting with gastroparesis-this is stabilized on current medications including Reglan Zofran as well as a proton pump inhibitor her weight again has stabilized.  3.-History of chronic kidney disease this appears relatively baseline as noted above we have tried to minimize lab draws secondary to emphasis on comfort.  4.  History of hypokalemia this is  supplemented again potassium has been stable now for some time 4.2 on lab done in late December.  5.  Hypertension as noted above this appears stable on Norvasc current dose of 7.5 mg-appears 10 mg led to some hypotension.  6.  History of lower extremity edema this appears stable suspect there is an element of dependency here edema is fairly minimal today.  7.  History of breast cancer again not on treatment per family wishes for conservative care.  CPT- [phone]

## 2017-05-18 DIAGNOSIS — I739 Peripheral vascular disease, unspecified: Secondary | ICD-10-CM | POA: Diagnosis not present

## 2017-05-18 DIAGNOSIS — B351 Tinea unguium: Secondary | ICD-10-CM | POA: Diagnosis not present

## 2017-05-24 ENCOUNTER — Non-Acute Institutional Stay (SKILLED_NURSING_FACILITY): Payer: Medicare Other | Admitting: Internal Medicine

## 2017-05-24 ENCOUNTER — Encounter: Payer: Self-pay | Admitting: Internal Medicine

## 2017-05-24 DIAGNOSIS — G309 Alzheimer's disease, unspecified: Secondary | ICD-10-CM | POA: Diagnosis not present

## 2017-05-24 DIAGNOSIS — W19XXXA Unspecified fall, initial encounter: Secondary | ICD-10-CM | POA: Diagnosis not present

## 2017-05-24 DIAGNOSIS — K3184 Gastroparesis: Secondary | ICD-10-CM

## 2017-05-24 DIAGNOSIS — F0281 Dementia in other diseases classified elsewhere with behavioral disturbance: Secondary | ICD-10-CM | POA: Diagnosis not present

## 2017-05-24 NOTE — Progress Notes (Signed)
Location:   [place] Room Number: 110/W Place of Service:  SNF [phone]) Provider:  Freddi Starr, MD  Patient Care Team: [name], MD as PCP - General (Internal Medicine) [name], [name], MD as Consulting Physician (Gastroenterology)  Extended Emergency Contact Information Primary Emergency Contact: [name] Address: [address]          [place], [place] [address] [name] of [place] Phone: [phone] Work Phone: [phone] Mobile Phone: [phone] Relation: Daughter Secondary Emergency Contact: [name] Address: [address]          [place], [place] [address] [name] of [place] Phone: [phone] Mobile Phone: [phone] Relation: Relative  Code Status:  DNR Goals of care: Advanced Directive information Advanced Directives 05/24/2017  Does Patient Have a Medical Advance Directive? Yes  Type of Advance Directive Out of facility DNR (pink MOST or yellow form)  Does patient want to make changes to medical advance directive? No - Patient declined  Copy of [place] in Chart? No - copy requested  Would patient like information on creating a medical advance directive? No - Patient declined  Pre-existing out of facility DNR order (yellow form or pink MOST form) -     Chief complaint-acute visit follow-up fall with small forehead hematoma follow-up of a fall-  HPI:  Pt is a 82 y.o. female seen today for an acute visit for  a fall.  Patient has a history of advanced dementia as well as chronic kidney disease history of gastroparesis and hypertension.  Her dementia is quite advanced.  Apparently she was sitting in her wheelchair and fell asleep and tumbled onto the floor and hit her forehead.  Nursing staff has not noted any injury she appears to be at baseline vital signs are stable she is noted to have a small hematoma mid forehead area.  The patient is largely nonverbal and  cannot really give any review of systems.  Staff did place her back in her wheelchair she was not complaining of pain and assessment by staff did not show any injury other than the hematoma on the forehead.  Currently she is sitting in her wheelchair at baseline does hold her neck in a somewhat contracted position which is baseline-.  Vital signs continue to be stable and at baseline.    Past Medical History:  Diagnosis Date  . Cancer ([place])   . Dementia   . Gallstones   . Gastroparalysis   . HTN (hypertension)   . Infiltrating ductal carcinoma of right female breast ([place]) 12/02/2009   Qualifier: History of  By: [name] CMA [name]), June    . Osteoporosis 11/18/2011  . Reflux   . Renal disorder    renal insufficiency.    Past Surgical History:  Procedure Laterality Date  . BREAST BIOPSY    . BREAST LUMPECTOMY    . COLONOSCOPY  2009   Dr. Fuller Plan: normal  . ESOPHAGOGASTRODUODENOSCOPY  12/11/2009   Dr. Fuller Plan: hiatal hernia  . ESOPHAGOGASTRODUODENOSCOPY N/A 01/29/2014   Procedure: ESOPHAGOGASTRODUODENOSCOPY (EGD);  Surgeon: [name], MD;  [facility];  Service: Endoscopy;  Laterality: N/A;  245  . TONSILLECTOMY AND ADENOIDECTOMY      Allergies  Allergen Reactions  . Aricept [Donepezil Hydrochloride] Nausea And Vomiting  . Exelon [Rivastigmine Tartrate]   . Lithium Nausea And Vomiting    Outpatient Encounter Medications as of 05/24/2017  Medication Sig  . acetaminophen (TYLENOL) 325 MG tablet  Take one tablet by mouth at bedtime, take 650 mg by mouth every 4 hours prn  . amLODipine (NORVASC) 2.5 MG tablet Take 2.5 mg by mouth along with 5 mg to = 7.5 mg once a day  . amLODipine (NORVASC) 5 MG tablet Take 5 mg by mouth along with 2.5 mg to = 7.5 mg once a day  . clotrimazole-betamethasone (LOTRISONE) cream Apply 1 application topically 2 (two) times daily. Apply to rash until resolved  . docusate (COLACE) 50 MG/5ML liquid Take 100 mg by mouth daily.  .  metoCLOPramide (REGLAN) 5 MG tablet Take 5 mg by mouth 2 (two) times daily.   . Olopatadine HCl 0.2 % SOLN Place 1 drop into both eyes daily as needed.  [name] omeprazole (PRILOSEC) 20 MG capsule Take 20 mg by mouth daily.  . ondansetron (ZOFRAN-ODT) 4 MG disintegrating tablet Dissolve 1 tablet in mouth 4 times daily before meals and at bedtime for nausea  . polyethylene glycol (MIRALAX / GLYCOLAX) packet Take 17 g by mouth daily.    . potassium chloride 20 MEQ/15ML (10%) SOLN Take 40 mEq by mouth daily.   [name] senna-docusate (SENOKOT-S) 8.6-50 MG tablet Take 2 tablets by mouth at bedtime.    No facility-administered encounter medications on file as of 05/24/2017.     Review of Systems   Unobtainable secondary to dementia please see HPI  Immunization History  Administered Date(s) Administered  . Influenza-Unspecified 02/13/2014, 02/09/2016, 02/10/2017  . Pneumococcal Conjugate-13 02/10/2017  . Pneumococcal Polysaccharide-23 02/29/2016  . Tdap 01/30/2017   Pertinent  Health Maintenance Due  Topic Date Due  . INFLUENZA VACCINE  Completed  . DEXA SCAN  Completed  . PNA vac Low Risk Adult  Completed   Fall Risk  01/26/2017  Falls in the past year? No   Functional Status Survey:    Vitals:   05/24/17 1214  BP: 138/76  Pulse: 82  Resp: 20  Temp: 98.6 F (37 C)  TempSrc: Oral  SpO2: 96%    Physical Exam   In general this is a somewhat frail elderly female in no distress sitting in her wheelchair at baseline.  Her skin is warm and dry there is a small erythematous appearing hematoma mid forehead this does not appear to be acutely tender to touch has a small amount of elevation I do not see signs of cellulitis.  Eyes pupils appear reactive to light sclera and conjunctive are clear visual acuity appears at baseline again this is somewhat difficult to fully assess secondary to her severe dementia.  Oropharynx is clear mucous membranes moist again limited exam since she did not open  her mouth very wide.  Chest is clear to auscultation with poor respiratory effort there is no labored breathing.  Heart is slightly bradycardic in the 50s without murmur gallop or rub she has scant lower extremity edema.  Abdomen soft protuberant which is baseline with positive bowel sounds.  Musculoskeletal does hold her upper extremities with somewhat of a contracted position but could not really appreciate pain or any deformity with passive range of motion likewise with her lower extremities could not really appreciate discomfort with gentle passive range of motion of her knees hips.  Neurologic she appears to be at baseline findings consistent with advanced dementia she is not really speaking but cranial nerves appear to be intact and appears able to move her extremities at baseline without lateralizing findings although limited exam since she does not follow verbal commands and does hold her upper  extremities in somewhat of a contracted position  Psych again findings consistent with advanced dementia Labs reviewed: Recent Labs    06/02/16 0700  06/12/16 0546  04/10/17 1200 04/12/17 0710 05/04/17 0900 05/10/17 0800  NA 143   < > 143   < > 146* 148* 143  --   K 3.6   < > 3.6   < > 4.2 3.8 4.2  --   CL 105   < > 108   < > 110 113* 106  --   CO2 28   < > 25   < > 26 27 25   --   GLUCOSE 101*   < > 104*   < > 170* 120* 92  --   BUN 37*   < > 36*   < > 58* 40* 61*  --   CREATININE 1.53*   < > 1.69*   < > 2.36* 2.10* 2.26*  --   CALCIUM 9.6   < > 9.1   < > 9.9 9.9 9.8  --   MG 2.6*  --   --   --   --   --   --  2.7*  PHOS  --   --  2.3*  --   --   --   --   --    < > = values in this interval not displayed.   Recent Labs    06/10/16 1838 06/12/16 0546 04/10/17 1200 04/12/17 0710  AST 46*  --  60* 30  ALT 51  --  68* 50  ALKPHOS 75  --  87 72  BILITOT 0.4  --  0.5 0.7  PROT 8.1  --  8.4* 7.7  ALBUMIN 4.0 3.4* 4.4 4.0   Recent Labs    08/25/16 0700 04/10/17 1200  04/12/17 0710  WBC 5.2 12.4* 8.0  NEUTROABS 3.0 10.3* 6.0  HGB 12.6 14.0 14.0  HCT 38.8 46.1* 46.2*  MCV 89.4 94.9 95.3  PLT 222 244 215   Lab Results  Component Value Date   TSH 0.823 04/10/2017   Lab Results  Component Value Date   HGBA1C  05/17/2007    5.4 (NOTE)   The ADA recommends the following therapeutic goals for glycemic   control related to Hgb A1C measurement:   Goal of Therapy:   < 7.0% Hgb A1C   Action Suggested:  > 8.0% Hgb A1C   Ref:  Diabetes Care, 22, Suppl. 1, 1999   Lab Results  Component Value Date   CHOL  05/22/2007    175        ATP III CLASSIFICATION:  <200     mg/dL   Desirable  200-239  mg/dL   Borderline High  >=240    mg/dL   High   HDL 37 (L) 05/22/2007   LDLCALC  05/22/2007    96        Total Cholesterol/HDL:CHD Risk Coronary Heart Disease Risk Table                     Men   Women  1/2 Average Risk   3.4   3.3   TRIG 208 (H) 05/22/2007   CHOLHDL 4.7 05/22/2007    Significant Diagnostic Results in last 30 days:  No results found.  Assessment/Plan  #1 fall-with forehead hematoma-at this point patient appears to be baseline-onlyinjury appears to be in the small hematoma to her forehead- fall protocol will have to be followed with the neurochecks and  vital signs but at this point appears to be stable  I do note that one point she had had an Ativan ordered as needed but this appears to have been discontinued and she has not received this some time--  #2 dementia this appears to be quite but she appears to be doing well with supportive care she has had progression- does not speak nearly as much as she used to also p.o. intake of food is a challenge nonetheless weight appears to have stabilized somewhat- [place] #3 gastroparesis with vomiting this appears controlled with the Reglan and Zofran-would be hesitant to change  medication secondary to acute issues in the past and   relativestability now at this point will monitor w  [id number]--of note  greater than 25 minutes spent assessing patient-reviewing her chart and labs as well as medications- discussing her  status with nursing staff

## 2017-05-25 DIAGNOSIS — M6281 Muscle weakness (generalized): Secondary | ICD-10-CM | POA: Diagnosis not present

## 2017-05-25 DIAGNOSIS — F0391 Unspecified dementia with behavioral disturbance: Secondary | ICD-10-CM | POA: Diagnosis not present

## 2017-05-25 DIAGNOSIS — Z9181 History of falling: Secondary | ICD-10-CM | POA: Diagnosis not present

## 2017-05-25 DIAGNOSIS — R293 Abnormal posture: Secondary | ICD-10-CM | POA: Diagnosis not present

## 2017-06-01 DIAGNOSIS — F0391 Unspecified dementia with behavioral disturbance: Secondary | ICD-10-CM | POA: Diagnosis not present

## 2017-06-01 DIAGNOSIS — R293 Abnormal posture: Secondary | ICD-10-CM | POA: Diagnosis not present

## 2017-06-01 DIAGNOSIS — Z9181 History of falling: Secondary | ICD-10-CM | POA: Diagnosis not present

## 2017-06-01 DIAGNOSIS — M6281 Muscle weakness (generalized): Secondary | ICD-10-CM | POA: Diagnosis not present

## 2017-06-06 DIAGNOSIS — R293 Abnormal posture: Secondary | ICD-10-CM | POA: Diagnosis not present

## 2017-06-06 DIAGNOSIS — F0391 Unspecified dementia with behavioral disturbance: Secondary | ICD-10-CM | POA: Diagnosis not present

## 2017-06-06 DIAGNOSIS — Z9181 History of falling: Secondary | ICD-10-CM | POA: Diagnosis not present

## 2017-06-06 DIAGNOSIS — M6281 Muscle weakness (generalized): Secondary | ICD-10-CM | POA: Diagnosis not present

## 2017-06-07 DIAGNOSIS — F0391 Unspecified dementia with behavioral disturbance: Secondary | ICD-10-CM | POA: Diagnosis not present

## 2017-06-07 DIAGNOSIS — Z9181 History of falling: Secondary | ICD-10-CM | POA: Diagnosis not present

## 2017-06-07 DIAGNOSIS — M6281 Muscle weakness (generalized): Secondary | ICD-10-CM | POA: Diagnosis not present

## 2017-06-07 DIAGNOSIS — R293 Abnormal posture: Secondary | ICD-10-CM | POA: Diagnosis not present

## 2017-06-08 DIAGNOSIS — F0391 Unspecified dementia with behavioral disturbance: Secondary | ICD-10-CM | POA: Diagnosis not present

## 2017-06-08 DIAGNOSIS — R293 Abnormal posture: Secondary | ICD-10-CM | POA: Diagnosis not present

## 2017-06-08 DIAGNOSIS — M6281 Muscle weakness (generalized): Secondary | ICD-10-CM | POA: Diagnosis not present

## 2017-06-08 DIAGNOSIS — Z9181 History of falling: Secondary | ICD-10-CM | POA: Diagnosis not present

## 2017-06-09 ENCOUNTER — Non-Acute Institutional Stay (SKILLED_NURSING_FACILITY): Payer: Medicare Other | Admitting: Internal Medicine

## 2017-06-09 DIAGNOSIS — H109 Unspecified conjunctivitis: Secondary | ICD-10-CM

## 2017-06-09 DIAGNOSIS — R609 Edema, unspecified: Secondary | ICD-10-CM

## 2017-06-09 NOTE — Progress Notes (Signed)
This is an acute visit.  Level of care skilled.  Facility is [facility].  Chief complaint acute visit secondary to irritated eyes.  History of present illness.  Patient is a 82 year old female who is a long-term facility she has a history of advanced dementia as well as chronic kidney disease gastroparesis and hypertension.  She also has at times a history of some colitis.  Her daughter states that she has some irritated appearing eyes bilaterally with clear drainage apparently patient does try to itch them sometimes.  I note that one point she did receive Patanol eyedrops followed by artificial tears which apparently did help for a while.  Past Medical History:  Diagnosis Date  . Cancer ([place])   . Dementia   . Gallstones   . Gastroparalysis   . HTN (hypertension)   . Infiltrating ductal carcinoma of right female breast ([place]) 12/02/2009   Qualifier: History of  By: [name] CMA [name]), June    . Osteoporosis 11/18/2011  . Reflux   . Renal disorder    renal insufficiency.         Past Surgical History:  Procedure Laterality Date  . BREAST BIOPSY    . BREAST LUMPECTOMY    . COLONOSCOPY  2009   Dr. Fuller Plan: normal  . ESOPHAGOGASTRODUODENOSCOPY  12/11/2009   Dr. Fuller Plan: hiatal hernia  . ESOPHAGOGASTRODUODENOSCOPY N/A 01/29/2014   Procedure: ESOPHAGOGASTRODUODENOSCOPY (EGD);  Surgeon: [name], MD;  [facility];  Service: Endoscopy;  Laterality: N/A;  245  . TONSILLECTOMY AND ADENOIDECTOMY          Allergies  Allergen Reactions  . Aricept [Donepezil Hydrochloride] Nausea And Vomiting  . Exelon [Rivastigmine Tartrate]   . Lithium Nausea And Vomiting            Sig  . acetaminophen (TYLENOL) 325 MG tablet Take one tablet by mouth at bedtime, take 650 mg by mouth every 4 hours prn  . amLODipine (NORVASC) 2.5 MG tablet Take 2.5 mg by mouth along with 5 mg to = 7.5 mg once a day  . amLODipine (NORVASC) 5 MG tablet Take 5 mg  by mouth along with 2.5 mg to = 7.5 mg once a day  . clotrimazole-betamethasone (LOTRISONE) cream Apply 1 application topically 2 (two) times daily. Apply to rash until resolved  . docusate (COLACE) 50 MG/5ML liquid Take 100 mg by mouth daily.  . metoCLOPramide (REGLAN) 5 MG tablet Take 5 mg by mouth 2 (two) times daily.   . Olopatadine HCl 0.2 % SOLN Place 1 drop into both eyes daily as needed.  [name] omeprazole (PRILOSEC) 20 MG capsule Take 20 mg by mouth daily.  . ondansetron (ZOFRAN-ODT) 4 MG disintegrating tablet Dissolve 1 tablet in mouth 4 times daily before meals and at bedtime for nausea  . polyethylene glycol (MIRALAX / GLYCOLAX) packet Take 17 g by mouth daily.    . potassium chloride 20 MEQ/15ML (10%) SOLN Take 40 mEq by mouth daily.   [name] senna-docusate (SENOKOT-S) 8.6-50 MG tablet Take 2 tablets by mouth at bedtime.    No facility-administered encounter medications on file as of 05/24/2017.     Review of Systems   Unobtainable secondary to dementia please see HPI--nursing staff does not report any other acute issues      Immunization History  Administered Date(s) Administered  . Influenza-Unspecified 02/13/2014, 02/09/2016, 02/10/2017  . Pneumococcal Conjugate-13 02/10/2017  . Pneumococcal Polysaccharide-23 02/29/2016  . Tdap 01/30/2017       Pertinent  Health Maintenance Due  Topic Date Due  . INFLUENZA VACCINE  Completed  . DEXA SCAN  Completed  . PNA vac Low Risk Adult  Completed   Fall Risk  01/26/2017  Falls in the past year? No   Physical exam.  She is afebrile pulse is 64 respirations of 18.  In general this is a somewhat frail elderly female in no distress sitting comfortably in her wheelchair.  Her skin is warm and dry.  Eyes she does appear to have some clear drainage from her eyes bilaterally I do not really see any exudate her pupils do appear to be reactive to light I do not see any changes in visual acuity there is no orbital erythema or  edema-there is possibly some slight erythema to her conjunctival bilaterally.  Heart is regular rate and rhythm somewhat difficult to assess secondary patient being a bit agitated.  Her chest is clear to auscultation with poor respiratory effort.  Neurologic appears to be grossly intact moves her extremities at baseline.  Musculoskeletal again moves her extremities at baseline she has some mild pedal edema this occurs mainly after she has been up in the wheelchair for a while I suspect this is dependent edema.  Psych findings consistent with advanced dementia she is mildly agitated with exam.   Labs.  May 10, 2017. Magnesium level 2.7 this is been relatively baseline for some time May 04, 2018.  Sodium 143 potassium 4.2 BUN 61 creatinine 2.26.    April 12, 2017.  Liver found test within normal limits albumin was 4.0  WBC 8.0 hemoglobin 14.0 platelets 215.  Assessment plan.  Conjunctivitis in the past she did respond apparently fairly well to the Patanol eyedrops willgive a one-week course of this 1 drop to each eye twice daily for 7 days and then follow this up with artificial tears as needed-monitor for resolution for any changes.  History of lower extremity pedal edema again this appears to be dependent related we have seen this before and usually resolves when she is up in bed for a while.--Family has  deferred aggressive intervention such as diuretics.     [id number]        With

## 2017-06-10 ENCOUNTER — Encounter: Payer: Self-pay | Admitting: Internal Medicine

## 2017-06-12 DIAGNOSIS — Z9181 History of falling: Secondary | ICD-10-CM | POA: Diagnosis not present

## 2017-06-12 DIAGNOSIS — R293 Abnormal posture: Secondary | ICD-10-CM | POA: Diagnosis not present

## 2017-06-12 DIAGNOSIS — M6281 Muscle weakness (generalized): Secondary | ICD-10-CM | POA: Diagnosis not present

## 2017-06-12 DIAGNOSIS — F0391 Unspecified dementia with behavioral disturbance: Secondary | ICD-10-CM | POA: Diagnosis not present

## 2017-06-14 DIAGNOSIS — R293 Abnormal posture: Secondary | ICD-10-CM | POA: Diagnosis not present

## 2017-06-14 DIAGNOSIS — F0391 Unspecified dementia with behavioral disturbance: Secondary | ICD-10-CM | POA: Diagnosis not present

## 2017-06-14 DIAGNOSIS — Z9181 History of falling: Secondary | ICD-10-CM | POA: Diagnosis not present

## 2017-06-14 DIAGNOSIS — M6281 Muscle weakness (generalized): Secondary | ICD-10-CM | POA: Diagnosis not present

## 2017-06-15 ENCOUNTER — Non-Acute Institutional Stay (SKILLED_NURSING_FACILITY): Payer: Medicare Other | Admitting: Internal Medicine

## 2017-06-15 ENCOUNTER — Encounter: Payer: Self-pay | Admitting: Internal Medicine

## 2017-06-15 DIAGNOSIS — G300 Alzheimer's disease with early onset: Secondary | ICD-10-CM

## 2017-06-15 DIAGNOSIS — F02818 Dementia in other diseases classified elsewhere, unspecified severity, with other behavioral disturbance: Secondary | ICD-10-CM

## 2017-06-15 DIAGNOSIS — R1314 Dysphagia, pharyngoesophageal phase: Secondary | ICD-10-CM

## 2017-06-15 DIAGNOSIS — F0281 Dementia in other diseases classified elsewhere with behavioral disturbance: Secondary | ICD-10-CM

## 2017-06-15 DIAGNOSIS — K3184 Gastroparesis: Secondary | ICD-10-CM

## 2017-06-15 DIAGNOSIS — I10 Essential (primary) hypertension: Secondary | ICD-10-CM | POA: Diagnosis not present

## 2017-06-15 DIAGNOSIS — N289 Disorder of kidney and ureter, unspecified: Secondary | ICD-10-CM

## 2017-06-15 NOTE — Progress Notes (Signed)
Location:   [name]: 110/W Place of Service:  SNF (31) Provider:  Clydene Fake, MD  Patient Care Team: [name], MD as PCP - General (Internal Medicine) April Romney [name], MD as Consulting Physician (Gastroenterology)  Extended Emergency Contact Information Primary Emergency Contact: [facility] Address: [address]          [place], [place] [address] [name] of [place] Phone: [phone] Work Phone: [phone] Mobile Phone: [phone] Relation: Daughter Secondary Emergency Contact: [name] Address: [address]          [place], [place] [address] [name] of [place] Phone: [phone] Mobile Phone: [phone] Relation: Relative  Code Status:  DNR Goals of care: Advanced Directive information Advanced Directives 06/15/2017  Does Patient Have a Medical Advance Directive? Yes  Type of Advance Directive Out of facility DNR (pink MOST or yellow form)  Does patient want to make changes to medical advance directive? No - Patient declined  Copy of [place] in Chart? No - copy requested  Would patient like information on creating a medical advance directive? No - Patient declined  Pre-existing out of facility DNR order (yellow form or pink MOST form) -     Chief Complaint  Patient presents with  . Medical Management of Chronic Issues    Routine Visit    HPI:  Pt is a 82 y.o. female seen today for medical management of chronic diseases.   Patient has h/o hypertension, Advanced Dementia, Chronic renal disease, Breast cancer s/p Mastectomy, GERD and Gastroparesis. Patient is long term Resident of facility.  Patient is stable in the facility.  She was restarted on her Reglan few months ago.  She has gained almost 6 pounds since my last visit and is up to 149 lbs. Per nurses she continues to just drink and does not eat  Her daughter does not want any aggressive  measures She states comfort care Patient unable to give any history due to her dementia  Past Medical History:  Diagnosis Date  . Cancer ([place])   . Dementia   . Gallstones   . Gastroparalysis   . HTN (hypertension)   . Infiltrating ductal carcinoma of right female breast ([place]) 12/02/2009   Qualifier: History of  By: [name] CMA [name]), June    . Osteoporosis 11/18/2011  . Reflux   . Renal disorder    renal insufficiency.    Past Surgical History:  Procedure Laterality Date  . BREAST BIOPSY    . BREAST LUMPECTOMY    . COLONOSCOPY  2009   Dr. Fuller Plan: normal  . ESOPHAGOGASTRODUODENOSCOPY  12/11/2009   Dr. Fuller Plan: hiatal hernia  . ESOPHAGOGASTRODUODENOSCOPY N/A 01/29/2014   Procedure: ESOPHAGOGASTRODUODENOSCOPY (EGD);  Surgeon: [name], MD;  [facility];  Service: Endoscopy;  Laterality: N/A;  245  . TONSILLECTOMY AND ADENOIDECTOMY      Allergies  Allergen Reactions  . Aricept [Donepezil Hydrochloride] Nausea And Vomiting  . Exelon [Rivastigmine Tartrate]   . Lithium Nausea And Vomiting    Outpatient Encounter Medications as of 06/15/2017  Medication Sig  . acetaminophen (TYLENOL) 325 MG tablet Take one tablet by mouth at bedtime, take 650 mg by mouth every 4 hours prn  . amLODipine (NORVASC) 2.5 MG tablet Take 2.5 mg by mouth along with 5 mg to = 7.5 mg once a day  . amLODipine (NORVASC) 5 MG tablet Take 5 mg by mouth along with 2.5 mg to =  7.5 mg once a day  . DEXTRAN 70-HYPROMELLOSE OP Apply 1 drop to eye 3 (three) times daily.  [name] docusate (COLACE) 50 MG/5ML liquid Take 100 mg by mouth daily.  . metoCLOPramide (REGLAN) 5 MG tablet Take 5 mg by mouth 2 (two) times daily.   [name] olopatadine (PATANOL) 0.1 % ophthalmic solution Place 1 drop into both eyes 2 (two) times daily.  . Olopatadine HCl 0.2 % SOLN Place 1 drop into both eyes daily as needed.  [name] omeprazole (PRILOSEC) 20 MG capsule Take 20 mg by mouth daily.  . ondansetron (ZOFRAN-ODT) 4 MG disintegrating  tablet Dissolve 1 tablet in mouth 4 times daily before meals and at bedtime for nausea  . polyethylene glycol (MIRALAX / GLYCOLAX) packet Take 17 g by mouth daily.    . potassium chloride SA (K-DUR,KLOR-CON) 20 MEQ tablet Take 20 mEq by mouth daily.  [name] senna-docusate (SENOKOT-S) 8.6-50 MG tablet Take 2 tablets by mouth at bedtime.   . [DISCONTINUED] clotrimazole-betamethasone (LOTRISONE) cream Apply 1 application topically 2 (two) times daily. Apply to rash until resolved  . [DISCONTINUED] potassium chloride 20 MEQ/15ML (10%) SOLN Take 40 mEq by mouth daily.    No facility-administered encounter medications on file as of 06/15/2017.      Review of Systems  Unable to perform ROS: Dementia    Immunization History  Administered Date(s) Administered  . Influenza-Unspecified 02/13/2014, 02/09/2016, 02/10/2017  . Pneumococcal Conjugate-13 02/10/2017  . Pneumococcal Polysaccharide-23 02/29/2016  . Tdap 01/30/2017   Pertinent  Health Maintenance Due  Topic Date Due  . INFLUENZA VACCINE  Completed  . DEXA SCAN  Completed  . PNA vac Low Risk Adult  Completed   Fall Risk  01/26/2017  Falls in the past year? No   Functional Status Survey:    Vitals:   06/15/17 0932  BP: 124/68  Pulse: 64  Resp: 20  Temp: 98.4 F (36.9 C)  TempSrc: Oral  SpO2: 94%  Weight: 149 lb (67.6 kg)  Height: 5\' 7"  (1.702 m)   Body mass index is 23.34 kg/m. Physical Exam  Constitutional: She appears well-developed and well-nourished.  HENT:  Head: Normocephalic.  Mouth/Throat: Oropharynx is clear and moist.  Eyes: Pupils are equal, round, and reactive to light.  Neck: Neck supple.  Cardiovascular: Normal rate and normal heart sounds.  Pulmonary/Chest: Effort normal and breath sounds normal. No respiratory distress. She has no wheezes. She has no rales.  Abdominal: Soft. Bowel sounds are normal. She exhibits no distension. There is no tenderness. There is no rebound.  Musculoskeletal:  Trace edema  bilateral  Neurological: She is alert.  Does not follow any commands or responds to her name  Skin: Skin is warm and dry.  Psychiatric: She has a normal mood and affect. Her behavior is normal.    Labs reviewed: Recent Labs    04/10/17 1200 04/12/17 0710 05/04/17 0900 05/10/17 0800  NA 146* 148* 143  --   K 4.2 3.8 4.2  --   CL 110 113* 106  --   CO2 26 27 25   --   GLUCOSE 170* 120* 92  --   BUN 58* 40* 61*  --   CREATININE 2.36* 2.10* 2.26*  --   CALCIUM 9.9 9.9 9.8  --   MG  --   --   --  2.7*   Recent Labs    04/10/17 1200 04/12/17 0710  AST 60* 30  ALT 68* 50  ALKPHOS 87 72  BILITOT 0.5 0.7  PROT 8.4* 7.7  ALBUMIN 4.4 4.0   Recent Labs    08/25/16 0700 04/10/17 1200 04/12/17 0710  WBC 5.2 12.4* 8.0  NEUTROABS 3.0 10.3* 6.0  HGB 12.6 14.0 14.0  HCT 38.8 46.1* 46.2*  MCV 89.4 94.9 95.3  PLT 222 244 215   Lab Results  Component Value Date   TSH 0.823 04/10/2017   Lab Results  Component Value Date   HGBA1C  05/17/2007    5.4 (NOTE)   The ADA recommends the following therapeutic goals for glycemic   control related to Hgb A1C measurement:   Goal of Therapy:   < 7.0% Hgb A1C   Action Suggested:  > 8.0% Hgb A1C   Ref:  Diabetes Care, 22, Suppl. 1, 1999   Lab Results  Component Value Date   CHOL  05/22/2007    175        ATP III CLASSIFICATION:  <200     mg/dL   Desirable  200-239  mg/dL   Borderline High  >=240    mg/dL   High   HDL 37 (L) 05/22/2007   LDLCALC  05/22/2007    96        Total Cholesterol/HDL:CHD Risk Coronary Heart Disease Risk Table                     Men   Women  1/2 Average Risk   3.4   3.3   TRIG 208 (H) 05/22/2007   CHOLHDL 4.7 05/22/2007    Significant Diagnostic Results in last 30 days:  No results found.  Assessment/Plan Essential hypertension Blood pressure stable on Norvasc 7.5 mg  Dysphagia and gastroparesis Patient is on Reglan Zofran and Prilosec She only takes liquid and does not eat With help of her  daughter and nurses her weight has been stable  Alzheimer's dementia Continues Renal insufficiency Worsening of her renal function Discontinue potassium No aggressive workup Family/ staff Communication:   Labs/tests ordered:    Total time spent in this patient care encounter was 25_ minutes; greater than 50% of the visit spent counseling patient, reviewing records , Labs and coordinating care for problems addressed at this encounter.

## 2017-06-16 DIAGNOSIS — Z9181 History of falling: Secondary | ICD-10-CM | POA: Diagnosis not present

## 2017-06-16 DIAGNOSIS — M6281 Muscle weakness (generalized): Secondary | ICD-10-CM | POA: Diagnosis not present

## 2017-06-16 DIAGNOSIS — F0391 Unspecified dementia with behavioral disturbance: Secondary | ICD-10-CM | POA: Diagnosis not present

## 2017-06-16 DIAGNOSIS — R293 Abnormal posture: Secondary | ICD-10-CM | POA: Diagnosis not present

## 2017-06-19 DIAGNOSIS — M6281 Muscle weakness (generalized): Secondary | ICD-10-CM | POA: Diagnosis not present

## 2017-06-19 DIAGNOSIS — R293 Abnormal posture: Secondary | ICD-10-CM | POA: Diagnosis not present

## 2017-06-19 DIAGNOSIS — F0391 Unspecified dementia with behavioral disturbance: Secondary | ICD-10-CM | POA: Diagnosis not present

## 2017-06-19 DIAGNOSIS — Z9181 History of falling: Secondary | ICD-10-CM | POA: Diagnosis not present

## 2017-06-20 DIAGNOSIS — M6281 Muscle weakness (generalized): Secondary | ICD-10-CM | POA: Diagnosis not present

## 2017-06-20 DIAGNOSIS — F0391 Unspecified dementia with behavioral disturbance: Secondary | ICD-10-CM | POA: Diagnosis not present

## 2017-06-20 DIAGNOSIS — R293 Abnormal posture: Secondary | ICD-10-CM | POA: Diagnosis not present

## 2017-06-20 DIAGNOSIS — Z9181 History of falling: Secondary | ICD-10-CM | POA: Diagnosis not present

## 2017-06-21 DIAGNOSIS — Z9181 History of falling: Secondary | ICD-10-CM | POA: Diagnosis not present

## 2017-06-21 DIAGNOSIS — M6281 Muscle weakness (generalized): Secondary | ICD-10-CM | POA: Diagnosis not present

## 2017-06-21 DIAGNOSIS — F0391 Unspecified dementia with behavioral disturbance: Secondary | ICD-10-CM | POA: Diagnosis not present

## 2017-06-21 DIAGNOSIS — R293 Abnormal posture: Secondary | ICD-10-CM | POA: Diagnosis not present

## 2017-06-26 DIAGNOSIS — Z9181 History of falling: Secondary | ICD-10-CM | POA: Diagnosis not present

## 2017-06-26 DIAGNOSIS — F0391 Unspecified dementia with behavioral disturbance: Secondary | ICD-10-CM | POA: Diagnosis not present

## 2017-06-26 DIAGNOSIS — M6281 Muscle weakness (generalized): Secondary | ICD-10-CM | POA: Diagnosis not present

## 2017-06-26 DIAGNOSIS — R293 Abnormal posture: Secondary | ICD-10-CM | POA: Diagnosis not present

## 2017-06-28 ENCOUNTER — Encounter: Payer: Self-pay | Admitting: Internal Medicine

## 2017-06-28 ENCOUNTER — Non-Acute Institutional Stay (SKILLED_NURSING_FACILITY): Payer: Medicare Other | Admitting: Internal Medicine

## 2017-06-28 DIAGNOSIS — K3184 Gastroparesis: Secondary | ICD-10-CM

## 2017-06-28 DIAGNOSIS — F0281 Dementia in other diseases classified elsewhere with behavioral disturbance: Secondary | ICD-10-CM | POA: Diagnosis not present

## 2017-06-28 DIAGNOSIS — L03114 Cellulitis of left upper limb: Secondary | ICD-10-CM

## 2017-06-28 DIAGNOSIS — G300 Alzheimer's disease with early onset: Secondary | ICD-10-CM | POA: Diagnosis not present

## 2017-06-28 DIAGNOSIS — F02818 Dementia in other diseases classified elsewhere, unspecified severity, with other behavioral disturbance: Secondary | ICD-10-CM

## 2017-06-28 NOTE — Progress Notes (Signed)
Location:   [facility] Room Number: 110/W Place of Service:  SNF [phone]) Provider:  Freddi Starr, MD  Patient Care Team: [name], MD as PCP - General (Internal Medicine) [name], [name], MD as Consulting Physician (Gastroenterology)  Extended Emergency Contact Information Primary Emergency Contact: [name] Address: [address]          [place], [place] [address] [name] of [place] Phone: [phone] Work Phone: [phone] Mobile Phone: [phone] Relation: Daughter Secondary Emergency Contact: [name] Address: [address]          [place],  [address] [name] of [place] Phone: [phone] Mobile Phone: [phone] Relation: Relative  Code Status:  DNR Goals of care: Advanced Directive information Advanced Directives 06/28/2017  Does Patient Have a Medical Advance Directive? Yes  Type of Advance Directive Out of facility DNR (pink MOST or yellow form)  Does patient want to make changes to medical advance directive? No - Patient declined  Copy of [place] in Chart? No - copy requested  Would patient like information on creating a medical advance directive? No - Patient declined  Pre-existing out of facility DNR order (yellow form or pink MOST form) -     Chief complaint-acute visit secondary to left arm erythema  HPI:  Pt is a 82 y.o. female seen today for an acute visit for some increased erythema around her left elbow.  Patient is a long-term resident of facility with a history of severe dementia-as well as chronic renal disease-breast cancer status post mastectomy-as well as GERD and gastroparesis.  Per nursing she is at her baseline-she was restarted on her Reglan with a history of gastroparesis and apparently has gained some weight although  still eats poorly she does drink well per nursing and daughter does not want aggressive measures largely comfort  measures.  She also in the past has had some skin tears-with concerns for cellulitis and has responded to doxycycline-.  Today around her left elbow  appears to have some  increased erythema around a bruise-and nursing has asked me to follow up on this  Vital signs are stable she is afebrile she cannot really give any review of systems secondary to dementia-there has been no apparent trauma to the area Past Medical History:  Diagnosis Date  . Cancer ([facility])   . Dementia   . Gallstones   . Gastroparalysis   . HTN (hypertension)   . Infiltrating ductal carcinoma of right female breast ([place]) 12/02/2009   Qualifier: History of  By: [name] CMA [name]), June    . Osteoporosis 11/18/2011  . Reflux   . Renal disorder    renal insufficiency.    Past Surgical History:  Procedure Laterality Date  . BREAST BIOPSY    . BREAST LUMPECTOMY    . COLONOSCOPY  2009   Dr. Fuller Plan: normal  . ESOPHAGOGASTRODUODENOSCOPY  12/11/2009   Dr. Fuller Plan: hiatal hernia  . ESOPHAGOGASTRODUODENOSCOPY N/A 01/29/2014   Procedure: ESOPHAGOGASTRODUODENOSCOPY (EGD);  Surgeon: [name], MD;  [facility];  Service: Endoscopy;  Laterality: N/A;  245  . TONSILLECTOMY AND ADENOIDECTOMY      Allergies  Allergen Reactions  . Aricept [Donepezil Hydrochloride] Nausea And Vomiting  . Exelon [Rivastigmine Tartrate]   . Lithium Nausea And Vomiting    Outpatient Encounter Medications as of 06/28/2017  Medication Sig  . acetaminophen (TYLENOL) 325 MG tablet Take one tablet by mouth at bedtime, take 650 mg by  mouth every 4 hours prn  . amLODipine (NORVASC) 2.5 MG tablet Take 2.5 mg by mouth along with 5 mg to = 7.5 mg once a day  . amLODipine (NORVASC) 5 MG tablet Take 5 mg by mouth along with 2.5 mg to = 7.5 mg once a day  . docusate (COLACE) 50 MG/5ML liquid Take 100 mg by mouth daily.  . metoCLOPramide (REGLAN) 5 MG tablet Take 5 mg by mouth 2 (two) times daily.   . Olopatadine HCl 0.2 % SOLN Place 1 drop into  both eyes daily as needed.  [name] omeprazole (PRILOSEC) 20 MG capsule Take 20 mg by mouth daily.  . ondansetron (ZOFRAN-ODT) 4 MG disintegrating tablet Dissolve 1 tablet in mouth 4 times daily before meals and at bedtime for nausea  . polyethylene glycol (MIRALAX / GLYCOLAX) packet Take 17 g by mouth daily.    . Polyvinyl Alcohol-Povidone PF 1.4-0.6 % SOLN Apply 1 drop to eye 3 (three) times daily.  [name] senna-docusate (SENOKOT-S) 8.6-50 MG tablet Take 2 tablets by mouth at bedtime.   . [DISCONTINUED] DEXTRAN 70-HYPROMELLOSE OP Apply 1 drop to eye 3 (three) times daily.  . [DISCONTINUED] olopatadine (PATANOL) 0.1 % ophthalmic solution Place 1 drop into both eyes 2 (two) times daily.  . [DISCONTINUED] potassium chloride SA (K-DUR,KLOR-CON) 20 MEQ tablet Take 20 mEq by mouth daily.   No facility-administered encounter medications on file as of 06/28/2017.     Review of Systems   Unobtainable secondary to dementia please see HPI  Immunization History  Administered Date(s) Administered  . Influenza-Unspecified 02/13/2014, 02/09/2016, 02/10/2017  . Pneumococcal Conjugate-13 02/10/2017  . Pneumococcal Polysaccharide-23 02/29/2016  . Tdap 01/30/2017   Pertinent  Health Maintenance Due  Topic Date Due  . INFLUENZA VACCINE  Completed  . DEXA SCAN  Completed  . PNA vac Low Risk Adult  Completed   Fall Risk  01/26/2017  Falls in the past year? No   Functional Status Survey:    Vitals:   06/28/17 1040  BP: 118/70  Pulse: 76  Resp: 18  Temp: 98.4 F (36.9 C)  TempSrc: Oral  SpO2: 94%    Physical Exam   In general this is a somewhat frail elderly female in no distress sitting comfortably in her wheelchair.  Her skin is warm and dry she does have some bruising noted on both arms bilaterally left elbow there is a bruise with what appears to be some surrounding erythema slight warmth I did not really appreciate tenderness I do not note any deformity of the area she also appears to have a  small hematoma inferior to this area.  Heart was difficult to auscultate secondary to patient talking from what I could hear what appeared to be regular and radial pulses regular.  Chest again poor respiratory effort but could not appreciate any congestion or labored breathing.  Abdomen is soft does not appear to be tender there are positive bowel sounds.  Musculoskeletal is able to move all extremities x4 with trace edema bilaterally which appears to be more dependent edema-she does not really follow verbal commands which limits exam. Holds them somewhat in a contracted position which is not new  Neurologic she is alert but does not follow any commands.  Psych as noted above appears to move her upper extremities at baseline  Labs reviewed: Recent Labs    04/10/17 1200 04/12/17 0710 05/04/17 0900 05/10/17 0800  NA 146* 148* 143  --   K 4.2 3.8 4.2  --  CL 110 113* 106  --   CO2 26 27 25   --   GLUCOSE 170* 120* 92  --   BUN 58* 40* 61*  --   CREATININE 2.36* 2.10* 2.26*  --   CALCIUM 9.9 9.9 9.8  --   MG  --   --   --  2.7*   Recent Labs    04/10/17 1200 04/12/17 0710  AST 60* 30  ALT 68* 50  ALKPHOS 87 72  BILITOT 0.5 0.7  PROT 8.4* 7.7  ALBUMIN 4.4 4.0   Recent Labs    08/25/16 0700 04/10/17 1200 04/12/17 0710  WBC 5.2 12.4* 8.0  NEUTROABS 3.0 10.3* 6.0  HGB 12.6 14.0 14.0  HCT 38.8 46.1* 46.2*  MCV 89.4 94.9 95.3  PLT 222 244 215   Lab Results  Component Value Date   TSH 0.823 04/10/2017   Lab Results  Component Value Date   HGBA1C  05/17/2007    5.4 (NOTE)   The ADA recommends the following therapeutic goals for glycemic   control related to Hgb A1C measurement:   Goal of Therapy:   < 7.0% Hgb A1C   Action Suggested:  > 8.0% Hgb A1C   Ref:  Diabetes Care, 22, Suppl. 1, 1999   Lab Results  Component Value Date   CHOL  05/22/2007    175        ATP III CLASSIFICATION:  <200     mg/dL   Desirable  200-239  mg/dL   Borderline High  >=240    mg/dL    High   HDL 37 (L) 05/22/2007   LDLCALC  05/22/2007    96        Total Cholesterol/HDL:CHD Risk Coronary Heart Disease Risk Table                     Men   Women  1/2 Average Risk   3.4   3.3   TRIG 208 (H) 05/22/2007   CHOLHDL 4.7 05/22/2007    Significant Diagnostic Results in last 30 days:  No results found.  Assessment/Plan  #1-history of suspected cellulitis left elbow- will start doxycycline 100 mg twice daily for 7 days-also probiotic twice daily for 10 days-continue to monitor for changes.  2.  History of severe dementia she appears to be doing relatively well with supportive care-despite having a somewhat poor appetite her weight actually has stabilized at 149 pounds --which actually is a gain of about 10 pounds since August  #3 dysphagia with gastroparesis she is on Reglan Zofran as well as Prilosec-- this point appears to be working which is probably at least partly attributable for her weight being stable   #4-history of lower extremity edema again this appears to be dependent related-we have assessed this before and this appears to improve when she is in bed--I do not believe weight gain is edema related  #5 hypertension this continues to be stable on Norvasc.  Description she is she is under 90s now  [phone]

## 2017-06-29 DIAGNOSIS — Z9181 History of falling: Secondary | ICD-10-CM | POA: Diagnosis not present

## 2017-06-29 DIAGNOSIS — M6281 Muscle weakness (generalized): Secondary | ICD-10-CM | POA: Diagnosis not present

## 2017-06-29 DIAGNOSIS — F0391 Unspecified dementia with behavioral disturbance: Secondary | ICD-10-CM | POA: Diagnosis not present

## 2017-06-29 DIAGNOSIS — R293 Abnormal posture: Secondary | ICD-10-CM | POA: Diagnosis not present

## 2017-06-30 DIAGNOSIS — R293 Abnormal posture: Secondary | ICD-10-CM | POA: Diagnosis not present

## 2017-06-30 DIAGNOSIS — Z9181 History of falling: Secondary | ICD-10-CM | POA: Diagnosis not present

## 2017-06-30 DIAGNOSIS — F0391 Unspecified dementia with behavioral disturbance: Secondary | ICD-10-CM | POA: Diagnosis not present

## 2017-06-30 DIAGNOSIS — M6281 Muscle weakness (generalized): Secondary | ICD-10-CM | POA: Diagnosis not present

## 2017-07-03 DIAGNOSIS — F0391 Unspecified dementia with behavioral disturbance: Secondary | ICD-10-CM | POA: Diagnosis not present

## 2017-07-03 DIAGNOSIS — R293 Abnormal posture: Secondary | ICD-10-CM | POA: Diagnosis not present

## 2017-07-03 DIAGNOSIS — Z9181 History of falling: Secondary | ICD-10-CM | POA: Diagnosis not present

## 2017-07-03 DIAGNOSIS — M6281 Muscle weakness (generalized): Secondary | ICD-10-CM | POA: Diagnosis not present

## 2017-07-05 ENCOUNTER — Non-Acute Institutional Stay (SKILLED_NURSING_FACILITY): Payer: Medicare Other | Admitting: Internal Medicine

## 2017-07-05 ENCOUNTER — Encounter: Payer: Self-pay | Admitting: Internal Medicine

## 2017-07-05 DIAGNOSIS — T148XXA Other injury of unspecified body region, initial encounter: Secondary | ICD-10-CM

## 2017-07-05 DIAGNOSIS — F0281 Dementia in other diseases classified elsewhere with behavioral disturbance: Secondary | ICD-10-CM

## 2017-07-05 DIAGNOSIS — G300 Alzheimer's disease with early onset: Secondary | ICD-10-CM

## 2017-07-05 DIAGNOSIS — F02818 Dementia in other diseases classified elsewhere, unspecified severity, with other behavioral disturbance: Secondary | ICD-10-CM

## 2017-07-05 NOTE — Progress Notes (Signed)
Location:   [facility] Room Number: 110/W Place of Service:  SNF [phone]) Provider:  Freddi Starr, MD  Patient Care Team: [name], MD as PCP - General (Internal Medicine) [name], [name], MD as Consulting Physician (Gastroenterology)  Extended Emergency Contact Information Primary Emergency Contact: [name] Address: [address]          [place], [place] [address] [name] of [place] Phone: [phone] Work Phone: [phone] Mobile Phone: [phone] Relation: Daughter Secondary Emergency Contact: [name] Address: [address]          [place], [place] [address] [name] of [place] Phone: [phone] Mobile Phone: [phone] Relation: Relative  Code Status: DNR  Goals of care: Advanced Directive information Advanced Directives 07/05/2017  Does Patient Have a Medical Advance Directive? Yes  Type of Advance Directive Out of facility DNR (pink MOST or yellow form)  Does patient want to make changes to medical advance directive? No - Patient declined  Copy of [place] in Chart? No - copy requested  Would patient like information on creating a medical advance directive? No - Patient declined  Pre-existing out of facility DNR order (yellow form or pink MOST form) -     Acute visit secondary to left arm hematoma  HPI:  Pt is a 82 y.o. female seen today for an acute visit for follow-up of a left arm hematoma.  Patient is a long-term resident of the facility with a history of severe dementia as well as chronic kidney disease also has a history of breast cancer with mastectomy also GERD and gastroparesis.  Saw her last week for some suspected cellulitis around her left elbow she has completed a course of doxycycline and this appears to be largely resolved.  However apparently this morning it was noted she did have a hematoma on her left forearm-does not appear to be acutely painful does  not appear to be infected.  Patient cannot give any history because of her severe dementia-I do not see that she is on anticoagulation.       Past Medical History:  Diagnosis Date  . Cancer ([facility])   . Dementia   . Gallstones   . Gastroparalysis   . HTN (hypertension)   . Infiltrating ductal carcinoma of right female breast ([facility]) 12/02/2009   Qualifier: History of  By: [name] CMA [name]), June    . Osteoporosis 11/18/2011  . Reflux   . Renal disorder    renal insufficiency.    Past Surgical History:  Procedure Laterality Date  . BREAST BIOPSY    . BREAST LUMPECTOMY    . COLONOSCOPY  2009   Dr. Fuller Plan: normal  . ESOPHAGOGASTRODUODENOSCOPY  12/11/2009   Dr. Fuller Plan: hiatal hernia  . ESOPHAGOGASTRODUODENOSCOPY N/A 01/29/2014   Procedure: ESOPHAGOGASTRODUODENOSCOPY (EGD);  Surgeon: [name], MD;  [facility];  Service: Endoscopy;  Laterality: N/A;  245  . TONSILLECTOMY AND ADENOIDECTOMY      Allergies  Allergen Reactions  . Aricept [Donepezil Hydrochloride] Nausea And Vomiting  . Exelon [Rivastigmine Tartrate]   . Lithium Nausea And Vomiting    Outpatient Encounter Medications as of 07/05/2017  Medication Sig  . acetaminophen (TYLENOL) 325 MG tablet Take one tablet by mouth at bedtime, take 650 mg by mouth every 4 hours prn  . amLODipine (NORVASC) 2.5 MG tablet Take 2.5 mg by mouth along with 5 mg to = 7.5 mg once a day  . amLODipine (NORVASC)  5 MG tablet Take 5 mg by mouth along with 2.5 mg to = 7.5 mg once a day  . docusate (COLACE) 50 MG/5ML liquid Take 100 mg by mouth daily.  . metoCLOPramide (REGLAN) 5 MG tablet Take 5 mg by mouth 2 (two) times daily.   . Olopatadine HCl 0.2 % SOLN Place 1 drop into both eyes daily as needed.  [name] omeprazole (PRILOSEC) 20 MG capsule Take 20 mg by mouth daily.  . ondansetron (ZOFRAN-ODT) 4 MG disintegrating tablet Dissolve 1 tablet in mouth 4 times daily before meals and at bedtime for nausea  . polyethylene glycol  (MIRALAX / GLYCOLAX) packet Take 17 g by mouth daily.    . Polyvinyl Alcohol-Povidone PF 1.4-0.6 % SOLN Apply 1 drop to eye 3 (three) times daily.  . Probiotic Product (RISA-BID PROBIOTIC) TABS Take 1 tablet by mouth twice a day from 06/29/2017-07/07/2017  . senna-docusate (SENOKOT-S) 8.6-50 MG tablet Take 2 tablets by mouth at bedtime.    No facility-administered encounter medications on file as of 07/05/2017.     Review of Systems    essentially unobtainable secondary to dementia please see HPI     Immunization History  Administered Date(s) Administered  . Influenza-Unspecified 02/13/2014, 02/09/2016, 02/10/2017  . Pneumococcal Conjugate-13 02/10/2017  . Pneumococcal Polysaccharide-23 02/29/2016  . Tdap 01/30/2017   Pertinent  Health Maintenance Due  Topic Date Due  . INFLUENZA VACCINE  Completed  . DEXA SCAN  Completed  . PNA vac Low Risk Adult  Completed   Fall Risk  01/26/2017  Falls in the past year? No   Functional Status Survey:    Vitals:   07/05/17 1052  BP: 112/70  Pulse: 69  Resp: 20  Temp: (!) 97.2 F (36.2 C)  TempSrc: Oral  SpO2: 100%    Physical Exam   In general this is a frail elderly female in no distress sitting in her wheelchair.  Her skin is warm and dry she does have some baseline bruising noted on her arms this is not new however on her left forearm there is a raised violaceous hematoma approximately 4 cm in length and 2 cm in width  There is no drainage or surrounding erythema or sign of infection.  Eyes patient tried to hold her eyes pretty closely tight so this was difficult to assess.  Same with her mouth.  Chest is clear to auscultation with poor respiratory effort.  Heart distant heart sounds regular rate and rhythm she has some mild lower extremity edema I suspect this is dependent edema this does not appear to be change from baseline.  Her abdomen soft does not appear to be tender there are positive bowel  sounds.  Musculoskeletal does move all extremities x4-is holding her arms in somewhat of a contracted position with gentle passive range of motion of her left arm I could not really elicit any pain at the elbow or forearm area --there is no apparent deformity radial pulses intact.  Neurologic she is alert but does not follow commands.  Psych findings consistent with severe dementia.      Labs reviewed: Recent Labs    04/10/17 1200 04/12/17 0710 05/04/17 0900 05/10/17 0800  NA 146* 148* 143  --   K 4.2 3.8 4.2  --   CL 110 113* 106  --   CO2 26 27 25   --   GLUCOSE 170* 120* 92  --   BUN 58* 40* 61*  --   CREATININE 2.36* 2.10* 2.26*  --  CALCIUM 9.9 9.9 9.8  --   MG  --   --   --  2.7*   Recent Labs    04/10/17 1200 04/12/17 0710  AST 60* 30  ALT 68* 50  ALKPHOS 87 72  BILITOT 0.5 0.7  PROT 8.4* 7.7  ALBUMIN 4.4 4.0   Recent Labs    08/25/16 0700 04/10/17 1200 04/12/17 0710  WBC 5.2 12.4* 8.0  NEUTROABS 3.0 10.3* 6.0  HGB 12.6 14.0 14.0  HCT 38.8 46.1* 46.2*  MCV 89.4 94.9 95.3  PLT 222 244 215   Lab Results  Component Value Date   TSH 0.823 04/10/2017   Lab Results  Component Value Date   HGBA1C  05/17/2007    5.4 (NOTE)   The ADA recommends the following therapeutic goals for glycemic   control related to Hgb A1C measurement:   Goal of Therapy:   < 7.0% Hgb A1C   Action Suggested:  > 8.0% Hgb A1C   Ref:  Diabetes Care, 22, Suppl. 1, 1999   Lab Results  Component Value Date   CHOL  05/22/2007    175        ATP III CLASSIFICATION:  <200     mg/dL   Desirable  200-239  mg/dL   Borderline High  >=240    mg/dL   High   HDL 37 (L) 05/22/2007   LDLCALC  05/22/2007    96        Total Cholesterol/HDL:CHD Risk Coronary Heart Disease Risk Table                     Men   Women  1/2 Average Risk   3.4   3.3   TRIG 208 (H) 05/22/2007   CHOLHDL 4.7 05/22/2007    Significant Diagnostic Results in last 30 days:  No results  found.  Assessment/Plan  #1 hematoma left arm-at this point will monitor-her daughter does not desire aggressive interventions will order warm compresses and monitor for any changes I do not see any increased hematomas or bruising from baseline elsewhere.  Do not see signs of cellulitis-or any musculoskeletal deformity or increased pain-again will monitor and will treat conservatively for now.  [id number]

## 2017-07-06 DIAGNOSIS — M6281 Muscle weakness (generalized): Secondary | ICD-10-CM | POA: Diagnosis not present

## 2017-07-06 DIAGNOSIS — F0391 Unspecified dementia with behavioral disturbance: Secondary | ICD-10-CM | POA: Diagnosis not present

## 2017-07-06 DIAGNOSIS — Z9181 History of falling: Secondary | ICD-10-CM | POA: Diagnosis not present

## 2017-07-06 DIAGNOSIS — R293 Abnormal posture: Secondary | ICD-10-CM | POA: Diagnosis not present

## 2017-07-07 ENCOUNTER — Encounter: Payer: Self-pay | Admitting: Internal Medicine

## 2017-07-07 ENCOUNTER — Non-Acute Institutional Stay (SKILLED_NURSING_FACILITY): Payer: Medicare Other | Admitting: Internal Medicine

## 2017-07-07 DIAGNOSIS — G300 Alzheimer's disease with early onset: Secondary | ICD-10-CM

## 2017-07-07 DIAGNOSIS — F0281 Dementia in other diseases classified elsewhere with behavioral disturbance: Secondary | ICD-10-CM

## 2017-07-07 DIAGNOSIS — R1314 Dysphagia, pharyngoesophageal phase: Secondary | ICD-10-CM

## 2017-07-07 DIAGNOSIS — I10 Essential (primary) hypertension: Secondary | ICD-10-CM | POA: Diagnosis not present

## 2017-07-07 DIAGNOSIS — F02818 Dementia in other diseases classified elsewhere, unspecified severity, with other behavioral disturbance: Secondary | ICD-10-CM

## 2017-07-07 DIAGNOSIS — N289 Disorder of kidney and ureter, unspecified: Secondary | ICD-10-CM | POA: Diagnosis not present

## 2017-07-07 DIAGNOSIS — C50911 Malignant neoplasm of unspecified site of right female breast: Secondary | ICD-10-CM

## 2017-07-07 NOTE — Progress Notes (Signed)
Location:   [place] Room Number: 110/W Place of Service:  SNF (31) Provider:  Freddi Starr, MD  Patient Care Team: [name], MD as PCP - General (Internal Medicine) [name], [name], MD as Consulting Physician (Gastroenterology)  Extended Emergency Contact Information Primary Emergency Contact: [facility] Address: [address]          [place], [place] [address] [name] of [place] Phone: [phone] Work Phone: [phone] Mobile Phone: [phone] Relation: Daughter Secondary Emergency Contact: [name] Address: [address]          [place], [place] [address] [name] of [place] Phone: [phone] Mobile Phone: [phone] Relation: Relative  Code Status:  DNR Goals of care: Advanced Directive information Advanced Directives 07/07/2017  Does Patient Have a Medical Advance Directive? Yes  Type of Advance Directive Out of facility DNR (pink MOST or yellow form)  Does patient want to make changes to medical advance directive? No - Patient declined  Copy of [place] in Chart? No - copy requested  Would patient like information on creating a medical advance directive? No - Patient declined  Pre-existing out of facility DNR order (yellow form or pink MOST form) -     Chief Complaint  Patient presents with  . Medical Management of Chronic Issues    Routine Visit   For medical management of chronic medical conditions including severe dementia-hypertension-chronic renal disease-history of breast cancer with mastectomy- gastroparesis and GERD.   HPI:  Pt is a 82 y.o. female seen today for medical management of chronic diseases.  As noted above.  Most recent acute issue was some suspected cellulitis of her left arm which appears to have resolved with doxycycline- she also was seen earlier this week for a hematoma on her left forearm-this appears to be relatively stable on reassessment  today.  She appears to be at her baseline today does not really respond to verbal commands-her weight is stable 149 pounds.  She takes fluids fairly well but does not really eat-.  She does have a significant history of gastroparesis but this has improved with routine Reglan she is also on Zofran as well as Prilosec.  [name]  She also has a history of renal insufficiency we have been conservative obtaining labs secondary to her daughter's wishes for conservative comfort care-metabolic panel back in late January 2018 showed a creatinine of 2.26 BUN of 61 which is in the higher end of her baseline.       Past Medical History:  Diagnosis Date  . Cancer ([place])   . Dementia   . Gallstones   . Gastroparalysis   . HTN (hypertension)   . Infiltrating ductal carcinoma of right female breast ([place]) 12/02/2009   Qualifier: History of  By: [name] CMA [name]), June    . Osteoporosis 11/18/2011  . Reflux   . Renal disorder    renal insufficiency.    Past Surgical History:  Procedure Laterality Date  . BREAST BIOPSY    . BREAST LUMPECTOMY    . COLONOSCOPY  2009   Dr. Fuller Plan: normal  . ESOPHAGOGASTRODUODENOSCOPY  12/11/2009   Dr. Fuller Plan: hiatal hernia  . ESOPHAGOGASTRODUODENOSCOPY N/A 01/29/2014   Procedure: ESOPHAGOGASTRODUODENOSCOPY (EGD);  Surgeon: [name], MD;  [facility];  Service: Endoscopy;  Laterality: N/A;  245  . TONSILLECTOMY AND ADENOIDECTOMY      Allergies  Allergen Reactions  . Aricept [Donepezil Hydrochloride] Nausea And Vomiting  .  Exelon [Rivastigmine Tartrate]   . Lithium Nausea And Vomiting     Outpatient Encounter Medications as of 07/07/2017  Medication Sig  . acetaminophen (TYLENOL) 325 MG tablet Take one tablet by mouth at bedtime, take 650 mg by mouth every 4 hours prn  . amLODipine (NORVASC) 2.5 MG tablet Take 2.5 mg by mouth along with 5 mg to = 7.5 mg once a day  . amLODipine (NORVASC) 5 MG tablet Take 5 mg by mouth along with 2.5 mg to = 7.5  mg once a day  . docusate (COLACE) 50 MG/5ML liquid Take 100 mg by mouth daily.  . metoCLOPramide (REGLAN) 5 MG tablet Take 5 mg by mouth 2 (two) times daily.   . Olopatadine HCl 0.2 % SOLN Place 1 drop into both eyes daily as needed.  [name] omeprazole (PRILOSEC) 20 MG capsule Take 20 mg by mouth daily.  . ondansetron (ZOFRAN-ODT) 4 MG disintegrating tablet Dissolve 1 tablet in mouth 4 times daily before meals and at bedtime for nausea  . polyethylene glycol (MIRALAX / GLYCOLAX) packet Take 17 g by mouth daily.    . Polyvinyl Alcohol-Povidone PF 1.4-0.6 % SOLN Apply 1 drop to eye 3 (three) times daily.  . Probiotic Product (RISA-BID PROBIOTIC) TABS Take 1 tablet by mouth twice a day from 06/29/2017-07/07/2017  . senna-docusate (SENOKOT-S) 8.6-50 MG tablet Take 2 tablets by mouth at bedtime.    No facility-administered encounter medications on file as of 07/07/2017.      Review of Systems   Unobtainable secondary to dementia  Immunization History  Administered Date(s) Administered  . Influenza-Unspecified 02/13/2014, 02/09/2016, 02/10/2017  . Pneumococcal Conjugate-13 02/10/2017  . Pneumococcal Polysaccharide-23 02/29/2016  . Tdap 01/30/2017   Pertinent  Health Maintenance Due  Topic Date Due  . INFLUENZA VACCINE  Completed  . DEXA SCAN  Completed  . PNA vac Low Risk Adult  Completed   Fall Risk  01/26/2017  Falls in the past year? No   Functional Status Survey:    Vitals:   07/07/17 1307  BP: 112/70  Pulse: 69  Resp: 20  Temp: (!) 97.2 F (36.2 C)  TempSrc: Oral  SpO2: 100%  Weight: 149 lb (67.6 kg)  Height: 5\' 5"  (1.651 m)   Body mass index is 24.79 kg/m. Physical Exam   In general this is a frail-appearing elderly female in no distress sitting comfortably in a wheelchair.  Her skin is warm and dry she does have bruising which appears unchanged on her arms-the hematoma on her left forearm continues to be somewhat violaceous and slowly resolving.  No sign of infection  at this point.  Eyes her sclera and conjunctive appear clear visual acuity appears to be intact.  Oropharynx mucous membranes appear to be moist oropharynx is clear.  Chest is clear to auscultation there is poor respiratory effort since she does not follow verbal commands.  Heart is regular rate and rhythm without murmur gallop or rub she has mild what I suspect dependent lower extremity edema.  Her abdomen is soft does not appear to be tender there are positive bowel sounds.  Musculoskeletal moves all her extremities at baseline continues to hold her arms in somewhat of a contracted position.  I do not note any deformities.  Neurologic she is alert appears to move her extremities at baseline could not really sees lateralizing findings.  Psych again findings consistent with advanced dementia she does not follow any verbal commands.    Labs reviewed: Recent Labs  04/10/17 1200 04/12/17 0710 05/04/17 0900 05/10/17 0800  NA 146* 148* 143  --   K 4.2 3.8 4.2  --   CL 110 113* 106  --   CO2 26 27 25   --   GLUCOSE 170* 120* 92  --   BUN 58* 40* 61*  --   CREATININE 2.36* 2.10* 2.26*  --   CALCIUM 9.9 9.9 9.8  --   MG  --   --   --  2.7*   Recent Labs    04/10/17 1200 04/12/17 0710  AST 60* 30  ALT 68* 50  ALKPHOS 87 72  BILITOT 0.5 0.7  PROT 8.4* 7.7  ALBUMIN 4.4 4.0   Recent Labs    08/25/16 0700 04/10/17 1200 04/12/17 0710  WBC 5.2 12.4* 8.0  NEUTROABS 3.0 10.3* 6.0  HGB 12.6 14.0 14.0  HCT 38.8 46.1* 46.2*  MCV 89.4 94.9 95.3  PLT 222 244 215   Lab Results  Component Value Date   TSH 0.823 04/10/2017   Lab Results  Component Value Date   HGBA1C  05/17/2007    5.4 (NOTE)   The ADA recommends the following therapeutic goals for glycemic   control related to Hgb A1C measurement:   Goal of Therapy:   < 7.0% Hgb A1C   Action Suggested:  > 8.0% Hgb A1C   Ref:  Diabetes Care, 22, Suppl. 1, 1999   Lab Results  Component Value Date   CHOL  05/22/2007     175        ATP III CLASSIFICATION:  <200     mg/dL   Desirable  200-239  mg/dL   Borderline High  >=240    mg/dL   High   HDL 37 (L) 05/22/2007   LDLCALC  05/22/2007    96        Total Cholesterol/HDL:CHD Risk Coronary Heart Disease Risk Table                     Men   Women  1/2 Average Risk   3.4   3.3   TRIG 208 (H) 05/22/2007   CHOLHDL 4.7 05/22/2007    Significant Diagnostic Results in last 30 days:  No results found.  Assessment/Plan  #1 history of advanced dementia again continue supportive care her weight appears to be stabilized-her daughter wishes largely comfort care with minimal invasive procedures and labs.   #2 history of dysphasia and gastroparesis- being on Reglan regularly appears to have helped she has also received Zofran and Prilosec with associated GERD-this appears to have stabilized her weight has stabilized  #3 history of renal insufficiency creatinine is at the higher end of her baseline again her daughter wishes conservative lab draws-at this point continue to encourage p.o. intake and she apparently does take fluids well.  4.  Hypertension this appears stable she is on Norvasc 7.5 mg a day-- on the larger dose of 10 mg a day there were some concerns for lower blood pressures.  5.  History of breast cancer with mastectomy-she is not currently on treatment per family wishes for conservative care and minimal medications.  6.  History of lower extremity edema this appears to be dependent edema and fairly stable with previous exams.  [id number]

## 2017-07-08 ENCOUNTER — Encounter: Payer: Self-pay | Admitting: Internal Medicine

## 2017-07-10 ENCOUNTER — Non-Acute Institutional Stay (SKILLED_NURSING_FACILITY): Payer: Medicare Other | Admitting: Internal Medicine

## 2017-07-10 ENCOUNTER — Encounter: Payer: Self-pay | Admitting: Internal Medicine

## 2017-07-10 DIAGNOSIS — G300 Alzheimer's disease with early onset: Secondary | ICD-10-CM

## 2017-07-10 DIAGNOSIS — F0281 Dementia in other diseases classified elsewhere with behavioral disturbance: Secondary | ICD-10-CM | POA: Diagnosis not present

## 2017-07-10 DIAGNOSIS — T148XXA Other injury of unspecified body region, initial encounter: Secondary | ICD-10-CM | POA: Diagnosis not present

## 2017-07-10 DIAGNOSIS — F02818 Dementia in other diseases classified elsewhere, unspecified severity, with other behavioral disturbance: Secondary | ICD-10-CM

## 2017-07-10 NOTE — Progress Notes (Signed)
Location:   [place] Room Number: 110/W Place of Service:  SNF (31) Provider:  Clydene Fake, MD  Patient Care Team: [name], MD as PCP - General (Internal Medicine) [name] [name], MD as Consulting Physician (Gastroenterology)  Extended Emergency Contact Information Primary Emergency Contact: [facility] Address: [address]          [place], [place] [address] [name] of [place] Phone: [phone] Work Phone: [phone] Mobile Phone: [phone] Relation: Daughter Secondary Emergency Contact: [name] Address: [address]          [place], [place] [address] [name] of [place] Phone: [phone] Mobile Phone: [phone] Relation: Relative  Code Status:  DNR Goals of care: Advanced Directive information Advanced Directives 07/10/2017  Does Patient Have a Medical Advance Directive? Yes  Type of Advance Directive Out of facility DNR (pink MOST or yellow form)  Does patient want to make changes to medical advance directive? No - Patient declined  Copy of [place] in Chart? No - copy requested  Would patient like information on creating a medical advance directive? No - Patient declined  Pre-existing out of facility DNR order (yellow form or pink MOST form) -     Chief Complaint  Patient presents with  . Acute Visit    Patients Follow Swelling  in left arm    HPI:  Pt is a 82 y.o. female seen today for an acute visit for Swelling in left arm  Patient has h/o hypertension, Advanced Dementia, Chronic renal disease, Breast cancer s/p Mastectomy, GERD and Gastroparesis. Patient is long term Resident of facility. Patient has dementia and unable to give any history. She was noticed to have some swelling with redness and pain  in her Left Arm and was started on Doxycyline for 7 days. Since then redness has resolved but she continues to have swelling. And it seems it is  painful. Her daughter wanted [place] to revaluate her. Patients Vitals are stable.With No Fever or chills. No H/O Falls or any Injury to that Hand. Past Medical History:  Diagnosis Date  . Cancer ([place])   . Dementia   . Gallstones   . Gastroparalysis   . HTN (hypertension)   . Infiltrating ductal carcinoma of right female breast ([place]) 12/02/2009   Qualifier: History of  By: [name] CMA [name]), June    . Osteoporosis 11/18/2011  . Reflux   . Renal disorder    renal insufficiency.    Past Surgical History:  Procedure Laterality Date  . BREAST BIOPSY    . BREAST LUMPECTOMY    . COLONOSCOPY  2009   Dr. Fuller Plan: normal  . ESOPHAGOGASTRODUODENOSCOPY  12/11/2009   Dr. Fuller Plan: hiatal hernia  . ESOPHAGOGASTRODUODENOSCOPY N/A 01/29/2014   Procedure: ESOPHAGOGASTRODUODENOSCOPY (EGD);  Surgeon: [name], MD;  [facility];  Service: Endoscopy;  Laterality: N/A;  245  . TONSILLECTOMY AND ADENOIDECTOMY      Allergies  Allergen Reactions  . Aricept [Donepezil Hydrochloride] Nausea And Vomiting  . Exelon [Rivastigmine Tartrate]   . Lithium Nausea And Vomiting    Outpatient Encounter Medications as of 07/10/2017  Medication Sig  . acetaminophen (TYLENOL) 325 MG tablet Take one tablet by mouth at bedtime, take 650 mg by mouth every 4 hours prn  . amLODipine (NORVASC) 2.5 MG tablet Take 2.5 mg by mouth along with 5 mg to = 7.5 mg once a day  . amLODipine (NORVASC) 5 MG tablet Take  5 mg by mouth along with 2.5 mg to = 7.5 mg once a day  . docusate (COLACE) 50 MG/5ML liquid Take 100 mg by mouth daily.  . metoCLOPramide (REGLAN) 5 MG tablet Take 5 mg by mouth 2 (two) times daily.   . Olopatadine HCl 0.2 % SOLN Place 1 drop into both eyes daily as needed.  [name] omeprazole (PRILOSEC) 20 MG capsule Take 20 mg by mouth daily.  . ondansetron (ZOFRAN-ODT) 4 MG disintegrating tablet Dissolve 1 tablet in mouth 4 times daily before meals and at bedtime for nausea  . polyethylene glycol (MIRALAX /  GLYCOLAX) packet Take 17 g by mouth daily.    . Polyvinyl Alcohol-Povidone PF 1.4-0.6 % SOLN Apply 1 drop to eye 3 (three) times daily.  [name] senna-docusate (SENOKOT-S) 8.6-50 MG tablet Take 2 tablets by mouth at bedtime.   . [DISCONTINUED] Probiotic Product (RISA-BID PROBIOTIC) TABS Take 1 tablet by mouth twice a day from 06/29/2017-07/07/2017   No facility-administered encounter medications on file as of 07/10/2017.      Review of Systems  Unable to perform ROS: Dementia   Review of Systems   Immunization History  Administered Date(s) Administered  . Influenza-Unspecified 02/13/2014, 02/09/2016, 02/10/2017  . Pneumococcal Conjugate-13 02/10/2017  . Pneumococcal Polysaccharide-23 02/29/2016  . Tdap 01/30/2017   Pertinent  Health Maintenance Due  Topic Date Due  . INFLUENZA VACCINE  Completed  . DEXA SCAN  Completed  . PNA vac Low Risk Adult  Completed   Fall Risk  01/26/2017  Falls in the past year? No   Functional Status Survey:    Vitals:   07/10/17 1515  BP: (!) 145/69  Pulse: 64  Resp: 18  Temp: 98.5 F (36.9 C)  TempSrc: Oral  SpO2: 100%   There is no height or weight on file to calculate BMI. Physical Exam  Constitutional: She appears well-developed.  HENT:  Head: Normocephalic.  Mouth/Throat: Oropharynx is clear and moist.  Eyes: Pupils are equal, round, and reactive to light.  Neck: Neck supple.  Cardiovascular: Normal rate and normal heart sounds.  Pulmonary/Chest: Effort normal and breath sounds normal. No respiratory distress. She has no wheezes.  Abdominal: Soft. Bowel sounds are normal. She exhibits no distension. There is no tenderness.  Musculoskeletal: She exhibits edema.  Swelling in Left forearm. Tender. Not red. No Open area or discharge.  Neurological: She is alert.    Labs reviewed: Recent Labs    04/10/17 1200 04/12/17 0710 05/04/17 0900 05/10/17 0800  NA 146* 148* 143  --   K 4.2 3.8 4.2  --   CL 110 113* 106  --   CO2 26 27 25   --    GLUCOSE 170* 120* 92  --   BUN 58* 40* 61*  --   CREATININE 2.36* 2.10* 2.26*  --   CALCIUM 9.9 9.9 9.8  --   MG  --   --   --  2.7*   Recent Labs    04/10/17 1200 04/12/17 0710  AST 60* 30  ALT 68* 50  ALKPHOS 87 72  BILITOT 0.5 0.7  PROT 8.4* 7.7  ALBUMIN 4.4 4.0   Recent Labs    08/25/16 0700 04/10/17 1200 04/12/17 0710  WBC 5.2 12.4* 8.0  NEUTROABS 3.0 10.3* 6.0  HGB 12.6 14.0 14.0  HCT 38.8 46.1* 46.2*  MCV 89.4 94.9 95.3  PLT 222 244 215   Lab Results  Component Value Date   TSH 0.823 04/10/2017   Lab Results  Component Value Date   HGBA1C  05/17/2007    5.4 (NOTE)   The ADA recommends the following therapeutic goals for glycemic   control related to Hgb A1C measurement:   Goal of Therapy:   < 7.0% Hgb A1C   Action Suggested:  > 8.0% Hgb A1C   Ref:  Diabetes Care, 22, Suppl. 1, 1999   Lab Results  Component Value Date   CHOL  05/22/2007    175        ATP III CLASSIFICATION:  <200     mg/dL   Desirable  200-239  mg/dL   Borderline High  >=240    mg/dL   High   HDL 37 (L) 05/22/2007   LDLCALC  05/22/2007    96        Total Cholesterol/HDL:CHD Risk Coronary Heart Disease Risk Table                     Men   Women  1/2 Average Risk   3.4   3.3   TRIG 208 (H) 05/22/2007   CHOLHDL 4.7 05/22/2007    Significant Diagnostic Results in last 30 days:  No results found.  Assessment/Plan  Left Forearm Swelling ? Hematoma Seen with Wound care Nurse At this time I don't think it is infected. The redness is resolved and per nurses the size of swelling is decreased. Will put Foam Dressing on it to avoid repeated rubbing against wheelchair. The size was marked. Will continue to monitor and follow in few days.   Family/ staff Communication:   Labs/tests ordered:   Total time spent in this patient care encounter was 25_ minutes; greater than 50% of the visit spent, reviewing records , Labs and coordinating care for problems addressed at this encounter.

## 2017-07-13 DIAGNOSIS — F0391 Unspecified dementia with behavioral disturbance: Secondary | ICD-10-CM | POA: Diagnosis not present

## 2017-07-13 DIAGNOSIS — R293 Abnormal posture: Secondary | ICD-10-CM | POA: Diagnosis not present

## 2017-07-13 DIAGNOSIS — Z9181 History of falling: Secondary | ICD-10-CM | POA: Diagnosis not present

## 2017-07-13 DIAGNOSIS — M6281 Muscle weakness (generalized): Secondary | ICD-10-CM | POA: Diagnosis not present

## 2017-07-24 ENCOUNTER — Non-Acute Institutional Stay (SKILLED_NURSING_FACILITY): Payer: Medicare Other | Admitting: Internal Medicine

## 2017-07-24 ENCOUNTER — Encounter (HOSPITAL_COMMUNITY)
Admission: RE | Admit: 2017-07-24 | Discharge: 2017-07-24 | Disposition: A | Discharge status: Home / Self Care | Payer: Medicare Other | Source: Skilled Nursing Facility | Attending: Internal Medicine | Admitting: Internal Medicine

## 2017-07-24 ENCOUNTER — Encounter: Payer: Self-pay | Admitting: Internal Medicine

## 2017-07-24 DIAGNOSIS — R509 Fever, unspecified: Secondary | ICD-10-CM

## 2017-07-24 DIAGNOSIS — R198 Other specified symptoms and signs involving the digestive system and abdomen: Secondary | ICD-10-CM | POA: Diagnosis not present

## 2017-07-24 DIAGNOSIS — R5383 Other fatigue: Secondary | ICD-10-CM | POA: Diagnosis not present

## 2017-07-24 DIAGNOSIS — K3184 Gastroparesis: Secondary | ICD-10-CM | POA: Insufficient documentation

## 2017-07-24 LAB — COMPREHENSIVE METABOLIC PANEL
ALK PHOS: 79 U/L (ref 38–126)
ALT: 13 U/L — ABNORMAL LOW (ref 14–54)
AST: 23 U/L (ref 15–41)
Albumin: 3.6 g/dL (ref 3.5–5.0)
Anion gap: 11 (ref 5–15)
BILIRUBIN TOTAL: 0.5 mg/dL (ref 0.3–1.2)
BUN: 55 mg/dL — ABNORMAL HIGH (ref 6–20)
CALCIUM: 8.8 mg/dL — AB (ref 8.9–10.3)
CO2: 23 mmol/L (ref 22–32)
Chloride: 102 mmol/L (ref 101–111)
Creatinine, Ser: 1.98 mg/dL — ABNORMAL HIGH (ref 0.44–1.00)
GFR calc non Af Amer: 23 mL/min — ABNORMAL LOW (ref >60)
GFR, EST AFRICAN AMERICAN: 26 mL/min — AB (ref >60)
Glucose, Bld: 147 mg/dL — ABNORMAL HIGH (ref 65–99)
Potassium: 3.9 mmol/L (ref 3.5–5.1)
Sodium: 136 mmol/L (ref 135–145)
TOTAL PROTEIN: 7 g/dL (ref 6.5–8.1)

## 2017-07-24 LAB — CBC WITH DIFFERENTIAL/PLATELET
Basophils Absolute: 0 10*3/uL (ref 0.0–0.1)
Basophils Relative: 0 %
Eosinophils Absolute: 0 10*3/uL (ref 0.0–0.7)
Eosinophils Relative: 0 %
HEMATOCRIT: 32.4 % — AB (ref 36.0–46.0)
HEMOGLOBIN: 10 g/dL — AB (ref 12.0–15.0)
LYMPHS ABS: 0.5 10*3/uL — AB (ref 0.7–4.0)
Lymphocytes Relative: 7 %
MCH: 28.6 pg (ref 26.0–34.0)
MCHC: 30.9 g/dL (ref 30.0–36.0)
MCV: 92.6 fL (ref 78.0–100.0)
MONOS PCT: 6 %
Monocytes Absolute: 0.5 10*3/uL (ref 0.1–1.0)
NEUTROS ABS: 7.2 10*3/uL (ref 1.7–7.7)
NEUTROS PCT: 87 %
Platelets: 129 10*3/uL — ABNORMAL LOW (ref 150–400)
RBC: 3.5 MIL/uL — ABNORMAL LOW (ref 3.87–5.11)
RDW: 14 % (ref 11.5–15.5)
WBC: 8.2 10*3/uL (ref 4.0–10.5)

## 2017-07-24 LAB — INFLUENZA PANEL BY PCR (TYPE A & B)
Influenza A By PCR: NEGATIVE
Influenza B By PCR: NEGATIVE

## 2017-07-24 NOTE — Progress Notes (Signed)
Location:   [place] Room Number: 110/W Place of Service:  SNF [phone]) Provider:  Granville Lewis [phone]- [name], MD  Patient Care Team: [name], MD as PCP - General (Internal Medicine) [name] [name], MD as Consulting Physician (Gastroenterology)  Extended Emergency Contact Information Primary Emergency Contact: [facility] Address: [address]          [place], [place] [address] [name] of [place] Phone: [phone] Work Phone: [phone] Mobile Phone: [phone] Relation: Daughter Secondary Emergency Contact: [name] Address: [address]          [place], [place] [address] [name] of [place] Phone: [phone] Mobile Phone: [phone] Relation: Relative  Code Status:  DNR Goals of care: Advanced Directive information Advanced Directives 07/24/2017  Does Patient Have a Medical Advance Directive? Yes  Type of Advance Directive Out of facility DNR (pink MOST or yellow form)  Does patient want to make changes to medical advance directive? No - Patient declined  Copy of [place] in Chart? No - copy requested  Would patient like information on creating a medical advance directive? No - Patient declined  Pre-existing out of facility DNR order (yellow form or pink MOST form) -     Chief complaint acute visit secondary to fever of unknown origin  HPI:  Pt is a 82 y.o. female seen today for an acute visit for follow-up of a fever.  Patient was noted today to be somewhat more lethargic temperature was taken and was noted to be 101.  On recheck it was actually up to 102.2.  Otherwise vital signs appear to be stable-patient has severe dementia and cannot give any review of systems.   Patient is a long-term resident of facility and has end-stage dementia as well as chronic renal disease hypertension history of breast cancer as well as GERD and gastroparesis.  Nursing staff has not noted  any increased complaints of cough or abdominal pain.  Her daughter actually is a dietitian in the facility- apparently this morning patient was relatively at her baseline.  She was recently treated for a skin tear hematoma this does not appear to be an acute situation however.    Past Medical History:  Diagnosis Date  . Cancer ([place])   . Dementia   . Gallstones   . Gastroparalysis   . HTN (hypertension)   . Infiltrating ductal carcinoma of right female breast ([place]) 12/02/2009   Qualifier: History of  By: [name] CMA [name]), June    . Osteoporosis 11/18/2011  . Reflux   . Renal disorder    renal insufficiency.    Past Surgical History:  Procedure Laterality Date  . BREAST BIOPSY    . BREAST LUMPECTOMY    . COLONOSCOPY  2009   Dr. Fuller Plan: normal  . ESOPHAGOGASTRODUODENOSCOPY  12/11/2009   Dr. Fuller Plan: hiatal hernia  . ESOPHAGOGASTRODUODENOSCOPY N/A 01/29/2014   Procedure: ESOPHAGOGASTRODUODENOSCOPY (EGD);  Surgeon: [name], MD;  [facility];  Service: Endoscopy;  Laterality: N/A;  245  . TONSILLECTOMY AND ADENOIDECTOMY      Allergies  Allergen Reactions  . Aricept [Donepezil Hydrochloride] Nausea And Vomiting  . Exelon [Rivastigmine Tartrate]   . Lithium Nausea And Vomiting    Outpatient Encounter Medications as of 07/24/2017  Medication Sig  . acetaminophen (TYLENOL) 325 MG tablet Take one tablet by mouth at bedtime, take 650 mg by mouth every 4 hours prn  . amLODipine (NORVASC) 2.5 MG tablet Take 2.5  mg by mouth along with 5 mg to = 7.5 mg once a day  . amLODipine (NORVASC) 5 MG tablet Take 5 mg by mouth along with 2.5 mg to = 7.5 mg once a day  . docusate (COLACE) 50 MG/5ML liquid Take 100 mg by mouth daily.  . metoCLOPramide (REGLAN) 5 MG tablet Take 5 mg by mouth 2 (two) times daily.   . Olopatadine HCl 0.2 % SOLN Place 1 drop into both eyes daily as needed.  [name] omeprazole (PRILOSEC) 20 MG capsule Take 20 mg by mouth daily.  . ondansetron (ZOFRAN-ODT)  4 MG disintegrating tablet Dissolve 1 tablet in mouth 4 times daily before meals and at bedtime for nausea  . polyethylene glycol (MIRALAX / GLYCOLAX) packet Take 17 g by mouth daily.    . Polyvinyl Alcohol-Povidone PF 1.4-0.6 % SOLN Apply 1 drop to eye 3 (three) times daily.  [name] senna-docusate (SENOKOT-S) 8.6-50 MG tablet Take 2 tablets by mouth at bedtime.    No facility-administered encounter medications on file as of 07/24/2017.     Review of Systems   Essentially unobtainable secondary to dementia please see HPI  Immunization History  Administered Date(s) Administered  . Influenza-Unspecified 02/13/2014, 02/09/2016, 02/10/2017  . Pneumococcal Conjugate-13 02/10/2017  . Pneumococcal Polysaccharide-23 02/29/2016  . Tdap 01/30/2017   Pertinent  Health Maintenance Due  Topic Date Due  . INFLUENZA VACCINE  Completed  . DEXA SCAN  Completed  . PNA vac Low Risk Adult  Completed   Fall Risk  01/26/2017  Falls in the past year? No   Functional Status Survey:    Vitals:   07/24/17 1454  BP: (!) 154/76  Pulse: 79  Resp: (!) 22  Temp: (!) 101.6 F (38.7 C)  TempSrc: Oral  On recheck manual blood pressure was 138/76 pulse was stable O2 saturation was 93% on room air  Physical Exam   In general this is a somewhat frail elderly female who is lying in bed-she is responsive but appears to not be feeling well- somewhat lethargic but not acutely so.  Her skin is warm and dry I could not really appreciate significant warmth beyond baseline.  Hematoma on her left arm is currently covered I do not see any increased surrounding erythema or drainage.  Eyes patient would not really open up her eyes.  Oropharynx limited exam since she would not open her mouth wide but mucous membranes appear fairly moist.  Chest she did not follow verbal commands but I cannot appreciate any congestion there is no labored breathing.  Heart is regular rate and rhythm without murmur gallop or rub she has  mild lower extremity edema which appears relatively baseline.  Abdomen is soft does not appear to be tender bowel sounds are positive.  Musculoskeletal she does not really follow verbal commands but appears able to move her extremities at baseline with some general frailty.  Neurologic again she is somewhat lethargic but responsive could not really appreciate any lateralizing findings holds her arms in somewhat of a contracted position.  Psych as noted above findings consistent with dementia.    Labs reviewed: Recent Labs    04/10/17 1200 04/12/17 0710 05/04/17 0900 05/10/17 0800  NA 146* 148* 143  --   K 4.2 3.8 4.2  --   CL 110 113* 106  --   CO2 26 27 25   --   GLUCOSE 170* 120* 92  --   BUN 58* 40* 61*  --   CREATININE 2.36* 2.10* 2.26*  --  CALCIUM 9.9 9.9 9.8  --   MG  --   --   --  2.7*   Recent Labs    04/10/17 1200 04/12/17 0710  AST 60* 30  ALT 68* 50  ALKPHOS 87 72  BILITOT 0.5 0.7  PROT 8.4* 7.7  ALBUMIN 4.4 4.0   Recent Labs    08/25/16 0700 04/10/17 1200 04/12/17 0710  WBC 5.2 12.4* 8.0  NEUTROABS 3.0 10.3* 6.0  HGB 12.6 14.0 14.0  HCT 38.8 46.1* 46.2*  MCV 89.4 94.9 95.3  PLT 222 244 215   Lab Results  Component Value Date   TSH 0.823 04/10/2017   Lab Results  Component Value Date   HGBA1C  05/17/2007    5.4 (NOTE)   The ADA recommends the following therapeutic goals for glycemic   control related to Hgb A1C measurement:   Goal of Therapy:   < 7.0% Hgb A1C   Action Suggested:  > 8.0% Hgb A1C   Ref:  Diabetes Care, 22, Suppl. 1, 1999   Lab Results  Component Value Date   CHOL  05/22/2007    175        ATP III CLASSIFICATION:  <200     mg/dL   Desirable  200-239  mg/dL   Borderline High  >=240    mg/dL   High   HDL 37 (L) 05/22/2007   LDLCALC  05/22/2007    96        Total Cholesterol/HDL:CHD Risk Coronary Heart Disease Risk Table                     Men   Women  1/2 Average Risk   3.4   3.3   TRIG 208 (H) 05/22/2007   CHOLHDL  4.7 05/22/2007    Significant Diagnostic Results in last 30 days:  No results found.  Assessment/Plan  #1 fever of unknown origin-challenging situation with patient really unable to give any history here- did speak with her nurse who is quite familiar with her as well as with her daughter- will obtain a flu swab she did have a positive flu culture last year.  Also will obtain a chest x-ray to rule out respiratory etiology and also will obtain an abdominal x-ray as well.  Will obtain a CBC with differential and CMP stat and notify provider of results.  Monitor vital signs pulse ox every 4 hours for now.  I did speak with her daughter about obtaining a urinalysis and culture- this is somewhat of a challenging issue with the patient is quite resistant to this being obtained-at this point will await lab results and radiology studies before pursuing this.  Again she will need to be monitored closely as noted above.  Addendum.  We have obtain results of the flu swab which is negative.--We will also obtain blood work which shows a white count is normal at 8.2 platelets slightly low at 129 hemoglobin is 10.0 which is down from her baseline which appears to run between 12 and 14 this anemia is normocytic   Metabolic panel appears relatively baseline with a sodium of 136 potassium 3.9 creatinine of 1.98 and a BUN of 55 appears to be relatively stable-liver function tests are essentially unremarkable.  At this point will await the radiology studies continue to monitor carefully-also will order guaiac stools x3 secondary to her hemoglobin being down.      I did reassess patient later in the afternoon she appeared to be stable she  actually had a minimal temperature slightly above 99- she is not tachycardic she does not appear to be uncomfortable still continues to appear weak and frail-appearing however.  Of note greater than 40 minutes spent assessing patient-reviewing her chart and labs and  reviewing updated labs discussing her status with her daughter at bedside as well as with nursing staff- and coordinating and formulating a plan of care-of note greater than 50% of time spent coordinating plan of care with input as noted above

## 2017-07-25 ENCOUNTER — Encounter: Payer: Self-pay | Admitting: Internal Medicine

## 2017-07-25 ENCOUNTER — Encounter (HOSPITAL_COMMUNITY)
Admission: RE | Admit: 2017-07-25 | Discharge: 2017-07-25 | Disposition: A | Discharge status: Home / Self Care | Payer: Medicare Other | Source: Skilled Nursing Facility | Attending: *Deleted | Admitting: *Deleted

## 2017-07-25 ENCOUNTER — Non-Acute Institutional Stay (SKILLED_NURSING_FACILITY): Payer: Medicare Other | Admitting: Internal Medicine

## 2017-07-25 DIAGNOSIS — R509 Fever, unspecified: Secondary | ICD-10-CM

## 2017-07-25 NOTE — Progress Notes (Signed)
Location:   [place] Room Number: 110/W Place of Service:  SNF [phone]) Provider:  Freddi Starr, MD  Patient Care Team: [name], MD as PCP - General (Internal Medicine) [name], [name], MD as Consulting Physician (Gastroenterology)  Extended Emergency Contact Information Primary Emergency Contact: [name] Address: [address]          [place], [place] [address] [name] of [place] Phone: [phone] Work Phone: [phone] Mobile Phone: [phone] Relation: Daughter Secondary Emergency Contact: [name] Address: [address]          [place], [place] [address] [name] of [place] Phone: [phone] Mobile Phone: [phone] Relation: Relative  Code Status:  DNR Goals of care: Advanced Directive information Advanced Directives 07/24/2017  Does Patient Have a Medical Advance Directive? Yes  Type of Advance Directive Out of facility DNR (pink MOST or yellow form)  Does patient want to make changes to medical advance directive? No - Patient declined  Copy of [place] in Chart? No - copy requested  Would patient like information on creating a medical advance directive? No - Patient declined  Pre-existing out of facility DNR order (yellow form or pink MOST form) -     Chief Complaint  Patient presents with  . Acute Visit    F/u Fever    HPI:  Pt is a 82 y.o. female seen today for an acute visit for follow-up of fever of unknown origin noted yesterday.  Patient is a long-term resident of facility with a history of advanced dementia--as well as chronic renal disease hypertension history of breast cancer in addition to GERD and gastroparesis.  She was noted to be somewhat lethargic yesterday not herself vitals were taken which showed an elevated temperature of around 101- recheck of the temperature and actually was up to over 102.  We did discuss aggressiveness of intervention with  her daughter Hardy who is a dietitian in the facility--- and subsequently ordered a chest and abdominal x-ray as well as stat labs.  Chest x-ray did not show any acute process- abdominal x-ray showed mild constipation-labs were fairly unremarkable-- Showing a white count of 8.2 platelets of 129-hemoglobin was down a bit  fromt baseline at 10 and it usually runs around 85-27   Metabolic panel also showed stability with a sodium of 136 potassium 3.9 BUN of 55 and creatinine 1.98-liver function tests were largely within normal limits.  Apparently she is drinking fluids today although her appetite continues to be poor for fluid although this is not totally new she is still somewhat lethargic but responsive.  It is very difficult to get a urine sample from her this causes discomfort for the patient and significant agitation.  I did discuss this with her daughter this morning in the facility- and at this point she is comfortable with empirically starting her on an antibiotic that we she was on recently that was effective-.  Her most recent UTI was E. coli and treated successfully it appears with Cefdinir  She has a temperature of slightly over 99 this morning but has not really appear to have had temperature spikes since yesterday.    Past Medical History:  Diagnosis Date  . Cancer ([place])   . Dementia   . Gallstones   . Gastroparalysis   . HTN (hypertension)   . Infiltrating ductal carcinoma of right female breast ([place]) 12/02/2009   Qualifier: History of  By: [name] CMA [name]), June    .  Osteoporosis 11/18/2011  . Reflux   . Renal disorder    renal insufficiency.    Past Surgical History:  Procedure Laterality Date  . BREAST BIOPSY    . BREAST LUMPECTOMY    . COLONOSCOPY  2009   Dr. Fuller Plan: normal  . ESOPHAGOGASTRODUODENOSCOPY  12/11/2009   Dr. Fuller Plan: hiatal hernia  . ESOPHAGOGASTRODUODENOSCOPY N/A 01/29/2014   Procedure: ESOPHAGOGASTRODUODENOSCOPY (EGD);  Surgeon: [name], MD;   [facility];  Service: Endoscopy;  Laterality: N/A;  245  . TONSILLECTOMY AND ADENOIDECTOMY      Allergies  Allergen Reactions  . Aricept [Donepezil Hydrochloride] Nausea And Vomiting  . Exelon [Rivastigmine Tartrate]   . Lithium Nausea And Vomiting    Outpatient Encounter Medications as of 07/25/2017  Medication Sig  . acetaminophen (TYLENOL) 325 MG tablet Take one tablet by mouth at bedtime, take 650 mg by mouth every 4 hours prn  . amLODipine (NORVASC) 2.5 MG tablet Take 2.5 mg by mouth along with 5 mg to = 7.5 mg once a day  . amLODipine (NORVASC) 5 MG tablet Take 5 mg by mouth along with 2.5 mg to = 7.5 mg once a day  . docusate (COLACE) 50 MG/5ML liquid Take 100 mg by mouth daily.  . metoCLOPramide (REGLAN) 5 MG tablet Take 5 mg by mouth 2 (two) times daily.   . Olopatadine HCl 0.2 % SOLN Place 1 drop into both eyes daily as needed.  [name] omeprazole (PRILOSEC) 20 MG capsule Take 20 mg by mouth daily.  . ondansetron (ZOFRAN-ODT) 4 MG disintegrating tablet Dissolve 1 tablet in mouth 4 times daily before meals and at bedtime for nausea  . polyethylene glycol (MIRALAX / GLYCOLAX) packet Take 17 g by mouth daily.    . Polyvinyl Alcohol-Povidone PF 1.4-0.6 % SOLN Apply 1 drop to eye 3 (three) times daily.  [name] senna-docusate (SENOKOT-S) 8.6-50 MG tablet Take 2 tablets by mouth at bedtime.    No facility-administered encounter medications on file as of 07/25/2017.     Review of Systems   Unobtainable secondary to dementia please see HPI  Immunization History  Administered Date(s) Administered  . Influenza-Unspecified 02/13/2014, 02/09/2016, 02/10/2017  . Pneumococcal Conjugate-13 02/10/2017  . Pneumococcal Polysaccharide-23 02/29/2016  . Tdap 01/30/2017   Pertinent  Health Maintenance Due  Topic Date Due  . INFLUENZA VACCINE  Completed  . DEXA SCAN  Completed  . PNA vac Low Risk Adult  Completed   Fall Risk  01/26/2017  Falls in the past year? No   Functional  Status Survey:    Vitals:   07/25/17 1005  BP: 131/82  Pulse: 64  Resp: 18  Temp: 99.3 F (37.4 C)  TempSrc: Oral  SpO2: 97%    Physical Exam   In general this is a somewhat frail elderly female in no distress but is still somewhat lethargic but responsive.  Her skin is warm and dry she is not really acutely warm or diaphoretic today.  Hematoma on her left arm is currently covered again I do not see any surrounding concern for cellulitis.  Eyes patient again would not really open her eyes when she did she did make eye contact sclera and conjunctive appear to be clear.  Oropharynx also appeared to be clear mucous membranes did not appear to be overtly dry.  Chest she does not follow verbal commands clear could not appreciate any overt labored breathing or congestion.  Heart continues to be regular rate and rhythm without murmur gallop or  rub she has mild baseline lower extremity edema.  Abdomen is soft does not really appear to be tender there are positive bowel sounds there are active.  Musculoskeletal appears to move her extremities roughly at baseline although she does not really follow verbal commands which makes this difficult but do not see any acute changes.  Neurologic as noted above continues to be lethargic but responsive holds her upper arms in somewhat of a contracted position.  Psych again findings consistent with severe dementia Labs reviewed: Recent Labs    04/12/17 0710 05/04/17 0900 05/10/17 0800 07/24/17 1911  NA 148* 143  --  136  K 3.8 4.2  --  3.9  CL 113* 106  --  102  CO2 27 25  --  23  GLUCOSE 120* 92  --  147*  BUN 40* 61*  --  55*  CREATININE 2.10* 2.26*  --  1.98*  CALCIUM 9.9 9.8  --  8.8*  MG  --   --  2.7*  --    Recent Labs    04/10/17 1200 04/12/17 0710 07/24/17 1911  AST 60* 30 23  ALT 68* 50 13*  ALKPHOS 87 72 79  BILITOT 0.5 0.7 0.5  PROT 8.4* 7.7 7.0  ALBUMIN 4.4 4.0 3.6   Recent Labs    04/10/17 1200  04/12/17 0710 07/24/17 1911  WBC 12.4* 8.0 8.2  NEUTROABS 10.3* 6.0 7.2  HGB 14.0 14.0 10.0*  HCT 46.1* 46.2* 32.4*  MCV 94.9 95.3 92.6  PLT 244 215 129*   Lab Results  Component Value Date   TSH 0.823 04/10/2017   Lab Results  Component Value Date   HGBA1C  05/17/2007    5.4 (NOTE)   The ADA recommends the following therapeutic goals for glycemic   control related to Hgb A1C measurement:   Goal of Therapy:   < 7.0% Hgb A1C   Action Suggested:  > 8.0% Hgb A1C   Ref:  Diabetes Care, 22, Suppl. 1, 1999   Lab Results  Component Value Date   CHOL  05/22/2007    175        ATP III CLASSIFICATION:  <200     mg/dL   Desirable  200-239  mg/dL   Borderline High  >=240    mg/dL   High   HDL 37 (L) 05/22/2007   LDLCALC  05/22/2007    96        Total Cholesterol/HDL:CHD Risk Coronary Heart Disease Risk Table                     Men   Women  1/2 Average Risk   3.4   3.3   TRIG 208 (H) 05/22/2007   CHOLHDL 4.7 05/22/2007    [facility] Diagnostic Results in last 30 days:  No results found.  Assessment/Plan   #1-fever of unknown origin- as noted above labs appear to be fairly unremarkable with normal white count hemoglobin is down a bit have ordered guaiac stools x3.  X-ray has shown mild constipation have spoken with nursing and they feel at this point this is under good control she is on MiraLAX as well as Colace and nursing feels this is quite effective in addition to the senna that she gets at bedtime but this will need to be monitored.  In regards to treating the fever at this point most likely etiology is the urine since chest x-ray was fairly benign as well as abdominal x-ray- she was treated  successfully with Cefdinir recently for UTI E. coli-we will reinstitute this for 7 days as well as a probiotic and monitor this was discussed with Dr. Lyndel Safe as well as with her daughter who is in agreement   [id number]   Granville Lewis PA-C

## 2017-08-03 ENCOUNTER — Encounter (HOSPITAL_COMMUNITY)
Admission: AD | Admit: 2017-08-03 | Discharge: 2017-08-03 | Disposition: A | Discharge status: Home / Self Care | Payer: Medicare Other | Source: Skilled Nursing Facility | Attending: *Deleted | Admitting: *Deleted

## 2017-08-03 DIAGNOSIS — K3184 Gastroparesis: Secondary | ICD-10-CM | POA: Diagnosis not present

## 2017-08-03 LAB — OCCULT BLOOD X 1 CARD TO LAB, STOOL: FECAL OCCULT BLD: NEGATIVE

## 2017-08-07 ENCOUNTER — Other Ambulatory Visit (HOSPITAL_COMMUNITY)
Admission: RE | Admit: 2017-08-07 | Discharge: 2017-08-07 | Disposition: A | Discharge status: Home / Self Care | Payer: Medicare Other | Source: Skilled Nursing Facility | Attending: Internal Medicine | Admitting: Internal Medicine

## 2017-08-07 DIAGNOSIS — K3184 Gastroparesis: Secondary | ICD-10-CM | POA: Diagnosis not present

## 2017-08-07 DIAGNOSIS — K219 Gastro-esophageal reflux disease without esophagitis: Secondary | ICD-10-CM | POA: Insufficient documentation

## 2017-08-07 LAB — OCCULT BLOOD X 1 CARD TO LAB, STOOL
FECAL OCCULT BLD: NEGATIVE
Fecal Occult Bld: NEGATIVE

## 2017-08-08 ENCOUNTER — Encounter: Payer: Self-pay | Admitting: Internal Medicine

## 2017-08-08 NOTE — Progress Notes (Signed)
Location:   [facility] Room Number: 110/W Place of Service:  SNF [phone]) Provider:  Gardiner Fanti, Rene Kocher, MD  Patient Care Team: [name], MD as PCP - General (Internal Medicine) [name], [name], MD as Consulting Physician (Gastroenterology)  Extended Emergency Contact Information Primary Emergency Contact: [name] Address: [address]          [place], [place] [address] [name] of [place] Phone: [phone] Work Phone: [phone] Mobile Phone: [phone] Relation: Daughter Secondary Emergency Contact: [name] Address: [address]          [place], [place] [address] [name] of [place] Phone: [phone] Mobile Phone: [phone] Relation: Relative  Code Status:  DNR Goals of care: Advanced Directive information Advanced Directives 08/08/2017  Does Patient Have a Medical Advance Directive? Yes  Type of Advance Directive Out of facility DNR (pink MOST or yellow form)  Does patient want to make changes to medical advance directive? No - Patient declined  Copy of [place] in Chart? No - copy requested  Would patient like information on creating a medical advance directive? No - Patient declined  Pre-existing out of facility DNR order (yellow form or pink MOST form) -     Chief Complaint  Patient presents with  . Medical Management of Chronic Issues    Routine Visit    HPI:  Pt is a 82 y.o. female seen today for medical management of chronic diseases.     Past Medical History:  Diagnosis Date  . Cancer ([facility])   . Dementia   . Gallstones   . Gastroparalysis   . HTN (hypertension)   . Infiltrating ductal carcinoma of right female breast ([facility]) 12/02/2009   Qualifier: History of  By: [name] CMA [name]), June    . Osteoporosis 11/18/2011  . Reflux   . Renal disorder    renal insufficiency.    Past Surgical History:  Procedure Laterality Date  . BREAST BIOPSY    . BREAST  LUMPECTOMY    . COLONOSCOPY  2009   Dr. Fuller Plan: normal  . ESOPHAGOGASTRODUODENOSCOPY  12/11/2009   Dr. Fuller Plan: hiatal hernia  . ESOPHAGOGASTRODUODENOSCOPY N/A 01/29/2014   Procedure: ESOPHAGOGASTRODUODENOSCOPY (EGD);  Surgeon: [name], MD;  [facility];  Service: Endoscopy;  Laterality: N/A;  245  . TONSILLECTOMY AND ADENOIDECTOMY      Allergies  Allergen Reactions  . Aricept [Donepezil Hydrochloride] Nausea And Vomiting  . Exelon [Rivastigmine Tartrate]   . Lithium Nausea And Vomiting    Outpatient Encounter Medications as of 08/08/2017  Medication Sig  . acetaminophen (TYLENOL) 325 MG tablet Take one tablet by mouth at bedtime, take 650 mg by mouth every 4 hours prn  . amLODipine (NORVASC) 2.5 MG tablet Take 2.5 mg by mouth along with 5 mg to = 7.5 mg once a day  . amLODipine (NORVASC) 5 MG tablet Take 5 mg by mouth along with 2.5 mg to = 7.5 mg once a day  . docusate (COLACE) 50 MG/5ML liquid Take 100 mg by mouth daily.  . metoCLOPramide (REGLAN) 5 MG tablet Take 5 mg by mouth 2 (two) times daily.   . Olopatadine HCl 0.2 % SOLN Place 1 drop into both eyes daily as needed.  [name] omeprazole (PRILOSEC) 20 MG capsule Take 20 mg by mouth daily.  . ondansetron (ZOFRAN-ODT) 4 MG disintegrating tablet Dissolve 1 tablet in mouth 4 times daily before meals and at bedtime for nausea  . polyethylene  glycol (MIRALAX / GLYCOLAX) packet Take 17 g by mouth daily.    . Polyvinyl Alcohol-Povidone PF 1.4-0.6 % SOLN Apply 1 drop to eye 3 (three) times daily.  [name] senna-docusate (SENOKOT-S) 8.6-50 MG tablet Take 2 tablets by mouth at bedtime.    No facility-administered encounter medications on file as of 08/08/2017.       Review of Systems  Immunization History  Administered Date(s) Administered  . Influenza-Unspecified 02/13/2014, 02/09/2016, 02/10/2017  . Pneumococcal Conjugate-13 02/10/2017  . Pneumococcal Polysaccharide-23 02/29/2016  . Tdap 01/30/2017   Pertinent  Health  Maintenance Due  Topic Date Due  . INFLUENZA VACCINE  12/07/2017  . DEXA SCAN  Completed  . PNA vac Low Risk Adult  Completed   Fall Risk  01/26/2017  Falls in the past year? No   Functional Status Survey:    Vitals:   08/08/17 1226  BP: (!) 160/80  Pulse: (!) 54  Resp: 20  Temp: (!) 97.4 F (36.3 C)  TempSrc: Oral  SpO2: 94%  Weight: 146 lb 12.8 oz (66.6 kg)  Height: 5\' 7"  (1.702 m)   Body mass index is 22.99 kg/m. Physical Exam  Labs reviewed: Recent Labs    04/12/17 0710 05/04/17 0900 05/10/17 0800 07/24/17 1911  NA 148* 143  --  136  K 3.8 4.2  --  3.9  CL 113* 106  --  102  CO2 27 25  --  23  GLUCOSE 120* 92  --  147*  BUN 40* 61*  --  55*  CREATININE 2.10* 2.26*  --  1.98*  CALCIUM 9.9 9.8  --  8.8*  MG  --   --  2.7*  --    Recent Labs    04/10/17 1200 04/12/17 0710 07/24/17 1911  AST 60* 30 23  ALT 68* 50 13*  ALKPHOS 87 72 79  BILITOT 0.5 0.7 0.5  PROT 8.4* 7.7 7.0  ALBUMIN 4.4 4.0 3.6   Recent Labs    04/10/17 1200 04/12/17 0710 07/24/17 1911  WBC 12.4* 8.0 8.2  NEUTROABS 10.3* 6.0 7.2  HGB 14.0 14.0 10.0*  HCT 46.1* 46.2* 32.4*  MCV 94.9 95.3 92.6  PLT 244 215 129*   Lab Results  Component Value Date   TSH 0.823 04/10/2017   Lab Results  Component Value Date   HGBA1C  05/17/2007    5.4 (NOTE)   The ADA recommends the following therapeutic goals for glycemic   control related to Hgb A1C measurement:   Goal of Therapy:   < 7.0% Hgb A1C   Action Suggested:  > 8.0% Hgb A1C   Ref:  Diabetes Care, 22, Suppl. 1, 1999   Lab Results  Component Value Date   CHOL  05/22/2007    175        ATP III CLASSIFICATION:  <200     mg/dL   Desirable  200-239  mg/dL   Borderline High  >=240    mg/dL   High   HDL 37 (L) 05/22/2007   LDLCALC  05/22/2007    96        Total Cholesterol/HDL:CHD Risk Coronary Heart Disease Risk Table                     Men   Women  1/2 Average Risk   3.4   3.3   TRIG 208 (H) 05/22/2007   CHOLHDL 4.7  05/22/2007    Significant Diagnostic Results in last 30 days:  No results  found.  Assessment/Plan There are no diagnoses linked to this encounter.   Family/ staff Communication:   Labs/tests ordered:      This encounter was created in error - please disregard.

## 2017-08-09 ENCOUNTER — Non-Acute Institutional Stay (SKILLED_NURSING_FACILITY): Payer: Medicare Other | Admitting: Internal Medicine

## 2017-08-09 ENCOUNTER — Encounter: Payer: Self-pay | Admitting: Internal Medicine

## 2017-08-09 DIAGNOSIS — D649 Anemia, unspecified: Secondary | ICD-10-CM | POA: Diagnosis not present

## 2017-08-09 DIAGNOSIS — I10 Essential (primary) hypertension: Secondary | ICD-10-CM | POA: Diagnosis not present

## 2017-08-09 DIAGNOSIS — F0281 Dementia in other diseases classified elsewhere with behavioral disturbance: Secondary | ICD-10-CM | POA: Diagnosis not present

## 2017-08-09 DIAGNOSIS — N289 Disorder of kidney and ureter, unspecified: Secondary | ICD-10-CM

## 2017-08-09 DIAGNOSIS — G300 Alzheimer's disease with early onset: Secondary | ICD-10-CM

## 2017-08-09 DIAGNOSIS — K3184 Gastroparesis: Secondary | ICD-10-CM

## 2017-08-09 NOTE — Progress Notes (Signed)
Location:   [facility] Room Number: 110/W Place of Service:  SNF (31) Provider:  Freddi Starr, MD  Patient Care Team: [name], MD as PCP - General (Internal Medicine) [name], [name], MD as Consulting Physician (Gastroenterology)  Extended Emergency Contact Information Primary Emergency Contact: [name] Address: [address]          [place], [place] [address] [name] of [place] Phone: [phone] Work Phone: [phone] Mobile Phone: [phone] Relation: Daughter Secondary Emergency Contact: [name] Address: [address]          [place], [place] [address] [name] of [place] Phone: [phone] Mobile Phone: [phone] Relation: Relative  Code Status:  DNR Goals of care: Advanced Directive information Advanced Directives 08/09/2017  Does Patient Have a Medical Advance Directive? Yes  Type of Advance Directive Out of facility DNR (pink MOST or yellow form)  Does patient want to make changes to medical advance directive? No - Patient declined  Copy of [place] in Chart? No - copy requested  Would patient like information on creating a medical advance directive? No - Patient declined  Pre-existing out of facility DNR order (yellow form or pink MOST form) -     Chief Complaint  Patient presents with  . Medical Management of Chronic Issues    Routine Visit  For medical management of chronic medical issues including severe dementia-chronic renal disease-hypertension-gastroparesis- history of breast cancer with previous mastectomy.    HPI:  Pt is a 82 y.o. female seen today for medical management of chronic diseases.  As noted above-she appears to be enjoying a relative period of stability now-most recent acute issue was a fever of unknown origin last month-  At that time she was found to be increasingly lethargic we ordered a chest x-ray and abdominal x-ray as well as labs all  were generally baseline and unremarkable the abdominal x-ray did show mild constipation which has been addressed.  It was thought most likely the fever was caused by a urinary tract infection and she was treated successfully with Cefdinirr and the fever and lethargy resolved-her daughter wishes conservative interventions and thus a urine culture was not   Today she is sitting in her wheelchair comfortably and at baseline.  She does have advanced dementia and again her daughter wishes conservative care here with minimal interventions.  For hypertension she is on low-dose Norvasc I got a manual reading of 138/82 today machine reading was somewhat higher at 160/80 there is some variation here but we have been rather conservative trying to avoid hypotension.  She also has a history of chronic renal insufficiency creatinine of 1.98 on lab done last month actually showed some slight improvement again her daughter wishes minimal labs.  Incidentally the labs obtained during the fever episode did show that her hemoglobin was down a bit from baseline at 10 usually it runs around 12-14-we did test t rectal bleeding and this was negative---  She also has had a history of gastroparesis in the past which was significant but this appears to have stabilized on a proton pump inhibitor she is also on Reglan twice a day and Zofran 4 times a day.  Her weight appears to be relatively stable at 146.8 pounds   Past Medical History:  Diagnosis Date  . Cancer ([facility])   . Dementia   . Gallstones   . Gastroparalysis   . HTN (hypertension)   . Infiltrating ductal carcinoma of  right female breast ([facility]) 12/02/2009   Qualifier: History of  By: [name] CMA [name]), June    . Osteoporosis 11/18/2011  . Reflux   . Renal disorder    renal insufficiency.    Past Surgical History:  Procedure Laterality Date  . BREAST BIOPSY    . BREAST LUMPECTOMY    . COLONOSCOPY  2009   Dr. Fuller Plan: normal  . ESOPHAGOGASTRODUODENOSCOPY   12/11/2009   Dr. Fuller Plan: hiatal hernia  . ESOPHAGOGASTRODUODENOSCOPY N/A 01/29/2014   Procedure: ESOPHAGOGASTRODUODENOSCOPY (EGD);  Surgeon: [name], MD;  [facility];  Service: Endoscopy;  Laterality: N/A;  245  . TONSILLECTOMY AND ADENOIDECTOMY      Allergies  Allergen Reactions  . Aricept [Donepezil Hydrochloride] Nausea And Vomiting  . Exelon [Rivastigmine Tartrate]   . Lithium Nausea And Vomiting    Outpatient Encounter Medications as of 08/09/2017  Medication Sig  . acetaminophen (TYLENOL) 325 MG tablet Take one tablet by mouth at bedtime, take 650 mg by mouth every 4 hours prn  . amLODipine (NORVASC) 2.5 MG tablet Take 2.5 mg by mouth along with 5 mg to = 7.5 mg once a day  . amLODipine (NORVASC) 5 MG tablet Take 5 mg by mouth along with 2.5 mg to = 7.5 mg once a day  . docusate (COLACE) 50 MG/5ML liquid Take 100 mg by mouth daily.  . metoCLOPramide (REGLAN) 5 MG tablet Take 5 mg by mouth 2 (two) times daily.   . Olopatadine HCl 0.2 % SOLN Place 1 drop into both eyes daily as needed.  [name] omeprazole (PRILOSEC) 20 MG capsule Take 20 mg by mouth daily.  . ondansetron (ZOFRAN-ODT) 4 MG disintegrating tablet Dissolve 1 tablet in mouth 4 times daily before meals and at bedtime for nausea  . polyethylene glycol (MIRALAX / GLYCOLAX) packet Take 17 g by mouth daily.    . Polyvinyl Alcohol-Povidone PF 1.4-0.6 % SOLN Apply 1 drop to eye 3 (three) times daily.  [name] senna-docusate (SENOKOT-S) 8.6-50 MG tablet Take 2 tablets by mouth at bedtime.    No facility-administered encounter medications on file as of 08/09/2017.      Review of Systems   Is unobtainable secondary to severe dementia  Immunization History  Administered Date(s) Administered  . Influenza-Unspecified 02/13/2014, 02/09/2016, 02/10/2017  . Pneumococcal Conjugate-13 02/10/2017  . Pneumococcal Polysaccharide-23 02/29/2016  . Tdap 01/30/2017   Pertinent  Health Maintenance Due  Topic Date Due  .  INFLUENZA VACCINE  12/07/2017  . DEXA SCAN  Completed  . PNA vac Low Risk Adult  Completed   Fall Risk  01/26/2017  Falls in the past year? No   Functional Status Survey:    Vitals:   08/09/17 1247  BP: (!) 160/80  Pulse: (!) 54  Resp: 20  Temp: (!) 97.4 F (36.3 C)  TempSrc: Oral  SpO2: 94%  Weight: 146 lb 12.8 oz (66.6 kg)  Height: 5\' 7"  (1.702 m)  --Of note manual blood pressure was 138/82--heart rate was 60 on auscultation Body mass index is 22.99 kg/m. Physical Exam   In general this is a frail-appearing elderly female she is alert but show signs of severe dementia there is no sign of distress-she is sitting comfortably in her wheelchair at baseline.  Her skin is warm and dry.  Eyes visual acuity appears grossly intact sclera and conjunctive are clear she does not really make eye contact.  Oropharynx appear to be clear mucous membranes moist.  Chest I could not really appreciate  any overt congestion or labored breathing she does not follow verbal commands to take a deep breath.  Heart is regular with some irregular beats she has some mild dependent edema  Abdomen is soft nontender with positive bowel sounds.  Musculoskeletal again she does not follow verbal commands but appears able to move her extremities at baseline I did not really note any deformities she has mild dependent edema lower extremities.  Neurologic as noted above she is alert responsive at her baseline.  Psych findings again consistent with severe dementia as noted above    Labs reviewed: Recent Labs    04/12/17 0710 05/04/17 0900 05/10/17 0800 07/24/17 1911  NA 148* 143  --  136  K 3.8 4.2  --  3.9  CL 113* 106  --  102  CO2 27 25  --  23  GLUCOSE 120* 92  --  147*  BUN 40* 61*  --  55*  CREATININE 2.10* 2.26*  --  1.98*  CALCIUM 9.9 9.8  --  8.8*  MG  --   --  2.7*  --    Recent Labs    04/10/17 1200 04/12/17 0710 07/24/17 1911  AST 60* 30 23  ALT 68* 50 13*  ALKPHOS 87 72  79  BILITOT 0.5 0.7 0.5  PROT 8.4* 7.7 7.0  ALBUMIN 4.4 4.0 3.6   Recent Labs    04/10/17 1200 04/12/17 0710 07/24/17 1911  WBC 12.4* 8.0 8.2  NEUTROABS 10.3* 6.0 7.2  HGB 14.0 14.0 10.0*  HCT 46.1* 46.2* 32.4*  MCV 94.9 95.3 92.6  PLT 244 215 129*   Lab Results  Component Value Date   TSH 0.823 04/10/2017   Lab Results  Component Value Date   HGBA1C  05/17/2007    5.4 (NOTE)   The ADA recommends the following therapeutic goals for glycemic   control related to Hgb A1C measurement:   Goal of Therapy:   < 7.0% Hgb A1C   Action Suggested:  > 8.0% Hgb A1C   Ref:  Diabetes Care, 22, Suppl. 1, 1999   Lab Results  Component Value Date   CHOL  05/22/2007    175        ATP III CLASSIFICATION:  <200     mg/dL   Desirable  200-239  mg/dL   Borderline High  >=240    mg/dL   High   HDL 37 (L) 05/22/2007   LDLCALC  05/22/2007    96        Total Cholesterol/HDL:CHD Risk Coronary Heart Disease Risk Table                     Men   Women  1/2 Average Risk   3.4   3.3   TRIG 208 (H) 05/22/2007   CHOLHDL 4.7 05/22/2007    Significant Diagnostic Results in last 30 days:  No results found.  Assessment/Plan  #1 severe dementia-again continue comfort measures she appears to be doing well with supportive care at times will have acute issues like the fever but appears to respond back to baseline in a fairly quick manner.  Her weight appears to be relatively stable-her daughter actually is the dietitian in the facility.  2.-  History of anemia-her hemoglobin has dropped some this appears to be normocytic this has been discussed with her daughter and she is agreeable to updating labs including rechecking her hemoglobin and obtaining iron studies-will await those results-testing for rectal bleeding has been negative-clinically  patient appears to be stable and relatively asymptomatic.  3.  History of gastroparesis this is responded well to the routine Zofran and Reglan as well as a proton  pump inhibitor her weight has stabilized apparently there are much fewer vomiting episodes-however she still struggles apparently to eat regular food and largely drinks and has supplements.  4.  Hypertension she has some variable blood pressures on current dose of Norvasc however hesitant to increase her Norvasc dose beyond 7.5 mg for concerns of hypotension in the past.  5.  History of constipation she is on numerous agents including MiraLAX and senna and this appears to be stable per nursing at this point.  6.  History of breast cancer status post mastectomy she is no longer on medication secondary to family wishes for conservative management and minimal medications.  7.  History of renal insufficiency this appears relatively baseline to slightly improve creatinine 1.98 will obtain a BMP since we are obtaining the other blood work as well  [id number]  :

## 2017-08-10 ENCOUNTER — Encounter (HOSPITAL_COMMUNITY)
Admission: RE | Admit: 2017-08-10 | Discharge: 2017-08-10 | Disposition: A | Discharge status: Home / Self Care | Payer: Medicare Other | Source: Skilled Nursing Facility | Attending: Internal Medicine | Admitting: Internal Medicine

## 2017-08-10 DIAGNOSIS — F319 Bipolar disorder, unspecified: Secondary | ICD-10-CM | POA: Insufficient documentation

## 2017-08-10 DIAGNOSIS — M6281 Muscle weakness (generalized): Secondary | ICD-10-CM | POA: Insufficient documentation

## 2017-08-10 DIAGNOSIS — R509 Fever, unspecified: Secondary | ICD-10-CM | POA: Diagnosis not present

## 2017-08-10 DIAGNOSIS — K219 Gastro-esophageal reflux disease without esophagitis: Secondary | ICD-10-CM | POA: Diagnosis not present

## 2017-08-10 DIAGNOSIS — R293 Abnormal posture: Secondary | ICD-10-CM | POA: Insufficient documentation

## 2017-08-10 DIAGNOSIS — Z9181 History of falling: Secondary | ICD-10-CM | POA: Insufficient documentation

## 2017-08-10 DIAGNOSIS — R1311 Dysphagia, oral phase: Secondary | ICD-10-CM | POA: Diagnosis not present

## 2017-08-10 LAB — BASIC METABOLIC PANEL
Anion gap: 12 (ref 5–15)
BUN: 43 mg/dL — ABNORMAL HIGH (ref 6–20)
CALCIUM: 9.5 mg/dL (ref 8.9–10.3)
CO2: 26 mmol/L (ref 22–32)
Chloride: 105 mmol/L (ref 101–111)
Creatinine, Ser: 1.82 mg/dL — ABNORMAL HIGH (ref 0.44–1.00)
GFR calc Af Amer: 29 mL/min — ABNORMAL LOW (ref >60)
GFR calc non Af Amer: 25 mL/min — ABNORMAL LOW (ref >60)
GLUCOSE: 87 mg/dL (ref 65–99)
Potassium: 4 mmol/L (ref 3.5–5.1)
Sodium: 143 mmol/L (ref 135–145)

## 2017-08-10 LAB — VITAMIN B12: Vitamin B-12: 1070 pg/mL — ABNORMAL HIGH (ref 180–914)

## 2017-08-10 LAB — CBC WITH DIFFERENTIAL/PLATELET
Basophils Absolute: 0 10*3/uL (ref 0.0–0.1)
Basophils Relative: 0 %
Eosinophils Absolute: 0.2 10*3/uL (ref 0.0–0.7)
Eosinophils Relative: 2 %
HEMATOCRIT: 38.8 % (ref 36.0–46.0)
Hemoglobin: 12.1 g/dL (ref 12.0–15.0)
Lymphocytes Relative: 21 %
Lymphs Abs: 1.4 10*3/uL (ref 0.7–4.0)
MCH: 28.2 pg (ref 26.0–34.0)
MCHC: 31.2 g/dL (ref 30.0–36.0)
MCV: 90.4 fL (ref 78.0–100.0)
MONO ABS: 0.5 10*3/uL (ref 0.1–1.0)
Monocytes Relative: 8 %
Neutro Abs: 4.5 10*3/uL (ref 1.7–7.7)
Neutrophils Relative %: 69 %
Platelets: 250 10*3/uL (ref 150–400)
RBC: 4.29 MIL/uL (ref 3.87–5.11)
RDW: 13.5 % (ref 11.5–15.5)
WBC: 6.5 10*3/uL (ref 4.0–10.5)

## 2017-08-10 LAB — FERRITIN: Ferritin: 37 ng/mL (ref 11–307)

## 2017-08-10 LAB — IRON AND TIBC
Iron: 47 ug/dL (ref 28–170)
SATURATION RATIOS: 15 % (ref 10.4–31.8)
TIBC: 308 ug/dL (ref 250–450)
UIBC: 261 ug/dL

## 2017-08-10 LAB — RETICULOCYTES
RBC.: 4.29 MIL/uL (ref 3.87–5.11)
RETIC COUNT ABSOLUTE: 47.2 10*3/uL (ref 19.0–186.0)
RETIC CT PCT: 1.1 % (ref 0.4–3.1)

## 2017-08-11 LAB — FOLATE RBC
FOLATE, HEMOLYSATE: 617.7 ng/mL
FOLATE, RBC: 1660 ng/mL (ref >498)
HEMATOCRIT: 37.2 % (ref 34.0–46.6)

## 2017-09-14 ENCOUNTER — Encounter: Payer: Self-pay | Admitting: Internal Medicine

## 2017-09-14 ENCOUNTER — Non-Acute Institutional Stay (SKILLED_NURSING_FACILITY): Payer: Medicare Other | Admitting: Internal Medicine

## 2017-09-14 DIAGNOSIS — N289 Disorder of kidney and ureter, unspecified: Secondary | ICD-10-CM

## 2017-09-14 DIAGNOSIS — G300 Alzheimer's disease with early onset: Secondary | ICD-10-CM

## 2017-09-14 DIAGNOSIS — K3184 Gastroparesis: Secondary | ICD-10-CM

## 2017-09-14 DIAGNOSIS — F0281 Dementia in other diseases classified elsewhere with behavioral disturbance: Secondary | ICD-10-CM | POA: Diagnosis not present

## 2017-09-14 DIAGNOSIS — I10 Essential (primary) hypertension: Secondary | ICD-10-CM

## 2017-09-14 DIAGNOSIS — F02818 Dementia in other diseases classified elsewhere, unspecified severity, with other behavioral disturbance: Secondary | ICD-10-CM

## 2017-09-14 DIAGNOSIS — C50911 Malignant neoplasm of unspecified site of right female breast: Secondary | ICD-10-CM | POA: Diagnosis not present

## 2017-09-14 NOTE — Progress Notes (Signed)
Location:   [facility] Room Number: 110/W Place of Service:  SNF (31) Provider:  Freddi Starr, MD  Patient Care Team: [name], MD as PCP - General (Internal Medicine) [name], [name], MD as Consulting Physician (Gastroenterology)  Extended Emergency Contact Information Primary Emergency Contact: [name] Address: [address]          [place], [place] [address] [name] of [place] Phone: [phone] Work Phone: [phone] Mobile Phone: [phone] Relation: Daughter Secondary Emergency Contact: [name] Address: [address]          [place],  [address] [name] of [place] Phone: [phone] Mobile Phone: [phone] Relation: Relative  Code Status:  DNR Goals of care: Advanced Directive information Advanced Directives 09/14/2017  Does Patient Have a Medical Advance Directive? Yes  Type of Advance Directive Out of facility DNR (pink MOST or yellow form)  Does patient want to make changes to medical advance directive? No - Patient declined  Copy of [place] in Chart? No - copy requested  Would patient like information on creating a medical advance directive? No - Patient declined  Pre-existing out of facility DNR order (yellow form or pink MOST form) -     Chief Complaint  Patient presents with  . Medical Management of Chronic Issues    Routine Visit  For medical management  of severe dementia-chronic renal disease-hypertension-gastroparesis-history of breast cancer with previous meniscectomy and history of UTIs  HPI:  Pt is a 82 y.o. female seen today for medical management of chronic diseases.  As noted above  She appears to be having a spirit of stability she is actually gained a few pounds.  Most recent acute issue was a fever of unknown origin back in March- was thought most likely the fever is caused by UTI with her history she was treated empirically on a  cephalosporin and actually did well with this and now appears to be back on her baseline.  Past she also had a significant history of gastroparesis with vomiting and nausea that appears to have stabilized on current medications including Reglan twice a day Zofran 4 times a day and a proton pump inhibitor-- again she has gained appears a small amount of weight her daughter actually is dietitian in the facility and has been encouraging p.o. intake as well apparently she drinks better than she eats   She also has a history of chronic renal disease we have been minimizing labs and medications secondary to family wishes last creatinine of 1.83 on April lab appear to be relatively baseline.  She also has a distant history of breast cancer with mastectomy at one point had been on medication but per family wishes for medication simplification this is been discontinued.  Currently she is sitting in her wheelchair comfortably does not really follow any verbal commands anymore but appears to be stable   Past Medical History:  Diagnosis Date  . Cancer ([place])   . Dementia   . Gallstones   . Gastroparalysis   . HTN (hypertension)   . Infiltrating ductal carcinoma of right female breast ([place]) 12/02/2009   Qualifier: History of  By: [name] CMA [name]), June    . Osteoporosis 11/18/2011  . Reflux   . Renal disorder    renal insufficiency.    Past Surgical History:  Procedure Laterality Date  . BREAST BIOPSY    . BREAST LUMPECTOMY    . COLONOSCOPY  2009  Dr. Fuller Plan: normal  . ESOPHAGOGASTRODUODENOSCOPY  12/11/2009   Dr. Fuller Plan: hiatal hernia  . ESOPHAGOGASTRODUODENOSCOPY N/A 01/29/2014   Procedure: ESOPHAGOGASTRODUODENOSCOPY (EGD);  Surgeon: [name], MD;  [facility];  Service: Endoscopy;  Laterality: N/A;  245  . TONSILLECTOMY AND ADENOIDECTOMY      Allergies  Allergen Reactions  . Aricept [Donepezil Hydrochloride] Nausea And Vomiting  . Exelon [Rivastigmine Tartrate]   .  Lithium Nausea And Vomiting    Outpatient Encounter Medications as of 09/14/2017  Medication Sig  . acetaminophen (TYLENOL) 325 MG tablet Take one tablet by mouth at bedtime, take 650 mg by mouth every 4 hours prn  . amLODipine (NORVASC) 2.5 MG tablet Take 2.5 mg by mouth along with 5 mg to = 7.5 mg once a day  . amLODipine (NORVASC) 5 MG tablet Take 5 mg by mouth along with 2.5 mg to = 7.5 mg once a day  . docusate (COLACE) 50 MG/5ML liquid Take 100 mg by mouth daily.  . metoCLOPramide (REGLAN) 5 MG tablet Take 5 mg by mouth 2 (two) times daily.   . Multiple Vitamins-Minerals (MULTIVITAMINS/MINERALS ADULT) LIQD Take 5 mLs by mouth daily.  . Olopatadine HCl 0.2 % SOLN Place 1 drop into both eyes daily as needed.  [name] omeprazole (PRILOSEC) 20 MG capsule Take 20 mg by mouth daily.  . ondansetron (ZOFRAN-ODT) 4 MG disintegrating tablet Dissolve 1 tablet in mouth 4 times daily before meals and at bedtime for nausea  . polyethylene glycol (MIRALAX / GLYCOLAX) packet Take 17 g by mouth daily.    [name] senna-docusate (SENOKOT-S) 8.6-50 MG tablet Take 2 tablets by mouth at bedtime.   . [DISCONTINUED] Polyvinyl Alcohol-Povidone PF 1.4-0.6 % SOLN Apply 1 drop to eye 3 (three) times daily.   No facility-administered encounter medications on file as of 09/14/2017.      Review of Systems   Is unobtainable secondary to dementia  Immunization History  Administered Date(s) Administered  . Influenza-Unspecified 02/13/2014, 02/09/2016, 02/10/2017  . Pneumococcal Conjugate-13 02/10/2017  . Pneumococcal Polysaccharide-23 02/29/2016  . Tdap 01/30/2017   Pertinent  Health Maintenance Due  Topic Date Due  . INFLUENZA VACCINE  12/07/2017  . DEXA SCAN  Completed  . PNA vac Low Risk Adult  Completed   Fall Risk  01/26/2017  Falls in the past year? No   Functional Status Survey:    Vitals:   09/14/17 1507  BP: 119/81  Pulse: (!) 58  Resp: 20  Temp: (!) 97 F (36.1 C)  TempSrc: Oral  SpO2: 96%    Weight: 151 lb (68.5 kg)  Height: 5\' 7"  (1.702 m)   Body mass index is 23.65 kg/m. Physical Exam -- in general this is a  somewhat frail elderly female in no distress sitting comfortably in her wheelchair.  Her skin is warm and dry.  Eyes visual acuity appears grossly intact sclera conjunctive are clear meantime she will make eye contact.  Oropharynx is clear mucous membranes moist.  Chest is clear to auscultation with poor respiratory effort she does not follow verbal commands.  Heart is regular rate and rhythm with occasional irregular beats she has trace lower extremity edema.  Abdomen is soft nontender with positive bowel sounds.  Musculoskeletal does not really follow verbal commands so makes exam somewhat difficult appears able to move her extremities at baseline at times she will hold her upper extremities in somewhat of a contracted position  Neurologic appears grossly intact she is alert and responsive.  Psych  again she does have significant dementia does not follow verbal commands but is alert and responsive Labs reviewed: Recent Labs    05/04/17 0900 05/10/17 0800 07/24/17 1911 08/10/17 0702  NA 143  --  136 143  K 4.2  --  3.9 4.0  CL 106  --  102 105  CO2 25  --  23 26  GLUCOSE 92  --  147* 87  BUN 61*  --  55* 43*  CREATININE 2.26*  --  1.98* 1.82*  CALCIUM 9.8  --  8.8* 9.5  MG  --  2.7*  --   --    Recent Labs    04/10/17 1200 04/12/17 0710 07/24/17 1911  AST 60* 30 23  ALT 68* 50 13*  ALKPHOS 87 72 79  BILITOT 0.5 0.7 0.5  PROT 8.4* 7.7 7.0  ALBUMIN 4.4 4.0 3.6   Recent Labs    04/12/17 0710 07/24/17 1911 08/10/17 0702  WBC 8.0 8.2 6.5  NEUTROABS 6.0 7.2 4.5  HGB 14.0 10.0* 12.1  HCT 46.2* 32.4* 38.8  37.2  MCV 95.3 92.6 90.4  PLT 215 129* 250   Lab Results  Component Value Date   TSH 0.823 04/10/2017   Lab Results  Component Value Date   HGBA1C  05/17/2007    5.4 (NOTE)   The ADA recommends the following therapeutic goals  for glycemic   control related to Hgb A1C measurement:   Goal of Therapy:   < 7.0% Hgb A1C   Action Suggested:  > 8.0% Hgb A1C   Ref:  Diabetes Care, 22, Suppl. 1, 1999   Lab Results  Component Value Date   CHOL  05/22/2007    175        ATP III CLASSIFICATION:  <200     mg/dL   Desirable  200-239  mg/dL   Borderline High  >=240    mg/dL   High   HDL 37 (L) 05/22/2007   LDLCALC  05/22/2007    96        Total Cholesterol/HDL:CHD Risk Coronary Heart Disease Risk Table                     Men   Women  1/2 Average Risk   3.4   3.3   TRIG 208 (H) 05/22/2007   CHOLHDL 4.7 05/22/2007    Significant Diagnostic Results in last 30 days:  No results found.  Assessment/Plan --- #1--history of dementia at this point emphasis on comfort care measures with minimal labs and medications.  She actually has gained a small amount of weight p.o. intake has been encouraged strongly.  At this point monitor and continue supportive care.  2.  History of gastroparesis this appears stable on routine Zofran and Reglan as well as a proton pump inhibitor again she has gained a small amount of weight appears her vomiting episodes have been significantly reduced.  3.  Hypertension this appears to be stable she is on Norvasc 7.5 mg a day did not tolerate the 10 mg dose secondary to hypotensive readings.  4.  History constipation she is on various agents including senna and MiraLAX apparently this is not an issue currently  #5 history of breast cancer she is status post mastectomy again not on medication secondary family wishes for conservative management.  6.  History of renal insufficiency this appears relatively baseline with creatinine around 1.8 on most recent lab again we are trying to minimize medications secondary to comfort  care measures.  7.  History of anemia hemoglobin at one point dropped a bit down to 10 her rectal testing was negative for any bleeding-hemoglobin appears to have gone up to 12.1  which is closer to her baseline B12 and folate were within normal limits iron level was 47.   #8 history of UTI again she did respond well to a cephalosporin cultures were not obtained secondary to concerns for patient comfort and family wishes for minimal interventions nonetheless empirically this appears to have helped.  [id number]

## 2017-10-17 ENCOUNTER — Non-Acute Institutional Stay (SKILLED_NURSING_FACILITY): Payer: Medicare Other | Admitting: Internal Medicine

## 2017-10-17 ENCOUNTER — Encounter: Payer: Self-pay | Admitting: Internal Medicine

## 2017-10-17 DIAGNOSIS — N289 Disorder of kidney and ureter, unspecified: Secondary | ICD-10-CM

## 2017-10-17 DIAGNOSIS — I10 Essential (primary) hypertension: Secondary | ICD-10-CM

## 2017-10-17 DIAGNOSIS — K219 Gastro-esophageal reflux disease without esophagitis: Secondary | ICD-10-CM

## 2017-10-17 DIAGNOSIS — K3184 Gastroparesis: Secondary | ICD-10-CM

## 2017-10-17 DIAGNOSIS — G300 Alzheimer's disease with early onset: Secondary | ICD-10-CM | POA: Diagnosis not present

## 2017-10-17 DIAGNOSIS — F0281 Dementia in other diseases classified elsewhere with behavioral disturbance: Secondary | ICD-10-CM

## 2017-10-17 DIAGNOSIS — R1314 Dysphagia, pharyngoesophageal phase: Secondary | ICD-10-CM | POA: Diagnosis not present

## 2017-10-17 NOTE — Progress Notes (Signed)
Location:   [facility] Room Number: 110/W Place of Service:  SNF (31) Provider:  Clydene Fake, MD  Patient Care Team: [name], MD as PCP - General (Internal Medicine) [name] [name], MD as Consulting Physician (Gastroenterology)  Extended Emergency Contact Information Primary Emergency Contact: [facility] Address: [address]          [place], [place] [address] [name] of [place] Phone: [phone] Work Phone: [phone] Mobile Phone: [phone] Relation: Daughter Secondary Emergency Contact: [name] Address: [address]          [place], [place] [address] [name] of [place] Phone: [phone] Mobile Phone: [phone] Relation: Relative  Code Status:  DNR Goals of care: Advanced Directive information Advanced Directives 10/17/2017  Does Patient Have a Medical Advance Directive? Yes  Type of Advance Directive Out of facility DNR (pink MOST or yellow form)  Does patient want to make changes to medical advance directive? No - Patient declined  Copy of [place] in Chart? No - copy requested  Would patient like information on creating a medical advance directive? No - Patient declined  Pre-existing out of facility DNR order (yellow form or pink MOST form) -     Chief Complaint  Patient presents with  . Medical Management of Chronic Issues    Patient being seen for Routine Visit    HPI:  Pt is a 82 y.o. female seen today for medical management of chronic diseases.  Patient has h/o hypertension, Advanced Dementia, Chronic renal disease, Breast cancer s/p Mastectomy, GERD and Gastroparesis. Patient is long term Resident of facility. Recently per her daughter patient has been more sleepy especially during the daytime.  The daughter is concerned is due to Zofran and Reglan. Since patient has been on around-the-clock Zofran and Reglan she has been stable with no more episodes of  vomiting.  Her weight is stable and actually up by 2 pounds to 151.  She though continues to be on liquid diet. She has not had any acute fever chills cough or dysuria Patient continues to be comfort care Patient unable to give any history due to her dementia.   Past Medical History:  Diagnosis Date  . Cancer ([place] [facility])   . Dementia   . Gallstones   . Gastroparalysis   . HTN (hypertension)   . Infiltrating ductal carcinoma of right female breast ([facility]) 12/02/2009   Qualifier: History of  By: [name] CMA [name]), June    . Osteoporosis 11/18/2011  . Reflux   . Renal disorder    renal insufficiency.    Past Surgical History:  Procedure Laterality Date  . BREAST BIOPSY    . BREAST LUMPECTOMY    . COLONOSCOPY  2009   Dr. Fuller Plan: normal  . ESOPHAGOGASTRODUODENOSCOPY  12/11/2009   Dr. Fuller Plan: hiatal hernia  . ESOPHAGOGASTRODUODENOSCOPY N/A 01/29/2014   Procedure: ESOPHAGOGASTRODUODENOSCOPY (EGD);  Surgeon: [name], MD;  [facility];  Service: Endoscopy;  Laterality: N/A;  245  . TONSILLECTOMY AND ADENOIDECTOMY      Allergies  Allergen Reactions  . Aricept [Donepezil Hydrochloride] Nausea And Vomiting  . Exelon [Rivastigmine Tartrate]   . Lithium Nausea And Vomiting    Outpatient Encounter Medications as of 10/17/2017  Medication Sig  . acetaminophen (TYLENOL) 325 MG tablet Take one tablet by mouth at bedtime, take 650 mg by mouth every 4 hours prn  . amLODipine (NORVASC) 2.5 MG tablet Take 2.5 mg by mouth  along with 5 mg to = 7.5 mg once a day  . amLODipine (NORVASC) 5 MG tablet Take 5 mg by mouth along with 2.5 mg to = 7.5 mg once a day  . docusate (COLACE) 50 MG/5ML liquid Take 100 mg by mouth daily.  . metoCLOPramide (REGLAN) 5 MG tablet Take 5 mg by mouth 2 (two) times daily.   . Multiple Vitamins-Minerals (MULTIVITAMINS/MINERALS ADULT) LIQD Take 5 mLs by mouth daily.  . Olopatadine HCl 0.2 % SOLN Place 1 drop into both eyes daily as needed.  [name] omeprazole  (PRILOSEC) 20 MG capsule Take 20 mg by mouth daily.  . ondansetron (ZOFRAN-ODT) 4 MG disintegrating tablet Dissolve 1 tablet in mouth 4 times daily before meals and at bedtime for nausea  . polyethylene glycol (MIRALAX / GLYCOLAX) packet Take 17 g by mouth daily.    [name] senna-docusate (SENOKOT-S) 8.6-50 MG tablet Take 2 tablets by mouth at bedtime.    No facility-administered encounter medications on file as of 10/17/2017.      Review of Systems  Unable to perform ROS: Dementia    Immunization History  Administered Date(s) Administered  . Influenza-Unspecified 02/13/2014, 02/09/2016, 02/10/2017  . Pneumococcal Conjugate-13 02/10/2017  . Pneumococcal Polysaccharide-23 02/29/2016  . Tdap 01/30/2017   Pertinent  Health Maintenance Due  Topic Date Due  . INFLUENZA VACCINE  12/07/2017  . DEXA SCAN  Completed  . PNA vac Low Risk Adult  Completed   Fall Risk  01/26/2017  Falls in the past year? No   Functional Status Survey:    Vitals:   10/17/17 0923  BP: (!) 134/59  Pulse: 66  Resp: 20  Temp: 98.6 F (37 C)  TempSrc: Oral  SpO2: 96%  Weight: 151 lb 3.2 oz (68.6 kg)  Height: 5\' 7"  (1.702 m)   Body mass index is 23.68 kg/m. Physical Exam  Constitutional: She appears well-developed.  HENT:  Head: Normocephalic.  Mouth/Throat: Oropharynx is clear and moist.  Eyes: Pupils are equal, round, and reactive to light.  Neck: Neck supple.  Cardiovascular: Normal rate and regular rhythm.  Pulmonary/Chest: Effort normal and breath sounds normal. No stridor. No respiratory distress.  Abdominal: Soft. Bowel sounds are normal. She exhibits no distension. There is no tenderness. There is no guarding.  Musculoskeletal: She exhibits no edema.  Neurological: She is alert.  Does not follow any Commands  Skin: Skin is warm and dry.  Psychiatric: She has a normal mood and affect. Her behavior is normal.    Labs reviewed: Recent Labs    05/04/17 0900 05/10/17 0800 07/24/17 1911  08/10/17 0702  NA 143  --  136 143  K 4.2  --  3.9 4.0  CL 106  --  102 105  CO2 25  --  23 26  GLUCOSE 92  --  147* 87  BUN 61*  --  55* 43*  CREATININE 2.26*  --  1.98* 1.82*  CALCIUM 9.8  --  8.8* 9.5  MG  --  2.7*  --   --    Recent Labs    04/10/17 1200 04/12/17 0710 07/24/17 1911  AST 60* 30 23  ALT 68* 50 13*  ALKPHOS 87 72 79  BILITOT 0.5 0.7 0.5  PROT 8.4* 7.7 7.0  ALBUMIN 4.4 4.0 3.6   Recent Labs    04/12/17 0710 07/24/17 1911 08/10/17 0702  WBC 8.0 8.2 6.5  NEUTROABS 6.0 7.2 4.5  HGB 14.0 10.0* 12.1  HCT 46.2* 32.4* 38.8  37.2  MCV 95.3 92.6 90.4  PLT 215 129* 250   Lab Results  Component Value Date   TSH 0.823 04/10/2017   Lab Results  Component Value Date   HGBA1C  05/17/2007    5.4 (NOTE)   The ADA recommends the following therapeutic goals for glycemic   control related to Hgb A1C measurement:   Goal of Therapy:   < 7.0% Hgb A1C   Action Suggested:  > 8.0% Hgb A1C   Ref:  Diabetes Care, 22, Suppl. 1, 1999   Lab Results  Component Value Date   CHOL  05/22/2007    175        ATP III CLASSIFICATION:  <200     mg/dL   Desirable  200-239  mg/dL   Borderline High  >=240    mg/dL   High   HDL 37 (L) 05/22/2007   LDLCALC  05/22/2007    96        Total Cholesterol/HDL:CHD Risk Coronary Heart Disease Risk Table                     Men   Women  1/2 Average Risk   3.4   3.3   TRIG 208 (H) 05/22/2007   CHOLHDL 4.7 05/22/2007    Significant Diagnostic Results in last 30 days:  No results found.  Assessment/Plan Essential hypertension Blood pressure stable on Norvasc 7.5 mg  Dysphagia and gastroparesis Patient is on Reglan BID, Zofran QID  and Prilosec She only takes liquid and does not eat With help of her daughter and nurses her weight has been stable But daughter is concerned about her being more sleepy during the daytime. At this time we will decrease the dose of Zofran to twice daily We will continue Reglan and Prilosec D/W Nurses  and her daughter in Details.  Alzheimer's dementia Continues to be full Care.  She is comfort care  Renal insufficiency Worsening of her renal function  no more work-up  Family/ staff Communication:  Labs/tests ordered:  Total time spent in this patient care encounter was _25 minutes; greater than 50% of the visit spent counseling patient, reviewing records , Labs and coordinating care for problems addressed at this encounter.

## 2017-11-06 DIAGNOSIS — I739 Peripheral vascular disease, unspecified: Secondary | ICD-10-CM | POA: Diagnosis not present

## 2017-11-06 DIAGNOSIS — B351 Tinea unguium: Secondary | ICD-10-CM | POA: Diagnosis not present

## 2017-11-15 ENCOUNTER — Encounter: Payer: Self-pay | Admitting: Internal Medicine

## 2017-11-15 ENCOUNTER — Non-Acute Institutional Stay (SKILLED_NURSING_FACILITY): Payer: Medicare Other | Admitting: Internal Medicine

## 2017-11-15 DIAGNOSIS — I10 Essential (primary) hypertension: Secondary | ICD-10-CM | POA: Diagnosis not present

## 2017-11-15 DIAGNOSIS — G300 Alzheimer's disease with early onset: Secondary | ICD-10-CM | POA: Diagnosis not present

## 2017-11-15 DIAGNOSIS — F0281 Dementia in other diseases classified elsewhere with behavioral disturbance: Secondary | ICD-10-CM

## 2017-11-15 DIAGNOSIS — R1314 Dysphagia, pharyngoesophageal phase: Secondary | ICD-10-CM

## 2017-11-15 DIAGNOSIS — K3184 Gastroparesis: Secondary | ICD-10-CM | POA: Diagnosis not present

## 2017-11-15 DIAGNOSIS — N289 Disorder of kidney and ureter, unspecified: Secondary | ICD-10-CM

## 2017-11-15 DIAGNOSIS — F02818 Dementia in other diseases classified elsewhere, unspecified severity, with other behavioral disturbance: Secondary | ICD-10-CM

## 2017-11-15 NOTE — Progress Notes (Signed)
Location:    [facility] Room Number: 110/W Place of Service:  SNF [phone]) Provider:  Granville Lewis PA-C  [name], MD  Patient Care Team: [name], MD as PCP - General (Internal Medicine) [name], [name], MD as Consulting Physician (Gastroenterology)  Extended Emergency Contact Information Primary Emergency Contact: [name] Address: [address]          [place], [place] [address] [name] of [place] Phone: [phone] Work Phone: [phone] Mobile Phone: [phone] Relation: Daughter Secondary Emergency Contact: [name] Address: [place]          [place], [place] [address] [name] of [place] Phone: [phone] Mobile Phone: [phone] Relation: Relative  Code Status:  DNR Goals of care: Advanced Directive information Advanced Directives 11/15/2017  Does Patient Have a Medical Advance Directive? Yes  Type of Advance Directive Out of facility DNR (pink MOST or yellow form)  Does patient want to make changes to medical advance directive? No - Patient declined  Copy of [employer] in Chart? No - copy requested  Would patient like information on creating a medical advance directive? No - Patient declined  Pre-existing out of facility DNR order (yellow form or pink MOST form) -     Chief Complaint  Patient presents with  . Medical Management of Chronic Issues    Patient si being seen for Routine Visit  . Acute Visit    Patient is being seen for Cellulitis    Routine visit for medical management of chronic medical conditions including advanced dementia-gastroparesis-dysphasia- history of chronic renal disease-hypertension-.  Acute visit secondary to suspected right arm cellulitis   :HPI  Pt is a 82 y.o. female seen today for medical management of chronic diseases.    also follow up  some increased erythema status post skin tear on the right arm  Patient is a long-term resident of the  facility- under essentially comfort measures with a history of advanced dementia she is doing pretty well with supportive care.  Her weight is stable at around 150 pounds she largely just takes liquids but her daughter who is a dietitian in the facility as well as staff have been aggressive and pushing this and apparently has been quite effective  She does have a history of gastroparesis is with dysphasia-at one point had significant vomiting-but this has largely cleared up she is on Zofran and Reglan both twice daily-Zofran was recently reduced secondary to sedation concerns.  She also has a history of hypertension which appears relatively well controlled on Norvasc 7.5 mg a day-recent systolics have been in the 130-140 range.  In regards to chronic renal disease this appears relatively stable with a creatinine of 1.82 BUN of 43 April-we have been very conservative with labs secondary to daughter's wishes for least invasive measures possible  She has been noted to have a skin tear on her right arm and there is now some slight erythema around the area- she does have a history of cellulitis and usually responds well to a course of doxycycline  She also has a distant history of breast cancer with mastectomy at one point had been on medication but per family wishes for medication simplification this is been discontinued.  She is afebrile she cannot really give any review of systems secondary to severe dementia  Past Medical History:  Diagnosis Date  . Cancer ([facility])   . Dementia   . Gallstones   . Gastroparalysis   . HTN (  hypertension)   . Infiltrating ductal carcinoma of right female breast ([facility]) 12/02/2009   Qualifier: History of  By: [name] CMA [name]), June    . Osteoporosis 11/18/2011  . Reflux   . Renal disorder    renal insufficiency.    Past Surgical History:  Procedure Laterality Date  . BREAST BIOPSY    . BREAST LUMPECTOMY    . COLONOSCOPY  2009   Dr. Fuller Plan: normal  .  ESOPHAGOGASTRODUODENOSCOPY  12/11/2009   Dr. Fuller Plan: hiatal hernia  . ESOPHAGOGASTRODUODENOSCOPY N/A 01/29/2014   Procedure: ESOPHAGOGASTRODUODENOSCOPY (EGD);  Surgeon: [name], MD;  [facility];  Service: Endoscopy;  Laterality: N/A;  245  . TONSILLECTOMY AND ADENOIDECTOMY      Allergies  Allergen Reactions  . Aricept [Donepezil Hydrochloride] Nausea And Vomiting  . Exelon [Rivastigmine Tartrate]   . Lithium Nausea And Vomiting    Outpatient Encounter Medications as of 11/15/2017  Medication Sig  . acetaminophen (TYLENOL) 325 MG tablet Take one tablet by mouth at bedtime, take 650 mg by mouth every 4 hours prn  . amLODipine (NORVASC) 2.5 MG tablet Take 2.5 mg by mouth along with 5 mg to = 7.5 mg once a day  . amLODipine (NORVASC) 5 MG tablet Take 5 mg by mouth along with 2.5 mg to = 7.5 mg once a day  . docusate (COLACE) 50 MG/5ML liquid Take 100 mg by mouth daily.  . metoCLOPramide (REGLAN) 5 MG tablet Take 5 mg by mouth 2 (two) times daily.   . Multiple Vitamins-Minerals (MULTIVITAMINS/MINERALS ADULT) LIQD Take 5 mLs by mouth daily.  . Olopatadine HCl 0.2 % SOLN Place 1 drop into both eyes daily as needed.  [name] omeprazole (PRILOSEC) 20 MG capsule Take 20 mg by mouth daily.  . ondansetron (ZOFRAN-ODT) 4 MG disintegrating tablet Dissolve 1 tablet in mouth 2 times daily before meals and at bedtime for nausea  . polyethylene glycol (MIRALAX / GLYCOLAX) packet Take 17 g by mouth daily.    [name] senna-docusate (SENOKOT-S) 8.6-50 MG tablet Take 2 tablets by mouth at bedtime.    No facility-administered encounter medications on file as of 11/15/2017.      Review of Systems   Essentially unobtainable secondary to dementia please see HPI  Immunization History  Administered Date(s) Administered  . Influenza-Unspecified 02/13/2014, 02/09/2016, 02/10/2017  . Pneumococcal Conjugate-13 02/10/2017  . Pneumococcal Polysaccharide-23 02/29/2016  . Tdap 01/30/2017   Pertinent   Health Maintenance Due  Topic Date Due  . INFLUENZA VACCINE  12/07/2017  . DEXA SCAN  Completed  . PNA vac Low Risk Adult  Completed   Fall Risk  01/26/2017  Falls in the past year? No   Functional Status Survey:    Vitals:   11/15/17 1145  BP: 136/84  Pulse: 74  Resp: 20  Temp: 97.8 F (36.6 C)  TempSrc: Oral  SpO2: 96%  Weight is stable at 150.2 pounds Physical Exam   In general this is a somewhat frail elderly female sitting comfortably in her wheelchair  Her skin is warm and dry superior to the right elbow area there is a small open area appears to be a skin tear with approximately 2 to 3 cm of surrounding erythema there is no drainage-there is slight warmth  Eyes visual acuity appears grossly intact sclera and conjunctive are clear.  Oropharynx is clear mucous membranes moist.  Chest is clear to auscultation with somewhat poor respiratory effort.  Heart distant heart sounds from what I could ascertain regular  rate and rhythm-she has a small amount of lower extremity edema  Soft nontender with positive bowel sounds.  Musculoskeletal appears to be at baselinee holds her upper extremities in somewhat contracted position which is not new  Neurologic appears grossly intact she is alert does not really speak much at all  Psych has advanced dementia nursing does not really report any recent behaviors she does not really follow verbal commands   Labs reviewed: Recent Labs    05/04/17 0900 05/10/17 0800 07/24/17 1911 08/10/17 0702  NA 143  --  136 143  K 4.2  --  3.9 4.0  CL 106  --  102 105  CO2 25  --  23 26  GLUCOSE 92  --  147* 87  BUN 61*  --  55* 43*  CREATININE 2.26*  --  1.98* 1.82*  CALCIUM 9.8  --  8.8* 9.5  MG  --  2.7*  --   --    Recent Labs    04/10/17 1200 04/12/17 0710 07/24/17 1911  AST 60* 30 23  ALT 68* 50 13*  ALKPHOS 87 72 79  BILITOT 0.5 0.7 0.5  PROT 8.4* 7.7 7.0  ALBUMIN 4.4 4.0 3.6   Recent Labs    04/12/17 0710  07/24/17 1911 08/10/17 0702  WBC 8.0 8.2 6.5  NEUTROABS 6.0 7.2 4.5  HGB 14.0 10.0* 12.1  HCT 46.2* 32.4* 38.8  37.2  MCV 95.3 92.6 90.4  PLT 215 129* 250   Lab Results  Component Value Date   TSH 0.823 04/10/2017   Lab Results  Component Value Date   HGBA1C  05/17/2007    5.4 (NOTE)   The ADA recommends the following therapeutic goals for glycemic   control related to Hgb A1C measurement:   Goal of Therapy:   < 7.0% Hgb A1C   Action Suggested:  > 8.0% Hgb A1C   Ref:  Diabetes Care, 22, Suppl. 1, 1999   Lab Results  Component Value Date   CHOL  05/22/2007    175        ATP III CLASSIFICATION:  <200     mg/dL   Desirable  200-239  mg/dL   Borderline High  >=240    mg/dL   High   HDL 37 (L) 05/22/2007   LDLCALC  05/22/2007    96        Total Cholesterol/HDL:CHD Risk Coronary Heart Disease Risk Table                     Men   Women  1/2 Average Risk   3.4   3.3   TRIG 208 (H) 05/22/2007   CHOLHDL 4.7 05/22/2007    Significant Diagnostic Results in last 30 days:  No results found.  Assessment/Plan  : #1 history of advanced dementia-she appears to be doing well with supportive care weight is stable largely only drinks liquid supplements but this has been pursued aggressively  #2- history of dysphasia gastroparesis- this appears to be stabilized on routine Zofran twice a day as well as Reglan twice a day- she is also on a proton pump inhibitor.  Zofran dose has been reduced to twice daily secondary to sedation concerns appears clinically gastroparesis is stable.  3.  Hypertension this appears stable on current dose of Norvasc as noted above.  4 history of renal insufficiency creatinine of 1.82 BUN in the 40s appears to be relatively baseline-again we have been minimizing any lab draws secondary to  concerns for comfort care and minimally invasive interventions  #5 anemia-hemoglobin has shown relative stability at 12.1  #6 history of breast cancer again she is status  post mastectomy not on medication secondary to family wishes for conservative management   #7- suspected developing right arm cellulitis will start a course of doxycycline 100 mg twice daily for 7 days-as well as a probiotic twice daily x10 days--she has responded well to this in the past will monitor for any changes  [phone]

## 2017-11-29 ENCOUNTER — Non-Acute Institutional Stay (SKILLED_NURSING_FACILITY): Payer: Medicare Other | Admitting: Internal Medicine

## 2017-11-29 ENCOUNTER — Encounter: Payer: Self-pay | Admitting: Internal Medicine

## 2017-11-29 DIAGNOSIS — L03114 Cellulitis of left upper limb: Secondary | ICD-10-CM | POA: Diagnosis not present

## 2017-11-29 NOTE — Progress Notes (Signed)
Location:   [facility] Room Number: 110/W Place of Service:  SNF [phone]) Provider:  Freddi Starr, MD  Patient Care Team: [name], MD as PCP - General (Internal Medicine) [name], [name], MD as Consulting Physician (Gastroenterology)  Extended Emergency Contact Information Primary Emergency Contact: [name] Address: [address]          [place], [place] [address] [name] of [place] Phone: [phone] Work Phone: [phone] Mobile Phone: [phone] Relation: Daughter Secondary Emergency Contact: [name] Address: [place]          [place], [place] [address] [name] of [place] Phone: [phone] Mobile Phone: [phone] Relation: Relative  Code Status:  DNR Goals of care: Advanced Directive information Advanced Directives 11/29/2017  Does Patient Have a Medical Advance Directive? Yes  Type of Advance Directive Out of facility DNR (pink MOST or yellow form)  Does patient want to make changes to medical advance directive? No - Patient declined  Copy of [employer] in Chart? No - copy requested  Would patient like information on creating a medical advance directive? No - Patient declined  Pre-existing out of facility DNR order (yellow form or pink MOST form) -    Chief complaint-acute visit follow-up suspected cellulitis left arm  HPI:  Pt is a 82 y.o. female seen today for an acute visit for suspected developing cellulitis on left arm.  Patient is a long-term resident of facility with a history of advanced dementia as well as gastroparesis- as well as dysphasia  She also at times will develop cellulitis from skin tears her skin is quite fragile.  She usually responds well to a short course of doxycycline.   She does have a small skin tear of the left elbow area-there is now some surrounding erythema  She has been afebrile does not really appear to complain of pain-there is  no drainage or bleeding       Past Medical History:  Diagnosis Date  . Cancer ([facility])   . Dementia   . Gallstones   . Gastroparalysis   . HTN (hypertension)   . Infiltrating ductal carcinoma of right female breast ([facility]) 12/02/2009   Qualifier: History of  By: [name] CMA [name]), June    . Osteoporosis 11/18/2011  . Reflux   . Renal disorder    renal insufficiency.    Past Surgical History:  Procedure Laterality Date  . BREAST BIOPSY    . BREAST LUMPECTOMY    . COLONOSCOPY  2009   Dr. Fuller Plan: normal  . ESOPHAGOGASTRODUODENOSCOPY  12/11/2009   Dr. Fuller Plan: hiatal hernia  . ESOPHAGOGASTRODUODENOSCOPY N/A 01/29/2014   Procedure: ESOPHAGOGASTRODUODENOSCOPY (EGD);  Surgeon: [name], MD;  [facility];  Service: Endoscopy;  Laterality: N/A;  245  . TONSILLECTOMY AND ADENOIDECTOMY      Allergies  Allergen Reactions  . Aricept [Donepezil Hydrochloride] Nausea And Vomiting  . Exelon [Rivastigmine Tartrate]   . Lithium Nausea And Vomiting    Outpatient Encounter Medications as of 11/29/2017  Medication Sig  . acetaminophen (TYLENOL) 325 MG tablet Take one tablet by mouth at bedtime, take 650 mg by mouth every 4 hours prn  . amLODipine (NORVASC) 2.5 MG tablet Take 2.5 mg by mouth along with 5 mg to = 7.5 mg once a day  . amLODipine (NORVASC) 5 MG tablet Take 5 mg by mouth along with 2.5 mg to = 7.5 mg once a day  . docusate (COLACE)  50 MG/5ML liquid Take 100 mg by mouth daily.  . metoCLOPramide (REGLAN) 5 MG tablet Take 5 mg by mouth 2 (two) times daily.   . Multiple Vitamins-Minerals (MULTIVITAMINS/MINERALS ADULT) LIQD Take 5 mLs by mouth daily.  . Olopatadine HCl 0.2 % SOLN Place 1 drop into both eyes daily as needed.  [name] omeprazole (PRILOSEC) 20 MG capsule Take 20 mg by mouth daily.  . ondansetron (ZOFRAN-ODT) 4 MG disintegrating tablet Dissolve 1 tablet in mouth 2 times daily before meals and at bedtime for nausea  . polyethylene glycol (MIRALAX / GLYCOLAX) packet  Take 17 g by mouth daily.    [name] senna-docusate (SENOKOT-S) 8.6-50 MG tablet Take 2 tablets by mouth at bedtime.    No facility-administered encounter medications on file as of 11/29/2017.     Review of Systems   --Essentially unobtainable secondary to dementia please see HPI  Immunization History  Administered Date(s) Administered  . Influenza-Unspecified 02/13/2014, 02/09/2016, 02/10/2017  . Pneumococcal Conjugate-13 02/10/2017  . Pneumococcal Polysaccharide-23 02/29/2016  . Tdap 01/30/2017   Pertinent  Health Maintenance Due  Topic Date Due  . INFLUENZA VACCINE  12/07/2017  . DEXA SCAN  Completed  . PNA vac Low Risk Adult  Completed   Fall Risk  01/26/2017  Falls in the past year? No   Functional Status Survey:    Vitals:   11/29/17 1127  BP: 129/67  Pulse: 68  Resp: 17  Temp: 98.1 F (36.7 C)  TempSrc: Oral  SpO2: 97%    Physical Exam   In general this is a somewhat frail-appearing elderly female in no distress sitting comfortably in her wheelchair-  Her skin is warm and dry on the left elbow area there is a very small skin tear but there is approximately 3 cm of surrounding erythema that is slightly warm to touch there is no active drainage or bleeding--this does not appear to be acutely tender  Eyes sclera and conjunctive are clear visual acuity appears grossly intact.  Oropharynx was difficult to assess since patient did not really open her mouth  Chest is clear to auscultation with poor respiratory effort no labored breathing   Heart is regular rate and rhythm without murmur gallop or rub she has a small amount of dependent edema   Abdomen is soft nontender with positive bowel sounds  Musculoskeletal moves extremities at baseline with lower extremity weakness-holds her upper extremities and somewhat contracted position but does move them with gentle range of motion   Neurologic she is alert at times will make eye contact at times -- and does speak  occasionally although becoming less so as dementia progresses--  Psych as noted above  Labs reviewed: Recent Labs    05/04/17 0900 05/10/17 0800 07/24/17 1911 08/10/17 0702  NA 143  --  136 143  K 4.2  --  3.9 4.0  CL 106  --  102 105  CO2 25  --  23 26  GLUCOSE 92  --  147* 87  BUN 61*  --  55* 43*  CREATININE 2.26*  --  1.98* 1.82*  CALCIUM 9.8  --  8.8* 9.5  MG  --  2.7*  --   --    Recent Labs    04/10/17 1200 04/12/17 0710 07/24/17 1911  AST 60* 30 23  ALT 68* 50 13*  ALKPHOS 87 72 79  BILITOT 0.5 0.7 0.5  PROT 8.4* 7.7 7.0  ALBUMIN 4.4 4.0 3.6   Recent Labs    04/12/17  0710 07/24/17 1911 08/10/17 0702  WBC 8.0 8.2 6.5  NEUTROABS 6.0 7.2 4.5  HGB 14.0 10.0* 12.1  HCT 46.2* 32.4* 38.8  37.2  MCV 95.3 92.6 90.4  PLT 215 129* 250   Lab Results  Component Value Date   TSH 0.823 04/10/2017   Lab Results  Component Value Date   HGBA1C  05/17/2007    5.4 (NOTE)   The ADA recommends the following therapeutic goals for glycemic   control related to Hgb A1C measurement:   Goal of Therapy:   < 7.0% Hgb A1C   Action Suggested:  > 8.0% Hgb A1C   Ref:  Diabetes Care, 22, Suppl. 1, 1999   Lab Results  Component Value Date   CHOL  05/22/2007    175        ATP III CLASSIFICATION:  <200     mg/dL   Desirable  200-239  mg/dL   Borderline High  >=240    mg/dL   High   HDL 37 (L) 05/22/2007   LDLCALC  05/22/2007    96        Total Cholesterol/HDL:CHD Risk Coronary Heart Disease Risk Table                     Men   Women  1/2 Average Risk   3.4   3.3   TRIG 208 (H) 05/22/2007   CHOLHDL 4.7 05/22/2007    Significant Diagnostic Results in last 30 days:  No results found.  Assessment/Pla #1-history of cellulitis this time left arm-will start a short course of doxycycline 100 mg twice daily for 5 days-also will add a probiotic for 7 days-she has responded quite well to this in the past monitor for resolution  [place], [place],  [place]

## 2017-12-12 ENCOUNTER — Encounter: Payer: Self-pay | Admitting: Internal Medicine

## 2017-12-12 ENCOUNTER — Non-Acute Institutional Stay (SKILLED_NURSING_FACILITY): Payer: Medicare Other | Admitting: Internal Medicine

## 2017-12-12 DIAGNOSIS — M79604 Pain in right leg: Secondary | ICD-10-CM | POA: Diagnosis not present

## 2017-12-12 DIAGNOSIS — M79605 Pain in left leg: Secondary | ICD-10-CM | POA: Diagnosis not present

## 2017-12-12 DIAGNOSIS — M79606 Pain in leg, unspecified: Secondary | ICD-10-CM | POA: Insufficient documentation

## 2017-12-12 NOTE — Progress Notes (Signed)
Location:   [facility] Room Number: 110/W Place of Service:  SNF [phone]) Provider:  Freddi Starr, MD  Patient Care Team: [name], MD as PCP - General (Internal Medicine) [name], [name], MD as Consulting Physician (Gastroenterology)  Extended Emergency Contact Information Primary Emergency Contact: [name] Address: [address]          [place], [place] [address] [name] of [place] Phone: [phone] Work Phone: [phone] Mobile Phone: [phone] Relation: Daughter Secondary Emergency Contact: [name] Address: [address]          [place], [place] [address] [name] of [place] Phone: [phone] Mobile Phone: [phone] Relation: Relative  Code Status:  DNR Goals of care: Advanced Directive information Advanced Directives 12/12/2017  Does Patient Have a Medical Advance Directive? Yes  Type of Advance Directive Out of facility DNR (pink MOST or yellow form)  Does patient want to make changes to medical advance directive? No - Patient declined  Copy of [place] in Chart? No - copy requested  Would patient like information on creating a medical advance directive? No - Patient declined  Pre-existing out of facility DNR order (yellow form or pink MOST form) -     Chief Complaint  Patient presents with  . Acute Visit    Patient c/o Leg Pain    HPI:  Pt is a 82 y.o. female seen today for an acute visit for complaints of leg pain.  Patient is a long-term resident of the facility with a history of severe dementiasevere dementia and is under essentially comfort measures and is under essentially comfort measures.    She does have a history of gastroparesis with dysphasia but has done well withShe does have a history of gastroparesis with dysphagia but has done well with Reglan twice a day- Reglan twice a day- as well as a proton pump inhibitoras well as a proton pump  inhibitor.   She also has a history of chronic kidney disease as well as hypertension- a distant history of breast cancer family does not does not desire aggressive follow-up but no longer on medicationon   Her daughter feels she is having some increased leg pain and I am following up on this---  There ihas beenno recent history of trauma or falls  Vital signs appear to be stable .     Past Medical History:  Diagnosis Date  . Cancer ([place])   . Dementia   . Gallstones   . Gastroparalysis   . HTN (hypertension)   . Infiltrating ductal carcinoma of right female breast ([facility]) 12/02/2009   Qualifier: History of  By: [name] CMA [name]), June    . Osteoporosis 11/18/2011  . Reflux   . Renal disorder    renal insufficiency.    Past Surgical History:  Procedure Laterality Date  . BREAST BIOPSY    . BREAST LUMPECTOMY    . COLONOSCOPY  2009   Dr. Fuller Plan: normal  . ESOPHAGOGASTRODUODENOSCOPY  12/11/2009   Dr. Fuller Plan: hiatal hernia  . ESOPHAGOGASTRODUODENOSCOPY N/A 01/29/2014   Procedure: ESOPHAGOGASTRODUODENOSCOPY (EGD);  Surgeon: [name], MD;  [facility];  Service: Endoscopy;  Laterality: N/A;  245  . TONSILLECTOMY AND ADENOIDECTOMY      Allergies  Allergen Reactions  . Aricept [Donepezil Hydrochloride] Nausea And Vomiting  . Exelon [Rivastigmine Tartrate]   . Lithium Nausea And Vomiting    Outpatient Encounter Medications as of 12/12/2017  Medication Sig  .  acetaminophen (TYLENOL) 325 MG tablet Take one tablet by mouth at bedtime, take 650 mg by mouth every 4 hours prn  . amLODipine (NORVASC) 2.5 MG tablet Take 2.5 mg by mouth along with 5 mg to = 7.5 mg once a day  . amLODipine (NORVASC) 5 MG tablet Take 5 mg by mouth along with 2.5 mg to = 7.5 mg once a day  . docusate (COLACE) 50 MG/5ML liquid Take 100 mg by mouth daily.  . metoCLOPramide (REGLAN) 5 MG tablet Take 5 mg by mouth 2 (two) times daily.   . Multiple Vitamins-Minerals (MULTIVITAMINS/MINERALS  ADULT) LIQD Take 5 mLs by mouth daily.  . Olopatadine HCl 0.2 % SOLN Place 1 drop into both eyes daily as needed.  [name] omeprazole (PRILOSEC) 20 MG capsule Take 20 mg by mouth daily.  . ondansetron (ZOFRAN-ODT) 4 MG disintegrating tablet Dissolve 1 tablet in mouth 2 times daily before meals and at bedtime for nausea  . polyethylene glycol (MIRALAX / GLYCOLAX) packet Take 17 g by mouth daily.    [name] senna-docusate (SENOKOT-S) 8.6-50 MG tablet Take 2 tablets by mouth at bedtime.    No facility-administered encounter medications on file as of 12/12/2017.     Review of Systems   unattainable secondary to dementia  Immunization History  Administered Date(s) Administered  . Influenza-Unspecified 02/13/2014, 02/09/2016, 02/10/2017  . Pneumococcal Conjugate-13 02/10/2017  . Pneumococcal Polysaccharide-23 02/29/2016  . Tdap 01/30/2017   Pertinent  Health Maintenance Due  Topic Date Due  . INFLUENZA VACCINE  01/12/2018 (Originally 12/07/2017)  . DEXA SCAN  Completed  . PNA vac Low Risk Adult  Completed   Fall Risk  01/26/2017  Falls in the past year? No   Functional Status Survey:     She is afebrile--pulse is 60 respirations 16 BP--130/80 Physical Exam   In general this is a somewhat frail elderly female in no distress sitting comfortably in her wheelchair  Her skin is warm and dry  Previous site of cellulitis left arm appears resolved   Eyes visual acuity appears to be intact sclera and conjunctiva are clear.  Oropharynx was difficult to assess since patient did not open her mouth   Chest is clear to auscultation with poor respiratory effort there is no labored breathing  Abdomen is softer with positive bowel sounds   Heart isregular rate and rhythm without murmur gallop or rub occasional irregular beat   Musculoskeletal appears to move extremities at baseline with chronic lower extremity weakness I could not really appreciate any abnormality other than arthritic of her knees  or legs bilaterally-occasionally when I palpate her legs she does yell out but this appears more reaction to invasive maneuver an acute tenderness  Cannot really appreciate any erythema or firmness   neurologic appears grossly intact could not really appreciate lateralizing findings she does not follow verbal commands  Psych as noted above findings consistent with dementia  Labs reviewed: Recent Labs    05/04/17 0900 05/10/17 0800 07/24/17 1911 08/10/17 0702  NA 143  --  136 143  K 4.2  --  3.9 4.0  CL 106  --  102 105  CO2 25  --  23 26  GLUCOSE 92  --  147* 87  BUN 61*  --  55* 43*  CREATININE 2.26*  --  1.98* 1.82*  CALCIUM 9.8  --  8.8* 9.5  MG  --  2.7*  --   --    Recent Labs    04/10/17 1200 04/12/17  0710 07/24/17 1911  AST 60* 30 23  ALT 68* 50 13*  ALKPHOS 87 72 79  BILITOT 0.5 0.7 0.5  PROT 8.4* 7.7 7.0  ALBUMIN 4.4 4.0 3.6   Recent Labs    04/12/17 0710 07/24/17 1911 08/10/17 0702  WBC 8.0 8.2 6.5  NEUTROABS 6.0 7.2 4.5  HGB 14.0 10.0* 12.1  HCT 46.2* 32.4* 38.8  37.2  MCV 95.3 92.6 90.4  PLT 215 129* 250   Lab Results  Component Value Date   TSH 0.823 04/10/2017   Lab Results  Component Value Date   HGBA1C  05/17/2007    5.4 (NOTE)   The ADA recommends the following therapeutic goals for glycemic   control related to Hgb A1C measurement:   Goal of Therapy:   < 7.0% Hgb A1C   Action Suggested:  > 8.0% Hgb A1C   Ref:  Diabetes Care, 22, Suppl. 1, 1999   Lab Results  Component Value Date   CHOL  05/22/2007    175        ATP III CLASSIFICATION:  <200     mg/dL   Desirable  200-239  mg/dL   Borderline High  >=240    mg/dL   High   HDL 37 (L) 05/22/2007   LDLCALC  05/22/2007    96        Total Cholesterol/HDL:CHD Risk Coronary Heart Disease Risk Table                     Men   Women  1/2 Average Risk   3.4   3.3   TRIG 208 (H) 05/22/2007   CHOLHDL 4.7 05/22/2007    Significant Diagnostic Results in last 30 days:  No results  found.  Assessment/Plan  Assessment and plan  Leg pain-I could not really appreciate any significant abnormalities on exam--suspect possible arthritic discomfort complicated with dementia...will make tytlenolroutine in AM---continue prn tylenol Q 4 hours and rtn as scheduled at night--no more than 3 grams Tylenol in 24 hour period--  Monitor for any changes..  [id number]

## 2017-12-18 ENCOUNTER — Non-Acute Institutional Stay (SKILLED_NURSING_FACILITY): Payer: Medicare Other | Admitting: Internal Medicine

## 2017-12-18 ENCOUNTER — Encounter: Payer: Self-pay | Admitting: Internal Medicine

## 2017-12-18 DIAGNOSIS — M79605 Pain in left leg: Secondary | ICD-10-CM

## 2017-12-18 DIAGNOSIS — M79604 Pain in right leg: Secondary | ICD-10-CM

## 2017-12-18 DIAGNOSIS — K3184 Gastroparesis: Secondary | ICD-10-CM | POA: Diagnosis not present

## 2017-12-18 DIAGNOSIS — I10 Essential (primary) hypertension: Secondary | ICD-10-CM

## 2017-12-18 DIAGNOSIS — F0281 Dementia in other diseases classified elsewhere with behavioral disturbance: Secondary | ICD-10-CM

## 2017-12-18 DIAGNOSIS — G300 Alzheimer's disease with early onset: Secondary | ICD-10-CM

## 2017-12-18 DIAGNOSIS — N289 Disorder of kidney and ureter, unspecified: Secondary | ICD-10-CM | POA: Diagnosis not present

## 2017-12-18 DIAGNOSIS — F02818 Dementia in other diseases classified elsewhere, unspecified severity, with other behavioral disturbance: Secondary | ICD-10-CM

## 2017-12-18 NOTE — Progress Notes (Signed)
Location:    [facility] Room Number: 110/W Place of Service:  SNF [id number]) Provider: Granville Lewis PA-C  [name], MD  Patient Care Team: [name], MD as PCP - General (Internal Medicine) [name], [name], MD as Consulting Physician (Gastroenterology)  Extended Emergency Contact Information Primary Emergency Contact: [name] Address: [address]          [place], [place] [address] [name] of [place] Phone: [phone] Work Phone: [phone] Mobile Phone: [phone] Relation: Daughter Secondary Emergency Contact: [name] Address: [address]          [place], [place] [address] [name] of [place] Phone: [phone] Mobile Phone: [phone] Relation: Relative  Code Status:  DNR Goals of care: Advanced Directive information Advanced Directives 12/18/2017  Does Patient Have a Medical Advance Directive? Yes  Type of Advance Directive Out of facility DNR (pink MOST or yellow form)  Does patient want to make changes to medical advance directive? -  Copy of [place] in Chart? No - copy requested  Would patient like information on creating a medical advance directive? No - Patient declined  Pre-existing out of facility DNR order (yellow form or pink MOST form) -     Chief Complaint  Patient presents with  . Medical Management of Chronic Issues    Patient is being seen for routine visit of medical management  Chronic medical conditions include advanced dementia in addition to dysphasia-with gastroparesis- history of chronic renal disease in addition to hypertension    HPI:  [name] is a 82 y.o. female seen today for medical management of chronic diseases.  As noted above.  She was seen recently for possible increase lower leg pain- she is on Tylenol routinely at night- she has PRN Tylenol during the day every 6 hours- intention was to give her routine Tylenol in the morning-and apparently her nurse  today has been given that to her but I do not see the formal order and will write to make sure this is done routinely  Apparently pain has improved- suspect she is getting that Tylenol in the morning fairly consistently--   Her other medical issues appear to be stable at one time gastroparesis and dysphasia was a significant issue--but this appears stabilized her appetite continues to be quite poor for solid foods but she does take liquids encouragement.  Her daughter actually is a dietitian in the facility and is very supportive and feeding her.  She is now on Reglan twice daily as well as Zofran twice daily.  She is under essentially comfort measures with her advanced dementia--weight appears to be relatively stable in the low 150s  She also has a history of hypertension stable I got a blood pressure 120/70 today previous blood pressure 143/77- again we have been fairly conservative here she is on Norvasc 7.5 mg a day  In regards to her kidney issues- last creatinine was 1.82 and BUN of 43 back in April-we have not done additional labs secondary to wishes for conservative care with minimal lab draws-  Again apparently she is drinking fairly well with limited p.o. solid food intake.  So was seen recently for arm cellulitis which appears to have resolved with doxycycline-  Currently nursing does not express any concerns today----vital signs appear to be stable         Past Medical History:  Diagnosis Date  . Cancer ([facility])   . Dementia   . Gallstones   . Gastroparalysis   .  HTN (hypertension)   . Infiltrating ductal carcinoma of right female breast ([facility]) 12/02/2009   Qualifier: History of  By: [name] CMA [name]), June    . Osteoporosis 11/18/2011  . Reflux   . Renal disorder    renal insufficiency.    Past Surgical History:  Procedure Laterality Date  . BREAST BIOPSY    . BREAST LUMPECTOMY    . COLONOSCOPY  2009   Dr. Fuller Plan: normal  . ESOPHAGOGASTRODUODENOSCOPY   12/11/2009   Dr. Fuller Plan: hiatal hernia  . ESOPHAGOGASTRODUODENOSCOPY N/A 01/29/2014   Procedure: ESOPHAGOGASTRODUODENOSCOPY (EGD);  Surgeon: [name], MD;  [facility];  Service: Endoscopy;  Laterality: N/A;  245  . TONSILLECTOMY AND ADENOIDECTOMY      Allergies  Allergen Reactions  . Aricept [Donepezil Hydrochloride] Nausea And Vomiting  . Exelon [Rivastigmine Tartrate]   . Lithium Nausea And Vomiting    Outpatient Encounter Medications as of 12/18/2017  Medication Sig  . acetaminophen (TYLENOL) 325 MG tablet Take one tablet by mouth at bedtime, take 650 mg by mouth every 4 hours prn  . amLODipine (NORVASC) 2.5 MG tablet Take 2.5 mg by mouth along with 5 mg to = 7.5 mg once a day  . amLODipine (NORVASC) 5 MG tablet Take 5 mg by mouth along with 2.5 mg to = 7.5 mg once a day  . docusate (COLACE) 50 MG/5ML liquid Take 100 mg by mouth daily.  . metoCLOPramide (REGLAN) 5 MG tablet Take 5 mg by mouth 2 (two) times daily.   . Multiple Vitamins-Minerals (MULTIVITAMINS/MINERALS ADULT) LIQD Take 5 mLs by mouth daily.  . Olopatadine HCl 0.2 % SOLN Place 1 drop into both eyes daily as needed.  [name] omeprazole (PRILOSEC) 20 MG capsule Take 20 mg by mouth daily.  . ondansetron (ZOFRAN-ODT) 4 MG disintegrating tablet Dissolve 1 tablet in mouth 2 times daily before meals and at bedtime for nausea  . polyethylene glycol (MIRALAX / GLYCOLAX) packet Take 17 g by mouth daily.    [name] senna-docusate (SENOKOT-S) 8.6-50 MG tablet Take 2 tablets by mouth at bedtime.    No facility-administered encounter medications on file as of 12/18/2017.      Review of Systems   Unobtainable secondary to dementia please see HPI  Immunization History  Administered Date(s) Administered  . Influenza-Unspecified 02/13/2014, 02/09/2016, 02/10/2017  . Pneumococcal Conjugate-13 02/10/2017  . Pneumococcal Polysaccharide-23 02/29/2016  . Tdap 01/30/2017   Pertinent  Health Maintenance Due  Topic Date Due  .  INFLUENZA VACCINE  01/12/2018 (Originally 12/07/2017)  . DEXA SCAN  Completed  . PNA vac Low Risk Adult  Completed   Fall Risk  01/26/2017  Falls in the past year? No   Functional Status Survey:    Vitals:   12/18/17 1500  BP: (!) 143/77  Pulse: 85  Resp: 20  Temp: (!) 97.1 F (36.2 C)  TempSrc: Oral  SpO2: 91%  Weight: 154 lb 6.4 oz (70 kg)  Height: 5\' 7"  (1.702 m)   Body mass index is 24.18 kg/m. Physical Exam   In general this is a somewhat frail elderly female in no distress sitting comfortably in her wheelchair.  Her skin is warm and dry do not really see evidence of cellulitis on her arms today.  Eyes she appears to be at baseline visual acuity intact she does occasionally make eye contact sclera and conjunctive are clear.  Oropharynx clear mucous membranes moist.  Chest is clear to auscultation with poor respiratory effort she does not really  follow verbal commands there is no labored breathing.  Heart is regular rhythm somewhat bradycardic in the 50s- she has a little bit of lower extremity edema this appears to be dependent related.  Abdomen is soft nontender with positive bowel sounds  Musculoskeletal appears able to move her extremities at baseline has chronic lower extremity weakness does move her upper extremities but at times will hold them in a somewhat crossed contracted position  Could not really appreciate grimacing when I palpated her lower legs today-does not appear to be having pain at this time   Neurologic she is alert does not really follow verbal commands  Psych again findings consistent with advanced dementia and she is not overtly agitated with exam today  Labs reviewed: Recent Labs    05/04/17 0900 05/10/17 0800 07/24/17 1911 08/10/17 0702  NA 143  --  136 143  K 4.2  --  3.9 4.0  CL 106  --  102 105  CO2 25  --  23 26  GLUCOSE 92  --  147* 87  BUN 61*  --  55* 43*  CREATININE 2.26*  --  1.98* 1.82*  CALCIUM 9.8  --  8.8* 9.5    MG  --  2.7*  --   --    Recent Labs    04/10/17 1200 04/12/17 0710 07/24/17 1911  AST 60* 30 23  ALT 68* 50 13*  ALKPHOS 87 72 79  BILITOT 0.5 0.7 0.5  PROT 8.4* 7.7 7.0  ALBUMIN 4.4 4.0 3.6   Recent Labs    04/12/17 0710 07/24/17 1911 08/10/17 0702  WBC 8.0 8.2 6.5  NEUTROABS 6.0 7.2 4.5  HGB 14.0 10.0* 12.1  HCT 46.2* 32.4* 38.8  37.2  MCV 95.3 92.6 90.4  PLT 215 129* 250   Lab Results  Component Value Date   TSH 0.823 04/10/2017   Lab Results  Component Value Date   HGBA1C  05/17/2007    5.4 (NOTE)   The ADA recommends the following therapeutic goals for glycemic   control related to Hgb A1C measurement:   Goal of Therapy:   < 7.0% Hgb A1C   Action Suggested:  > 8.0% Hgb A1C   Ref:  Diabetes Care, 22, Suppl. 1, 1999   Lab Results  Component Value Date   CHOL  05/22/2007    175        ATP III CLASSIFICATION:  <200     mg/dL   Desirable  200-239  mg/dL   Borderline High  >=240    mg/dL   High   HDL 37 (L) 05/22/2007   LDLCALC  05/22/2007    96        Total Cholesterol/HDL:CHD Risk Coronary Heart Disease Risk Table                     Men   Women  1/2 Average Risk   3.4   3.3   TRIG 208 (H) 05/22/2007   CHOLHDL 4.7 05/22/2007    Significant Diagnostic Results in last 30 days:  No results found.  Assessment/Plan T #1-leg pain- again will change Tylenol to routine in a.m. and p.m. and continue every 6 hours dosing  Prn--no  more more than 3 g Tylenol in 24-hour.   #2- history of severe dementia-she appears to do well with supportive care weight appears to be relatively stable-largely drinks liquid supplements with poor p.o. solid intake  #3 history of gastroparesis dysphasia- she is on Zofran  and Reglan twice a day as well as a proton pump inhibitor this appears to be stabilized-at one point had significant vomiting dysphasia but this appears to have resolved for the time being   #4-history of renal insufficiency as noted above we have done  minimal lab draws secondary to comfort care status- creatinine 1.82 BUN in the 40s on most recent lab April appears to be relatively baseline  #5-hypertension as noted above this appears stabilized on Norvasc  #6- anemia- hemoglobin has shown stability as well at 12.1 on most recent lab in April again we are doing minimal lab draws as noted above   #7- distant history of breast cancer with mastectomy- again her tamoxifen has been discontinued per family wishes for medication streamlining and simplification   [phone]

## 2018-01-13 DIAGNOSIS — R05 Cough: Secondary | ICD-10-CM | POA: Diagnosis not present

## 2018-01-15 ENCOUNTER — Encounter: Payer: Self-pay | Admitting: Internal Medicine

## 2018-01-15 ENCOUNTER — Non-Acute Institutional Stay (SKILLED_NURSING_FACILITY): Payer: Medicare Other | Admitting: Internal Medicine

## 2018-01-15 DIAGNOSIS — I10 Essential (primary) hypertension: Secondary | ICD-10-CM

## 2018-01-15 DIAGNOSIS — L309 Dermatitis, unspecified: Secondary | ICD-10-CM | POA: Diagnosis not present

## 2018-01-15 DIAGNOSIS — J181 Lobar pneumonia, unspecified organism: Secondary | ICD-10-CM | POA: Diagnosis not present

## 2018-01-15 DIAGNOSIS — J189 Pneumonia, unspecified organism: Secondary | ICD-10-CM | POA: Insufficient documentation

## 2018-01-15 NOTE — Progress Notes (Signed)
Location:    [facility] Room Number: 110/W Place of Service:  SNF [phone]) Provider:  Freddi Starr, MD  Patient Care Team: [name], MD as PCP - General (Internal Medicine) [name], [name], MD as Consulting Physician (Gastroenterology)  Extended Emergency Contact Information Primary Emergency Contact: [name] Address: [address]          [place], [place] [address] [name] of [place] Phone: [phone] Work Phone: [phone] Mobile Phone: [phone] Relation: Daughter Secondary Emergency Contact: [name] Address: [address]          [place], [place] [address] [name] of [place] Phone: [phone] Mobile Phone: [phone] Relation: Relative  Code Status:  DNR Goals of care: Advanced Directive information Advanced Directives 01/15/2018  Does Patient Have a Medical Advance Directive? Yes  Type of Advance Directive Out of facility DNR (pink MOST or yellow form)  Does patient want to make changes to medical advance directive? No - Patient declined  Copy of [place] in Chart? No - copy requested  Would patient like information on creating a medical advance directive? No - Patient declined  Pre-existing out of facility DNR order (yellow form or pink MOST form) -     Chief Complaint  Patient presents with  . Acute Visit    Patient is being seen for f/u pneumonia    HPI:  Pt is a 82 y.o. female seen today for an acute visit for follow-up of a cough with x-ray showing a small right base infiltrate.  Patient is a long-term resident of facility largely under comfort measures she does have a MOST form.  She does have a history of severe dementia in addition to dysphasia with gastroparesis as well as history of chronic renal disease as well as hypertension.  Apparently over the weekend she developed a nonproductive cough and an x-ray was ordered with did show a small right base  infiltrate.  She is at some risk for aspiration with her history of dysphasia with dementia.  Currently O2 saturations are in the 90s on room air there is no sign of distress actually her cough is fairly minimal today-she is a poor historian because of dementia and cannot really give any review of systems.  Her blood pressure systolically is somewhat elevated in the 150-170 area today however this tends to come down when she gets her Norvasc in her system- this is complicated at times with agitation as well.  Blood pressure yesterday was 130/86  Currently she is sitting in her chair comfortably no sign of distress-her sed rate has not really noted much cough today.  She has been started on Augmentin to run through September 14-she is also receiving routine Robitussin every 6 hours-for 5 days and then as needed.     Past Medical History:  Diagnosis Date  . Cancer ([facility])   . Dementia   . Gallstones   . Gastroparalysis   . HTN (hypertension)   . Infiltrating ductal carcinoma of right female breast ([facility]) 12/02/2009   Qualifier: History of  By: [name] CMA [name]), June    . Osteoporosis 11/18/2011  . Reflux   . Renal disorder    renal insufficiency.    Past Surgical History:  Procedure Laterality Date  . BREAST BIOPSY    . BREAST LUMPECTOMY    . COLONOSCOPY  2009   Dr. Fuller Plan: normal  . ESOPHAGOGASTRODUODENOSCOPY  12/11/2009   Dr. Fuller Plan: hiatal hernia  .  ESOPHAGOGASTRODUODENOSCOPY N/A 01/29/2014   Procedure: ESOPHAGOGASTRODUODENOSCOPY (EGD);  Surgeon: [name], MD;  [facility];  Service: Endoscopy;  Laterality: N/A;  245  . TONSILLECTOMY AND ADENOIDECTOMY      Allergies  Allergen Reactions  . Aricept [Donepezil Hydrochloride] Nausea And Vomiting  . Exelon [Rivastigmine Tartrate]   . Lithium Nausea And Vomiting    Outpatient Encounter Medications as of 01/15/2018  Medication Sig  . acetaminophen (TYLENOL) 325 MG tablet Take two tablets by mouth twice a day,  take 650 mg by mouth every 6 hours prn  . amLODipine (NORVASC) 2.5 MG tablet Take 2.5 mg by mouth along with 5 mg to = 7.5 mg once a day  . amLODipine (NORVASC) 5 MG tablet Take 5 mg by mouth along with 2.5 mg to = 7.5 mg once a day  . amoxicillin-clavulanate (AUGMENTIN) 500-125 MG tablet Take 1 tablet by mouth 2 (two) times daily.  [name] docusate (COLACE) 50 MG/5ML liquid Take 100 mg by mouth daily.  . Doxylamine-DM (ROBITUSSIN NIGHTTIME COUGH DM) 12.5-30 MG/10ML LIQD Take 15 mLs by mouth every 6 (six) hours.  . metoCLOPramide (REGLAN) 5 MG tablet Take 5 mg by mouth 2 (two) times daily.   . Multiple Vitamins-Minerals (MULTIVITAMINS/MINERALS ADULT) LIQD Take 5 mLs by mouth daily.  . Olopatadine HCl 0.2 % SOLN Place 1 drop into both eyes daily as needed.  [name] omeprazole (PRILOSEC) 20 MG capsule Take 20 mg by mouth daily.  . ondansetron (ZOFRAN-ODT) 4 MG disintegrating tablet Dissolve 1 tablet in mouth 2 times daily before meals.  . polyethylene glycol (MIRALAX / GLYCOLAX) packet Take 17 g by mouth daily.    . Probiotic Product (RISA-BID PROBIOTIC PO) Take 1 tablet buy mouth twice a day  . senna-docusate (SENOKOT-S) 8.6-50 MG tablet Take 2 tablets by mouth at bedtime.    No facility-administered encounter medications on file as of 01/15/2018.     Review of Systems   This is unobtainable secondary to dementia please see HPI  Immunization History  Administered Date(s) Administered  . Influenza-Unspecified 02/13/2014, 02/09/2016, 02/10/2017  . Pneumococcal Conjugate-13 02/10/2017  . Pneumococcal Polysaccharide-23 02/29/2016  . Tdap 01/30/2017   Pertinent  Health Maintenance Due  Topic Date Due  . INFLUENZA VACCINE  02/14/2018 (Originally 12/07/2017)  . DEXA SCAN  Completed  . PNA vac Low Risk Adult  Completed   Fall Risk  01/26/2017  Falls in the past year? No   Functional Status Survey:    Vitals:   01/15/18 1221  BP: (!) 159/84  Pulse: 90  Resp: 19  Temp: 98.8 F (37.1 C)  TempSrc:  Oral  There is no height or weight on file to calculate BMI. Physical Exam Of note manual blood pressure was 170/90   In general this is a somewhat frail elderly female in no distress appears to be at her baseline sitting comfortably in wheelchair.  Uric  Her skin is warm and dry apparently over the weekend a small erythematous patch was noted at the base of her left thumb-according to her sitter this is improved today she is not itching it-.  Eyes visual acuity appears grossly intact sclera and conjunctive are clear I do not see any drainage.  Oropharynx was difficult to assess since patient does not really follow verbal commands.  Chest is clear to auscultation there is no labored breathing she does not really follow verbal commands but could not really appreciate overt congestion on exam.  Heart is somewhat distant heart sounds  regular with an occasional irregular beat-she has mild lower extremity edema this appears to be more dependent related.  Abdomen is soft nontender with active bowel sounds.   Musculoskeletal appears to be at baseline holds her upper extremities in a somewhat contracted position --baseline lower extremity weakness.  Neurologic as noted above she is alert does not follow verbal commands.  Psych findings again consistent with advanced dementia appears somewhat slightly agitated with invasive maneuvers   Labs reviewed: Recent Labs    05/04/17 0900 05/10/17 0800 07/24/17 1911 08/10/17 0702  NA 143  --  136 143  K 4.2  --  3.9 4.0  CL 106  --  102 105  CO2 25  --  23 26  GLUCOSE 92  --  147* 87  BUN 61*  --  55* 43*  CREATININE 2.26*  --  1.98* 1.82*  CALCIUM 9.8  --  8.8* 9.5  MG  --  2.7*  --   --    Recent Labs    04/10/17 1200 04/12/17 0710 07/24/17 1911  AST 60* 30 23  ALT 68* 50 13*  ALKPHOS 87 72 79  BILITOT 0.5 0.7 0.5  PROT 8.4* 7.7 7.0  ALBUMIN 4.4 4.0 3.6   Recent Labs    04/12/17 0710 07/24/17 1911 08/10/17 0702  WBC 8.0  8.2 6.5  NEUTROABS 6.0 7.2 4.5  HGB 14.0 10.0* 12.1  HCT 46.2* 32.4* 38.8  37.2  MCV 95.3 92.6 90.4  PLT 215 129* 250   Lab Results  Component Value Date   TSH 0.823 04/10/2017   Lab Results  Component Value Date   HGBA1C  05/17/2007    5.4 (NOTE)   The ADA recommends the following therapeutic goals for glycemic   control related to Hgb A1C measurement:   Goal of Therapy:   < 7.0% Hgb A1C   Action Suggested:  > 8.0% Hgb A1C   Ref:  Diabetes Care, 22, Suppl. 1, 1999   Lab Results  Component Value Date   CHOL  05/22/2007    175        ATP III CLASSIFICATION:  <200     mg/dL   Desirable  200-239  mg/dL   Borderline High  >=240    mg/dL   High   HDL 37 (L) 05/22/2007   LDLCALC  05/22/2007    96        Total Cholesterol/HDL:CHD Risk Coronary Heart Disease Risk Table                     Men   Women  1/2 Average Risk   3.4   3.3   TRIG 208 (H) 05/22/2007   CHOLHDL 4.7 05/22/2007    Significant Diagnostic Results in last 30 days:  No results found.  Assessment/Plan  #1-cough with chest x-ray showing small right base infiltrate- she has been started on Augmentin which will run through September 14- she is also Robitussin every 6 hours for 5 days and then as needed.  At this point continue to monitor she appears to be stable   #2-small erythematous patch of base of left thumb at this point will monitor it does not appear to be edematous or warm or tender this may be just transitory apparently is looking better today  3 hypertension she is on Norvasc 7.5 mg a day- this point will monitor at times agitation will artificially elevate this=-- again getting her blood pressure reading today was somewhat difficult because  of her arm being contracted position and slight agitation- will monitor for now  Elevations do not appear to be consistent.  [id number]

## 2018-01-18 ENCOUNTER — Encounter: Payer: Self-pay | Admitting: Internal Medicine

## 2018-01-18 NOTE — Progress Notes (Signed)
Location:    [facility] Room Number: 110/W Place of Service:  SNF [phone]) Provider:  Freddi Starr, MD  Patient Care Team: [name], MD as PCP - General (Internal Medicine) [name], [name], MD as Consulting Physician (Gastroenterology)  Extended Emergency Contact Information Primary Emergency Contact: [name] Address: [address]          [place], [place] [address] [name] of [place] Phone: [phone] Work Phone: [phone] Mobile Phone: [phone] Relation: Daughter Secondary Emergency Contact: [name] Address: [address]          [place], [place] [address] [name] of [place] Phone: [phone] Mobile Phone: [phone] Relation: Relative  Code Status:  DNR Goals of care: Advanced Directive information Advanced Directives 01/18/2018  Does Patient Have a Medical Advance Directive? Yes  Type of Advance Directive Out of facility DNR (pink MOST or yellow form)  Does patient want to make changes to medical advance directive? No - Patient declined  Copy of [place] in Chart? No - copy requested  Would patient like information on creating a medical advance directive? No - Patient declined  Pre-existing out of facility DNR order (yellow form or pink MOST form) -     Chief Complaint  Patient presents with  . Acute Visit    Patient is being seen for Dysphagia    HPI:  Pt is a 82 y.o. female seen today for an acute visit for    Past Medical History:  Diagnosis Date  . Cancer ([facility])   . Dementia   . Gallstones   . Gastroparalysis   . HTN (hypertension)   . Infiltrating ductal carcinoma of right female breast ([facility]) 12/02/2009   Qualifier: History of  By: [name] CMA [name]), June    . Osteoporosis 11/18/2011  . Reflux   . Renal disorder    renal insufficiency.    Past Surgical History:  Procedure Laterality Date  . BREAST BIOPSY    . BREAST LUMPECTOMY    . COLONOSCOPY   2009   Dr. Fuller Plan: normal  . ESOPHAGOGASTRODUODENOSCOPY  12/11/2009   Dr. Fuller Plan: hiatal hernia  . ESOPHAGOGASTRODUODENOSCOPY N/A 01/29/2014   Procedure: ESOPHAGOGASTRODUODENOSCOPY (EGD);  Surgeon: [name], MD;  [facility];  Service: Endoscopy;  Laterality: N/A;  245  . TONSILLECTOMY AND ADENOIDECTOMY      Allergies  Allergen Reactions  . Aricept [Donepezil Hydrochloride] Nausea And Vomiting  . Exelon [Rivastigmine Tartrate]   . Lithium Nausea And Vomiting    Outpatient Encounter Medications as of 01/18/2018  Medication Sig  . acetaminophen (TYLENOL) 325 MG tablet Take two tablets by mouth twice a day, take 650 mg by mouth every 6 hours prn  . amLODipine (NORVASC) 2.5 MG tablet Take 2.5 mg by mouth along with 5 mg to = 7.5 mg once a day  . amLODipine (NORVASC) 5 MG tablet Take 5 mg by mouth along with 2.5 mg to = 7.5 mg once a day  . amoxicillin-clavulanate (AUGMENTIN) 500-125 MG tablet Take 1 tablet by mouth 2 (two) times daily.  [name] docusate (COLACE) 50 MG/5ML liquid Take 100 mg by mouth daily.  . metoCLOPramide (REGLAN) 5 MG tablet Take 5 mg by mouth 2 (two) times daily.   . Multiple Vitamins-Minerals (MULTIVITAMINS/MINERALS ADULT) LIQD Take 5 mLs by mouth daily.  . Olopatadine HCl 0.2 % SOLN Place 1 drop into both eyes daily as needed.  [name] omeprazole (PRILOSEC) 20 MG capsule Take  20 mg by mouth daily.  . ondansetron (ZOFRAN-ODT) 4 MG disintegrating tablet Dissolve 1 tablet in mouth 2 times daily before meals.  . polyethylene glycol (MIRALAX / GLYCOLAX) packet Take 17 g by mouth daily.    . Probiotic Product (RISA-BID PROBIOTIC PO) Take 1 tablet buy mouth twice a day  . senna-docusate (SENOKOT-S) 8.6-50 MG tablet Take 2 tablets by mouth at bedtime.   . [DISCONTINUED] Doxylamine-DM (ROBITUSSIN NIGHTTIME COUGH DM) 12.5-30 MG/10ML LIQD Take 15 mLs by mouth every 6 (six) hours.   No facility-administered encounter medications on file as of 01/18/2018.     Review of  Systems  Immunization History  Administered Date(s) Administered  . Influenza-Unspecified 02/13/2014, 02/09/2016, 02/10/2017  . Pneumococcal Conjugate-13 02/10/2017  . Pneumococcal Polysaccharide-23 02/29/2016  . Tdap 01/30/2017   Pertinent  Health Maintenance Due  Topic Date Due  . INFLUENZA VACCINE  02/14/2018 (Originally 12/07/2017)  . DEXA SCAN  Completed  . PNA vac Low Risk Adult  Completed   Fall Risk  01/26/2017  Falls in the past year? No   Functional Status Survey:    Vitals:   01/18/18 1539  BP: 136/87  Pulse: 84  Resp: 17  Temp: 98.9 F (37.2 C)  TempSrc: Oral  SpO2: 97%   There is no height or weight on file to calculate BMI. Physical Exam  Labs reviewed: Recent Labs    05/04/17 0900 05/10/17 0800 07/24/17 1911 08/10/17 0702  NA 143  --  136 143  K 4.2  --  3.9 4.0  CL 106  --  102 105  CO2 25  --  23 26  GLUCOSE 92  --  147* 87  BUN 61*  --  55* 43*  CREATININE 2.26*  --  1.98* 1.82*  CALCIUM 9.8  --  8.8* 9.5  MG  --  2.7*  --   --    Recent Labs    04/10/17 1200 04/12/17 0710 07/24/17 1911  AST 60* 30 23  ALT 68* 50 13*  ALKPHOS 87 72 79  BILITOT 0.5 0.7 0.5  PROT 8.4* 7.7 7.0  ALBUMIN 4.4 4.0 3.6   Recent Labs    04/12/17 0710 07/24/17 1911 08/10/17 0702  WBC 8.0 8.2 6.5  NEUTROABS 6.0 7.2 4.5  HGB 14.0 10.0* 12.1  HCT 46.2* 32.4* 38.8  37.2  MCV 95.3 92.6 90.4  PLT 215 129* 250   Lab Results  Component Value Date   TSH 0.823 04/10/2017   Lab Results  Component Value Date   HGBA1C  05/17/2007    5.4 (NOTE)   The ADA recommends the following therapeutic goals for glycemic   control related to Hgb A1C measurement:   Goal of Therapy:   < 7.0% Hgb A1C   Action Suggested:  > 8.0% Hgb A1C   Ref:  Diabetes Care, 22, Suppl. 1, 1999   Lab Results  Component Value Date   CHOL  05/22/2007    175        ATP III CLASSIFICATION:  <200     mg/dL   Desirable  200-239  mg/dL   Borderline High  >=240    mg/dL   High   HDL 37 (L)  05/22/2007   LDLCALC  05/22/2007    96        Total Cholesterol/HDL:CHD Risk Coronary Heart Disease Risk Table                     Men   Women  1/2 Average Risk   3.4   3.3   TRIG 208 (H) 05/22/2007   CHOLHDL 4.7 05/22/2007    Significant Diagnostic Results in last 30 days:  No results found.  Assessment/Plan There are no diagnoses linked to this encounter.      Oralia Manis, [place]

## 2018-01-19 NOTE — Progress Notes (Signed)
This encounter was created in error - please disregard.  This encounter was created in error - please disregard.

## 2018-01-29 DIAGNOSIS — B351 Tinea unguium: Secondary | ICD-10-CM | POA: Diagnosis not present

## 2018-01-29 DIAGNOSIS — L603 Nail dystrophy: Secondary | ICD-10-CM | POA: Diagnosis not present

## 2018-01-29 DIAGNOSIS — I739 Peripheral vascular disease, unspecified: Secondary | ICD-10-CM | POA: Diagnosis not present

## 2018-02-06 ENCOUNTER — Encounter: Payer: Self-pay | Admitting: Internal Medicine

## 2018-02-06 ENCOUNTER — Non-Acute Institutional Stay (SKILLED_NURSING_FACILITY): Payer: Medicare Other | Admitting: Internal Medicine

## 2018-02-06 ENCOUNTER — Non-Acute Institutional Stay (SKILLED_NURSING_FACILITY): Payer: Medicare Other

## 2018-02-06 DIAGNOSIS — G300 Alzheimer's disease with early onset: Secondary | ICD-10-CM | POA: Diagnosis not present

## 2018-02-06 DIAGNOSIS — K219 Gastro-esophageal reflux disease without esophagitis: Secondary | ICD-10-CM

## 2018-02-06 DIAGNOSIS — I10 Essential (primary) hypertension: Secondary | ICD-10-CM

## 2018-02-06 DIAGNOSIS — R1314 Dysphagia, pharyngoesophageal phase: Secondary | ICD-10-CM

## 2018-02-06 DIAGNOSIS — F0281 Dementia in other diseases classified elsewhere with behavioral disturbance: Secondary | ICD-10-CM | POA: Diagnosis not present

## 2018-02-06 DIAGNOSIS — Z0000 Encounter for general adult medical examination without abnormal findings: Secondary | ICD-10-CM

## 2018-02-06 DIAGNOSIS — K3184 Gastroparesis: Secondary | ICD-10-CM

## 2018-02-06 DIAGNOSIS — F02818 Dementia in other diseases classified elsewhere, unspecified severity, with other behavioral disturbance: Secondary | ICD-10-CM

## 2018-02-06 NOTE — Progress Notes (Signed)
Subjective:   [name] is a 82 y.o. female who presents for Medicare Annual (Subsequent) preventive examination at [facility]; incapacitated patient unable to answer questions appropriately   Last AWV-01/26/2017    Objective:     Vitals: BP 138/86 (BP Location: Left Arm, Patient Position: Sitting)   Pulse 60   Temp 98.4 F (36.9 C) (Oral)   Ht 5\' 7"  (1.702 m)   Wt 157 lb (71.2 kg)   BMI 24.59 kg/m   Body mass index is 24.59 kg/m.  Advanced Directives 02/06/2018 02/06/2018 01/18/2018 01/15/2018 12/18/2017 12/12/2017 11/29/2017  Does Patient Have a Medical Advance Directive? Yes Yes Yes Yes Yes Yes Yes  Type of Advance Directive Out of facility DNR (pink MOST or yellow form) Out of facility DNR (pink MOST or yellow form) Out of facility DNR (pink MOST or yellow form) Out of facility DNR (pink MOST or yellow form) Out of facility DNR (pink MOST or yellow form) Out of facility DNR (pink MOST or yellow form) Out of facility DNR (pink MOST or yellow form)  Does patient want to make changes to medical advance directive? No - Patient declined No - Patient declined No - Patient declined No - Patient declined - No - Patient declined No - Patient declined  Copy of [place] in Chart? No - copy requested No - copy requested No - copy requested No - copy requested No - copy requested No - copy requested No - copy requested  Would patient like information on creating a medical advance directive? No - Patient declined No - Patient declined No - Patient declined No - Patient declined No - Patient declined No - Patient declined No - Patient declined  Pre-existing out of facility DNR order (yellow form or pink MOST form) Yellow form placed in chart (order not valid for inpatient use);Pink MOST form placed in chart (order not valid for inpatient use) - - - - - -    Tobacco Social History   Tobacco Use  Smoking Status Never Smoker  Smokeless Tobacco Never Used       Counseling given: Not Answered   Clinical Intake:  Pre-visit preparation completed: No  Pain : Faces Faces Pain Scale: No hurt  Faces Pain Scale: No hurt  Diabetes: No  How often do you need to have someone help you when you read instructions, pamphlets, or other written materials from your doctor or pharmacy?: 5 - Always(dementia)  Interpreter Needed?: No  Information entered by :: Tyson Dense, RN  Past Medical History:  Diagnosis Date  . Cancer ([place])   . Dementia ([place])   . Gallstones   . Gastroparalysis   . HTN (hypertension)   . Infiltrating ductal carcinoma of right female breast ([place]) 12/02/2009   Qualifier: History of  By: [name] CMA [name]), June    . Osteoporosis 11/18/2011  . Reflux   . Renal disorder    renal insufficiency.    Past Surgical History:  Procedure Laterality Date  . BREAST BIOPSY    . BREAST LUMPECTOMY    . COLONOSCOPY  2009   Dr. Fuller Plan: normal  . ESOPHAGOGASTRODUODENOSCOPY  12/11/2009   Dr. Fuller Plan: hiatal hernia  . ESOPHAGOGASTRODUODENOSCOPY N/A 01/29/2014   Procedure: ESOPHAGOGASTRODUODENOSCOPY (EGD);  Surgeon: [name], MD;  [facility];  Service: Endoscopy;  Laterality: N/A;  245  . TONSILLECTOMY AND ADENOIDECTOMY     Family History  Problem Relation Age of Onset  .  Cancer Sister        breast  . Cancer Unknown   . Colon cancer Neg Hx    Social History   Socioeconomic History  . Marital status: Divorced    Spouse name: Not on file  . Number of children: 2  . Years of education: college  . Highest education level: Not on file  Occupational History  . Occupation: retired    Fish farm manager: RETIRED  Social Needs  . Financial resource strain: Not on file  . Food insecurity:    Worry: Not on file    Inability: Not on file  . Transportation needs:    Medical: Not on file    Non-medical: Not on file  Tobacco Use  . Smoking status: Never Smoker  . Smokeless tobacco: Never Used  Substance and Sexual Activity   . Alcohol use: No  . Drug use: No  . Sexual activity: Not on file  Lifestyle  . Physical activity:    Days per week: Not on file    Minutes per session: Not on file  . Stress: Not on file  Relationships  . Social connections:    Talks on phone: Not on file    Gets together: Not on file    Attends religious service: Not on file    Active member of club or organization: Not on file    Attends meetings of clubs or organizations: Not on file    Relationship status: Not on file  Other Topics Concern  . Not on file  Social History Narrative  . Not on file    Outpatient Encounter Medications as of 02/06/2018  Medication Sig  . acetaminophen (TYLENOL) 325 MG tablet Take two tablets by mouth twice a day, take 650 mg by mouth every 6 hours prn  . amLODipine (NORVASC) 2.5 MG tablet Take 2.5 mg by mouth along with 5 mg to = 7.5 mg once a day  . amLODipine (NORVASC) 5 MG tablet Take 5 mg by mouth along with 2.5 mg to = 7.5 mg once a day  . docusate (COLACE) 50 MG/5ML liquid Take 100 mg by mouth daily.  . metoCLOPramide (REGLAN) 5 MG/5ML solution Give patient 2.5 ml by mouth twice a day  . Multiple Vitamins-Minerals (MULTIVITAMINS/MINERALS ADULT) LIQD Take 5 mLs by mouth daily.  . Olopatadine HCl 0.2 % SOLN Place 1 drop into both eyes daily as needed.  [name] omeprazole (PRILOSEC) 20 MG capsule Take 20 mg by mouth daily.  . ondansetron (ZOFRAN) 4 MG/5ML solution Give 2.5 ml by mouth twice a day  . polyethylene glycol (MIRALAX / GLYCOLAX) packet Take 17 g by mouth daily.    [name] senna-docusate (SENOKOT-S) 8.6-50 MG tablet Take 2 tablets by mouth at bedtime.   . [DISCONTINUED] ondansetron (ZOFRAN-ODT) 4 MG disintegrating tablet Dissolve 1 tablet in mouth 2 times daily before meals.  . [DISCONTINUED] Probiotic Product (RISA-BID PROBIOTIC PO) Take 1 tablet buy mouth twice a day   No facility-administered encounter medications on file as of 02/06/2018.     Activities of Daily Living In your present  state of health, do you have any difficulty performing the following activities: 02/06/2018  Hearing? (No Data)  Comment unable to determine due to dementia  Vision? (No Data)  Comment unable to determine due to dementia  Difficulty concentrating or making decisions? Y  Walking or climbing stairs? Y  Dressing or bathing? Y  Doing errands, shopping? Y  Preparing Food and eating ? Y  Using the Toilet?  Y  In the past six months, have you accidently leaked urine? Y  Do you have problems with loss of bowel control? Y  Managing your Medications? Y  Managing your Finances? Y  Housekeeping or managing your Housekeeping? Y  Some recent data might be hidden    Patient Care Team: [name], MD as PCP - General (Internal Medicine) [name] [name], MD as Consulting Physician (Gastroenterology)    Assessment:   This is a routine wellness examination for [place].  Exercise Activities and Dietary recommendations Current Exercise Habits: The patient does not participate in regular exercise at present, Exercise limited by: neurologic condition(s)  Goals   None     Fall Risk Fall Risk  02/06/2018 01/26/2017  Falls in the past year? No No   Is the patient's home free of loose throw rugs in walkways, pet beds, electrical cords, etc?   yes      Grab bars in the bathroom? yes      Handrails on the stairs?   yes      Adequate lighting?   yes   Depression Screen PHQ 2/9 Scores 02/06/2018 01/26/2017  Exception Documentation Medical reason Medical reason     Cognitive Function MMSE - Mini Mental State Exam 02/06/2018 01/26/2017  Not completed: Unable to complete Unable to complete        Immunization History  Administered Date(s) Administered  . Influenza-Unspecified 02/13/2014, 02/09/2016, 02/10/2017  . Pneumococcal Conjugate-13 02/10/2017  . Pneumococcal Polysaccharide-23 02/29/2016  . Tdap 01/30/2017    Qualifies for Shingles Vaccine? Not in past records  Screening  Tests Health Maintenance  Topic Date Due  . INFLUENZA VACCINE  02/14/2018 (Originally 12/07/2017)  . TETANUS/TDAP  01/31/2027  . DEXA SCAN  Completed  . PNA vac Low Risk Adult  Completed    Cancer Screenings: Lung: Low Dose CT Chest recommended if Age 72-80 years, 30 pack-year currently smoking OR have quit w/in 15years. Patient does not qualify. Breast:  Up to date on Mammogram? Yes   Up to date of Bone Density/Dexa? Yes Colorectal: up to date  Additional Screenings:  Hepatitis C Screening: unable to accept or decline Flu vaccine due: will receive at [facility]     Plan:  I have personally reviewed and addressed the Medicare Annual Wellness questionnaire and have noted the following in the patient's chart:  A. Medical and social history B. Use of alcohol, tobacco or illicit drugs  C. Current medications and supplements D. Functional ability and status E.  Nutritional status F.  Physical activity G. Advance directives H. List of other physicians I.  Hospitalizations, surgeries, and ER visits in previous 12 months J.  [place] to include hearing, vision, cognitive, depression L. Referrals and appointments - none  In addition, I am unable to review and discuss with incapacitated patient certain preventive protocols, quality metrics, and best practice recommendations. A written personalized care plan for preventive services as well as general preventive health recommendations were provided to patient.   See attached scanned questionnaire for additional information.   Signed,   Tyson Dense, RN Nurse Health Advisor

## 2018-02-06 NOTE — Progress Notes (Signed)
Location:    [facility] Room Number: 110/W Place of Service:  SNF [phone]) Provider: Veleta Miners MD  [name], MD  Patient Care Team: [name], MD as PCP - General (Internal Medicine) [name], [name], MD as Consulting Physician (Gastroenterology)  Extended Emergency Contact Information Primary Emergency Contact: [facility] Address: [address]          [place], [place] [address] [name] of [place] Phone: [phone] Work Phone: [phone] Mobile Phone: [phone] Relation: Daughter Secondary Emergency Contact: [name] Address: [address]          [place], [place] [address] [name] of [place] Phone: [phone] Mobile Phone: [phone] Relation: Relative  Code Status:  DNR Goals of care: Advanced Directive information Advanced Directives 02/06/2018  Does Patient Have a Medical Advance Directive? Yes  Type of Advance Directive Out of facility DNR (pink MOST or yellow form)  Does patient want to make changes to medical advance directive? No - Patient declined  Copy of [place] in Chart? No - copy requested  Would patient like information on creating a medical advance directive? No - Patient declined  Pre-existing out of facility DNR order (yellow form or pink MOST form) Yellow form placed in chart (order not valid for inpatient use);Pink MOST form placed in chart (order not valid for inpatient use)     Chief Complaint  Patient presents with  . Medical Management of Chronic Issues    Patient si being seen for routine visit of medical management     HPI:  Pt is a 82 y.o. female seen today for medical management of chronic diseases.   Patient has h/o hypertension, Advanced Dementia, Chronic renal disease, Breast cancer s/p Mastectomy, GERD and Gastroparesis. Patient is long term Resident of facility. Patient recently has been sleepy more then usual. We had reduced her Zofran and Reglan  doses. But no change has been noticed.She also was recently treated for Pneumonia with Cough and Infiltrate on Chest Xray. She does not have any fever. No Episodes of vomiting. Her weight is still stable at 157.6. She does not eat anything solid.only on Liquids. Patient is unable to give any history. She does look sleepy. No Other Issues per Nursing.   Past Medical History:  Diagnosis Date  . Cancer ([place])   . Dementia ([place])   . Gallstones   . Gastroparalysis   . HTN (hypertension)   . Infiltrating ductal carcinoma of right female breast ([place]) 12/02/2009   Qualifier: History of  By: [name] CMA [name]), June    . Osteoporosis 11/18/2011  . Reflux   . Renal disorder    renal insufficiency.    Past Surgical History:  Procedure Laterality Date  . BREAST BIOPSY    . BREAST LUMPECTOMY    . COLONOSCOPY  2009   Dr. Fuller Plan: normal  . ESOPHAGOGASTRODUODENOSCOPY  12/11/2009   Dr. Fuller Plan: hiatal hernia  . ESOPHAGOGASTRODUODENOSCOPY N/A 01/29/2014   Procedure: ESOPHAGOGASTRODUODENOSCOPY (EGD);  Surgeon: [name], MD;  [facility];  Service: Endoscopy;  Laterality: N/A;  245  . TONSILLECTOMY AND ADENOIDECTOMY      Allergies  Allergen Reactions  . Aricept [Donepezil Hydrochloride] Nausea And Vomiting  . Exelon [Rivastigmine Tartrate]   . Lithium Nausea And Vomiting    Outpatient Encounter Medications as of 02/06/2018  Medication Sig  . acetaminophen (TYLENOL) 325 MG tablet Take two tablets by mouth twice a day, take 650 mg by mouth every 6  hours prn  . amLODipine (NORVASC) 2.5 MG tablet Take 2.5 mg by mouth along with 5 mg to = 7.5 mg once a day  . amLODipine (NORVASC) 5 MG tablet Take 5 mg by mouth along with 2.5 mg to = 7.5 mg once a day  . docusate (COLACE) 50 MG/5ML liquid Take 100 mg by mouth daily.  . metoCLOPramide (REGLAN) 5 MG/5ML solution Give patient 2.5 ml by mouth twice a day  . Multiple Vitamins-Minerals (MULTIVITAMINS/MINERALS ADULT) LIQD Take 5 mLs by  mouth daily.  . Olopatadine HCl 0.2 % SOLN Place 1 drop into both eyes daily as needed.  [name] omeprazole (PRILOSEC) 20 MG capsule Take 20 mg by mouth daily.  . ondansetron (ZOFRAN) 4 MG/5ML solution Give 2.5 ml by mouth twice a day  . polyethylene glycol (MIRALAX / GLYCOLAX) packet Take 17 g by mouth daily.    [name] senna-docusate (SENOKOT-S) 8.6-50 MG tablet Take 2 tablets by mouth at bedtime.   . [DISCONTINUED] amoxicillin-clavulanate (AUGMENTIN) 500-125 MG tablet Take 1 tablet by mouth 2 (two) times daily.  . [DISCONTINUED] metoCLOPramide (REGLAN) 5 MG tablet Take 5 mg by mouth 2 (two) times daily.   . [DISCONTINUED] ondansetron (ZOFRAN-ODT) 4 MG disintegrating tablet Dissolve 1 tablet in mouth 2 times daily before meals.  . [DISCONTINUED] Probiotic Product (RISA-BID PROBIOTIC PO) Take 1 tablet buy mouth twice a day   No facility-administered encounter medications on file as of 02/06/2018.      Review of Systems  Unable to perform ROS: Dementia    Immunization History  Administered Date(s) Administered  . Influenza-Unspecified 02/13/2014, 02/09/2016, 02/10/2017  . Pneumococcal Conjugate-13 02/10/2017  . Pneumococcal Polysaccharide-23 02/29/2016  . Tdap 01/30/2017   Pertinent  Health Maintenance Due  Topic Date Due  . INFLUENZA VACCINE  02/14/2018 (Originally 12/07/2017)  . DEXA SCAN  Completed  . PNA vac Low Risk Adult  Completed   Fall Risk  02/06/2018 01/26/2017  Falls in the past year? No No   Functional Status Survey:    Vitals:   02/06/18 1132  BP: (!) 148/62  Pulse: (!) 52  Resp: 20  Temp: 98.8 F (37.1 C)  TempSrc: Oral  SpO2: 95%  Weight: 157 lb 9.6 oz (71.5 kg)  Height: 5\' 7"  (1.702 m)   Body mass index is 24.68 kg/m. Physical Exam  Constitutional: She appears well-developed.  HENT:  Head: Normocephalic.  Mouth/Throat: Oropharynx is clear and moist.  Eyes: Pupils are equal, round, and reactive to light.  Neck: Neck supple.  Cardiovascular: Normal rate and  regular rhythm.  Pulmonary/Chest: Effort normal and breath sounds normal. No stridor. No respiratory distress.  Abdominal: Soft. Bowel sounds are normal. She exhibits no distension. There is no tenderness. There is no guarding.  Musculoskeletal: She exhibits no edema.  Neurological: She is alert.  Does not follow any Commands.And is non Verbal. She is wheelchair bound and is not Ambulatory.  Skin: Skin is warm and dry.  Psychiatric: She has a normal mood and affect. Her behavior is normal.    Labs reviewed: Recent Labs    05/04/17 0900 05/10/17 0800 07/24/17 1911 08/10/17 0702  NA 143  --  136 143  K 4.2  --  3.9 4.0  CL 106  --  102 105  CO2 25  --  23 26  GLUCOSE 92  --  147* 87  BUN 61*  --  55* 43*  CREATININE 2.26*  --  1.98* 1.82*  CALCIUM 9.8  --  8.8* 9.5  MG  --  2.7*  --   --    Recent Labs    04/10/17 1200 04/12/17 0710 07/24/17 1911  AST 60* 30 23  ALT 68* 50 13*  ALKPHOS 87 72 79  BILITOT 0.5 0.7 0.5  PROT 8.4* 7.7 7.0  ALBUMIN 4.4 4.0 3.6   Recent Labs    04/12/17 0710 07/24/17 1911 08/10/17 0702  WBC 8.0 8.2 6.5  NEUTROABS 6.0 7.2 4.5  HGB 14.0 10.0* 12.1  HCT 46.2* 32.4* 38.8  37.2  MCV 95.3 92.6 90.4  PLT 215 129* 250   Lab Results  Component Value Date   TSH 0.823 04/10/2017   Lab Results  Component Value Date   HGBA1C  05/17/2007    5.4 (NOTE)   The ADA recommends the following therapeutic goals for glycemic   control related to Hgb A1C measurement:   Goal of Therapy:   < 7.0% Hgb A1C   Action Suggested:  > 8.0% Hgb A1C   Ref:  Diabetes Care, 22, Suppl. 1, 1999   Lab Results  Component Value Date   CHOL  05/22/2007    175        ATP III CLASSIFICATION:  <200     mg/dL   Desirable  200-239  mg/dL   Borderline High  >=240    mg/dL   High   HDL 37 (L) 05/22/2007   LDLCALC  05/22/2007    96        Total Cholesterol/HDL:CHD Risk Coronary Heart Disease Risk Table                     Men   Women  1/2 Average Risk   3.4   3.3    TRIG 208 (H) 05/22/2007   CHOLHDL 4.7 05/22/2007    Significant Diagnostic Results in last 30 days:  No results found.  Assessment/Plan  Essential hypertension Blood pressure stable on Norvasc 7.5 mg  Dysphagia and gastroparesis Patient is on very low dose of Reglan BID, Zofran BID and Prilosec She only takes liquid and does not eat With help of her daughter and nurses her weight has been stable Recent Lethargy D/W daughter and Nurses will Do Some labs on Patient. This can be related to worsening Dementia Alzheimer's dementia Continues to be full Care.  She is comfort care No More Aggressive Treatment Patient will get Flu shot Renal insufficiency Worsening of her renal function  no more work-up but will repeat Labs today   Family/ staff Communication:   Labs/tests ordered:    Total time spent in this patient care encounter was 25_ minutes; greater than 50% of the visit spent counseling patient, reviewing records , Labs and coordinating care for problems addressed at this encounter.

## 2018-02-06 NOTE — Patient Instructions (Signed)
[name] , Thank you for taking time to come for your Medicare Wellness Visit. I appreciate your ongoing commitment to your health goals. Please review the following plan we discussed and let me know if I can assist you in the future.   Screening recommendations/referrals: Colonoscopy excluded, over age 80 Mammogram excluded, over age 77 Bone Density up to date Recommended yearly ophthalmology/optometry visit for glaucoma screening and checkup Recommended yearly dental visit for hygiene and checkup  Vaccinations: Influenza vaccine due, will receive at [facility] Pneumococcal vaccine up to date, completed Tdap vaccine up to date, due 01/31/2027 Shingles vaccine not in past records    Advanced directives: in chart  Conditions/risks identified: none  Next appointment: Dr. Lyndel Safe makes rounds   Preventive Care 65 Years and Older, Female Preventive care refers to lifestyle choices and visits with your health care provider that can promote health and wellness. What does preventive care include?  A yearly physical exam. This is also called an annual well check.  Dental exams once or twice a year.  Routine eye exams. Ask your health care provider how often you should have your eyes checked.  Personal lifestyle choices, including:  Daily care of your teeth and gums.  Regular physical activity.  Eating a healthy diet.  Avoiding tobacco and drug use.  Limiting alcohol use.  Practicing safe sex.  Taking low-dose aspirin every day.  Taking vitamin and mineral supplements as recommended by your health care provider. What happens during an annual well check? The services and screenings done by your health care provider during your annual well check will depend on your age, overall health, lifestyle risk factors, and family history of disease. Counseling  Your health care provider may ask you questions about your:  Alcohol use.  Tobacco use.  Drug use.  Emotional  well-being.  Home and relationship well-being.  Sexual activity.  Eating habits.  History of falls.  Memory and ability to understand (cognition).  Work and work Statistician.  Reproductive health. Screening  You may have the following tests or measurements:  Height, weight, and BMI.  Blood pressure.  Lipid and cholesterol levels. These may be checked every 5 years, or more frequently if you are over 61 years old.  Skin check.  Lung cancer screening. You may have this screening every year starting at age 64 if you have a 30-pack-year history of smoking and currently smoke or have quit within the past 15 years.  Fecal occult blood test (FOBT) of the stool. You may have this test every year starting at age 59.  Flexible sigmoidoscopy or colonoscopy. You may have a sigmoidoscopy every 5 years or a colonoscopy every 10 years starting at age 11.  Hepatitis C blood test.  Hepatitis B blood test.  Sexually transmitted disease (STD) testing.  Diabetes screening. This is done by checking your blood sugar (glucose) after you have not eaten for a while (fasting). You may have this done every 1-3 years.  Bone density scan. This is done to screen for osteoporosis. You may have this done starting at age 49.  Mammogram. This may be done every 1-2 years. Talk to your health care provider about how often you should have regular mammograms. Talk with your health care provider about your test results, treatment options, and if necessary, the need for more tests. Vaccines  Your health care provider may recommend certain vaccines, such as:  Influenza vaccine. This is recommended every year.  Tetanus, diphtheria, and acellular pertussis (Tdap, Td) vaccine.  You may need a Td booster every 10 years.  Zoster vaccine. You may need this after age 22.  Pneumococcal 13-valent conjugate (PCV13) vaccine. One dose is recommended after age 76.  Pneumococcal polysaccharide (PPSV23) vaccine. One  dose is recommended after age 69. Talk to your health care provider about which screenings and vaccines you need and how often you need them. This information is not intended to replace advice given to you by your health care provider. Make sure you discuss any questions you have with your health care provider. Document Released: 05/22/2015 Document Revised: 01/13/2016 Document Reviewed: 02/24/2015 Elsevier Interactive Patient Education  2017 [place] Prevention in the Home Falls can cause injuries. They can happen to people of all ages. There are many things you can do to make your home safe and to help prevent falls. What can I do on the outside of my home?  Regularly fix the edges of walkways and driveways and fix any cracks.  Remove anything that might make you trip as you walk through a door, such as a raised step or threshold.  Trim any bushes or trees on the path to your home.  Use bright outdoor lighting.  Clear any walking paths of anything that might make someone trip, such as rocks or tools.  Regularly check to see if handrails are loose or broken. Make sure that both sides of any steps have handrails.  Any raised decks and porches should have guardrails on the edges.  Have any leaves, snow, or ice cleared regularly.  Use sand or salt on walking paths during winter.  Clean up any spills in your garage right away. This includes oil or grease spills. What can I do in the bathroom?  Use night lights.  Install grab bars by the toilet and in the tub and shower. Do not use towel bars as grab bars.  Use non-skid mats or decals in the tub or shower.  If you need to sit down in the shower, use a plastic, non-slip stool.  Keep the floor dry. Clean up any water that spills on the floor as soon as it happens.  Remove soap buildup in the tub or shower regularly.  Attach bath mats securely with double-sided non-slip rug tape.  Do not have throw rugs and other  things on the floor that can make you trip. What can I do in the bedroom?  Use night lights.  Make sure that you have a light by your bed that is easy to reach.  Do not use any sheets or blankets that are too big for your bed. They should not hang down onto the floor.  Have a firm chair that has side arms. You can use this for support while you get dressed.  Do not have throw rugs and other things on the floor that can make you trip. What can I do in the kitchen?  Clean up any spills right away.  Avoid walking on wet floors.  Keep items that you use a lot in easy-to-reach places.  If you need to reach something above you, use a strong step stool that has a grab bar.  Keep electrical cords out of the way.  Do not use floor polish or wax that makes floors slippery. If you must use wax, use non-skid floor wax.  Do not have throw rugs and other things on the floor that can make you trip. What can I do with my stairs?  Do not leave any  items on the stairs.  Make sure that there are handrails on both sides of the stairs and use them. Fix handrails that are broken or loose. Make sure that handrails are as long as the stairways.  Check any carpeting to make sure that it is firmly attached to the stairs. Fix any carpet that is loose or worn.  Avoid having throw rugs at the top or bottom of the stairs. If you do have throw rugs, attach them to the floor with carpet tape.  Make sure that you have a light switch at the top of the stairs and the bottom of the stairs. If you do not have them, ask someone to add them for you. What else can I do to help prevent falls?  Wear shoes that:  Do not have high heels.  Have rubber bottoms.  Are comfortable and fit you well.  Are closed at the toe. Do not wear sandals.  If you use a stepladder:  Make sure that it is fully opened. Do not climb a closed stepladder.  Make sure that both sides of the stepladder are locked into place.  Ask  someone to hold it for you, if possible.  Clearly mark and make sure that you can see:  Any grab bars or handrails.  First and last steps.  Where the edge of each step is.  Use tools that help you move around (mobility aids) if they are needed. These include:  Canes.  Walkers.  Scooters.  Crutches.  Turn on the lights when you go into a dark area. Replace any light bulbs as soon as they burn out.  Set up your furniture so you have a clear path. Avoid moving your furniture around.  If any of your floors are uneven, fix them.  If there are any pets around you, be aware of where they are.  Review your medicines with your doctor. Some medicines can make you feel dizzy. This can increase your chance of falling. Ask your doctor what other things that you can do to help prevent falls. This information is not intended to replace advice given to you by your health care provider. Make sure you discuss any questions you have with your health care provider. Document Released: 02/19/2009 Document Revised: 10/01/2015 Document Reviewed: 05/30/2014 Elsevier Interactive Patient Education  2017 [employer].

## 2018-02-07 ENCOUNTER — Other Ambulatory Visit (HOSPITAL_COMMUNITY)
Admission: RE | Admit: 2018-02-07 | Discharge: 2018-02-07 | Disposition: A | Discharge status: Home / Self Care | Payer: Medicare Other | Source: Skilled Nursing Facility | Attending: Internal Medicine | Admitting: Internal Medicine

## 2018-02-07 DIAGNOSIS — I10 Essential (primary) hypertension: Secondary | ICD-10-CM | POA: Diagnosis not present

## 2018-02-07 LAB — COMPREHENSIVE METABOLIC PANEL
ALK PHOS: 69 U/L (ref 38–126)
ALT: 23 U/L (ref 0–44)
ANION GAP: 6 (ref 5–15)
AST: 20 U/L (ref 15–41)
Albumin: 3.5 g/dL (ref 3.5–5.0)
BILIRUBIN TOTAL: 0.5 mg/dL (ref 0.3–1.2)
BUN: 48 mg/dL — ABNORMAL HIGH (ref 8–23)
CALCIUM: 9.3 mg/dL (ref 8.9–10.3)
CO2: 29 mmol/L (ref 22–32)
Chloride: 107 mmol/L (ref 98–111)
Creatinine, Ser: 1.81 mg/dL — ABNORMAL HIGH (ref 0.44–1.00)
GFR calc Af Amer: 29 mL/min — ABNORMAL LOW (ref >60)
GFR, EST NON AFRICAN AMERICAN: 25 mL/min — AB (ref >60)
GLUCOSE: 82 mg/dL (ref 70–99)
POTASSIUM: 3.4 mmol/L — AB (ref 3.5–5.1)
Sodium: 142 mmol/L (ref 135–145)
TOTAL PROTEIN: 6.8 g/dL (ref 6.5–8.1)

## 2018-02-07 LAB — CBC WITH DIFFERENTIAL/PLATELET
BASOS PCT: 0 %
Basophils Absolute: 0 10*3/uL (ref 0.0–0.1)
Eosinophils Absolute: 0.2 10*3/uL (ref 0.0–0.7)
Eosinophils Relative: 4 %
HEMATOCRIT: 38.7 % (ref 36.0–46.0)
HEMOGLOBIN: 12.4 g/dL (ref 12.0–15.0)
Lymphocytes Relative: 23 %
Lymphs Abs: 1.2 10*3/uL (ref 0.7–4.0)
MCH: 29.4 pg (ref 26.0–34.0)
MCHC: 32 g/dL (ref 30.0–36.0)
MCV: 91.7 fL (ref 78.0–100.0)
MONO ABS: 0.6 10*3/uL (ref 0.1–1.0)
Monocytes Relative: 12 %
NEUTROS ABS: 3.3 10*3/uL (ref 1.7–7.7)
NEUTROS PCT: 61 %
Platelets: 211 10*3/uL (ref 150–400)
RBC: 4.22 MIL/uL (ref 3.87–5.11)
RDW: 13.9 % (ref 11.5–15.5)
WBC: 5.3 10*3/uL (ref 4.0–10.5)

## 2018-02-09 ENCOUNTER — Encounter (HOSPITAL_COMMUNITY)
Admission: RE | Admit: 2018-02-09 | Discharge: 2018-02-09 | Disposition: A | Discharge status: Home / Self Care | Payer: Medicare Other | Source: Skilled Nursing Facility | Attending: Internal Medicine | Admitting: Internal Medicine

## 2018-02-09 DIAGNOSIS — R293 Abnormal posture: Secondary | ICD-10-CM | POA: Insufficient documentation

## 2018-02-09 DIAGNOSIS — M6281 Muscle weakness (generalized): Secondary | ICD-10-CM | POA: Insufficient documentation

## 2018-02-09 DIAGNOSIS — K219 Gastro-esophageal reflux disease without esophagitis: Secondary | ICD-10-CM | POA: Insufficient documentation

## 2018-02-09 DIAGNOSIS — L03113 Cellulitis of right upper limb: Secondary | ICD-10-CM | POA: Insufficient documentation

## 2018-02-09 DIAGNOSIS — F319 Bipolar disorder, unspecified: Secondary | ICD-10-CM | POA: Insufficient documentation

## 2018-02-09 DIAGNOSIS — I10 Essential (primary) hypertension: Secondary | ICD-10-CM | POA: Insufficient documentation

## 2018-02-14 ENCOUNTER — Encounter (HOSPITAL_COMMUNITY)
Admission: RE | Admit: 2018-02-14 | Discharge: 2018-02-14 | Disposition: A | Discharge status: Home / Self Care | Payer: Medicare Other | Source: Skilled Nursing Facility | Attending: Internal Medicine | Admitting: Internal Medicine

## 2018-02-14 DIAGNOSIS — L03113 Cellulitis of right upper limb: Secondary | ICD-10-CM | POA: Insufficient documentation

## 2018-02-14 DIAGNOSIS — M6281 Muscle weakness (generalized): Secondary | ICD-10-CM | POA: Insufficient documentation

## 2018-02-14 DIAGNOSIS — R293 Abnormal posture: Secondary | ICD-10-CM | POA: Diagnosis not present

## 2018-02-14 DIAGNOSIS — F319 Bipolar disorder, unspecified: Secondary | ICD-10-CM | POA: Insufficient documentation

## 2018-02-14 DIAGNOSIS — K219 Gastro-esophageal reflux disease without esophagitis: Secondary | ICD-10-CM | POA: Insufficient documentation

## 2018-02-14 DIAGNOSIS — I10 Essential (primary) hypertension: Secondary | ICD-10-CM | POA: Diagnosis not present

## 2018-02-14 LAB — BASIC METABOLIC PANEL
ANION GAP: 9 (ref 5–15)
BUN: 48 mg/dL — AB (ref 8–23)
CALCIUM: 9.7 mg/dL (ref 8.9–10.3)
CO2: 25 mmol/L (ref 22–32)
Chloride: 112 mmol/L — ABNORMAL HIGH (ref 98–111)
Creatinine, Ser: 1.71 mg/dL — ABNORMAL HIGH (ref 0.44–1.00)
GFR calc Af Amer: 31 mL/min — ABNORMAL LOW (ref >60)
GFR, EST NON AFRICAN AMERICAN: 27 mL/min — AB (ref >60)
GLUCOSE: 93 mg/dL (ref 70–99)
Potassium: 3.4 mmol/L — ABNORMAL LOW (ref 3.5–5.1)
SODIUM: 146 mmol/L — AB (ref 135–145)

## 2018-02-16 ENCOUNTER — Encounter: Payer: Self-pay | Admitting: Internal Medicine

## 2018-02-16 ENCOUNTER — Non-Acute Institutional Stay (SKILLED_NURSING_FACILITY): Payer: Medicare Other | Admitting: Internal Medicine

## 2018-02-16 DIAGNOSIS — E870 Hyperosmolality and hypernatremia: Secondary | ICD-10-CM | POA: Diagnosis not present

## 2018-02-16 DIAGNOSIS — R1314 Dysphagia, pharyngoesophageal phase: Secondary | ICD-10-CM | POA: Diagnosis not present

## 2018-02-16 DIAGNOSIS — L03114 Cellulitis of left upper limb: Secondary | ICD-10-CM | POA: Diagnosis not present

## 2018-02-16 DIAGNOSIS — E876 Hypokalemia: Secondary | ICD-10-CM | POA: Diagnosis not present

## 2018-02-16 NOTE — Progress Notes (Signed)
Location:    [facility] Room Number: 110/W Place of Service:  SNF [phone]) Provider:  Freddi Starr, MD  Patient Care Team: [name], MD as PCP - General (Internal Medicine) [name], [name], MD as Consulting Physician (Gastroenterology)  Extended Emergency Contact Information Primary Emergency Contact: [name] Address: [address]          [place], [place] [address] [name] of [place] Phone: [phone] Work Phone: [phone] Mobile Phone: [phone] Relation: Daughter Secondary Emergency Contact: [name] Address: [address]          [place], [place] [address] [name] of [place] Phone: [phone] Mobile Phone: [phone] Relation: Relative  Code Status:  DNR Goals of care: Advanced Directive information Advanced Directives 02/16/2018  Does Patient Have a Medical Advance Directive? Yes  Type of Advance Directive Out of facility DNR (pink MOST or yellow form)  Does patient want to make changes to medical advance directive? No - Patient declined  Copy of [place] in Chart? No - copy requested  Would patient like information on creating a medical advance directive? No - Patient declined  Pre-existing out of facility DNR order (yellow form or pink MOST form) -     Chief Complaint  Patient presents with  . Acute Visit    Patient is being seen for skin tesr of left forearm    HPI:  Pt is a 82 y.o. female seen today for an acute visit for skin tear on her left lower arm-now appears to have some erythema and warmth.  Patient is a long-term resident of facility with numerous diagnoses including advanced dementia as well as hypertension chronic renal disease breast cancer status post mastectomy as well as gastroparesis and GERD.  She is under largely comfort care measures and needs full care-.  She does at times developed skin tears secondary to very fragile skin and appears to  have developed another skin tear on her left lower arm-this has responded well at times to doxycycline when there is evidence of cellulitis  She is a poor historian secondary to dementia and cannot give any review of systems she has been afebrile   do note we have been somewhat conservative and ordering labs but her potassium was recently depressed 3.4 we did give her a short course of potassium and then made it routine now for about a week in hopes of normalizing this-- and update lab is pending  Her sodium also was borderline elevated- her daughter who is actually a dietitian the facility has been aggressive pushing fluids and nursing staff has been doing this as well  Currently vital signs are stable she is sitting comfortably in her wheelchair no signs of distress    Past Medical History:  Diagnosis Date  . Cancer ([place])   . Dementia ([place])   . Gallstones   . Gastroparalysis   . HTN (hypertension)   . Infiltrating ductal carcinoma of right female breast ([place]) 12/02/2009   Qualifier: History of  By: [name] CMA [name]), June    . Osteoporosis 11/18/2011  . Reflux   . Renal disorder    renal insufficiency.    Past Surgical History:  Procedure Laterality Date  . BREAST BIOPSY    . BREAST LUMPECTOMY    . COLONOSCOPY  2009   Dr. Fuller Plan: normal  . ESOPHAGOGASTRODUODENOSCOPY  12/11/2009   Dr. Fuller Plan: hiatal hernia  . ESOPHAGOGASTRODUODENOSCOPY N/A 01/29/2014   Procedure: ESOPHAGOGASTRODUODENOSCOPY (EGD);  Surgeon: [name], MD;  Location: [facility];  Service: Endoscopy;  Laterality: N/A;  245  . TONSILLECTOMY AND ADENOIDECTOMY      Allergies  Allergen Reactions  . Aricept [Donepezil Hydrochloride] Nausea And Vomiting  . Exelon [Rivastigmine Tartrate]   . Lithium Nausea And Vomiting    Outpatient Encounter Medications as of 02/16/2018  Medication Sig  . acetaminophen (TYLENOL) 325 MG tablet Take two tablets by mouth twice a day, take 650 mg by mouth every 6 hours prn  .  amLODipine (NORVASC) 2.5 MG tablet Take 2.5 mg by mouth along with 5 mg to = 7.5 mg once a day  . amLODipine (NORVASC) 5 MG tablet Take 5 mg by mouth along with 2.5 mg to = 7.5 mg once a day  . docusate (COLACE) 50 MG/5ML liquid Take 100 mg by mouth daily.  . Multiple Vitamins-Minerals (MULTIVITAMINS/MINERALS ADULT) LIQD Take 5 mLs by mouth daily.  . Olopatadine HCl 0.2 % SOLN Place 1 drop into both eyes daily as needed.  [name] omeprazole (PRILOSEC) 20 MG capsule Take 20 mg by mouth daily.  . polyethylene glycol (MIRALAX / GLYCOLAX) packet Take 17 g by mouth daily.    . potassium chloride SA (K-DUR,KLOR-CON) 20 MEQ tablet Take 20 mEq by mouth daily. Take for 7 days form 02/16/2018-02/21/2018  . senna-docusate (SENOKOT-S) 8.6-50 MG tablet Take 2 tablets by mouth at bedtime.   . [DISCONTINUED] metoCLOPramide (REGLAN) 5 MG/5ML solution Give patient 2.5 ml by mouth twice a day  . [DISCONTINUED] ondansetron (ZOFRAN) 4 MG/5ML solution Give 2.5 ml by mouth twice a day   No facility-administered encounter medications on file as of 02/16/2018.     Review of Systems   Unobtainable secondary to dementia please see HPI  Immunization History  Administered Date(s) Administered  . Influenza-Unspecified 02/13/2014, 02/09/2016, 02/10/2017  . Pneumococcal Conjugate-13 02/10/2017  . Pneumococcal Polysaccharide-23 02/29/2016  . Tdap 01/30/2017   Pertinent  Health Maintenance Due  Topic Date Due  . INFLUENZA VACCINE  03/19/2018 (Originally 12/07/2017)  . DEXA SCAN  Completed  . PNA vac Low Risk Adult  Completed   Fall Risk  02/06/2018 01/26/2017  Falls in the past year? No No   Functional Status Survey:    Vitals:   02/16/18 1116  BP: 137/87  Pulse: 62  Resp: 17  Temp: 98.1 F (36.7 C)  TempSrc: Oral  SpO2: 96%    Physical Exam   In general this is a fairly well-nourished elderly female in no distress sitting comfortably in her wheelchair.  Her skin is warm and dry.  On left lower arm just  below the elbow area there is covering over the actual skin tear there appears to be a small amount of surrounding erythema and warmth approximately 1 to 2 cm on either side--this does not appear to be acutely tender   Eyes visual acuity appears to be intact sclera and conjunctive are clear.  Oropharynx was difficult to assess secondary patient not opening her mouth.  Chest was clear to auscultation with poor respiratory effort there is no labored breathing   heart is distant heart sounds was regular rate and rhythm from what I could auscultate-- radial pulse was regular  Abdomen is soft nontender with positive bowel sounds.  Musculoskeletal patient did not really follow verbal commands but appears able to move her extremities at baseline with baseline lower extremity weakness.  She has quite minimal lower extremity edema  Neurologic she is alert does not really respond  to verbal commands appears to be at baseline could not really appreciate lateralizing- she did not really speak to me today  Psych again findings consistent with advanced dementia  Labs reviewed: Recent Labs    05/10/17 0800  08/10/17 0702 02/07/18 0858 02/14/18 0702  NA  --    < > 143 142 146*  K  --    < > 4.0 3.4* 3.4*  CL  --    < > 105 107 112*  CO2  --    < > 26 29 25   GLUCOSE  --    < > 87 82 93  BUN  --    < > 43* 48* 48*  CREATININE  --    < > 1.82* 1.81* 1.71*  CALCIUM  --    < > 9.5 9.3 9.7  MG 2.7*  --   --   --   --    < > = values in this interval not displayed.   Recent Labs    04/12/17 0710 07/24/17 1911 02/07/18 0858  AST 30 23 20   ALT 50 13* 23  ALKPHOS 72 79 69  BILITOT 0.7 0.5 0.5  PROT 7.7 7.0 6.8  ALBUMIN 4.0 3.6 3.5   Recent Labs    07/24/17 1911 08/10/17 0702 02/07/18 0858  WBC 8.2 6.5 5.3  NEUTROABS 7.2 4.5 3.3  HGB 10.0* 12.1 12.4  HCT 32.4* 38.8  37.2 38.7  MCV 92.6 90.4 91.7  PLT 129* 250 211   Lab Results  Component Value Date   TSH 0.823 04/10/2017   Lab  Results  Component Value Date   HGBA1C  05/17/2007    5.4 (NOTE)   The ADA recommends the following therapeutic goals for glycemic   control related to Hgb A1C measurement:   Goal of Therapy:   < 7.0% Hgb A1C   Action Suggested:  > 8.0% Hgb A1C   Ref:  Diabetes Care, 22, Suppl. 1, 1999   Lab Results  Component Value Date   CHOL  05/22/2007    175        ATP III CLASSIFICATION:  <200     mg/dL   Desirable  200-239  mg/dL   Borderline High  >=240    mg/dL   High   HDL 37 (L) 05/22/2007   LDLCALC  05/22/2007    96        Total Cholesterol/HDL:CHD Risk Coronary Heart Disease Risk Table                     Men   Women  1/2 Average Risk   3.4   3.3   TRIG 208 (H) 05/22/2007   CHOLHDL 4.7 05/22/2007    Significant Diagnostic Results in last 30 days:  No results found.  Assessment/Plan  1 skin tear with suspected mild cellulitis- will start another short course of doxycycline 100 mg twice daily for 5 days as well as a probiotic twice daily for 7 days- and monitor she has responded well to this treatment in the past  2 history of borderline hypernatremia context of chronic renal disease- last sodium was mildly elevated at 146 staff and her daughter are pushing fluids- update labs are pending-.  3 mild hypokalemia as noted above this is being supplemented for a one-week course and update lab is pending early next week  4.  Dysphasia and gastroparesis- had been stable on Zofran and Reglan these doses were recently reduced because of concerns for  lethargy- it appears she is tolerating the dose reductions well at one-point recurrent vomiting dysphasia was a significant issue but I am not aware that this has really recurred to any significant extent I suspect some of the lethargy may be to her advancing dementia as well. She appeared to be at her mental status baseline when I saw her today--and nursing staff today says that she appears to [place]

## 2018-02-20 ENCOUNTER — Encounter: Payer: Self-pay | Admitting: Internal Medicine

## 2018-02-20 NOTE — Progress Notes (Signed)
Location:    [facility] Room Number: 110/W Place of Service:  SNF [phone]) Provider:  Freddi Starr, MD  Patient Care Team: [name], MD as PCP - General (Internal Medicine) [name], [name], MD as Consulting Physician (Gastroenterology)  Extended Emergency Contact Information Primary Emergency Contact: [name] Address: [address]          [place], [place] [address] [name] of [place] Phone: [phone] Work Phone: [phone] Mobile Phone: [phone] Relation: Daughter Secondary Emergency Contact: [name] Address: [address]          [place], [place] [address] [name] of [place] Phone: [phone] Mobile Phone: [phone] Relation: Relative  Code Status:  DNR Goals of care: Advanced Directive information Advanced Directives 02/20/2018  Does Patient Have a Medical Advance Directive? Yes  Type of Advance Directive Out of facility DNR (pink MOST or yellow form)  Does patient want to make changes to medical advance directive? No - Patient declined  Copy of [place] in Chart? No - copy requested  Would patient like information on creating a medical advance directive? No - Patient declined  Pre-existing out of facility DNR order (yellow form or pink MOST form) -     Chief Complaint  Patient presents with  . Acute Visit    F/U    HPI:  Pt is a 82 y.o. female seen today for an acute visit for    Past Medical History:  Diagnosis Date  . Cancer ([facility])   . Dementia ([facility])   . Gallstones   . Gastroparalysis   . HTN (hypertension)   . Infiltrating ductal carcinoma of right female breast ([facility]) 12/02/2009   Qualifier: History of  By: [name] CMA [name]), June    . Osteoporosis 11/18/2011  . Reflux   . Renal disorder    renal insufficiency.    Past Surgical History:  Procedure Laterality Date  . BREAST BIOPSY    . BREAST LUMPECTOMY    . COLONOSCOPY  2009   Dr. Fuller Plan: normal   . ESOPHAGOGASTRODUODENOSCOPY  12/11/2009   Dr. Fuller Plan: hiatal hernia  . ESOPHAGOGASTRODUODENOSCOPY N/A 01/29/2014   Procedure: ESOPHAGOGASTRODUODENOSCOPY (EGD);  Surgeon: [name], MD;  [facility];  Service: Endoscopy;  Laterality: N/A;  245  . TONSILLECTOMY AND ADENOIDECTOMY      Allergies  Allergen Reactions  . Aricept [Donepezil Hydrochloride] Nausea And Vomiting  . Exelon [Rivastigmine Tartrate]   . Lithium Nausea And Vomiting    Outpatient Encounter Medications as of 02/20/2018  Medication Sig  . acetaminophen (TYLENOL) 325 MG tablet Take two tablets by mouth twice a day, take 650 mg by mouth every 6 hours prn  . amLODipine (NORVASC) 2.5 MG tablet Take 2.5 mg by mouth along with 5 mg to = 7.5 mg once a day  . amLODipine (NORVASC) 5 MG tablet Take 5 mg by mouth along with 2.5 mg to = 7.5 mg once a day  . docusate (COLACE) 50 MG/5ML liquid Take 100 mg by mouth daily.  [name] doxycycline (DORYX) 100 MG EC tablet Take 100 mg by mouth 2 (two) times daily. Take for 5 days  . Multiple Vitamins-Minerals (MULTIVITAMINS/MINERALS ADULT) LIQD Take 5 mLs by mouth daily.  . Olopatadine HCl 0.2 % SOLN Place 1 drop into both eyes daily as needed.  [name] omeprazole (PRILOSEC) 20 MG capsule Take 20 mg by mouth daily.  . polyethylene glycol (MIRALAX / GLYCOLAX) packet Take 17 g  by mouth daily.    . potassium chloride SA (K-DUR,KLOR-CON) 20 MEQ tablet Take 20 mEq by mouth daily. Take for 7 days form 02/16/2018-02/21/2018  . Probiotic Product (RISA-BID PROBIOTIC) TABS Take 1 tablet by mouth twice a day from 02/16/2018-02/22/2018  . senna-docusate (SENOKOT-S) 8.6-50 MG tablet Take 2 tablets by mouth at bedtime.    No facility-administered encounter medications on file as of 02/20/2018.     Review of Systems  Immunization History  Administered Date(s) Administered  . Influenza-Unspecified 02/13/2014, 02/09/2016, 02/10/2017  . Pneumococcal Conjugate-13 02/10/2017  . Pneumococcal  Polysaccharide-23 02/29/2016  . Tdap 01/30/2017   Pertinent  Health Maintenance Due  Topic Date Due  . INFLUENZA VACCINE  Completed  . DEXA SCAN  Completed  . PNA vac Low Risk Adult  Completed   Fall Risk  02/06/2018 01/26/2017  Falls in the past year? No No   Functional Status Survey:    There were no vitals filed for this visit. There is no height or weight on file to calculate BMI. Physical Exam  Labs reviewed: Recent Labs    05/10/17 0800  08/10/17 0702 02/07/18 0858 02/14/18 0702  NA  --    < > 143 142 146*  K  --    < > 4.0 3.4* 3.4*  CL  --    < > 105 107 112*  CO2  --    < > 26 29 25   GLUCOSE  --    < > 87 82 93  BUN  --    < > 43* 48* 48*  CREATININE  --    < > 1.82* 1.81* 1.71*  CALCIUM  --    < > 9.5 9.3 9.7  MG 2.7*  --   --   --   --    < > = values in this interval not displayed.   Recent Labs    04/12/17 0710 07/24/17 1911 02/07/18 0858  AST 30 23 20   ALT 50 13* 23  ALKPHOS 72 79 69  BILITOT 0.7 0.5 0.5  PROT 7.7 7.0 6.8  ALBUMIN 4.0 3.6 3.5   Recent Labs    07/24/17 1911 08/10/17 0702 02/07/18 0858  WBC 8.2 6.5 5.3  NEUTROABS 7.2 4.5 3.3  HGB 10.0* 12.1 12.4  HCT 32.4* 38.8  37.2 38.7  MCV 92.6 90.4 91.7  PLT 129* 250 211   Lab Results  Component Value Date   TSH 0.823 04/10/2017   Lab Results  Component Value Date   HGBA1C  05/17/2007    5.4 (NOTE)   The ADA recommends the following therapeutic goals for glycemic   control related to Hgb A1C measurement:   Goal of Therapy:   < 7.0% Hgb A1C   Action Suggested:  > 8.0% Hgb A1C   Ref:  Diabetes Care, 22, Suppl. 1, 1999   Lab Results  Component Value Date   CHOL  05/22/2007    175        ATP III CLASSIFICATION:  <200     mg/dL   Desirable  200-239  mg/dL   Borderline High  >=240    mg/dL   High   HDL 37 (L) 05/22/2007   LDLCALC  05/22/2007    96        Total Cholesterol/HDL:CHD Risk Coronary Heart Disease Risk Table                     Men   Women  1/2 Average Risk  3.4    3.3   TRIG 208 (H) 05/22/2007   CHOLHDL 4.7 05/22/2007    Significant Diagnostic Results in last 30 days:  No results found.  Assessment/Plan There are no diagnoses linked to this encounter.      Oralia Manis, [place]

## 2018-02-21 ENCOUNTER — Encounter (HOSPITAL_COMMUNITY)
Admission: RE | Admit: 2018-02-21 | Discharge: 2018-02-21 | Disposition: A | Discharge status: Home / Self Care | Payer: Medicare Other | Source: Skilled Nursing Facility | Attending: Internal Medicine | Admitting: Internal Medicine

## 2018-02-21 ENCOUNTER — Non-Acute Institutional Stay (SKILLED_NURSING_FACILITY): Payer: Medicare Other | Admitting: Internal Medicine

## 2018-02-21 ENCOUNTER — Encounter: Payer: Self-pay | Admitting: Internal Medicine

## 2018-02-21 DIAGNOSIS — F0281 Dementia in other diseases classified elsewhere with behavioral disturbance: Secondary | ICD-10-CM

## 2018-02-21 DIAGNOSIS — F02818 Dementia in other diseases classified elsewhere, unspecified severity, with other behavioral disturbance: Secondary | ICD-10-CM

## 2018-02-21 DIAGNOSIS — L03114 Cellulitis of left upper limb: Secondary | ICD-10-CM | POA: Diagnosis not present

## 2018-02-21 DIAGNOSIS — E876 Hypokalemia: Secondary | ICD-10-CM

## 2018-02-21 DIAGNOSIS — F319 Bipolar disorder, unspecified: Secondary | ICD-10-CM | POA: Diagnosis not present

## 2018-02-21 DIAGNOSIS — E870 Hyperosmolality and hypernatremia: Secondary | ICD-10-CM

## 2018-02-21 DIAGNOSIS — G300 Alzheimer's disease with early onset: Secondary | ICD-10-CM | POA: Diagnosis not present

## 2018-02-21 DIAGNOSIS — L03113 Cellulitis of right upper limb: Secondary | ICD-10-CM | POA: Insufficient documentation

## 2018-02-21 DIAGNOSIS — I10 Essential (primary) hypertension: Secondary | ICD-10-CM | POA: Insufficient documentation

## 2018-02-21 LAB — BASIC METABOLIC PANEL
Anion gap: 8 (ref 5–15)
BUN: 44 mg/dL — ABNORMAL HIGH (ref 8–23)
CHLORIDE: 113 mmol/L — AB (ref 98–111)
CO2: 24 mmol/L (ref 22–32)
Calcium: 9.6 mg/dL (ref 8.9–10.3)
Creatinine, Ser: 1.78 mg/dL — ABNORMAL HIGH (ref 0.44–1.00)
GFR calc Af Amer: 30 mL/min — ABNORMAL LOW (ref >60)
GFR, EST NON AFRICAN AMERICAN: 26 mL/min — AB (ref >60)
Glucose, Bld: 102 mg/dL — ABNORMAL HIGH (ref 70–99)
POTASSIUM: 3.5 mmol/L (ref 3.5–5.1)
Sodium: 145 mmol/L (ref 135–145)

## 2018-02-21 NOTE — Progress Notes (Signed)
This encounter was created in error - please disregard.

## 2018-02-21 NOTE — Progress Notes (Signed)
Location:    [facility] Room Number: 110/W Place of Service:  SNF [phone]) Provider:  Freddi Starr, MD  Patient Care Team: [name], MD as PCP - General (Internal Medicine) [name], [name], MD as Consulting Physician (Gastroenterology)  Extended Emergency Contact Information Primary Emergency Contact: [name] Address: [address]          [place], [place] [address] [name] of [place] Phone: [phone] Work Phone: [phone] Mobile Phone: [phone] Relation: Daughter Secondary Emergency Contact: [name] Address: [address]          [place], [place] [address] [name] of [place] Phone: [phone] Mobile Phone: [phone] Relation: Relative  Code Status:  DNR Goals of care: Advanced Directive information Advanced Directives 02/21/2018  Does Patient Have a Medical Advance Directive? Yes  Type of Advance Directive Out of facility DNR (pink MOST or yellow form)  Does patient want to make changes to medical advance directive? No - Patient declined  Copy of [place] in Chart? No - copy requested  Would patient like information on creating a medical advance directive? No - Patient declined  Pre-existing out of facility DNR order (yellow form or pink MOST form) -     Chief complaint-acute visit to follow-up numerous issues including left arm cellulitis- hypokalemia-hypernatremia- dementia with some element of failure to thrive  HPI:  Pt is a 82 y.o. female seen today for an acute visit for numerous issues as noted above.  Patient is a long-term resident of facility with a history of advanced dementia as well as hypertension-chronic renal disease breast cancer status post mastectomy she also has a history of gastroparesis and GERD.  She is under largely comfort care measures with full care-.  Saw her recently for a skin tear on her left arm and she is finishing a short course of  doxycycline--  .  She also was noted on recent lab to have some mild hyper natremia-in nursing staff and her daughter have continued to encourage fluids aggressively-and this appears to have stabilized with a sodium of 145 today- previously had been slightly higher.  She is also had some mild hypokalemia this has been supplemented although complicated apparently with concerns that she would refuse her potassium.  However this appears normalized on today's lab at 3.5.  She also continues to have advancing dementia and appears to be somewhat more lethargic sleeping more at times- I have discussed this with her daughter who is actually a dietitian in the facility- she has expressed understanding-[name] does have gastroparesis and her Reglan and Zofran were recently discontinued for concerns this may be leading to the sedation at times- however she still has these episodes and I suspect this is more related to her advancing dementia--and this was expressed to her daughter who expressed understanding   Past Medical History:  Diagnosis Date  . Cancer ([facility])   . Dementia ([facility])   . Gallstones   . Gastroparalysis   . HTN (hypertension)   . Infiltrating ductal carcinoma of right female breast ([facility]) 12/02/2009   Qualifier: History of  By: [name] CMA [name]), June    . Osteoporosis 11/18/2011  . Reflux   . Renal disorder    renal insufficiency.    Past Surgical History:  Procedure Laterality Date  . BREAST BIOPSY    . BREAST LUMPECTOMY    . COLONOSCOPY  2009   Dr. Fuller Plan: normal  . ESOPHAGOGASTRODUODENOSCOPY  12/11/2009  Dr. Fuller Plan: hiatal hernia  . ESOPHAGOGASTRODUODENOSCOPY N/A 01/29/2014   Procedure: ESOPHAGOGASTRODUODENOSCOPY (EGD);  Surgeon: [name], MD;  [facility];  Service: Endoscopy;  Laterality: N/A;  245  . TONSILLECTOMY AND ADENOIDECTOMY      Allergies  Allergen Reactions  . Aricept [Donepezil Hydrochloride] Nausea And Vomiting  . Exelon [Rivastigmine  Tartrate]   . Lithium Nausea And Vomiting    Outpatient Encounter Medications as of 02/21/2018  Medication Sig  . acetaminophen (TYLENOL) 325 MG tablet Take two tablets by mouth twice a day, take 650 mg by mouth every 6 hours prn  . amLODipine (NORVASC) 2.5 MG tablet Take 2.5 mg by mouth along with 5 mg to = 7.5 mg once a day  . amLODipine (NORVASC) 5 MG tablet Take 5 mg by mouth along with 2.5 mg to = 7.5 mg once a day  . docusate (COLACE) 50 MG/5ML liquid Take 100 mg by mouth daily.  [name] doxycycline (DORYX) 100 MG EC tablet Take 100 mg by mouth 2 (two) times daily. Take for 5 days  . Multiple Vitamins-Minerals (MULTIVITAMINS/MINERALS ADULT) LIQD Take 5 mLs by mouth daily.  . Olopatadine HCl 0.2 % SOLN Place 1 drop into both eyes daily as needed.  [name] omeprazole (PRILOSEC) 20 MG capsule Take 20 mg by mouth daily.  . polyethylene glycol (MIRALAX / GLYCOLAX) packet Take 17 g by mouth daily.    . potassium chloride SA (K-DUR,KLOR-CON) 20 MEQ tablet Take 20 mEq by mouth daily. Take for 7 days form 02/16/2018-02/21/2018  . Probiotic Product (RISA-BID PROBIOTIC) TABS Take 1 tablet by mouth twice a day from 02/16/2018-02/22/2018  . senna-docusate (SENOKOT-S) 8.6-50 MG tablet Take 2 tablets by mouth at bedtime.    No facility-administered encounter medications on file as of 02/21/2018.     Review of Systems   Is unobtainable secondary to dementia  Immunization History  Administered Date(s) Administered  . Influenza-Unspecified 02/13/2014, 02/09/2016, 02/10/2017  . Pneumococcal Conjugate-13 02/10/2017  . Pneumococcal Polysaccharide-23 02/29/2016  . Tdap 01/30/2017   Pertinent  Health Maintenance Due  Topic Date Due  . INFLUENZA VACCINE  Completed  . DEXA SCAN  Completed  . PNA vac Low Risk Adult  Completed   Fall Risk  02/06/2018 01/26/2017  Falls in the past year? No No   Functional Status Survey:     Temperature is 97.9 pulse is 60 respirations 18 blood pressure 139/83- O2  saturation is 95% on room air.   Physical Exam   In general this is a somewhat frail-appearing elderly female in no distress lying comfortably in her wheelchair she does make eye contact and is awake but not speaking today.  Her skin is warm and dry on lower left arm below the elbow there is covering over the skin tear - she is completing course of doxycycline there was covering over this site- when it was removed on the  next morning october 17 I did note some residual erythema around the site  Eyes sclera and conjunctive are clear visual acuity appears to be intact she does make eye contact.  Oropharynx appeared to be clear although very limited exam since she did not open her mouth very wide- mucous membranes appear moist  Chest clear to auscultation with shallow air entry and poor respiratory effort does not really follow verbal commands.  Heart is regular rate and rhythm without murmur gallop rub she has quite minimal pedal edema most likely dependent.  Abdomen is soft does not appear to be  tender there are positive bowel sounds.  Musculoskeletal does hold her upper arms and somewhat of a contracted position but can extend these.  Continues with baseline lower extremity weakness.  Neurologic she is alert but does not really acknowledge verbal commands.  Could not really appreciate lateralizing findings.  But this continues to be limited because she does not follow verbal commands.  Psych findings consistent with advanced dementia  Labs reviewed: Recent Labs    05/10/17 0800  02/07/18 0858 02/14/18 0702 02/21/18 0700  NA  --    < > 142 146* 145  K  --    < > 3.4* 3.4* 3.5  CL  --    < > 107 112* 113*  CO2  --    < > 29 25 24   GLUCOSE  --    < > 82 93 102*  BUN  --    < > 48* 48* 44*  CREATININE  --    < > 1.81* 1.71* 1.78*  CALCIUM  --    < > 9.3 9.7 9.6  MG 2.7*  --   --   --   --    < > = values in this interval not displayed.   Recent Labs    04/12/17 0710  07/24/17 1911 02/07/18 0858  AST 30 23 20   ALT 50 13* 23  ALKPHOS 72 79 69  BILITOT 0.7 0.5 0.5  PROT 7.7 7.0 6.8  ALBUMIN 4.0 3.6 3.5   Recent Labs    07/24/17 1911 08/10/17 0702 02/07/18 0858  WBC 8.2 6.5 5.3  NEUTROABS 7.2 4.5 3.3  HGB 10.0* 12.1 12.4  HCT 32.4* 38.8  37.2 38.7  MCV 92.6 90.4 91.7  PLT 129* 250 211   Lab Results  Component Value Date   TSH 0.823 04/10/2017   Lab Results  Component Value Date   HGBA1C  05/17/2007    5.4 (NOTE)   The ADA recommends the following therapeutic goals for glycemic   control related to Hgb A1C measurement:   Goal of Therapy:   < 7.0% Hgb A1C   Action Suggested:  > 8.0% Hgb A1C   Ref:  Diabetes Care, 22, Suppl. 1, 1999   Lab Results  Component Value Date   CHOL  05/22/2007    175        ATP III CLASSIFICATION:  <200     mg/dL   Desirable  200-239  mg/dL   Borderline High  >=240    mg/dL   High   HDL 37 (L) 05/22/2007   LDLCALC  05/22/2007    96        Total Cholesterol/HDL:CHD Risk Coronary Heart Disease Risk Table                     Men   Women  1/2 Average Risk   3.4   3.3   TRIG 208 (H) 05/22/2007   CHOLHDL 4.7 05/22/2007    Significant Diagnostic Results in last 30 days:  No results found.  Assessment/Plan  #1 history of hypokalemia this appears to be stabilized with potassium 3.5- we have been trying to minimize labs secondary to comfort care status- I did discuss this with nursing and her daughter who is actually the dietitian in the facility and will try to encourage and give her higher potassium foods-at this point will withhold doing further labs.  2.  Hyper natremia again this has stabilized- her daughter and nursing staff are encouraging fluids.  This is complicated with her renal disease creatinine appears relatively baseline at 1.78 today with a BUN in the 40s.  3.-  History of left arm cellulitis after the dressing was removed and I was really able to reevaluated.  There was some residual  erythema   4.  History of dementia- this is gradually progressing which I suspect is leading ito ome more lethargic episodes this was discussed with her daughter as well ---who expressed understanding that this is part of the process- continue supportive care and comfort care measures   [phone]   Addendum-of note the dressing was able to be removed on the morning of October 17 and that when I saw some residual erythema and will extend the doxycycline for 3 additional days  And monitor

## 2018-02-22 ENCOUNTER — Encounter: Payer: Self-pay | Admitting: Internal Medicine

## 2018-03-02 ENCOUNTER — Encounter: Payer: Self-pay | Admitting: Internal Medicine

## 2018-03-02 ENCOUNTER — Non-Acute Institutional Stay (SKILLED_NURSING_FACILITY): Payer: Medicare Other | Admitting: Internal Medicine

## 2018-03-02 DIAGNOSIS — R1110 Vomiting, unspecified: Secondary | ICD-10-CM

## 2018-03-02 DIAGNOSIS — K219 Gastro-esophageal reflux disease without esophagitis: Secondary | ICD-10-CM | POA: Diagnosis not present

## 2018-03-02 DIAGNOSIS — R197 Diarrhea, unspecified: Secondary | ICD-10-CM

## 2018-03-02 NOTE — Progress Notes (Signed)
Location:    [facility] Room Number: 110/W Place of Service:  SNF [phone]) Provider:  Freddi Starr, MD  Patient Care Team: [name], MD as PCP - General (Internal Medicine) [name], [name], MD as Consulting Physician (Gastroenterology)  Extended Emergency Contact Information Primary Emergency Contact: [name] Address: [address]          [place], [place] [address] [name] of [place] Phone: [phone] Work Phone: [phone] Mobile Phone: [phone] Relation: Daughter Secondary Emergency Contact: [name] Address: [address]          [place],  [address] [name] of [place] Phone: [phone] Mobile Phone: [phone] Relation: Relative  Code Status:  DNR Goals of care: Advanced Directive information Advanced Directives 03/02/2018  Does Patient Have a Medical Advance Directive? Yes  Type of Advance Directive Out of facility DNR (pink MOST or yellow form)  Does patient want to make changes to medical advance directive? No - Patient declined  Copy of [place] in Chart? No - copy requested  Would patient like information on creating a medical advance directive? No - Patient declined  Pre-existing out of facility DNR order (yellow form or pink MOST form) -     Chief Complaint  Patient presents with  . Acute Visit    Vomiting and Diarrhea    HPI:  Pt is a 82 y.o. female seen today for an acute visit for follow-up of vomiting and diarrhea-.  Apparently this started at some point late yesterday-and apparently she did have an episode of vomiting and diarrhea this morning as well.  Patient is a long-term resident of facility with a history of advanced dementia as well as hypertension-chronic renal disease breast cancer status post mastectomy she also has a history of gastroparesis and GERD.  She is under largely comfort care measures   Reglan and Zofran were recently  discontinued because of concerns of sedation- she previously did have a significant history of gastroparesis with vomiting.  She also recently completed a course of doxycycline for suspected cellulitis from skin tears.  She is a poor historian secondary to dementia but vital signs are stable she is afebrile- she is currently lying in bed does not appear to be in any distress no longer talks much however.  She was also seen recently for mild hyponatremia and hypokalemia which the potassium was supplemented and has normalized- sodium last noted at 145 which was somewhat of improvement-her daughter who is actually dietitian in the facility as well as nursing staff have been aggressively pushing fluids -.    Past Medical History:  Diagnosis Date  . Cancer ([place])   . Dementia ([facility])   . Gallstones   . Gastroparalysis   . HTN (hypertension)   . Infiltrating ductal carcinoma of right female breast ([facility]) 12/02/2009   Qualifier: History of  By: [name] CMA [name]), June    . Osteoporosis 11/18/2011  . Reflux   . Renal disorder    renal insufficiency.    Past Surgical History:  Procedure Laterality Date  . BREAST BIOPSY    . BREAST LUMPECTOMY    . COLONOSCOPY  2009   Dr. Fuller Plan: normal  . ESOPHAGOGASTRODUODENOSCOPY  12/11/2009   Dr. Fuller Plan: hiatal hernia  . ESOPHAGOGASTRODUODENOSCOPY N/A 01/29/2014   Procedure: ESOPHAGOGASTRODUODENOSCOPY (EGD);  Surgeon: [name], MD;  [facility];  Service: Endoscopy;  Laterality: N/A;  245  . TONSILLECTOMY AND ADENOIDECTOMY  Allergies  Allergen Reactions  . Aricept [Donepezil Hydrochloride] Nausea And Vomiting  . Exelon [Rivastigmine Tartrate]   . Lithium Nausea And Vomiting    Outpatient Encounter Medications as of 03/02/2018  Medication Sig  . acetaminophen (TYLENOL) 325 MG tablet Take two tablets by mouth twice a day, take 650 mg by mouth every 6 hours prn  . amLODipine (NORVASC) 2.5 MG tablet Take 2.5 mg by mouth along with  5 mg to = 7.5 mg once a day  . amLODipine (NORVASC) 5 MG tablet Take 5 mg by mouth along with 2.5 mg to = 7.5 mg once a day  . docusate (COLACE) 50 MG/5ML liquid Take 100 mg by mouth daily.  . Multiple Vitamins-Minerals (MULTIVITAMINS/MINERALS ADULT) LIQD Take 5 mLs by mouth daily.  . Olopatadine HCl 0.2 % SOLN Place 1 drop into both eyes daily as needed.  [name] omeprazole (PRILOSEC) 20 MG capsule Take 20 mg by mouth daily.  . polyethylene glycol (MIRALAX / GLYCOLAX) packet Take 17 g by mouth daily.    [name] senna-docusate (SENOKOT-S) 8.6-50 MG tablet Take 2 tablets by mouth at bedtime.   . [DISCONTINUED] doxycycline (DORYX) 100 MG EC tablet Take 100 mg by mouth 2 (two) times daily. Take for 5 days  . [DISCONTINUED] potassium chloride SA (K-DUR,KLOR-CON) 20 MEQ tablet Take 20 mEq by mouth daily. Take for 7 days form 02/16/2018-02/21/2018  . [DISCONTINUED] Probiotic Product (RISA-BID PROBIOTIC) TABS Take 1 tablet by mouth twice a day from 02/16/2018-02/22/2018   No facility-administered encounter medications on file as of 03/02/2018.     Review of Systems   Essentially unobtainable secondary to dementia please see HPI  Immunization History  Administered Date(s) Administered  . Influenza-Unspecified 02/13/2014, 02/09/2016, 02/10/2017  . Pneumococcal Conjugate-13 02/10/2017  . Pneumococcal Polysaccharide-23 02/29/2016  . Tdap 01/30/2017   Pertinent  Health Maintenance Due  Topic Date Due  . INFLUENZA VACCINE  Completed  . DEXA SCAN  Completed  . PNA vac Low Risk Adult  Completed   Fall Risk  02/06/2018 01/26/2017  Falls in the past year? No No   Functional Status Survey:    Vitals:   03/02/18 1122  BP: 120/76  Pulse: 70  Resp: 19  Temp: 98.9 F (37.2 C)  TempSrc: Oral  SpO2: 97%    Physical Exam   In general this is a somewhat frail-appearing elderly female in no distress lying in bed.  Her skin is warm and dry she is not diaphoretic the skin tear on her arms appear to be  resolved  Eyes visual acuity appears to be intact she often holds her eyes shot sclera and conjunctive are clear.  Oropharynx was difficult to assess since she would not really open her mouth.  Chest is clear to auscultation with poor respiratory effort she does not really follow verbal commands but could not appreciate congestion or any sign of labored breathing.  Heart is largely regular rate and rhythm with occasional skipped beats I got a rate of 58.  She has quite minimal lower extremity edema.  Her abdomen is soft does not appear to be tender there are positive bowel sounds she did not really grimace with palpation.  GU could not really appreciate suprapubic tenderness.  Musculoskeletal continues to hold her arms in a somewhat contracted position- has baseline weakness but I could not really appreciate any changes from baseline.  Neurologic she is alert but often holds her eyes shut --could not really appreciate lateralizing findings she appears to be at  baseline.  Does not follow verbal commands which makes complete assessment difficult.  Psych findings as noted above consistent with advancing dementia Labs reviewed: Recent Labs    05/10/17 0800  02/07/18 0858 02/14/18 0702 02/21/18 0700  NA  --    < > 142 146* 145  K  --    < > 3.4* 3.4* 3.5  CL  --    < > 107 112* 113*  CO2  --    < > 29 25 24   GLUCOSE  --    < > 82 93 102*  BUN  --    < > 48* 48* 44*  CREATININE  --    < > 1.81* 1.71* 1.78*  CALCIUM  --    < > 9.3 9.7 9.6  MG 2.7*  --   --   --   --    < > = values in this interval not displayed.   Recent Labs    04/12/17 0710 07/24/17 1911 02/07/18 0858  AST 30 23 20   ALT 50 13* 23  ALKPHOS 72 79 69  BILITOT 0.7 0.5 0.5  PROT 7.7 7.0 6.8  ALBUMIN 4.0 3.6 3.5   Recent Labs    07/24/17 1911 08/10/17 0702 02/07/18 0858  WBC 8.2 6.5 5.3  NEUTROABS 7.2 4.5 3.3  HGB 10.0* 12.1 12.4  HCT 32.4* 38.8  37.2 38.7  MCV 92.6 90.4 91.7  PLT 129* 250 211    Lab Results  Component Value Date   TSH 0.823 04/10/2017   Lab Results  Component Value Date   HGBA1C  05/17/2007    5.4 (NOTE)   The ADA recommends the following therapeutic goals for glycemic   control related to Hgb A1C measurement:   Goal of Therapy:   < 7.0% Hgb A1C   Action Suggested:  > 8.0% Hgb A1C   Ref:  Diabetes Care, 22, Suppl. 1, 1999   Lab Results  Component Value Date   CHOL  05/22/2007    175        ATP III CLASSIFICATION:  <200     mg/dL   Desirable  200-239  mg/dL   Borderline High  >=240    mg/dL   High   HDL 37 (L) 05/22/2007   LDLCALC  05/22/2007    96        Total Cholesterol/HDL:CHD Risk Coronary Heart Disease Risk Table                     Men   Women  1/2 Average Risk   3.4   3.3   TRIG 208 (H) 05/22/2007   CHOLHDL 4.7 05/22/2007    Significant Diagnostic Results in last 30 days:  No results found.  Assessment/Plan  #1 nausea and vomiting-with some diarrhea.  Could be numerous etiologies including a passing virus- or possibly increased symptoms of gastroparesis now that she is off Reglan and Zofran.  Also cannot rule out C. difficile since she has been on recent antibiotic.  Will institute a clear liquid diet for now And hold senna l docusate as well as MiraLAX until diarrhea resolves.  Monitor vital signs pulse ox to shift for 72 hours.  And will institute Zofran 4 mg every 8 hours as needed for nausea and vomiting for now.  Also will check a C. difficile culture of any loose stool.  I did have a discussion with her daughter [name] at bedside- she stated she does not really want any  aggressive measures and a most form has been filled out.  At this point does not desire any further labs or checking of the urine.  Will check for C. difficile since she has been on antibiotics and this is okay withLynn  [name] did state that if her mother does not really improve over the weekend she would most likely consider hospice.  Again at this point  will monitor-- obtain a C. difficile culture when possible- clear liquids for now and Zofran as needed  On reevaluation after another vomiting episode she appeared to be at baseline- physical exam was essentially unchanged I could not really appreciate any chest congestion or sign of distress. She does not appear to be having any acute abdominal pain.  [id number] \

## 2018-03-05 ENCOUNTER — Non-Acute Institutional Stay (SKILLED_NURSING_FACILITY): Payer: Medicare Other | Admitting: Internal Medicine

## 2018-03-05 ENCOUNTER — Encounter: Payer: Self-pay | Admitting: Internal Medicine

## 2018-03-05 DIAGNOSIS — I10 Essential (primary) hypertension: Secondary | ICD-10-CM | POA: Diagnosis not present

## 2018-03-05 DIAGNOSIS — H2513 Age-related nuclear cataract, bilateral: Secondary | ICD-10-CM | POA: Diagnosis not present

## 2018-03-05 DIAGNOSIS — R627 Adult failure to thrive: Secondary | ICD-10-CM | POA: Diagnosis not present

## 2018-03-05 DIAGNOSIS — R112 Nausea with vomiting, unspecified: Secondary | ICD-10-CM

## 2018-03-05 DIAGNOSIS — R1314 Dysphagia, pharyngoesophageal phase: Secondary | ICD-10-CM | POA: Diagnosis not present

## 2018-03-05 DIAGNOSIS — H1045 Other chronic allergic conjunctivitis: Secondary | ICD-10-CM | POA: Diagnosis not present

## 2018-03-05 NOTE — Progress Notes (Signed)
Location:    [facility] Room Number: 110/W Place of Service:  SNF [phone]) Provider:  Veleta Miners MD  [name], MD  Patient Care Team: [name], MD as PCP - General (Internal Medicine) [name], [name], MD as Consulting Physician (Gastroenterology)  Extended Emergency Contact Information Primary Emergency Contact: [facility] Address: [address]          [place], [place] [address] [name] of [place] Phone: [phone] Work Phone: [phone] Mobile Phone: [phone] Relation: Daughter Secondary Emergency Contact: [name] Address: [address]          [place], [place] [address] [name] of [place] Phone: [phone] Mobile Phone: [phone] Relation: Relative  Code Status:  DNR Goals of care: Advanced Directive information Advanced Directives 03/05/2018  Does Patient Have a Medical Advance Directive? Yes  Type of Advance Directive Out of facility DNR (pink MOST or yellow form)  Does patient want to make changes to medical advance directive? No - Patient declined  Copy of [place] in Chart? No - copy requested  Would patient like information on creating a medical advance directive? No - Patient declined  Pre-existing out of facility DNR order (yellow form or pink MOST form) -     Chief Complaint  Patient presents with  . Acute Visit    Fever    HPI:  Pt is a 82 y.o. female seen today for an acute visit for Fever with Vomiting.  Patient has h/o hypertension, Advanced Dementia, Chronic renal disease, Breast cancer s/p Mastectomy, GERD and Gastroparesis. Patient is Long term Resident of facility. She recently has been more sleepy then usual. Her Zofran and Reglan was stopped but she continues to be sleepy. She started to have vomiting 2 days ago and this morning she spiked Temp this morning which has responded to Tylenol Patient has not had any cough , SOB or Chest Pain. Per her daughter  she is slightly More awake then usual. She continues to  not eat much. She is on Liquid Diet. Patient unable to give any history due to her Dementia.      Past Medical History:  Diagnosis Date  . Cancer ([facility])   . Dementia ([facility])   . Gallstones   . Gastroparalysis   . HTN (hypertension)   . Infiltrating ductal carcinoma of right female breast ([facility]) 12/02/2009   Qualifier: History of  By: [name] CMA [name]), June    . Osteoporosis 11/18/2011  . Reflux   . Renal disorder    renal insufficiency.    Past Surgical History:  Procedure Laterality Date  . BREAST BIOPSY    . BREAST LUMPECTOMY    . COLONOSCOPY  2009   Dr. Fuller Plan: normal  . ESOPHAGOGASTRODUODENOSCOPY  12/11/2009   Dr. Fuller Plan: hiatal hernia  . ESOPHAGOGASTRODUODENOSCOPY N/A 01/29/2014   Procedure: ESOPHAGOGASTRODUODENOSCOPY (EGD);  Surgeon: [name], MD;  [facility];  Service: Endoscopy;  Laterality: N/A;  245  . TONSILLECTOMY AND ADENOIDECTOMY      Allergies  Allergen Reactions  . Aricept [Donepezil Hydrochloride] Nausea And Vomiting  . Exelon [Rivastigmine Tartrate]   . Lithium Nausea And Vomiting    Outpatient Encounter Medications as of 03/05/2018  Medication Sig  . acetaminophen (TYLENOL) 325 MG tablet Take two tablets by mouth twice a day, take 650 mg by mouth every 6 hours prn  . amLODipine (NORVASC) 2.5 MG tablet Take 2.5 mg by mouth along with 5 mg to = 7.5  mg once a day  . amLODipine (NORVASC) 5 MG tablet Take 5 mg by mouth along with 2.5 mg to = 7.5 mg once a day  . Multiple Vitamins-Minerals (MULTIVITAMINS/MINERALS ADULT) LIQD Take 5 mLs by mouth daily.  . Olopatadine HCl 0.2 % SOLN Place 1 drop into both eyes daily as needed.  [name] omeprazole (PRILOSEC) 20 MG capsule Take 20 mg by mouth daily.  . ondansetron (ZOFRAN) 4 MG tablet Take 4 mg by mouth every 8 (eight) hours as needed for nausea or vomiting.  . senna-docusate (SENOKOT-S) 8.6-50 MG tablet Take 2 tablets by mouth at bedtime.   .  [DISCONTINUED] docusate (COLACE) 50 MG/5ML liquid Take 100 mg by mouth daily.  . [DISCONTINUED] polyethylene glycol (MIRALAX / GLYCOLAX) packet Take 17 g by mouth daily.     No facility-administered encounter medications on file as of 03/05/2018.      Review of Systems  Unable to perform ROS: Dementia    Immunization History  Administered Date(s) Administered  . Influenza-Unspecified 02/13/2014, 02/09/2016, 02/10/2017  . Pneumococcal Conjugate-13 02/10/2017  . Pneumococcal Polysaccharide-23 02/29/2016  . Tdap 01/30/2017   Pertinent  Health Maintenance Due  Topic Date Due  . INFLUENZA VACCINE  Completed  . DEXA SCAN  Completed  . PNA vac Low Risk Adult  Completed   Fall Risk  02/06/2018 01/26/2017  Falls in the past year? No No   Functional Status Survey:    Vitals:   03/05/18 0941  BP: (!) 169/95  Pulse: 77  Resp: 18  Temp: 98 F (36.7 C)  TempSrc: Oral  SpO2: 95%   There is no height or weight on file to calculate BMI. Physical Exam  Constitutional: She appears well-developed.  HENT:  Head: Normocephalic.  Mouth/Throat: Oropharynx is clear and moist.  Eyes: Pupils are equal, round, and reactive to light.  Neck: Neck supple.  Cardiovascular: Normal rate and regular rhythm.  Pulmonary/Chest: Effort normal and breath sounds normal. No stridor. No respiratory distress.  Abdominal: Soft. Bowel sounds are normal. She exhibits no distension. There is no tenderness. There is no guarding.  Musculoskeletal: She exhibits no edema.  Neurological: She is alert.  Does not follow any Commands.And is non Verbal. She is wheelchair bound and is not Ambulatory.  Skin: Skin is warm and dry.  Psychiatric: She has a normal mood and affect. Her behavior is normal.    Labs reviewed: Recent Labs    05/10/17 0800  02/07/18 0858 02/14/18 0702 02/21/18 0700  NA  --    < > 142 146* 145  K  --    < > 3.4* 3.4* 3.5  CL  --    < > 107 112* 113*  CO2  --    < > 29 25 24   GLUCOSE   --    < > 82 93 102*  BUN  --    < > 48* 48* 44*  CREATININE  --    < > 1.81* 1.71* 1.78*  CALCIUM  --    < > 9.3 9.7 9.6  MG 2.7*  --   --   --   --    < > = values in this interval not displayed.   Recent Labs    04/12/17 0710 07/24/17 1911 02/07/18 0858  AST 30 23 20   ALT 50 13* 23  ALKPHOS 72 79 69  BILITOT 0.7 0.5 0.5  PROT 7.7 7.0 6.8  ALBUMIN 4.0 3.6 3.5   Recent Labs    07/24/17  1911 08/10/17 0702 02/07/18 0858  WBC 8.2 6.5 5.3  NEUTROABS 7.2 4.5 3.3  HGB 10.0* 12.1 12.4  HCT 32.4* 38.8  37.2 38.7  MCV 92.6 90.4 91.7  PLT 129* 250 211   Lab Results  Component Value Date   TSH 0.823 04/10/2017   Lab Results  Component Value Date   HGBA1C  05/17/2007    5.4 (NOTE)   The ADA recommends the following therapeutic goals for glycemic   control related to Hgb A1C measurement:   Goal of Therapy:   < 7.0% Hgb A1C   Action Suggested:  > 8.0% Hgb A1C   Ref:  Diabetes Care, 22, Suppl. 1, 1999   Lab Results  Component Value Date   CHOL  05/22/2007    175        ATP III CLASSIFICATION:  <200     mg/dL   Desirable  200-239  mg/dL   Borderline High  >=240    mg/dL   High   HDL 37 (L) 05/22/2007   LDLCALC  05/22/2007    96        Total Cholesterol/HDL:CHD Risk Coronary Heart Disease Risk Table                     Men   Women  1/2 Average Risk   3.4   3.3   TRIG 208 (H) 05/22/2007   CHOLHDL 4.7 05/22/2007    Significant Diagnostic Results in last 30 days:  No results found.  Assessment/Plan  Fever Can be due to Aspiration or UTI D/W the daughter and she does not want anything aggressive and does not want anymore blood work Will Not do any more work up. D/W the Nurse Essential hypertension Blood pressure slightly high today on Norvasc 7.5 mg  Dysphagia and gastroparesis Will restart her on Zofran 4 Mg BID. Continue PRN Zofran She is only on Liquid Diet. Her Weight has been stable so Far  Recent Lethargy D/W daughter and Nurses  No More work up  for now Alzheimer's dementia With Failure to thrive Continuesto be full Care.She is comfort care No More Aggressive Treatment Renal insufficiency Worsening of her renal function Advance Care Planning Patient is Comfort care and as per her daughter she will get Hospice involved if Mrs Waldron Deteriorate further.     Family/ staff Communication:   Labs/tests ordered:   Total time spent in this patient care encounter was 45_ minutes; greater than 50% of the visit spent counseling Family, reviewing records , Labs and coordinating care for problems addressed at this encounter.

## 2018-03-14 ENCOUNTER — Non-Acute Institutional Stay (SKILLED_NURSING_FACILITY): Payer: Medicare Other | Admitting: Internal Medicine

## 2018-03-14 ENCOUNTER — Encounter: Payer: Self-pay | Admitting: Internal Medicine

## 2018-03-14 DIAGNOSIS — F02818 Dementia in other diseases classified elsewhere, unspecified severity, with other behavioral disturbance: Secondary | ICD-10-CM

## 2018-03-14 DIAGNOSIS — R112 Nausea with vomiting, unspecified: Secondary | ICD-10-CM | POA: Diagnosis not present

## 2018-03-14 DIAGNOSIS — I10 Essential (primary) hypertension: Secondary | ICD-10-CM | POA: Diagnosis not present

## 2018-03-14 DIAGNOSIS — G300 Alzheimer's disease with early onset: Secondary | ICD-10-CM

## 2018-03-14 DIAGNOSIS — K3184 Gastroparesis: Secondary | ICD-10-CM

## 2018-03-14 DIAGNOSIS — F0281 Dementia in other diseases classified elsewhere with behavioral disturbance: Secondary | ICD-10-CM

## 2018-03-14 NOTE — Progress Notes (Signed)
Location:    [facility] Room Number: 110/W Place of Service:  SNF [phone]) Provider: Granville Lewis PA-C  [name], MD  Patient Care Team: [name], MD as PCP - General (Internal Medicine) [name], [name], MD as Consulting Physician (Gastroenterology)  Extended Emergency Contact Information Primary Emergency Contact: [name] Address: [address]          [place], [place] [address] [name] of [place] Phone: [phone] Work Phone: [phone] Mobile Phone: [phone] Relation: Daughter Secondary Emergency Contact: [name] Address: [address]          [place], [place] [address] [name] of [place] Phone: [phone] Mobile Phone: [phone] Relation: Relative  Code Status:  DNR Goals of care: Advanced Directive information Advanced Directives 03/14/2018  Does Patient Have a Medical Advance Directive? Yes  Type of Advance Directive Out of facility DNR (pink MOST or yellow form)  Does patient want to make changes to medical advance directive? No - Patient declined  Copy of [address] in Chart? No - copy requested  Would patient like information on creating a medical advance directive? No - Patient declined  Pre-existing out of facility DNR order (yellow form or pink MOST form) -     Chief Complaint  Patient presents with  . Medical Management of Chronic Issues    Routine visit of medical management    Medical management of chronic medical conditions including end-stage dementia dysphasia and gastroparesis chronic kidney disease hypertension- history of breast cancer- status post mastectomy-recent history of fevers with lethargy  HPI:  Pt is a 82 y.o. female seen today for medical management of chronic diseases.  As noted above.  Most recent acute issue was late last month when she developed vomiting with a fever.  She also had increased somnolence- at one point her Zofran and Reglan were  stopped previously because of lethargy but she continued to be somewhat lethargic- and then she did develop some vomiting again and had the fever-.  Her daughter who is actually dietitian in the facility did not request any aggressive work-up of the fever or lab work-with emphasis on comfort care.  Patient actually has improved she has been afebrile now for some time without any further vomiting-there was concern possibly she had aspiration or infection but again no work-up was desired and she did improve clinically.  She still at times has some lethargy- her Zofran has been restarted back twice daily every 8 hours as needed hopefully this will help with these vomiting episodes.  Nursing does not really report any issues today other than occasional somnolence suspect this could be due to her advancing dementia.  Vital signs are stable patient cannot really give any review of systems because of her dementia   Past Medical History:  Diagnosis Date  . Cancer ([place])   . Dementia ([facility])   . Gallstones   . Gastroparalysis   . HTN (hypertension)   . Infiltrating ductal carcinoma of right female breast ([facility]) 12/02/2009   Qualifier: History of  By: [name] CMA [name]), June    . Osteoporosis 11/18/2011  . Reflux   . Renal disorder    renal insufficiency.    Past Surgical History:  Procedure Laterality Date  . BREAST BIOPSY    . BREAST LUMPECTOMY    . COLONOSCOPY  2009   Dr. Fuller Plan: normal  . ESOPHAGOGASTRODUODENOSCOPY  12/11/2009   Dr. Fuller Plan: hiatal hernia  . ESOPHAGOGASTRODUODENOSCOPY N/A 01/29/2014   Procedure:  ESOPHAGOGASTRODUODENOSCOPY (EGD);  Surgeon: [name], MD;  [facility];  Service: Endoscopy;  Laterality: N/A;  245  . TONSILLECTOMY AND ADENOIDECTOMY      Allergies  Allergen Reactions  . Aricept [Donepezil Hydrochloride] Nausea And Vomiting  . Exelon [Rivastigmine Tartrate]   . Lithium Nausea And Vomiting    Outpatient Encounter Medications as of 03/14/2018   Medication Sig  . acetaminophen (TYLENOL) 325 MG tablet Take two tablets by mouth twice a day, take 650 mg by mouth every 6 hours prn  . amLODipine (NORVASC) 2.5 MG tablet Take 2.5 mg by mouth along with 5 mg to = 7.5 mg once a day  . amLODipine (NORVASC) 5 MG tablet Take 5 mg by mouth along with 2.5 mg to = 7.5 mg once a day  . docusate (COLACE) 50 MG/5ML liquid Give 10 ml by mouth one a day  . Multiple Vitamins-Minerals (MULTIVITAMINS/MINERALS ADULT) LIQD Take 5 mLs by mouth daily.  . Olopatadine HCl 0.2 % SOLN Place 1 drop into both eyes daily as needed.  [name] omeprazole (PRILOSEC) 20 MG capsule Take 20 mg by mouth daily.  . ondansetron (ZOFRAN) 4 MG tablet Take 4 mg by mouth 2 (two) times daily.  . ondansetron (ZOFRAN) 4 MG tablet Take 4 mg by mouth every 8 (eight) hours as needed for nausea or vomiting.  . polyethylene glycol (MIRALAX / GLYCOLAX) packet Take 17 g by mouth daily.  [name] senna-docusate (SENOKOT-S) 8.6-50 MG tablet Take 2 tablets by mouth at bedtime.   . [DISCONTINUED] ondansetron (ZOFRAN) 4 MG tablet Take 4 mg by mouth every 8 (eight) hours as needed for nausea or vomiting.   No facility-administered encounter medications on file as of 03/14/2018.      Review of Systems   Unobtainable secondary to dementia  Immunization History  Administered Date(s) Administered  . Influenza-Unspecified 02/13/2014, 02/09/2016, 02/10/2017  . Pneumococcal Conjugate-13 02/10/2017  . Pneumococcal Polysaccharide-23 02/29/2016  . Tdap 01/30/2017   Pertinent  Health Maintenance Due  Topic Date Due  . INFLUENZA VACCINE  Completed  . DEXA SCAN  Completed  . PNA vac Low Risk Adult  Completed   Fall Risk  02/06/2018 01/26/2017  Falls in the past year? No No   Functional Status Survey:    Vitals:   03/14/18 1209  BP: 122/81  Pulse: (!) 53  Resp: 17  Temp: (!) 97.3 F (36.3 C)  TempSrc: Oral  SpO2: 100%  Weight: 153 lb 9.6 oz (69.7 kg)  Height: 5\' 7"  (1.702 m)   Body mass index is  24.06 kg/m. Physical Exam   --In general this is a somewhat frail-appearing elderly female in no distress sitting in her wheelchair she is alert.  Her skin is warm and dry.  I do not really note any skin tears on areas of the arms I was able to examine   Eyes sclera and conjunctive are clear visual acuity appears to be intact.  Oropharynx was difficult to assess since patient did not follow verbal commands  Heart is slightly bradycardic without murmur gallop or rub it is regular she has trace lower extremity edema this looks to be more dependent related.  Abdomen is soft nontender with positive bowel sounds.  Musculoskeletal has generalized weakness holds her upper arms and somewhat of a contracted. Position.  Neurologic she is alert  Does not really follow verbal commands so complete assessment is difficult but could not really appreciate lateralizing findings.  She is not really speaking much  at all anymore.  Psych as noted above    Labs reviewed: Recent Labs    05/10/17 0800  02/07/18 0858 02/14/18 0702 02/21/18 0700  NA  --    < > 142 146* 145  K  --    < > 3.4* 3.4* 3.5  CL  --    < > 107 112* 113*  CO2  --    < > 29 25 24   GLUCOSE  --    < > 82 93 102*  BUN  --    < > 48* 48* 44*  CREATININE  --    < > 1.81* 1.71* 1.78*  CALCIUM  --    < > 9.3 9.7 9.6  MG 2.7*  --   --   --   --    < > = values in this interval not displayed.   Recent Labs    04/12/17 0710 07/24/17 1911 02/07/18 0858  AST 30 23 20   ALT 50 13* 23  ALKPHOS 72 79 69  BILITOT 0.7 0.5 0.5  PROT 7.7 7.0 6.8  ALBUMIN 4.0 3.6 3.5   Recent Labs    07/24/17 1911 08/10/17 0702 02/07/18 0858  WBC 8.2 6.5 5.3  NEUTROABS 7.2 4.5 3.3  HGB 10.0* 12.1 12.4  HCT 32.4* 38.8  37.2 38.7  MCV 92.6 90.4 91.7  PLT 129* 250 211   Lab Results  Component Value Date   TSH 0.823 04/10/2017   Lab Results  Component Value Date   HGBA1C  05/17/2007    5.4 (NOTE)   The ADA recommends the following  therapeutic goals for glycemic   control related to Hgb A1C measurement:   Goal of Therapy:   < 7.0% Hgb A1C   Action Suggested:  > 8.0% Hgb A1C   Ref:  Diabetes Care, 22, Suppl. 1, 1999   Lab Results  Component Value Date   CHOL  05/22/2007    175        ATP III CLASSIFICATION:  <200     mg/dL   Desirable  200-239  mg/dL   Borderline High  >=240    mg/dL   High   HDL 37 (L) 05/22/2007   LDLCALC  05/22/2007    96        Total Cholesterol/HDL:CHD Risk Coronary Heart Disease Risk Table                     Men   Women  1/2 Average Risk   3.4   3.3   TRIG 208 (H) 05/22/2007   CHOLHDL 4.7 05/22/2007    Significant Diagnostic Results in last 30 days:  No results found.  Assessment/Plan  #1-history of fever with vomiting-this appears to have resolved without aggressive intervention- at this point continue comfort care measures she has been restarted on Zofran twice daily every 8 hours as needed.  2.  History of advancing dementia this appears to be progressing- again her daughter has desired no aggressive interventions no lab work comfort care measures she does appear to be comfortable she does have an order for Tylenol twice daily and as needed.  Appears she is lost about 4 pounds in the past month this could be due in part to the recent fever and vomiting- but weight actually is still up about 3 pounds from what it was in July  3.  Hypertension this appears fairly stable on the Norvasc 7.5 mg a day.  4.  History of GERD  gastroparesis again she has been restarted on her Zofran-is no longer on Reglan- she is on Prilosec.  5.  History of breast cancer status post mastectomy again she is no longer on an oral agent secondary to desire for minimal medications since she is on comfort care.   #6 history of chronic kidney disease- last lab done in mid October showed a creatinine of 1.78 BUN of 44-electrolytes were within normal limits- again secondary to comfort measures her daughter has  requested no further labs at this time- p.o. intake is to be encouraged   [phone]

## 2018-03-19 ENCOUNTER — Encounter: Payer: Self-pay | Admitting: Internal Medicine

## 2018-03-19 ENCOUNTER — Non-Acute Institutional Stay (SKILLED_NURSING_FACILITY): Payer: Medicare Other | Admitting: Internal Medicine

## 2018-03-19 DIAGNOSIS — H109 Unspecified conjunctivitis: Secondary | ICD-10-CM | POA: Diagnosis not present

## 2018-03-19 DIAGNOSIS — R112 Nausea with vomiting, unspecified: Secondary | ICD-10-CM

## 2018-03-19 DIAGNOSIS — R627 Adult failure to thrive: Secondary | ICD-10-CM | POA: Diagnosis not present

## 2018-03-19 NOTE — Progress Notes (Signed)
This is an acute visit.  Level of care is skilled.  Facility is [facility].  Chief complaint acute visit secondary to red irritated eyes.  History of present illness.  Patient is an 82 year old female who is a long-term resident of the facility she has end-stage dementia as well as dysphasia and gastroparesis in addition to chronic kidney disease hypertension history of breast cancer.  Her daughter has noticed that she has had some increased redness of her eyes bilaterally recently-and apparently some drainage apparently this was clear drainage now has a small amount of exudate.  Appears her eyes do feel somewhat irritated-patient is a poor historian secondary to her severe dementia.  Past Medical History:  Diagnosis Date  . Cancer ([facility])   . Dementia ([facility])   . Gallstones   . Gastroparalysis   . HTN (hypertension)   . Infiltrating ductal carcinoma of right female breast ([facility]) 12/02/2009   Qualifier: History of  By: [name] CMA [name]), June    . Osteoporosis 11/18/2011  . Reflux   . Renal disorder    renal insufficiency.         Past Surgical History:  Procedure Laterality Date  . BREAST BIOPSY    . BREAST LUMPECTOMY    . COLONOSCOPY  2009   Dr. Fuller Plan: normal  . ESOPHAGOGASTRODUODENOSCOPY  12/11/2009   Dr. Fuller Plan: hiatal hernia  . ESOPHAGOGASTRODUODENOSCOPY N/A 01/29/2014   Procedure: ESOPHAGOGASTRODUODENOSCOPY (EGD);  Surgeon: [name], MD;  [facility];  Service: Endoscopy;  Laterality: N/A;  245  . TONSILLECTOMY AND ADENOIDECTOMY          Allergies  Allergen Reactions  . Aricept [Donepezil Hydrochloride] Nausea And Vomiting  . Exelon [Rivastigmine Tartrate]   . Lithium Nausea And Vomiting        Outpatient Encounter Medications as of 03/14/2018  Medication Sig  . acetaminophen (TYLENOL) 325 MG tablet Take two tablets by mouth twice a day, take 650 mg by mouth every 6 hours prn  . amLODipine (NORVASC) 2.5 MG tablet  Take 2.5 mg by mouth along with 5 mg to = 7.5 mg once a day  . amLODipine (NORVASC) 5 MG tablet Take 5 mg by mouth along with 2.5 mg to = 7.5 mg once a day  . docusate (COLACE) 50 MG/5ML liquid Give 10 ml by mouth one a day  . Multiple Vitamins-Minerals (MULTIVITAMINS/MINERALS ADULT) LIQD Take 5 mLs by mouth daily.  . Olopatadine HCl 0.2 % SOLN Place 1 drop into both eyes daily as needed.  [name] omeprazole (PRILOSEC) 20 MG capsule Take 20 mg by mouth daily.  . ondansetron (ZOFRAN) 4 MG tablet Take 4 mg by mouth 2 (two) times daily.  . ondansetron (ZOFRAN) 4 MG tablet Take 4 mg by mouth every 8 (eight) hours as needed for nausea or vomiting.  . polyethylene glycol (MIRALAX / GLYCOLAX) packet Take 17 g by mouth daily.  [name] senna-docusate (SENOKOT-S) 8.6-50 MG tablet Take 2 tablets by mouth at bedtime.   . [DISCONTINUED] ondansetron (ZOFRAN) 4 MG tablet Take 4 mg by mouth every 8 (eight) hours as needed for nausea or vomiting.   No facility-administered encounter medications on file as of 03/14/2018.    Review of systems.  Unobtainable secondary to dementia please see HPI.  Physical exam.  She is afebrile pulse is 60 respirations of 16.  In general this is a somewhat frail elderly female in no distress her daughter is helping to feed her this evening.  Her skin is  warm and dry she is not diaphoretic.  Eyes she does have erythematous conjunctive a bilaterally and appears to have some watery presentation-there is a very minimal amount of whitish-brownish exudate on the lower eyelid on the left.  Visual acuity appears to be intact.  Chest is clear to auscultation there is no labored breathing there is poor effort with patient not following verbal commands.  Heart is regular rate and rhythm without murmur gallop or rub she has mild what appears to be dependent lower extremity edema.  Neurologic she is alert and at baseline.  Psych again she does not follow verbal commands she is not agitated  with exam findings consistent with advanced dementia.  Labs.  February 21, 2018.  Sodium 145 potassium 3.5 BUN 44 creatinine 1.78.  February 07, 2018.  WBC 5.3 hemoglobin 12.4 platelets 211.  Assessment plan.  Conjunctivitis unspecified- this appears to be more of an allergy type conjunctivitis--appears she ihas done well with Patanol per review of previous notes-in the past will prescribe 1 drop Patanol twice daily for 7 days and monitor  #2 failure to thrive with end-stage dementia she continues under comfort care per her daughter she appears to be stable in this regards is drinking well with spotty p.o. intake-she does have a previous history of dysphasia and gastroparesis but this appears stabilized- she is currently on Zofran 4 mg twice daily in addition to Prilosec.  [id number]

## 2018-04-17 ENCOUNTER — Encounter: Payer: Self-pay | Admitting: Internal Medicine

## 2018-04-17 ENCOUNTER — Non-Acute Institutional Stay (SKILLED_NURSING_FACILITY): Payer: Medicare Other | Admitting: Internal Medicine

## 2018-04-17 DIAGNOSIS — F0281 Dementia in other diseases classified elsewhere with behavioral disturbance: Secondary | ICD-10-CM

## 2018-04-17 DIAGNOSIS — C50911 Malignant neoplasm of unspecified site of right female breast: Secondary | ICD-10-CM | POA: Diagnosis not present

## 2018-04-17 DIAGNOSIS — I10 Essential (primary) hypertension: Secondary | ICD-10-CM

## 2018-04-17 DIAGNOSIS — G300 Alzheimer's disease with early onset: Secondary | ICD-10-CM | POA: Diagnosis not present

## 2018-04-17 DIAGNOSIS — K3184 Gastroparesis: Secondary | ICD-10-CM | POA: Diagnosis not present

## 2018-04-17 DIAGNOSIS — N289 Disorder of kidney and ureter, unspecified: Secondary | ICD-10-CM | POA: Diagnosis not present

## 2018-04-17 NOTE — Progress Notes (Signed)
Location:    [facility] Room Number: 110/W Place of Service:  SNF [phone]) Provider: Granville Lewis PA-C  [name], MD  Patient Care Team: [name], MD as PCP - General (Internal Medicine) [name], [name], MD as Consulting Physician (Gastroenterology)  Extended Emergency Contact Information Primary Emergency Contact: [name] Address: [address]          [place], [place] [address] [name] of [place] Phone: [phone] Work Phone: [phone] Mobile Phone: [phone] Relation: Daughter Secondary Emergency Contact: [name] Address: [address]          [place], [place] [address] [name] of [place] Phone: [phone] Mobile Phone: [phone] Relation: Relative  Code Status:  DNR Goals of care: Advanced Directive information Advanced Directives 04/17/2018  Does Patient Have a Medical Advance Directive? Yes  Type of Advance Directive Out of facility DNR (pink MOST or yellow form)  Does patient want to make changes to medical advance directive? No - Patient declined  Copy of [place] in Chart? No - copy requested  Would patient like information on creating a medical advance directive? No - Patient declined  Pre-existing out of facility DNR order (yellow form or pink MOST form) -     Chief Complaint  Patient presents with  . Medical Management of Chronic Issues    Routine visit of medical management  Medical management of chronic medical conditions including end-stage dementia-gastroparesis-chronic kidney disease as well as hypertension and recent conjunctivitis  HPI:  Pt is a 82 y.o. female seen today for medical management of chronic diseases.  As noted above.  She does have a history of progressing dementia but does well with supportive care her appetite continues to be spotty but her daughter who is actually dietitian in the facility is very supportive nursing staff has been pushing fluids  and p.o. intake as well.  This is complicated somewhat with a history of gastroparesis she is currently on Zofran twice daily and as needed every 8 hours as well as Prilosec at one point had been on Reglan but this was discontinued because of lethargy apparently is stabilized despite being off the Reglan she had lost some weight but actually most recent weight shows relative stability and 154 pounds.  She does have a history of hypertension this appears stable on Norvasc 7.5 mg a day.  She also was seen recently for conjunctivitis this was thought to be more allergic type and did respond to a course of Patanol- her eyes appear to be clear today.  She cannot really give [place] any review of systems because of her significant dementia but she is actually talking more than I have seen recently although she is nonsensical   Past Medical History:  Diagnosis Date  . Cancer ([facility])   . Dementia ([facility])   . Gallstones   . Gastroparalysis   . HTN (hypertension)   . Infiltrating ductal carcinoma of right female breast ([facility]) 12/02/2009   Qualifier: History of  By: [name] CMA [name]), June    . Osteoporosis 11/18/2011  . Reflux   . Renal disorder    renal insufficiency.    Past Surgical History:  Procedure Laterality Date  . BREAST BIOPSY    . BREAST LUMPECTOMY    . COLONOSCOPY  2009   Dr. Fuller Plan: normal  . ESOPHAGOGASTRODUODENOSCOPY  12/11/2009   Dr. Fuller Plan: hiatal hernia  . ESOPHAGOGASTRODUODENOSCOPY N/A 01/29/2014   Procedure: ESOPHAGOGASTRODUODENOSCOPY (EGD);  Surgeon: [name], MD;  [facility];  Service: Endoscopy;  Laterality: N/A;  245  . TONSILLECTOMY AND ADENOIDECTOMY      Allergies  Allergen Reactions  . Aricept [Donepezil Hydrochloride] Nausea And Vomiting  . Exelon [Rivastigmine Tartrate]   . Lithium Nausea And Vomiting    Outpatient Encounter Medications as of 04/17/2018  Medication Sig  . acetaminophen (TYLENOL) 325 MG tablet Take two tablets by mouth twice a  day, take 650 mg by mouth every 6 hours prn  . acetaminophen (TYLENOL) 325 MG tablet Take 650 mg by mouth 2 (two) times daily.  [name] amLODipine (NORVASC) 2.5 MG tablet Take 2.5 mg by mouth along with 5 mg to = 7.5 mg once a day  . amLODipine (NORVASC) 5 MG tablet Take 5 mg by mouth along with 2.5 mg to = 7.5 mg once a day  . docusate (COLACE) 50 MG/5ML liquid Give 10 ml by mouth one a day  . Multiple Vitamins-Minerals (MULTIVITAMINS/MINERALS ADULT) LIQD Take 5 mLs by mouth daily.  . Olopatadine HCl 0.2 % SOLN Place 1 drop into both eyes daily as needed.  [name] omeprazole (PRILOSEC) 20 MG capsule Take 20 mg by mouth daily.  . ondansetron (ZOFRAN) 4 MG tablet Take 4 mg by mouth 2 (two) times daily.  . ondansetron (ZOFRAN) 4 MG tablet Take 4 mg by mouth every 8 (eight) hours as needed for nausea or vomiting.  . polyethylene glycol (MIRALAX / GLYCOLAX) packet Take 17 g by mouth daily.  [name] senna-docusate (SENOKOT-S) 8.6-50 MG tablet Take 2 tablets by mouth at bedtime.    No facility-administered encounter medications on file as of 04/17/2018.      Review of Systems   Obtainable secondary to dementia  Immunization History  Administered Date(s) Administered  . Influenza-Unspecified 02/13/2014, 02/09/2016, 02/10/2017  . Pneumococcal Conjugate-13 02/10/2017  . Pneumococcal Polysaccharide-23 02/29/2016  . Tdap 01/30/2017   Pertinent  Health Maintenance Due  Topic Date Due  . INFLUENZA VACCINE  Completed  . DEXA SCAN  Completed  . PNA vac Low Risk Adult  Completed   Fall Risk  02/06/2018 01/26/2017  Falls in the past year? No No   Functional Status Survey:    Vitals:   04/17/18 1450  BP: 126/70  Pulse: 70  Resp: 20  Temp: 97.6 F (36.4 C)  TempSrc: Oral  SpO2: 95%  Weight: 154 lb 9.6 oz (70.1 kg)  Height: 5\' 7"  (1.702 m)   Body mass index is 24.21 kg/m. Physical Exam   In general this is a fairly well-nourished elderly female she appears a bit more alert than I have seen her  recently she is talking some.  Her skin is warm and dry.  Eyes sclera and conjunctive are clear visual acuity appears to be intact.  Oropharynx from what I could ascertain appear to be clear with fairly moist mucous membranes.  Heart is regular rate and rhythm without murmur gallop or rub she has trace lower extremity edema this appears to be more dependent related.  Abdomen is soft nontender with positive bowel sounds.  Musculoskeletal Limited exam since she does not follow verbal commands but upper extremity strength appears to be intact with baseline lower extremity weakness.  At times will hold her upper extremities and somewhat of a contracted position   neurologic appears grossly intact her speech is nonsensical but she is speaking more than I have seen recently cannot really appreciate true lateralizing findings.  And psych findings consistent with significant dementia she does not follow  verbal commands she is not really agitated with exam however  Labs reviewed: Recent Labs    05/10/17 0800  02/07/18 0858 02/14/18 0702 02/21/18 0700  NA  --    < > 142 146* 145  K  --    < > 3.4* 3.4* 3.5  CL  --    < > 107 112* 113*  CO2  --    < > 29 25 24   GLUCOSE  --    < > 82 93 102*  BUN  --    < > 48* 48* 44*  CREATININE  --    < > 1.81* 1.71* 1.78*  CALCIUM  --    < > 9.3 9.7 9.6  MG 2.7*  --   --   --   --    < > = values in this interval not displayed.   Recent Labs    07/24/17 1911 02/07/18 0858  AST 23 20  ALT 13* 23  ALKPHOS 79 69  BILITOT 0.5 0.5  PROT 7.0 6.8  ALBUMIN 3.6 3.5   Recent Labs    07/24/17 1911 08/10/17 0702 02/07/18 0858  WBC 8.2 6.5 5.3  NEUTROABS 7.2 4.5 3.3  HGB 10.0* 12.1 12.4  HCT 32.4* 38.8  37.2 38.7  MCV 92.6 90.4 91.7  PLT 129* 250 211   Lab Results  Component Value Date   TSH 0.823 04/10/2017   Lab Results  Component Value Date   HGBA1C  05/17/2007    5.4 (NOTE)   The ADA recommends the following therapeutic goals for  glycemic   control related to Hgb A1C measurement:   Goal of Therapy:   < 7.0% Hgb A1C   Action Suggested:  > 8.0% Hgb A1C   Ref:  Diabetes Care, 22, Suppl. 1, 1999   Lab Results  Component Value Date   CHOL  05/22/2007    175        ATP III CLASSIFICATION:  <200     mg/dL   Desirable  200-239  mg/dL   Borderline High  >=240    mg/dL   High   HDL 37 (L) 05/22/2007   LDLCALC  05/22/2007    96        Total Cholesterol/HDL:CHD Risk Coronary Heart Disease Risk Table                     Men   Women  1/2 Average Risk   3.4   3.3   TRIG 208 (H) 05/22/2007   CHOLHDL 4.7 05/22/2007    Significant Diagnostic Results in last 30 days:  No results found.  Assessment/Plan  #1 history of end-stage dementia her weight appears to be stabilized at this point continue supportive care we have minimize medication secondary to her progressive dementia and her daughters desires for minimal medications.  2.  Gastroparesis this appears to have achieved a period of stability she is on the Zofran routinely twice a day and Prilosec she is no longer on Reglan because of sedation concerns.  3.  History of conjunctivitis this appears to have cleared up status post Patanol use.  4 history of chronic kidney disease last creatinine was 1.78 BUN of 44 on October lab at this point we have minimized medication secondary to family wishes to minimize blood draws we will continue to monitor as needed.  5.  History of hypertension this appears stable on Norvasc.  6.  History of breast cancer she is no longer  on any medication secondary family wishes for minimal medications with no aggressive follow-up.  7.  She does have a history of fever of unknown origin thought at times to be aspiration but at this point she appears to be stable at times will respond to conservative treatment at times has had an antibiotic and does well with this- but at this point she appears to be stable  [id number]

## 2018-05-04 ENCOUNTER — Encounter: Payer: Self-pay | Admitting: Internal Medicine

## 2018-05-04 ENCOUNTER — Non-Acute Institutional Stay (SKILLED_NURSING_FACILITY): Payer: Medicare Other | Admitting: Internal Medicine

## 2018-05-04 DIAGNOSIS — S0181XA Laceration without foreign body of other part of head, initial encounter: Secondary | ICD-10-CM | POA: Diagnosis not present

## 2018-05-04 NOTE — Progress Notes (Signed)
Location:    [facility] Room Number: 110/W Place of Service:  SNF [phone]) Provider:  Freddi Starr, MD  Patient Care Team: [name], MD as PCP - General (Internal Medicine) [name], [name], MD as Consulting Physician (Gastroenterology)  Extended Emergency Contact Information Primary Emergency Contact: [name] Address: [address]          [place], [place] [address] [name] of [place] Phone: [phone] Work Phone: [phone] Mobile Phone: [phone] Relation: Daughter Secondary Emergency Contact: [name] Address: [address]          [place], [place] [address] [name] of [place] Phone: [phone] Mobile Phone: [phone] Relation: Relative  Code Status:  DNR Goals of care: Advanced Directive information Advanced Directives 05/04/2018  Does Patient Have a Medical Advance Directive? Yes  Type of Advance Directive Out of facility DNR (pink MOST or yellow form)  Does patient want to make changes to medical advance directive? No - Patient declined  Copy of [place] in Chart? No - copy requested  Would patient like information on creating a medical advance directive? No - Patient declined  Pre-existing out of facility DNR order (yellow form or pink MOST form) -     Chief complaint acute visit secondary to patient bumping her head on lift and subsequent bleeding  HPI:  Pt is a 82 y.o. female seen today for an acute visit for follow-up of a head wound.  Patient does have a history of significant dementia and was receiving personal care in the shower apparently she was on the left and made a sudden movement with her head contact with part of the left and did sustain a small cut to her forehead with bleeding.  Apparently there was a bit of bleeding initially but by the time I got there it had stopped largely- she did have a small cut in her forehead but this did not appear to be  dramatic.  There was no loss of consciousness vital signs are stable status appears to be baseline she is bright alert confused  I do note she is on fairly minimal medications she is not on any blood thinner.  In addition to dementia her other conditions include gastroparesis chronic kidney disease hypertension.     Past Medical History:  Diagnosis Date  . Cancer ([place])   . Dementia ([place])   . Gallstones   . Gastroparalysis   . HTN (hypertension)   . Infiltrating ductal carcinoma of right female breast ([place]) 12/02/2009   Qualifier: History of  By: [name] CMA [name]), June    . Osteoporosis 11/18/2011  . Reflux   . Renal disorder    renal insufficiency.    Past Surgical History:  Procedure Laterality Date  . BREAST BIOPSY    . BREAST LUMPECTOMY    . COLONOSCOPY  2009   Dr. Fuller Plan: normal  . ESOPHAGOGASTRODUODENOSCOPY  12/11/2009   Dr. Fuller Plan: hiatal hernia  . ESOPHAGOGASTRODUODENOSCOPY N/A 01/29/2014   Procedure: ESOPHAGOGASTRODUODENOSCOPY (EGD);  Surgeon: [name], MD;  [facility];  Service: Endoscopy;  Laterality: N/A;  245  . TONSILLECTOMY AND ADENOIDECTOMY      Allergies  Allergen Reactions  . Aricept [Donepezil Hydrochloride] Nausea And Vomiting  . Exelon [Rivastigmine Tartrate]   . Lithium Nausea And Vomiting    Outpatient Encounter Medications as of 05/04/2018  Medication Sig  . acetaminophen (TYLENOL) 325 MG tablet Take two tablets by mouth twice a day,  take 650 mg by mouth every 6 hours prn  . acetaminophen (TYLENOL) 325 MG tablet Take 650 mg by mouth 2 (two) times daily.  [name] amLODipine (NORVASC) 2.5 MG tablet Take 2.5 mg by mouth along with 5 mg to = 7.5 mg once a day  . amLODipine (NORVASC) 5 MG tablet Take 5 mg by mouth along with 2.5 mg to = 7.5 mg once a day  . docusate (COLACE) 50 MG/5ML liquid Give 10 ml by mouth one a day  . Multiple Vitamins-Minerals (MULTIVITAMINS/MINERALS ADULT) LIQD Take 5 mLs by mouth daily.  . Olopatadine HCl 0.2  % SOLN Place 1 drop into both eyes daily as needed.  [name] omeprazole (PRILOSEC) 20 MG capsule Take 20 mg by mouth daily.  . ondansetron (ZOFRAN) 4 MG tablet Take 4 mg by mouth 2 (two) times daily.  . ondansetron (ZOFRAN) 4 MG tablet Take 4 mg by mouth every 8 (eight) hours as needed for nausea or vomiting.  . polyethylene glycol (MIRALAX / GLYCOLAX) packet Take 17 g by mouth daily.  [name] senna-docusate (SENOKOT-S) 8.6-50 MG tablet Take 2 tablets by mouth at bedtime.    No facility-administered encounter medications on file as of 05/04/2018.     Review of Systems   Is unobtainable secondary to dementia please see HPI  Immunization History  Administered Date(s) Administered  . Influenza-Unspecified 02/13/2014, 02/09/2016, 02/10/2017  . Pneumococcal Conjugate-13 02/10/2017  . Pneumococcal Polysaccharide-23 02/29/2016  . Tdap 01/30/2017   Pertinent  Health Maintenance Due  Topic Date Due  . INFLUENZA VACCINE  Completed  . DEXA SCAN  Completed  . PNA vac Low Risk Adult  Completed   Fall Risk  02/06/2018 01/26/2017  Falls in the past year? No No   Functional Status Survey:    She is afebrile pulse is 60 respirations 18 blood pressure 130/80 oxygen saturation is 90% on room air  Physical Exam   In general this is a fairly well-nourished elderly female in no distress she is sitting in the shower room- I cannot really appreciate any active bleeding from the forehead- she does have some blood on her clothing.  Her skin is warm and dry there is a small cut with a small amount of elevation to her mid forehead area.  Eyes sclera and conjunctive are clear visual acuity appears to be at baseline she does make eye contact.  Oropharynx is clear mucous membranes moist tongue is midline.  Chest is clear to auscultation she does not really follow verbal commands but cannot appreciate any labored breathing or congestion.  Heart is regular rate and rhythm with an occasional irregular beat she has  quite minimal lower extremity edema.  Abdomen is soft nontender with positive bowel sounds.  Musculoskeletal appears to move her extremities at baseline I do not really note any changes.  Neurologic as noted above she is alert she does make eye contact she appears to be at baseline at times will speak somewhat incoherently but this is not new.  Psych findings consistent with baseline.  Dementia    Labs reviewed: Recent Labs    05/10/17 0800  02/07/18 0858 02/14/18 0702 02/21/18 0700  NA  --    < > 142 146* 145  K  --    < > 3.4* 3.4* 3.5  CL  --    < > 107 112* 113*  CO2  --    < > 29 25 24   GLUCOSE  --    < > 82 93  102*  BUN  --    < > 48* 48* 44*  CREATININE  --    < > 1.81* 1.71* 1.78*  CALCIUM  --    < > 9.3 9.7 9.6  MG 2.7*  --   --   --   --    < > = values in this interval not displayed.   Recent Labs    07/24/17 1911 02/07/18 0858  AST 23 20  ALT 13* 23  ALKPHOS 79 69  BILITOT 0.5 0.5  PROT 7.0 6.8  ALBUMIN 3.6 3.5   Recent Labs    07/24/17 1911 08/10/17 0702 02/07/18 0858  WBC 8.2 6.5 5.3  NEUTROABS 7.2 4.5 3.3  HGB 10.0* 12.1 12.4  HCT 32.4* 38.8  37.2 38.7  MCV 92.6 90.4 91.7  PLT 129* 250 211   Lab Results  Component Value Date   TSH 0.823 04/10/2017   Lab Results  Component Value Date   HGBA1C  05/17/2007    5.4 (NOTE)   The ADA recommends the following therapeutic goals for glycemic   control related to Hgb A1C measurement:   Goal of Therapy:   < 7.0% Hgb A1C   Action Suggested:  > 8.0% Hgb A1C   Ref:  Diabetes Care, 22, Suppl. 1, 1999   Lab Results  Component Value Date   CHOL  05/22/2007    175        ATP III CLASSIFICATION:  <200     mg/dL   Desirable  200-239  mg/dL   Borderline High  >=240    mg/dL   High   HDL 37 (L) 05/22/2007   LDLCALC  05/22/2007    96        Total Cholesterol/HDL:CHD Risk Coronary Heart Disease Risk Table                     Men   Women  1/2 Average Risk   3.4   3.3   TRIG 208 (H) 05/22/2007    CHOLHDL 4.7 05/22/2007    Significant Diagnostic Results in last 30 days:  No results found.  Assessment/Plan  #1- cut to forehead with bleeding- again this appears to have had transitory bleeding yshe does have a small cut to her forehead she does not do well with tape secondary to fragile skin will try Steri-Strips and monitor.  Continue protocol for neuro checks  she did hit her head.  But clinically she appears stable and at her baseline vital signs are reassuring status is at baseline I did discuss these findings with her daughter via phone  [phone]

## 2018-05-07 ENCOUNTER — Non-Acute Institutional Stay (SKILLED_NURSING_FACILITY): Payer: Medicare Other | Admitting: Internal Medicine

## 2018-05-07 ENCOUNTER — Encounter: Payer: Self-pay | Admitting: Internal Medicine

## 2018-05-07 DIAGNOSIS — R52 Pain, unspecified: Secondary | ICD-10-CM | POA: Diagnosis not present

## 2018-05-07 DIAGNOSIS — M546 Pain in thoracic spine: Secondary | ICD-10-CM | POA: Diagnosis not present

## 2018-05-07 DIAGNOSIS — R079 Chest pain, unspecified: Secondary | ICD-10-CM | POA: Diagnosis not present

## 2018-05-07 DIAGNOSIS — R109 Unspecified abdominal pain: Secondary | ICD-10-CM | POA: Diagnosis not present

## 2018-05-07 DIAGNOSIS — M545 Low back pain: Secondary | ICD-10-CM | POA: Diagnosis not present

## 2018-05-07 DIAGNOSIS — K5900 Constipation, unspecified: Secondary | ICD-10-CM | POA: Diagnosis not present

## 2018-05-07 NOTE — Progress Notes (Signed)
.  This is an acute visit.  Level care skilled.  Facility is [facility].  Chief complaint-acute visit secondary to possible generalized pain.  History of present illness.  Patient is an 82 year old female who is a long-term resident of the facility.  Who her daughter is concerned that she is having some pain and wonders if it might be related to a recent incident where patient sustained a small head wound after coming in contact with the lift they were using shower.  Daughter feels the pain is more in her back or possibly abdomen or chest when she takes a deep breath  Patient has a history of significant dementia as well as gastroparesis- hypertension- chronic kidney disease.  She is under largely comfort measures.  We have ordered x-rays including x-rays of her chest back and abdomen-all these appear to be negative for any acute process except for some moderate constipation.  On exam this evening and he appears to be at her baseline at this point does not appear to be in significant pain she is confused and does not really respond to any verbal commands vital signs are stable.       Past Medical History:  Diagnosis Date  . Cancer ([place] [facility])   . Dementia ([place])   . Gallstones   . Gastroparalysis   . HTN (hypertension)   . Infiltrating ductal carcinoma of right female breast ([facility]) 12/02/2009   Qualifier: History of  By: [name] CMA [name]), June    . Osteoporosis 11/18/2011  . Reflux   . Renal disorder    renal insufficiency.         Past Surgical History:  Procedure Laterality Date  . BREAST BIOPSY    . BREAST LUMPECTOMY    . COLONOSCOPY  2009   Dr. Fuller Plan: normal  . ESOPHAGOGASTRODUODENOSCOPY  12/11/2009   Dr. Fuller Plan: hiatal hernia  . ESOPHAGOGASTRODUODENOSCOPY N/A 01/29/2014   Procedure: ESOPHAGOGASTRODUODENOSCOPY (EGD);  Surgeon: [name], MD;  [facility];  Service: Endoscopy;  Laterality: N/A;  245  . TONSILLECTOMY AND ADENOIDECTOMY           Allergies  Allergen Reactions  . Aricept [Donepezil Hydrochloride] Nausea And Vomiting  . Exelon [Rivastigmine Tartrate]   . Lithium Nausea And Vomiting    MEDICATIONS  Medication Sig  . acetaminophen (TYLENOL) 325 MG tablet Take two tablets by mouth twice a day, take 650 mg by mouth every 6 hours prn  . acetaminophen (TYLENOL) 325 MG tablet Take 650 mg by mouth 2 (two) times daily.  [name] amLODipine (NORVASC) 2.5 MG tablet Take 2.5 mg by mouth along with 5 mg to = 7.5 mg once a day  . amLODipine (NORVASC) 5 MG tablet Take 5 mg by mouth along with 2.5 mg to = 7.5 mg once a day  . docusate (COLACE) 50 MG/5ML liquid Give 10 ml by mouth one a day  . Multiple Vitamins-Minerals (MULTIVITAMINS/MINERALS ADULT) LIQD Take 5 mLs by mouth daily.  . Olopatadine HCl 0.2 % SOLN Place 1 drop into both eyes daily as needed.  [name] omeprazole (PRILOSEC) 20 MG capsule Take 20 mg by mouth daily.  . ondansetron (ZOFRAN) 4 MG tablet Take 4 mg by mouth 2 (two) times daily.  . ondansetron (ZOFRAN) 4 MG tablet Take 4 mg by mouth every 8 (eight) hours as needed for nausea or vomiting.  . polyethylene glycol (MIRALAX / GLYCOLAX) packet Take 17 g by mouth daily.  [name] senna-docusate (SENOKOT-S) 8.6-50 MG tablet Take 2 tablets  by mouth at bedtime.    No facility-administered encounter medications on file as of 05/04/2018.    Review of systems.  This is unobtainable secondary to dementia please see HPI   Physical exam.  Temperature is 98.6 pulse 70 respirations 19 blood pressure 135/79 oxygen saturation is 96% on room air General this is a well-nourished elderly female in no distress lying comfortably in bed she is confused but this is her baseline-she does have Steri-Strips over her mid forehead wound I do not see any sign of infection.  Eyes visual acuity appears to be intact sclera and conjunctive are clear.  Oropharynx appears to be clear mucous membranes moist.  Chest is clear to  auscultation with poor respiratory effort I cannot appreciate any labored breathing or sign of distress.  Abdomen is soft does not appear tender she grimaces a little bit but this is not really beyond baseline bowel sounds are positive.  Musculoskeletal not really follow verbal commands- does appear to move her extremities at baseline cannot really appreciate any deformities other than arthritic she does hold her upper extremities and somewhat of a contracted position.--Could not really appreciate pain with gentle range of motion of her upper and lower extremities  Neurologic appears grossly intact she does speak somewhat nonsensically he does make eye contact.  Psych findings consistent with significant dementia as noted above  Labs. February 21 2018.  Sodium 145 potassium 3.5 BUN 44 creatinine 1.78  19.  WBC 5.3 hemoglobin 12.4 platelets 211   Assessment and plan.  1.  Pain-at this point she appears fairly comfortable x-rays were reassuring as well as physical exam- she does have Tylenol as needed for pain at this point will monitor- I did discuss x-ray findings with her daughter via phone and she expressed understanding.  We will continue to monitor encourage PRN Tylenol possibly may need tramadol if Tylenol is not effective  #2- constipation-she is on Senna 2 tabs nightly she also receives MiraLAX and liquid Colace during the day- have advised staff to possibly give her an additional dose of MiraLAX and monitor-it appears she is having fairly regular bowel movements per chart review  [id number]-of note greater than 25 minutes spent assessing patient discussing her status with nursing staff reviewing x-ray results and discussing her status with her daughter via phone- of note greater than 50% of time spent coordinating a plan of care with input as noted above

## 2018-05-09 ENCOUNTER — Other Ambulatory Visit (HOSPITAL_COMMUNITY)
Admission: RE | Admit: 2018-05-09 | Discharge: 2018-05-09 | Disposition: A | Discharge status: Home / Self Care | Payer: Medicare Other | Source: Skilled Nursing Facility | Attending: Internal Medicine | Admitting: Internal Medicine

## 2018-05-09 DIAGNOSIS — Z859 Personal history of malignant neoplasm, unspecified: Secondary | ICD-10-CM | POA: Diagnosis not present

## 2018-05-09 LAB — CBC WITH DIFFERENTIAL/PLATELET
Abs Immature Granulocytes: 0.02 10*3/uL (ref 0.00–0.07)
BASOS PCT: 1 %
Basophils Absolute: 0 10*3/uL (ref 0.0–0.1)
Eosinophils Absolute: 0.3 10*3/uL (ref 0.0–0.5)
Eosinophils Relative: 4 %
HCT: 43.8 % (ref 36.0–46.0)
Hemoglobin: 13.4 g/dL (ref 12.0–15.0)
IMMATURE GRANULOCYTES: 0 %
LYMPHS ABS: 1.1 10*3/uL (ref 0.7–4.0)
LYMPHS PCT: 14 %
MCH: 28.8 pg (ref 26.0–34.0)
MCHC: 30.6 g/dL (ref 30.0–36.0)
MCV: 94.2 fL (ref 80.0–100.0)
MONO ABS: 0.7 10*3/uL (ref 0.1–1.0)
MONOS PCT: 10 %
NEUTROS PCT: 71 %
Neutro Abs: 5.3 10*3/uL (ref 1.7–7.7)
PLATELETS: 223 10*3/uL (ref 150–400)
RBC: 4.65 MIL/uL (ref 3.87–5.11)
RDW: 13.7 % (ref 11.5–15.5)
WBC: 7.4 10*3/uL (ref 4.0–10.5)
nRBC: 0 % (ref 0.0–0.2)

## 2018-05-09 LAB — BASIC METABOLIC PANEL
ANION GAP: 7 (ref 5–15)
BUN: 57 mg/dL — ABNORMAL HIGH (ref 8–23)
CHLORIDE: 107 mmol/L (ref 98–111)
CO2: 29 mmol/L (ref 22–32)
Calcium: 9.5 mg/dL (ref 8.9–10.3)
Creatinine, Ser: 2.12 mg/dL — ABNORMAL HIGH (ref 0.44–1.00)
GFR calc Af Amer: 25 mL/min — ABNORMAL LOW (ref >60)
GFR, EST NON AFRICAN AMERICAN: 21 mL/min — AB (ref >60)
GLUCOSE: 91 mg/dL (ref 70–99)
POTASSIUM: 4.2 mmol/L (ref 3.5–5.1)
Sodium: 143 mmol/L (ref 135–145)

## 2018-06-14 ENCOUNTER — Non-Acute Institutional Stay (SKILLED_NURSING_FACILITY): Payer: Medicare Other | Admitting: Internal Medicine

## 2018-06-14 ENCOUNTER — Encounter: Payer: Self-pay | Admitting: Internal Medicine

## 2018-06-14 DIAGNOSIS — I10 Essential (primary) hypertension: Secondary | ICD-10-CM | POA: Diagnosis not present

## 2018-06-14 DIAGNOSIS — B351 Tinea unguium: Secondary | ICD-10-CM | POA: Diagnosis not present

## 2018-06-14 DIAGNOSIS — K3184 Gastroparesis: Secondary | ICD-10-CM | POA: Diagnosis not present

## 2018-06-14 DIAGNOSIS — G300 Alzheimer's disease with early onset: Secondary | ICD-10-CM

## 2018-06-14 DIAGNOSIS — L603 Nail dystrophy: Secondary | ICD-10-CM | POA: Diagnosis not present

## 2018-06-14 DIAGNOSIS — R112 Nausea with vomiting, unspecified: Secondary | ICD-10-CM | POA: Diagnosis not present

## 2018-06-14 DIAGNOSIS — F0281 Dementia in other diseases classified elsewhere with behavioral disturbance: Secondary | ICD-10-CM | POA: Diagnosis not present

## 2018-06-14 DIAGNOSIS — I739 Peripheral vascular disease, unspecified: Secondary | ICD-10-CM | POA: Diagnosis not present

## 2018-06-14 DIAGNOSIS — M2041 Other hammer toe(s) (acquired), right foot: Secondary | ICD-10-CM | POA: Diagnosis not present

## 2018-06-14 DIAGNOSIS — F02818 Dementia in other diseases classified elsewhere, unspecified severity, with other behavioral disturbance: Secondary | ICD-10-CM

## 2018-06-14 DIAGNOSIS — M2042 Other hammer toe(s) (acquired), left foot: Secondary | ICD-10-CM | POA: Diagnosis not present

## 2018-06-14 NOTE — Progress Notes (Signed)
Location:    [facility] Room Number: 110/W Place of Service:  SNF [phone]) Provider:  Veleta Miners MD  [name], MD  Patient Care Team: [name], MD as PCP - General (Internal Medicine) [name], [name], MD as Consulting Physician (Gastroenterology)  Extended Emergency Contact Information Primary Emergency Contact: [facility] Address: [address]          [place], [place] [address] [name] of [place] Phone: [phone] Work Phone: [phone] Mobile Phone: [phone] Relation: Daughter Secondary Emergency Contact: [name] Address: [address]          [place], [place] [address] [name] of [place] Phone: [phone] Mobile Phone: [phone] Relation: Relative  Code Status:  DNR Goals of care: Advanced Directive information Advanced Directives 06/14/2018  Does Patient Have a Medical Advance Directive? Yes  Type of Advance Directive Out of facility DNR (pink MOST or yellow form)  Does patient want to make changes to medical advance directive? No - Patient declined  Copy of [place] in Chart? No - copy requested  Would patient like information on creating a medical advance directive? No - Patient declined  Pre-existing out of facility DNR order (yellow form or pink MOST form) -     Chief Complaint  Patient presents with  . Medical Management of Chronic Issues    Routine visit of medical management    HPI:  Pt is a 83 y.o. female seen today for medical management of chronic diseases.    Patient has h/o hypertension, Advanced Dementia, CKD stge 4 , Breast cancer s/p Mastectomy, GERD and Gastroparesis. Patient is Long term Resident of facility. She is stable . Unable to give me any history. She has severe Dementia and for past few months she has declined further and sleeps most of the time. Doesn't respond or follow any commands.Her Daughter has decided for no Further work up. She is comfort  Care. D/w Nurses no Acute issues. Her weight is slightly down 6 lbs to 151 lbs since 10/19 Past Medical History:  Diagnosis Date  . Cancer ([place])   . Dementia ([place])   . Gallstones   . Gastroparalysis   . HTN (hypertension)   . Infiltrating ductal carcinoma of right female breast ([place]) 12/02/2009   Qualifier: History of  By: [name] CMA [name]), June    . Osteoporosis 11/18/2011  . Reflux   . Renal disorder    renal insufficiency.    Past Surgical History:  Procedure Laterality Date  . BREAST BIOPSY    . BREAST LUMPECTOMY    . COLONOSCOPY  2009   Dr. Fuller Plan: normal  . ESOPHAGOGASTRODUODENOSCOPY  12/11/2009   Dr. Fuller Plan: hiatal hernia  . ESOPHAGOGASTRODUODENOSCOPY N/A 01/29/2014   Procedure: ESOPHAGOGASTRODUODENOSCOPY (EGD);  Surgeon: [name], MD;  [facility];  Service: Endoscopy;  Laterality: N/A;  245  . TONSILLECTOMY AND ADENOIDECTOMY      Allergies  Allergen Reactions  . Aricept [Donepezil Hydrochloride] Nausea And Vomiting  . Exelon [Rivastigmine Tartrate]   . Lithium Nausea And Vomiting    Outpatient Encounter Medications as of 06/14/2018  Medication Sig  . acetaminophen (TYLENOL) 325 MG tablet Take two tablets by mouth twice a day, take 650 mg by mouth every 6 hours prn  . acetaminophen (TYLENOL) 325 MG tablet Take 650 mg by mouth 2 (two) times daily.  [name] amLODipine (NORVASC) 2.5 MG tablet Take 2.5 mg by mouth along with 5 mg to = 7.5 mg  once a day  . amLODipine (NORVASC) 5 MG tablet Take 5 mg by mouth along with 2.5 mg to = 7.5 mg once a day  . docusate (COLACE) 50 MG/5ML liquid Give 10 ml by mouth one a day  . Olopatadine HCl 0.2 % SOLN Place 1 drop into both eyes daily as needed.  [name] omeprazole (PRILOSEC) 20 MG capsule Take 20 mg by mouth daily.  . ondansetron (ZOFRAN) 4 MG tablet Take 4 mg by mouth 2 (two) times daily.  . ondansetron (ZOFRAN) 4 MG tablet Take 4 mg by mouth every 8 (eight) hours as needed for nausea or vomiting.  . polyethylene glycol  (MIRALAX / GLYCOLAX) packet Take 17 g by mouth 2 (two) times daily.   [name] senna-docusate (SENOKOT-S) 8.6-50 MG tablet Take 2 tablets by mouth at bedtime.   . [DISCONTINUED] Multiple Vitamins-Minerals (MULTIVITAMINS/MINERALS ADULT) LIQD Take 5 mLs by mouth daily.   No facility-administered encounter medications on file as of 06/14/2018.      Review of Systems  Unable to perform ROS: Dementia    Immunization History  Administered Date(s) Administered  . Influenza-Unspecified 02/13/2014, 02/09/2016, 02/10/2017  . Pneumococcal Conjugate-13 02/10/2017  . Pneumococcal Polysaccharide-23 02/29/2016  . Tdap 01/30/2017   Pertinent  Health Maintenance Due  Topic Date Due  . INFLUENZA VACCINE  Completed  . DEXA SCAN  Completed  . PNA vac Low Risk Adult  Completed   Fall Risk  02/06/2018 01/26/2017  Falls in the past year? No No   Functional Status Survey:    Vitals:   06/14/18 1120  BP: 130/80  Pulse: (!) 52  Resp: 20  Temp: 98.2 F (36.8 C)  TempSrc: Oral  SpO2: 100%  Weight: 151 lb 9.6 oz (68.8 kg)  Height: 5\' 7"  (1.702 m)   Body mass index is 23.74 kg/m. Physical Exam Constitutional:      Appearance: She is well-developed.  HENT:     Head: Normocephalic.  Eyes:     Pupils: Pupils are equal, round, and reactive to light.  Neck:     Musculoskeletal: Neck supple.  Cardiovascular:     Rate and Rhythm: Normal rate and regular rhythm.  Pulmonary:     Effort: Pulmonary effort is normal. No respiratory distress.     Breath sounds: Normal breath sounds. No stridor.  Abdominal:     General: Bowel sounds are normal. There is no distension.     Palpations: Abdomen is soft.     Tenderness: There is no abdominal tenderness. There is no guarding.  Skin:    General: Skin is warm and dry.  Neurological:     Mental Status: She is alert.     Comments: Does not follow any Commands.And is non Verbal. She is wheelchair bound and is not Ambulatory.  Psychiatric:        Behavior:  Behavior normal.     Labs reviewed: Recent Labs    02/14/18 0702 02/21/18 0700 05/09/18 0807  NA 146* 145 143  K 3.4* 3.5 4.2  CL 112* 113* 107  CO2 25 24 29   GLUCOSE 93 102* 91  BUN 48* 44* 57*  CREATININE 1.71* 1.78* 2.12*  CALCIUM 9.7 9.6 9.5   Recent Labs    07/24/17 1911 02/07/18 0858  AST 23 20  ALT 13* 23  ALKPHOS 79 69  BILITOT 0.5 0.5  PROT 7.0 6.8  ALBUMIN 3.6 3.5   Recent Labs    08/10/17 0702 02/07/18 0858 05/09/18 0807  WBC 6.5 5.3 7.4  NEUTROABS 4.5 3.3 5.3  HGB 12.1 12.4 13.4  HCT 38.8  37.2 38.7 43.8  MCV 90.4 91.7 94.2  PLT 250 211 223   Lab Results  Component Value Date   TSH 0.823 04/10/2017   Lab Results  Component Value Date   HGBA1C  05/17/2007    5.4 (NOTE)   The ADA recommends the following therapeutic goals for glycemic   control related to Hgb A1C measurement:   Goal of Therapy:   < 7.0% Hgb A1C   Action Suggested:  > 8.0% Hgb A1C   Ref:  Diabetes Care, 22, Suppl. 1, 1999   Lab Results  Component Value Date   CHOL  05/22/2007    175        ATP III CLASSIFICATION:  <200     mg/dL   Desirable  200-239  mg/dL   Borderline High  >=240    mg/dL   High   HDL 37 (L) 05/22/2007   LDLCALC  05/22/2007    96        Total Cholesterol/HDL:CHD Risk Coronary Heart Disease Risk Table                     Men   Women  1/2 Average Risk   3.4   3.3   TRIG 208 (H) 05/22/2007   CHOLHDL 4.7 05/22/2007    Significant Diagnostic Results in last 30 days:  No results found.  Assessment/Plan Essential hypertension Blood pressure stable on Norvasc 7.5 mg  Dysphagia and gastroparesis Patient is on very low dose of Zofran BIDand Prilosec. She is off her reglan She only takes liquid and does not eat With help of her daughter and nurses they have been able to keep her weight stable Recent Lethargy .This can be related to worsening Dementia Alzheimer's dementia Continuesto be full Care.She is comfort care No More Aggressive  Treatment CKD Stage 4 Worsening of her renal function No more work-up     Family/ staff Communication:   Labs/tests ordered:    Total time spent in this patient care encounter was 25_ minutes; greater than 50% of the visit spent counseling patient, reviewing records , Labs and coordinating care for problems addressed at this encounter.

## 2018-06-18 ENCOUNTER — Other Ambulatory Visit (HOSPITAL_COMMUNITY)
Admission: AD | Admit: 2018-06-18 | Discharge: 2018-06-18 | Disposition: A | Discharge status: Home / Self Care | Payer: Medicare Other | Source: Skilled Nursing Facility | Attending: Internal Medicine | Admitting: Internal Medicine

## 2018-06-18 DIAGNOSIS — K219 Gastro-esophageal reflux disease without esophagitis: Secondary | ICD-10-CM | POA: Insufficient documentation

## 2018-06-18 DIAGNOSIS — F0281 Dementia in other diseases classified elsewhere with behavioral disturbance: Secondary | ICD-10-CM | POA: Diagnosis not present

## 2018-06-18 DIAGNOSIS — Z886 Allergy status to analgesic agent status: Secondary | ICD-10-CM | POA: Diagnosis not present

## 2018-06-18 DIAGNOSIS — I10 Essential (primary) hypertension: Secondary | ICD-10-CM | POA: Diagnosis not present

## 2018-06-18 DIAGNOSIS — K3184 Gastroparesis: Secondary | ICD-10-CM | POA: Diagnosis not present

## 2018-06-18 DIAGNOSIS — G300 Alzheimer's disease with early onset: Secondary | ICD-10-CM | POA: Insufficient documentation

## 2018-06-18 DIAGNOSIS — Z888 Allergy status to other drugs, medicaments and biological substances status: Secondary | ICD-10-CM | POA: Insufficient documentation

## 2018-06-18 DIAGNOSIS — R509 Fever, unspecified: Secondary | ICD-10-CM | POA: Diagnosis not present

## 2018-06-18 DIAGNOSIS — Z79899 Other long term (current) drug therapy: Secondary | ICD-10-CM | POA: Insufficient documentation

## 2018-06-18 LAB — INFLUENZA PANEL BY PCR (TYPE A & B)
Influenza A By PCR: NEGATIVE
Influenza B By PCR: NEGATIVE

## 2018-06-20 ENCOUNTER — Non-Acute Institutional Stay (SKILLED_NURSING_FACILITY): Payer: Medicare Other | Admitting: Internal Medicine

## 2018-06-20 ENCOUNTER — Encounter: Payer: Self-pay | Admitting: Internal Medicine

## 2018-06-20 DIAGNOSIS — R509 Fever, unspecified: Secondary | ICD-10-CM

## 2018-06-20 DIAGNOSIS — K3184 Gastroparesis: Secondary | ICD-10-CM | POA: Diagnosis not present

## 2018-06-20 DIAGNOSIS — G300 Alzheimer's disease with early onset: Secondary | ICD-10-CM

## 2018-06-20 DIAGNOSIS — F0281 Dementia in other diseases classified elsewhere with behavioral disturbance: Secondary | ICD-10-CM

## 2018-06-20 NOTE — Progress Notes (Signed)
Location:    [facility] Room Number: 110/W Place of Service:  SNF [phone]) Provider:  Freddi Starr, MD  Patient Care Team: [name], MD as PCP - General (Internal Medicine) [name], [name], MD as Consulting Physician (Gastroenterology)  Extended Emergency Contact Information Primary Emergency Contact: [name] Address: [address]          [place], [place] [address] [name] of [place] Phone: [phone] Work Phone: [phone] Mobile Phone: [phone] Relation: Daughter Secondary Emergency Contact: [name] Address: [address]          [place], [place] [address] [name] of [place] Phone: [phone] Mobile Phone: [phone] Relation: Relative  Code Status:  DNR Goals of care: Advanced Directive information Advanced Directives 06/20/2018  Does Patient Have a Medical Advance Directive? Yes  Type of Advance Directive Out of facility DNR (pink MOST or yellow form)  Does patient want to make changes to medical advance directive? No - Patient declined  Copy of [place] in Chart? No - copy requested  Would patient like information on creating a medical advance directive? No - Patient declined  Pre-existing out of facility DNR order (yellow form or pink MOST form) -     Chief Complaint  Patient presents with  . Acute Visit    Low Grade Temp last night    HPI:  Pt is a 83 y.o. female seen today for an acute visit for  Follow-up of apparently an elevated temperature overnight  Apparently routine vitals revealed a temp of 99.5 yesterday at some point.  Patient also apparently appeared to not be feeling well.  Patient is a long-term resident of facility and cannot give any review of systems secondary to severe dementia.  Her other diagnoses include chronic kidney disease as well as breast cancer status post mastectomy GERD and gastroparesis.  At one point she had significant  evidence of gastroparesis and had been on Reglan and Zofran- Reglan has been titrated off she is now on Zofran twice a day and then has PRN doses as needed she is also on a proton pump inhibitor and this appears to have stabilized things.  Apparently this morning patient appears to be more like herself according to nursing who is quite familiar with her she is not really talking much.  She is afebrile vital signs appear to be stable history of 98.1    Past Medical History:  Diagnosis Date  . Cancer ([facility])   . Dementia ([facility])   . Gallstones   . Gastroparalysis   . HTN (hypertension)   . Infiltrating ductal carcinoma of right female breast ([facility]) 12/02/2009   Qualifier: History of  By: [name] CMA [name]), June    . Osteoporosis 11/18/2011  . Reflux   . Renal disorder    renal insufficiency.    Past Surgical History:  Procedure Laterality Date  . BREAST BIOPSY    . BREAST LUMPECTOMY    . COLONOSCOPY  2009   Dr. Fuller Plan: normal  . ESOPHAGOGASTRODUODENOSCOPY  12/11/2009   Dr. Fuller Plan: hiatal hernia  . ESOPHAGOGASTRODUODENOSCOPY N/A 01/29/2014   Procedure: ESOPHAGOGASTRODUODENOSCOPY (EGD);  Surgeon: [name], MD;  [facility];  Service: Endoscopy;  Laterality: N/A;  245  . TONSILLECTOMY AND ADENOIDECTOMY      Allergies  Allergen Reactions  . Aricept [Donepezil Hydrochloride] Nausea And Vomiting  . Exelon [Rivastigmine Tartrate]   . Lithium Nausea And Vomiting    Outpatient Encounter  Medications as of 06/20/2018  Medication Sig  . acetaminophen (TYLENOL) 325 MG tablet Take two tablets by mouth twice a day, take 650 mg by mouth every 6 hours prn  . acetaminophen (TYLENOL) 325 MG tablet Take 650 mg by mouth 2 (two) times daily.  [name] amLODipine (NORVASC) 2.5 MG tablet Take 2.5 mg by mouth along with 5 mg to = 7.5 mg once a day  . amLODipine (NORVASC) 5 MG tablet Take 5 mg by mouth along with 2.5 mg to = 7.5 mg once a day  . docusate (COLACE) 50 MG/5ML liquid Give 10 ml by  mouth one a day  . Olopatadine HCl 0.2 % SOLN Place 1 drop into both eyes daily as needed.  [name] omeprazole (PRILOSEC) 20 MG capsule Take 20 mg by mouth daily.  . ondansetron (ZOFRAN) 4 MG tablet Take 4 mg by mouth 2 (two) times daily.  . ondansetron (ZOFRAN) 4 MG tablet Take 4 mg by mouth every 8 (eight) hours as needed for nausea or vomiting.  . polyethylene glycol (MIRALAX / GLYCOLAX) packet Take 17 g by mouth 2 (two) times daily.   [name] senna-docusate (SENOKOT-S) 8.6-50 MG tablet Take 2 tablets by mouth at bedtime.    No facility-administered encounter medications on file as of 06/20/2018.     Review of Systems   Is unobtainable secondary to dementia  Immunization History  Administered Date(s) Administered  . Influenza-Unspecified 02/13/2014, 02/09/2016, 02/10/2017  . Pneumococcal Conjugate-13 02/10/2017  . Pneumococcal Polysaccharide-23 02/29/2016  . Tdap 01/30/2017   Pertinent  Health Maintenance Due  Topic Date Due  . INFLUENZA VACCINE  Completed  . DEXA SCAN  Completed  . PNA vac Low Risk Adult  Completed   Fall Risk  02/06/2018 01/26/2017  Falls in the past year? No No   Functional Status Survey:    Vitals:   06/20/18 1103  BP: 137/84  Pulse: (!) 54  Resp: 20  Temp: 97.8 F (36.6 C)  TempSrc: Oral  SpO2: 98%  Weight: 151 lb 9.6 oz (68.8 kg)  Height: 5\' 7"  (1.702 m)   Body mass index is 23.74 kg/m. Physical Exam   In general this is a fairly well-nourished elderly female no distress sitting in her wheelchair at baseline.  Her skin is warm and dry.  Eyes tends to hold her eyes closely shot but from what I could ascertain sclera and conjunctive are clear visual acuity appears grossly intact.  Oropharynx mucous membranes appear to be moist.  Chest poor respiratory effort since she does not follow verbal commands but cannot appreciate any labored breathing or congestion.  GI abdomen is soft does not appear to be tender there are positive bowel  sounds.  Musculoskeletal appears able to move her extremities at baseline again she does not really follow verbal commands so this is somewhat difficult to fully ascertain.  Neurologic as noted above she is alert but not really talking today is not really even making eye contact which is not unusual.  Psych evidence of severe dementia  Labs reviewed: Recent Labs    02/14/18 0702 02/21/18 0700 05/09/18 0807  NA 146* 145 143  K 3.4* 3.5 4.2  CL 112* 113* 107  CO2 25 24 29   GLUCOSE 93 102* 91  BUN 48* 44* 57*  CREATININE 1.71* 1.78* 2.12*  CALCIUM 9.7 9.6 9.5   Recent Labs    07/24/17 1911 02/07/18 0858  AST 23 20  ALT 13* 23  ALKPHOS 79 69  BILITOT 0.5 0.5  PROT 7.0 6.8  ALBUMIN 3.6 3.5   Recent Labs    08/10/17 0702 02/07/18 0858 05/09/18 0807  WBC 6.5 5.3 7.4  NEUTROABS 4.5 3.3 5.3  HGB 12.1 12.4 13.4  HCT 38.8  37.2 38.7 43.8  MCV 90.4 91.7 94.2  PLT 250 211 223   Lab Results  Component Value Date   TSH 0.823 04/10/2017   Lab Results  Component Value Date   HGBA1C  05/17/2007    5.4 (NOTE)   The ADA recommends the following therapeutic goals for glycemic   control related to Hgb A1C measurement:   Goal of Therapy:   < 7.0% Hgb A1C   Action Suggested:  > 8.0% Hgb A1C   Ref:  Diabetes Care, 22, Suppl. 1, 1999   Lab Results  Component Value Date   CHOL  05/22/2007    175        ATP III CLASSIFICATION:  <200     mg/dL   Desirable  200-239  mg/dL   Borderline High  >=240    mg/dL   High   HDL 37 (L) 05/22/2007   LDLCALC  05/22/2007    96        Total Cholesterol/HDL:CHD Risk Coronary Heart Disease Risk Table                     Men   Women  1/2 Average Risk   3.4   3.3   TRIG 208 (H) 05/22/2007   CHOLHDL 4.7 05/22/2007    Significant Diagnostic Results in last 30 days:  No results found.  Assessment/Plan  #1 low-grade fever-at this point patient appears to be stable she is under largely comfort care measures- at this point will continue to  monitor vital signs pulse ox every shift - her daughter does not wish aggressive treatment prefers minimal-l if any --lab draws  Addendum-patient later today had a vomiting episode occasionally she will have this staff will give her a PRN dose of Zofran   physical exam is relatively unchanged.  She appears to be lying comfortably in bed she is alert has her eyes open now- and does not appear to be in any distress cannot really appreciate any lung congestion or abdominal discomfort    [phone]

## 2018-06-21 ENCOUNTER — Encounter: Payer: Self-pay | Admitting: Internal Medicine

## 2018-06-21 ENCOUNTER — Encounter (HOSPITAL_COMMUNITY)
Admission: RE | Admit: 2018-06-21 | Discharge: 2018-06-21 | Disposition: A | Discharge status: Home / Self Care | Payer: Medicare Other | Source: Skilled Nursing Facility | Attending: Internal Medicine | Admitting: Internal Medicine

## 2018-06-21 ENCOUNTER — Non-Acute Institutional Stay (SKILLED_NURSING_FACILITY): Payer: Medicare Other | Admitting: Internal Medicine

## 2018-06-21 DIAGNOSIS — F0390 Unspecified dementia without behavioral disturbance: Secondary | ICD-10-CM

## 2018-06-21 DIAGNOSIS — R509 Fever, unspecified: Secondary | ICD-10-CM | POA: Diagnosis not present

## 2018-06-21 DIAGNOSIS — N390 Urinary tract infection, site not specified: Secondary | ICD-10-CM | POA: Insufficient documentation

## 2018-06-21 DIAGNOSIS — I10 Essential (primary) hypertension: Secondary | ICD-10-CM | POA: Diagnosis not present

## 2018-06-21 DIAGNOSIS — F03C0 Unspecified dementia, severe, without behavioral disturbance, psychotic disturbance, mood disturbance, and anxiety: Secondary | ICD-10-CM

## 2018-06-21 LAB — URINALYSIS, MICROSCOPIC (REFLEX)
BACTERIA UA: NONE SEEN
Squamous Epithelial / LPF: NONE SEEN (ref 0–5)
WBC, UA: NONE SEEN WBC/hpf (ref 0–5)

## 2018-06-21 LAB — CBC WITH DIFFERENTIAL/PLATELET
Abs Immature Granulocytes: 0.02 10*3/uL (ref 0.00–0.07)
BASOS ABS: 0 10*3/uL (ref 0.0–0.1)
BASOS PCT: 0 %
Eosinophils Absolute: 0 10*3/uL (ref 0.0–0.5)
Eosinophils Relative: 0 %
HCT: 48.1 % — ABNORMAL HIGH (ref 36.0–46.0)
Hemoglobin: 14.6 g/dL (ref 12.0–15.0)
Immature Granulocytes: 0 %
Lymphocytes Relative: 17 %
Lymphs Abs: 1.1 10*3/uL (ref 0.7–4.0)
MCH: 28.7 pg (ref 26.0–34.0)
MCHC: 30.4 g/dL (ref 30.0–36.0)
MCV: 94.7 fL (ref 80.0–100.0)
Monocytes Absolute: 0.6 10*3/uL (ref 0.1–1.0)
Monocytes Relative: 10 %
NRBC: 0 % (ref 0.0–0.2)
Neutro Abs: 4.5 10*3/uL (ref 1.7–7.7)
Neutrophils Relative %: 73 %
PLATELETS: 215 10*3/uL (ref 150–400)
RBC: 5.08 MIL/uL (ref 3.87–5.11)
RDW: 13.5 % (ref 11.5–15.5)
WBC: 6.2 10*3/uL (ref 4.0–10.5)

## 2018-06-21 LAB — URINALYSIS, ROUTINE W REFLEX MICROSCOPIC
Bilirubin Urine: NEGATIVE
GLUCOSE, UA: NEGATIVE mg/dL
KETONES UR: NEGATIVE mg/dL
LEUKOCYTE UA: NEGATIVE
Nitrite: NEGATIVE
Specific Gravity, Urine: 1.015 (ref 1.005–1.030)
pH: 7.5 (ref 5.0–8.0)

## 2018-06-21 LAB — BASIC METABOLIC PANEL
ANION GAP: 13 (ref 5–15)
BUN: 38 mg/dL — ABNORMAL HIGH (ref 8–23)
CALCIUM: 10 mg/dL (ref 8.9–10.3)
CO2: 26 mmol/L (ref 22–32)
Chloride: 110 mmol/L (ref 98–111)
Creatinine, Ser: 2.1 mg/dL — ABNORMAL HIGH (ref 0.44–1.00)
GFR, EST AFRICAN AMERICAN: 25 mL/min — AB (ref >60)
GFR, EST NON AFRICAN AMERICAN: 22 mL/min — AB (ref >60)
GLUCOSE: 141 mg/dL — AB (ref 70–99)
Potassium: 3.8 mmol/L (ref 3.5–5.1)
Sodium: 149 mmol/L — ABNORMAL HIGH (ref 135–145)

## 2018-06-21 NOTE — Progress Notes (Signed)
Location:    [facility] Room Number: 110/W Place of Service:  SNF [phone]) Provider:  Freddi Starr, MD  Patient Care Team: [name], MD as PCP - General (Internal Medicine) [name], [name], MD as Consulting Physician (Gastroenterology)  Extended Emergency Contact Information Primary Emergency Contact: [name] Address: [address]          [place], [place] [address] [name] of [place] Phone: [phone] Work Phone: [phone] Mobile Phone: [phone] Relation: Daughter Secondary Emergency Contact: [name] Address: [address]          [place], [place] [address] [name] of [place] Phone: [phone] Mobile Phone: [phone] Relation: Relative  Code Status:  DNR Goals of care: Advanced Directive information Advanced Directives 06/21/2018  Does Patient Have a Medical Advance Directive? Yes  Type of Advance Directive Out of facility DNR (pink MOST or yellow form)  Does patient want to make changes to medical advance directive? No - Patient declined  Copy of Portal in Chart? No - copy requested  Would patient like information on creating a medical advance directive? No - Patient declined  Pre-existing out of facility DNR order (yellow form or pink MOST form) -    Chief complaint-acute visit follow-up of fever of unknown origin   HPI:  Pt is a 83 y.o. female seen today for an acute visit for  Recurrent fever.  Patient was seen yesterday for a low-grade fever- she appeared to be back at her baseline when I saw her she did have a vomiting episode later in the day and she was given PRN Zofran she is also on routine Zofran with a history of gastroparesis.  Apparently overnight patient again spiked a temperature of over 100.  This morning nursing staff did obtain lab work including a CBC and metabolic panel and a urinalysis and culture.  Lab work appears fairly unremarkable her  white count is normal at 6.2 hemoglobin stable at 14.6.  Metabolic panel does show a creatinine of 2.1 which is relatively her baseline recently- potassium is 3.8 sodium is mildly elevated at 149.  She also had a flu swab done on February 10 which was negative.  Currently she is lying in bed she does not appear to be uncomfortable but does not appear to be feeling well.  Blood pressure later today actually was up to 196/108-.  She did receive clonidine.  And blood pressure did come down to 160/100 about an hour later.  Her other diagnoses include chronic kidney disease as well as breast cancer status post mastectomy-GERD and gastroparesis.  Again she is on Zofran twice a day and PRN because of the gastroparesis.  She is also on a proton pump inhibitor.  There are suspicions with patient's progressing dementia that she has some chronic aspiration.      Past Medical History:  Diagnosis Date  . Cancer ([facility])   . Dementia ([facility])   . Gallstones   . Gastroparalysis   . HTN (hypertension)   . Infiltrating ductal carcinoma of right female breast ([facility]) 12/02/2009   Qualifier: History of  By: [name] CMA [name]), June    . Osteoporosis 11/18/2011  . Reflux   . Renal disorder    renal insufficiency.    Past Surgical History:  Procedure Laterality Date  . BREAST BIOPSY    . BREAST LUMPECTOMY    . COLONOSCOPY  2009   Dr. Fuller Plan: normal  . ESOPHAGOGASTRODUODENOSCOPY  12/11/2009  Dr. Fuller Plan: hiatal hernia  . ESOPHAGOGASTRODUODENOSCOPY N/A 01/29/2014   Procedure: ESOPHAGOGASTRODUODENOSCOPY (EGD);  Surgeon: [name], MD;  [facility];  Service: Endoscopy;  Laterality: N/A;  245  . TONSILLECTOMY AND ADENOIDECTOMY      Allergies  Allergen Reactions  . Aricept [Donepezil Hydrochloride] Nausea And Vomiting  . Exelon [Rivastigmine Tartrate]   . Lithium Nausea And Vomiting    Outpatient Encounter Medications as of 06/21/2018  Medication Sig  . acetaminophen (TYLENOL)  325 MG tablet Take two tablets by mouth twice a day, take 650 mg by mouth every 6 hours prn  . acetaminophen (TYLENOL) 325 MG tablet Take 650 mg by mouth 2 (two) times daily.  [name] amLODipine (NORVASC) 2.5 MG tablet Take 2.5 mg by mouth along with 5 mg to = 7.5 mg once a day  . amLODipine (NORVASC) 5 MG tablet Take 5 mg by mouth along with 2.5 mg to = 7.5 mg once a day  . docusate (COLACE) 50 MG/5ML liquid Give 10 ml by mouth one a day  . Olopatadine HCl 0.2 % SOLN Place 1 drop into both eyes daily as needed.  [name] omeprazole (PRILOSEC) 20 MG capsule Take 20 mg by mouth daily.  . ondansetron (ZOFRAN) 4 MG tablet Take 4 mg by mouth 2 (two) times daily.  . ondansetron (ZOFRAN) 4 MG tablet Take 4 mg by mouth every 8 (eight) hours as needed for nausea or vomiting.  . polyethylene glycol (MIRALAX / GLYCOLAX) packet Take 17 g by mouth 2 (two) times daily.   [name] senna-docusate (SENOKOT-S) 8.6-50 MG tablet Take 2 tablets by mouth at bedtime.    No facility-administered encounter medications on file as of 06/21/2018.     Review of Systems   Unobtainable secondary to dementia please see HPI  Immunization History  Administered Date(s) Administered  . Influenza-Unspecified 02/13/2014, 02/09/2016, 02/10/2017  . Pneumococcal Conjugate-13 02/10/2017  . Pneumococcal Polysaccharide-23 02/29/2016  . Tdap 01/30/2017   Pertinent  Health Maintenance Due  Topic Date Due  . INFLUENZA VACCINE  Completed  . DEXA SCAN  Completed  . PNA vac Low Risk Adult  Completed   Fall Risk  02/06/2018 01/26/2017  Falls in the past year? No No   Functional Status Survey:    Vitals:   06/21/18 1221  BP: (!) 160/100  Pulse: 80  Resp: 18  Temp: 99.1 F (37.3 C)  TempSrc: Oral  SpO2: 97%  Weight: 151 lb 9.6 oz (68.8 kg)  Height: 5\' 7"  (1.702 m)   Body mass index is 23.74 kg/m. Physical Exam  In general this is a fairly well-nourished elderly female she is not in any distress she is lying in bed she does not appear  uncomfortable but does not appear to be feeling white like her self.  Her skin is warm and dry possibly bit more warm than her baseline.  She does have somewhat of a flushed appearance in her face.  Eyes visual acuity appears to be intact sclera and conjunctive are clear.  Oropharynx from what I could assess mucous membranes appeared fairly moist but she did not open her mouth very wide.  Chest she has poor respiratory effort could not really appreciate overt congestion or labored breathing but respiratory effort was quite poor.  Abdomen is soft does not appear to be tender there are positive bowel sounds.  Musculoskeletal appears able to move all extremities x4 this is limited since she is in bed.  Neurologic appears grossly intact she is  not really talking today cranial nerves appear to be intact.  Psych findings consistent with significant dementia  Labs reviewed: Recent Labs    02/21/18 0700 05/09/18 0807 06/21/18 1009  NA 145 143 149*  K 3.5 4.2 3.8  CL 113* 107 110  CO2 24 29 26   GLUCOSE 102* 91 141*  BUN 44* 57* 38*  CREATININE 1.78* 2.12* 2.10*  CALCIUM 9.6 9.5 10.0   Recent Labs    07/24/17 1911 02/07/18 0858  AST 23 20  ALT 13* 23  ALKPHOS 79 69  BILITOT 0.5 0.5  PROT 7.0 6.8  ALBUMIN 3.6 3.5   Recent Labs    02/07/18 0858 05/09/18 0807 06/21/18 1009  WBC 5.3 7.4 6.2  NEUTROABS 3.3 5.3 4.5  HGB 12.4 13.4 14.6  HCT 38.7 43.8 48.1*  MCV 91.7 94.2 94.7  PLT 211 223 215   Lab Results  Component Value Date   TSH 0.823 04/10/2017   Lab Results  Component Value Date   HGBA1C  05/17/2007    5.4 (NOTE)   The ADA recommends the following therapeutic goals for glycemic   control related to Hgb A1C measurement:   Goal of Therapy:   < 7.0% Hgb A1C   Action Suggested:  > 8.0% Hgb A1C   Ref:  Diabetes Care, 22, Suppl. 1, 1999   Lab Results  Component Value Date   CHOL  05/22/2007    175        ATP III CLASSIFICATION:  <200     mg/dL   Desirable   200-239  mg/dL   Borderline High  >=240    mg/dL   High   HDL 37 (L) 05/22/2007   LDLCALC  05/22/2007    96        Total Cholesterol/HDL:CHD Risk Coronary Heart Disease Risk Table                     Men   Women  1/2 Average Risk   3.4   3.3   TRIG 208 (H) 05/22/2007   CHOLHDL 4.7 05/22/2007    Significant Diagnostic Results in last 30 days:  No results found.  Assessment/Plan  #1 fever of unknown origin.  This appears to be recurrent at times.  Again lab work appeared to be unremarkable urine culture is pending.  There are suspicions of recurrent aspiration.  I did have a discussion with patient's daughter [name]- about goals of care- we did discuss the recurrence of this fever and possible aspiration and that this is likely to keep reoccurring.  We discussed options including starting antibiotics obtaining a chest x-ray and being aggressive. Versus largely comfort measures with minimal labs if any- as well as very limited use of antibiotics Per discussion her daughter desires at this point comfort measures.  Suspect if there is continued decline or signs of discomfort will start Roxanol.  Again daughter states that her primary goal is that her mother not be in pain or uncomfortable--she expressed realization that at some point patient will have a significant decline possibly due to an infection or aspiration but that emphasis should be on comfort and not aggressive treatment.   Addendum- this afternoon I did reevaluate patient she actually appear to be feeling better she was opening her eyes more talking some little confused which is her baseline.  I got a blood pressure of 160/92.  Pulse was 84.  At this point again will put emphasis on comfort measures will not order  a chest x-ray or any antibiotics she appears to be doing a bit better although this could be transitory.  Continue to monitor vital signs and pulse ox.  [id number]

## 2018-06-22 ENCOUNTER — Encounter: Payer: Self-pay | Admitting: Internal Medicine

## 2018-06-22 LAB — URINE CULTURE: CULTURE: NO GROWTH

## 2018-07-20 ENCOUNTER — Non-Acute Institutional Stay (SKILLED_NURSING_FACILITY): Payer: Medicare Other | Admitting: Adult Health

## 2018-07-20 ENCOUNTER — Encounter: Payer: Self-pay | Admitting: Adult Health

## 2018-07-20 DIAGNOSIS — N184 Chronic kidney disease, stage 4 (severe): Secondary | ICD-10-CM

## 2018-07-20 DIAGNOSIS — G300 Alzheimer's disease with early onset: Secondary | ICD-10-CM

## 2018-07-20 DIAGNOSIS — K5909 Other constipation: Secondary | ICD-10-CM | POA: Diagnosis not present

## 2018-07-20 DIAGNOSIS — K3184 Gastroparesis: Secondary | ICD-10-CM

## 2018-07-20 DIAGNOSIS — I129 Hypertensive chronic kidney disease with stage 1 through stage 4 chronic kidney disease, or unspecified chronic kidney disease: Secondary | ICD-10-CM

## 2018-07-20 DIAGNOSIS — R52 Pain, unspecified: Secondary | ICD-10-CM

## 2018-07-20 DIAGNOSIS — F3130 Bipolar disorder, current episode depressed, mild or moderate severity, unspecified: Secondary | ICD-10-CM

## 2018-07-20 DIAGNOSIS — G8929 Other chronic pain: Secondary | ICD-10-CM | POA: Diagnosis not present

## 2018-07-20 DIAGNOSIS — K219 Gastro-esophageal reflux disease without esophagitis: Secondary | ICD-10-CM

## 2018-07-20 DIAGNOSIS — R1314 Dysphagia, pharyngoesophageal phase: Secondary | ICD-10-CM

## 2018-07-20 DIAGNOSIS — F02818 Dementia in other diseases classified elsewhere, unspecified severity, with other behavioral disturbance: Secondary | ICD-10-CM

## 2018-07-20 DIAGNOSIS — I10 Essential (primary) hypertension: Secondary | ICD-10-CM

## 2018-07-20 DIAGNOSIS — E43 Unspecified severe protein-calorie malnutrition: Secondary | ICD-10-CM

## 2018-07-20 DIAGNOSIS — F0281 Dementia in other diseases classified elsewhere with behavioral disturbance: Secondary | ICD-10-CM | POA: Diagnosis not present

## 2018-07-20 NOTE — Progress Notes (Signed)
Location:    [facility] Room Number: 110/W Place of Service:  SNF (31)   CODE STATUS: DNR (comfort care: no hospitalization; no lab work; no tube feeding)   Allergies  Allergen Reactions  . Aricept [Donepezil Hydrochloride] Nausea And Vomiting  . Exelon [Rivastigmine Tartrate]   . Lithium Nausea And Vomiting    Chief Complaint  Patient presents with  . Medical Management of Chronic Issues    Essential hypertension; gastroesophageal reflux disease without esophagitis; gastroparesis.     HPI:  She is a 83 year old long term resident of this facility being seen for the management of her chronic illnesses: hypertension; gerd gastroparesis. There are no reports of nausea or vomiting; no repots of changes in appetite; no reports of uncontrolled pain . She is not able to hold her head upright will have OT see her for splinting.   Past Medical History:  Diagnosis Date  . Cancer ([place])   . Dementia ([place])   . Gallstones   . Gastroparalysis   . HTN (hypertension)   . Infiltrating ductal carcinoma of right female breast ([place]) 12/02/2009   Qualifier: History of  By: [name] CMA [name]), June    . Osteoporosis 11/18/2011  . Reflux   . Renal disorder    renal insufficiency.     Past Surgical History:  Procedure Laterality Date  . BREAST BIOPSY    . BREAST LUMPECTOMY    . COLONOSCOPY  2009   Dr. Fuller Plan: normal  . ESOPHAGOGASTRODUODENOSCOPY  12/11/2009   Dr. Fuller Plan: hiatal hernia  . ESOPHAGOGASTRODUODENOSCOPY N/A 01/29/2014   Procedure: ESOPHAGOGASTRODUODENOSCOPY (EGD);  Surgeon: [name], MD;  [facility];  Service: Endoscopy;  Laterality: N/A;  245  . TONSILLECTOMY AND ADENOIDECTOMY      Social History   Socioeconomic History  . Marital status: Divorced    Spouse name: Not on file  . Number of children: 2  . Years of education: college  . Highest education level: Not on file  Occupational History  . Occupation: retired    Fish farm manager:  RETIRED  Social Needs  . Financial resource strain: Not on file  . Food insecurity:    Worry: Not on file    Inability: Not on file  . Transportation needs:    Medical: Not on file    Non-medical: Not on file  Tobacco Use  . Smoking status: Never Smoker  . Smokeless tobacco: Never Used  Substance and Sexual Activity  . Alcohol use: No  . Drug use: No  . Sexual activity: Not on file  Lifestyle  . Physical activity:    Days per week: Not on file    Minutes per session: Not on file  . Stress: Not on file  Relationships  . Social connections:    Talks on phone: Not on file    Gets together: Not on file    Attends religious service: Not on file    Active member of club or organization: Not on file    Attends meetings of clubs or organizations: Not on file    Relationship status: Not on file  . Intimate partner violence:    Fear of current or ex partner: Not on file    Emotionally abused: Not on file    Physically abused: Not on file    Forced sexual activity: Not on file  Other Topics Concern  . Not on file  Social History Narrative  . Not on file   Family  History  Problem Relation Age of Onset  . Cancer Sister        breast  . Cancer Other   . Colon cancer Neg Hx       VITAL SIGNS BP 129/80   Pulse (!) 16   Temp (!) 97.5 F (36.4 C) (Oral)   Resp 18   Ht 5\' 7"  (1.702 m)   Wt 149 lb 9.6 oz (67.9 kg)   SpO2 100%   BMI 23.43 kg/m   Outpatient Encounter Medications as of 07/20/2018  Medication Sig  . acetaminophen (TYLENOL) 325 MG tablet Take two tablets by mouth twice a day, take 650 mg by mouth every 6 hours prn  . acetaminophen (TYLENOL) 325 MG tablet Take 650 mg by mouth 2 (two) times daily.  [name] amLODipine (NORVASC) 2.5 MG tablet Take 2.5 mg by mouth along with 5 mg to = 7.5 mg once a day  . amLODipine (NORVASC) 5 MG tablet Take 5 mg by mouth along with 2.5 mg to = 7.5 mg once a day  . Balsam [place]-Castor Oil (VENELEX) OINT Apply to sacrum and bilateral  buttocks qshift & prn for prevention  . docusate (COLACE) 50 MG/5ML liquid Give 10 ml by mouth one a day  . Olopatadine HCl 0.2 % SOLN Place 1 drop into both eyes daily as needed.  [name] omeprazole (PRILOSEC) 20 MG capsule Take 20 mg by mouth daily.  . ondansetron (ZOFRAN) 4 MG tablet Take 4 mg by mouth 2 (two) times daily.  . ondansetron (ZOFRAN) 4 MG tablet Take 4 mg by mouth every 8 (eight) hours as needed for nausea or vomiting.  . polyethylene glycol (MIRALAX / GLYCOLAX) packet Take 17 g by mouth 2 (two) times daily.   [name] senna-docusate (SENOKOT-S) 8.6-50 MG tablet Take 2 tablets by mouth at bedtime.    No facility-administered encounter medications on file as of 07/20/2018.      SIGNIFICANT DIAGNOSTIC EXAMS  LABS REVIEWED TODAY:   05-09-18: wbc 7.4; hgb 13.4; hct 43.8; mcv 94.2 plt 223; glucose 91; bun 57; creat 2.12; k+ 4.2; na++ 143; ca 9.5 06-21-18: wbc 6.2; hgb 14.6; hct 48.1; mcv 94. 7 plt 215; glucose 141; bun 38; creat 2.10; k+ 3.8 na++  149; ca 10.0     Review of Systems  Unable to perform ROS: Dementia (unable to participate )    Physical Exam Constitutional:      General: She is not in acute distress.    Appearance: She is well-developed. She is not diaphoretic.  Neck:     Musculoskeletal: Neck supple.     Thyroid: No thyromegaly.  Cardiovascular:     Rate and Rhythm: Normal rate and regular rhythm.     Pulses: Normal pulses.     Heart sounds: Normal heart sounds.  Pulmonary:     Effort: Pulmonary effort is normal. No respiratory distress.     Breath sounds: Normal breath sounds.  Abdominal:     General: Bowel sounds are normal. There is no distension.     Palpations: Abdomen is soft.     Tenderness: There is no abdominal tenderness.  Musculoskeletal:     Right lower leg: No edema.     Left lower leg: No edema.     Comments: Is able to move all extremities Does not hold up head.   Lymphadenopathy:     Cervical: No cervical adenopathy.  Skin:    General: Skin  is warm and dry.  Neurological:  Mental Status: She is alert. Mental status is at baseline.  Psychiatric:        Mood and Affect: Mood normal.       ASSESSMENT/ PLAN:  TODAY:   1. Essential hypertension: is stable b/p 129/80: will continue norvasc 7.5 mg daily  2. GERD without esophagitis/ gastroparesis: is stable will continue prilosec 20 mg daily and zofran 4 mg twice daily and every 8 hours as needed  3. Dysphagia pharyngoesophageal phase: no signs of aspiration present; will monitor   4. Early onset Alzheimer's disease with behavioral disturbance: is without change: weight is 149 pounds; will monitor her status.   5. Protein calories malnutrition severe: is without change will continue supplements as directed will monitor   6. Infiltrating ductal carcinoma of right female breast: stage unknown (T3, NX, cM0) is status post lumpectomy  7. Chronic generalized pain: no indications of pain present; will tylenol 65 mg twice daily   8. Chronic constipation: is stable will continue colace daily miralax twice daily and senna s 2 tabs daily   9.CKD stage 4 secondary to hypertension: is stable bun 38; creat 2.10 will monitor   10. FTT (failure to thrive): in adult: is without change: weight is 149 pounds will continue supplements as indicated  11. Bipolar affective disorder current episode depressed, current episode severity unspecified: is presently stable will monitor      MD is aware of resident's narcotic use and is in agreement with current plan of care. We will attempt to wean resident as apropriate   Ok Edwards NP [facility]  Contact [phone] Monday through Friday 8am- 5pm  After hours call [phone]

## 2018-07-28 DIAGNOSIS — R52 Pain, unspecified: Secondary | ICD-10-CM

## 2018-07-28 DIAGNOSIS — G8929 Other chronic pain: Secondary | ICD-10-CM | POA: Insufficient documentation

## 2018-07-30 ENCOUNTER — Non-Acute Institutional Stay (SKILLED_NURSING_FACILITY): Payer: Medicare Other | Admitting: Internal Medicine

## 2018-07-30 ENCOUNTER — Encounter: Payer: Self-pay | Admitting: Internal Medicine

## 2018-07-30 DIAGNOSIS — R509 Fever, unspecified: Secondary | ICD-10-CM | POA: Diagnosis not present

## 2018-07-30 DIAGNOSIS — I10 Essential (primary) hypertension: Secondary | ICD-10-CM | POA: Diagnosis not present

## 2018-07-30 DIAGNOSIS — K3184 Gastroparesis: Secondary | ICD-10-CM | POA: Diagnosis not present

## 2018-07-30 DIAGNOSIS — K219 Gastro-esophageal reflux disease without esophagitis: Secondary | ICD-10-CM

## 2018-07-30 NOTE — Progress Notes (Signed)
Location:  [facility] Room Number: [place] of Service:  SNF [phone]) Provider:  Denice Bors, MD  [name], MD  Patient Care Team: [name], MD as PCP - General (Internal Medicine) [name] [name], MD as Consulting Physician (Gastroenterology)  Extended Emergency Contact Information Primary Emergency Contact: [facility] Address: [address]          [place], [place] [address] [name] of [place] Phone: [phone] Work Phone: [phone] Mobile Phone: [phone] Relation: Daughter Secondary Emergency Contact: [name] Address: [address]          [place], [place] [address] [name] of [place] Phone: [phone] Mobile Phone: [phone] Relation: Relative  Code Status:  DNR Goals of care: Advanced Directive information Advanced Directives 07/30/2018  Does Patient Have a Medical Advance Directive? Yes  Type of Advance Directive Out of facility DNR (pink MOST or yellow form)  Does patient want to make changes to medical advance directive? No - Patient declined  Copy of [place] in Chart? -  Would patient like information on creating a medical advance directive? No - Patient declined  Pre-existing out of facility DNR order (yellow form or pink MOST form) Yellow form placed in chart (order not valid for inpatient use);Pink MOST form placed in chart (order not valid for inpatient use)     Chief Complaint  Patient presents with  . Acute Visit    Vomiting    HPI:  Pt is a 83 y.o. female seen today for an acute visit for Vomiting over the wekend  Patient has h/o hypertension, Advanced Dementia, CKD stge 4 , Breast cancer s/p Mastectomy, GERD and Gastroparesis. Patient is Long term Resident of facility. Patient has advanced dementia and is unable to  give me any history. She also Has h/o Gastroparesis. And is on Zofran. She was also on Reglan but it was weaned off due to her daughter was  worried about the side effects. Patient continues to have progression in her dementia. And recently has started having Vomiting again. She is on Fluids only due to her dysphagia. Patient also spikes low grade temp of 99-100 over past few days especially after vomiting. Previously it has been thought to be due to silent aspiration. And since she is comfort care her daughter did not want to do any antibiotics or chest xray..  Patient is in Bed looks More sleepy and less responsive. Does not seem to be in any acute distress.No SOB or cough.  Patient weight has been is down 2 lbs and 6 lbs since 10/19 .   Past Medical History:  Diagnosis Date  . Cancer ([place])   . Dementia ([place])   . Gallstones   . Gastroparalysis   . HTN (hypertension)   . Infiltrating ductal carcinoma of right female breast ([place]) 12/02/2009   Qualifier: History of  By: [name] CMA [name]), June    . Osteoporosis 11/18/2011  . Reflux   . Renal disorder    renal insufficiency.    Past Surgical History:  Procedure Laterality Date  . BREAST BIOPSY    . BREAST LUMPECTOMY    . COLONOSCOPY  2009   Dr. Fuller Plan: normal  . ESOPHAGOGASTRODUODENOSCOPY  12/11/2009   Dr. Fuller Plan: hiatal hernia  . ESOPHAGOGASTRODUODENOSCOPY N/A 01/29/2014   Procedure: ESOPHAGOGASTRODUODENOSCOPY (EGD);  Surgeon: [name], MD;  [facility];  Service: Endoscopy;  Laterality: N/A;  245  . TONSILLECTOMY AND ADENOIDECTOMY  Allergies  Allergen Reactions  . Aricept [Donepezil Hydrochloride] Nausea And Vomiting  . Exelon [Rivastigmine Tartrate]   . Lithium Nausea And Vomiting    Outpatient Encounter Medications as of 07/30/2018  Medication Sig  . acetaminophen (TYLENOL) 325 MG tablet Take two tablets by mouth twice a day, take 650 mg by mouth every 6 hours prn  . amLODipine (NORVASC) 2.5 MG tablet Take 2.5 mg by mouth along with 5 mg to = 7.5 mg once a day  . amLODipine (NORVASC) 5 MG tablet Take 5 mg by mouth along with 2.5 mg to =  7.5 mg once a day  . Balsam [place]-Castor Oil (VENELEX) OINT Apply to sacrum and bilateral buttocks qshift & prn for prevention  . docusate (COLACE) 50 MG/5ML liquid Give 10 ml by mouth one a day  . NON FORMULARY Diet Type:  Full Liquids only  . Olopatadine HCl 0.2 % SOLN Place 1 drop into both eyes daily as needed.  [name] omeprazole (PRILOSEC) 20 MG capsule Take 20 mg by mouth daily.  . ondansetron (ZOFRAN) 4 MG tablet Give 1 tablet by mouth two times daily and every 8 hours as needed  . polyethylene glycol (MIRALAX / GLYCOLAX) packet Take 17 g by mouth 2 (two) times daily.   [name] senna-docusate (SENOKOT-S) 8.6-50 MG tablet Take 2 tablets by mouth at bedtime.   . [DISCONTINUED] acetaminophen (TYLENOL) 325 MG tablet Take 650 mg by mouth 2 (two) times daily.  . [DISCONTINUED] ondansetron (ZOFRAN) 4 MG tablet Take 4 mg by mouth 2 (two) times daily.   No facility-administered encounter medications on file as of 07/30/2018.     Review of Systems  Unable to perform ROS: Dementia    Immunization History  Administered Date(s) Administered  . Influenza-Unspecified 02/13/2014, 02/09/2016, 02/10/2017, 02/08/2018  . Pneumococcal Conjugate-13 02/10/2017  . Pneumococcal Polysaccharide-23 02/29/2016  . Tdap 01/30/2017   Pertinent  Health Maintenance Due  Topic Date Due  . INFLUENZA VACCINE  Completed  . DEXA SCAN  Completed  . PNA vac Low Risk Adult  Completed   Fall Risk  02/06/2018 01/26/2017  Falls in the past year? No No   Functional Status Survey:    Vitals:   07/30/18 1352  BP: (!) 146/96  Pulse: 90  Resp: 20  Temp: 100 F (37.8 C)  Weight: 149 lb 9.6 oz (67.9 kg)  Height: 5\' 7"  (1.702 m)   Body mass index is 23.43 kg/m. Physical Exam Constitutional:      Appearance: She is well-developed.  HENT:     Head: Normocephalic.  Eyes:     Pupils: Pupils are equal, round, and reactive to light.  Neck:     Musculoskeletal: Neck supple.  Cardiovascular:     Rate and Rhythm: Normal rate  and regular rhythm.  Pulmonary:     Effort: Pulmonary effort is normal. No respiratory distress.     Breath sounds: Normal breath sounds. No stridor.  Abdominal:     General: Bowel sounds are normal. There is no distension.     Palpations: Abdomen is soft.     Tenderness: There is no abdominal tenderness. There is no guarding.  Skin:    General: Skin is warm and dry.  Neurological:     Mental Status: She is alert.     Comments: Does not follow any Commands.And is non Verbal. She is wheelchair bound and is not Ambulatory.  Psychiatric:        Behavior: Behavior normal.     Labs  reviewed: Recent Labs    02/21/18 0700 05/09/18 0807 06/21/18 1009  NA 145 143 149*  K 3.5 4.2 3.8  CL 113* 107 110  CO2 24 29 26   GLUCOSE 102* 91 141*  BUN 44* 57* 38*  CREATININE 1.78* 2.12* 2.10*  CALCIUM 9.6 9.5 10.0   Recent Labs    02/07/18 0858  AST 20  ALT 23  ALKPHOS 69  BILITOT 0.5  PROT 6.8  ALBUMIN 3.5   Recent Labs    02/07/18 0858 05/09/18 0807 06/21/18 1009  WBC 5.3 7.4 6.2  NEUTROABS 3.3 5.3 4.5  HGB 12.4 13.4 14.6  HCT 38.7 43.8 48.1*  MCV 91.7 94.2 94.7  PLT 211 223 215   Lab Results  Component Value Date   TSH 0.823 04/10/2017   Lab Results  Component Value Date   HGBA1C  05/17/2007    5.4 (NOTE)   The ADA recommends the following therapeutic goals for glycemic   control related to Hgb A1C measurement:   Goal of Therapy:   < 7.0% Hgb A1C   Action Suggested:  > 8.0% Hgb A1C   Ref:  Diabetes Care, 22, Suppl. 1, 1999   Lab Results  Component Value Date   CHOL  05/22/2007    175        ATP III CLASSIFICATION:  <200     mg/dL   Desirable  200-239  mg/dL   Borderline High  >=240    mg/dL   High   HDL 37 (L) 05/22/2007   LDLCALC  05/22/2007    96        Total Cholesterol/HDL:CHD Risk Coronary Heart Disease Risk Table                     Men   Women  1/2 Average Risk   3.4   3.3   TRIG 208 (H) 05/22/2007   CHOLHDL 4.7 05/22/2007    Significant  Diagnostic Results in last 30 days:  No results found.  Assessment/Plan Dysphagia and gastroparesis Continue on Zofran D/W Daughter and restarted on her Reglan 5 mg BID Will not do Chest Xray or UA for her low grade temp. No Antibiotics for now unless her temp runs higher tylenol PRN for low grade temp Also on prilosec  Recent Lethargy .This can be related to worsening Dementiaor dehydration At this time no further work up Alzheimer's dementia Continuesto be full Care.She is comfort care No More Aggressive Treatment CKD Stage 4 Worsening of her renal function No more work-up    Family/ staff Communication: D/W her Daughter  Labs/tests ordered:  Total time spent in this patient care encounter was _ minutes; greater than 50% of the visit spent Daughter , reviewing records , Labs and coordinating care for problems addressed at this encounter.

## 2018-07-31 DIAGNOSIS — K219 Gastro-esophageal reflux disease without esophagitis: Secondary | ICD-10-CM | POA: Diagnosis not present

## 2018-07-31 DIAGNOSIS — I10 Essential (primary) hypertension: Secondary | ICD-10-CM | POA: Diagnosis not present

## 2018-07-31 DIAGNOSIS — R1311 Dysphagia, oral phase: Secondary | ICD-10-CM | POA: Diagnosis not present

## 2018-07-31 DIAGNOSIS — F0391 Unspecified dementia with behavioral disturbance: Secondary | ICD-10-CM | POA: Diagnosis not present

## 2018-08-02 ENCOUNTER — Encounter: Payer: Self-pay | Admitting: Internal Medicine

## 2018-08-02 ENCOUNTER — Non-Acute Institutional Stay (SKILLED_NURSING_FACILITY): Payer: Medicare Other | Admitting: Internal Medicine

## 2018-08-02 DIAGNOSIS — R1314 Dysphagia, pharyngoesophageal phase: Secondary | ICD-10-CM

## 2018-08-02 DIAGNOSIS — I129 Hypertensive chronic kidney disease with stage 1 through stage 4 chronic kidney disease, or unspecified chronic kidney disease: Secondary | ICD-10-CM

## 2018-08-02 DIAGNOSIS — N184 Chronic kidney disease, stage 4 (severe): Secondary | ICD-10-CM | POA: Diagnosis not present

## 2018-08-02 DIAGNOSIS — G300 Alzheimer's disease with early onset: Secondary | ICD-10-CM | POA: Diagnosis not present

## 2018-08-02 DIAGNOSIS — I10 Essential (primary) hypertension: Secondary | ICD-10-CM | POA: Diagnosis not present

## 2018-08-02 DIAGNOSIS — K3184 Gastroparesis: Secondary | ICD-10-CM

## 2018-08-02 DIAGNOSIS — F0281 Dementia in other diseases classified elsewhere with behavioral disturbance: Secondary | ICD-10-CM | POA: Diagnosis not present

## 2018-08-02 DIAGNOSIS — F02818 Dementia in other diseases classified elsewhere, unspecified severity, with other behavioral disturbance: Secondary | ICD-10-CM

## 2018-08-02 DIAGNOSIS — K219 Gastro-esophageal reflux disease without esophagitis: Secondary | ICD-10-CM | POA: Diagnosis not present

## 2018-08-02 DIAGNOSIS — F0391 Unspecified dementia with behavioral disturbance: Secondary | ICD-10-CM | POA: Diagnosis not present

## 2018-08-02 DIAGNOSIS — R1311 Dysphagia, oral phase: Secondary | ICD-10-CM | POA: Diagnosis not present

## 2018-08-02 NOTE — Progress Notes (Signed)
Location:  [facility] Room Number: [place] of Service:  SNF [phone]) Provider:  Veleta Miners, MD  [name], MD  Patient Care Team: [name], MD as PCP - General (Internal Medicine) [name] [name], MD as Consulting Physician (Gastroenterology)  Extended Emergency Contact Information Primary Emergency Contact: [facility] Address: [address]          [place], [place] [address] [name] of [place] Phone: [phone] Work Phone: [phone] Mobile Phone: [phone] Relation: Daughter Secondary Emergency Contact: [name] Address: [address]          [place], [place] [address] [name] of [place] Phone: [phone] Mobile Phone: [phone] Relation: Relative  Code Status:  DNR Goals of care: Advanced Directive information Advanced Directives 07/30/2018  Does Patient Have a Medical Advance Directive? Yes  Type of Advance Directive Out of facility DNR (pink MOST or yellow form)  Does patient want to make changes to medical advance directive? No - Patient declined  Copy of [place] in Chart? -  Would patient like information on creating a medical advance directive? No - Patient declined  Pre-existing out of facility DNR order (yellow form or pink MOST form) Yellow form placed in chart (order not valid for inpatient use);Pink MOST form placed in chart (order not valid for inpatient use)     Chief Complaint  Patient presents with  . Acute Visit    End of Life Care    HPI:  Pt is a 83 y.o. female seen today for an acute visit for Hospice request by her Daughter Patient has h/o hypertension, Advanced Dementia, CKD stge 4, Breast cancer s/p Mastectomy, GERD and Gastroparesis. Patient is Long term Resident of facility. Patient has advanced Dementia. She is unable to give any history. Patient has history of gastroparesis.  She is on Zofran now and Reglan which was started few days ago. Patient's  daughter is unable to come and visit her mom and she was concerned and was wondering do we have to get hospice involved.  She also is worried that patient's is not doing that well overall. Patient has not spiked any fever for past 48 hours.  I discussed with the nurses she has not had any more vomiting except when she tries to drink thick liquids.  She was up in a wheelchair and was seen comfortable.  Continues to be less responsive which is just the progression of her dementia.    Past Medical History:  Diagnosis Date  . Cancer ([place])   . Dementia ([place])   . Gallstones   . Gastroparalysis   . HTN (hypertension)   . Infiltrating ductal carcinoma of right female breast ([place]) 12/02/2009   Qualifier: History of  By: [name] CMA [name]), June    . Osteoporosis 11/18/2011  . Reflux   . Renal disorder    renal insufficiency.    Past Surgical History:  Procedure Laterality Date  . BREAST BIOPSY    . BREAST LUMPECTOMY    . COLONOSCOPY  2009   Dr. Fuller Plan: normal  . ESOPHAGOGASTRODUODENOSCOPY  12/11/2009   Dr. Fuller Plan: hiatal hernia  . ESOPHAGOGASTRODUODENOSCOPY N/A 01/29/2014   Procedure: ESOPHAGOGASTRODUODENOSCOPY (EGD);  Surgeon: [name], MD;  [facility];  Service: Endoscopy;  Laterality: N/A;  245  . TONSILLECTOMY AND ADENOIDECTOMY      Allergies  Allergen Reactions  . Aricept [Donepezil Hydrochloride] Nausea And Vomiting  . Exelon [Rivastigmine Tartrate]   . Lithium  Nausea And Vomiting    Outpatient Encounter Medications as of 08/02/2018  Medication Sig  . acetaminophen (TYLENOL) 325 MG tablet Take two tablets by mouth twice a day, take 650 mg by mouth every 6 hours prn  . amLODipine (NORVASC) 2.5 MG tablet Take 2.5 mg by mouth along with 5 mg to = 7.5 mg once a day  . amLODipine (NORVASC) 5 MG tablet Take 5 mg by mouth along with 2.5 mg to = 7.5 mg once a day  . Balsam [place]-Castor Oil (VENELEX) OINT Apply to sacrum and bilateral buttocks qshift & prn for prevention   . docusate (COLACE) 50 MG/5ML liquid Give 10 ml by mouth one a day  . metoCLOPramide (REGLAN) 5 MG tablet Take 5 mg by mouth 2 (two) times daily.  . NON FORMULARY Diet Type:  Full Liquids only - Nectar thick liquids  . Olopatadine HCl 0.2 % SOLN Place 1 drop into both eyes daily as needed.  [name] omeprazole (PRILOSEC) 20 MG capsule Take 20 mg by mouth daily.  . ondansetron (ZOFRAN) 4 MG tablet Give 1 tablet by mouth two times daily and every 8 hours as needed  . polyethylene glycol (MIRALAX / GLYCOLAX) packet Take 17 g by mouth 2 (two) times daily.   [name] senna-docusate (SENOKOT-S) 8.6-50 MG tablet Take 2 tablets by mouth at bedtime.    No facility-administered encounter medications on file as of 08/02/2018.     Review of Systems  Unable to perform ROS: Dementia    Immunization History  Administered Date(s) Administered  . Influenza-Unspecified 02/13/2014, 02/09/2016, 02/10/2017, 02/08/2018  . Pneumococcal Conjugate-13 02/10/2017  . Pneumococcal Polysaccharide-23 02/29/2016  . Tdap 01/30/2017   Pertinent  Health Maintenance Due  Topic Date Due  . INFLUENZA VACCINE  Completed  . DEXA SCAN  Completed  . PNA vac Low Risk Adult  Completed   Fall Risk  02/06/2018 01/26/2017  Falls in the past year? No No   Functional Status Survey:    Vitals:   08/02/18 1146  BP: 125/87  Pulse: 75  Resp: 20  Temp: (!) 97.5 F (36.4 C)  Weight: 149 lb 9.6 oz (67.9 kg)  Height: 5\' 7"  (1.702 m)   Body mass index is 23.43 kg/m. Physical Exam Vitals signs reviewed.  Constitutional:      Appearance: Normal appearance.  HENT:     Head: Normocephalic.     Nose: Nose normal.     Mouth/Throat:     Mouth: Mucous membranes are moist.     Pharynx: Oropharynx is clear.  Eyes:     Pupils: Pupils are equal, round, and reactive to light.  Neck:     Musculoskeletal: Neck supple.  Cardiovascular:     Rate and Rhythm: Normal rate and regular rhythm.     Pulses: Normal pulses.     Heart sounds: Normal  heart sounds.  Pulmonary:     Effort: Pulmonary effort is normal. No respiratory distress.     Breath sounds: Normal breath sounds. No wheezing or rales.  Abdominal:     General: Abdomen is flat. Bowel sounds are normal.     Palpations: Abdomen is soft.  Musculoskeletal:     Comments: Mild swelling Bilaterl  Skin:    General: Skin is warm and dry.  Neurological:     General: No focal deficit present.     Mental Status: She is alert.  Psychiatric:        Mood and Affect: Mood normal.  Behavior: Behavior normal.     Labs reviewed: Recent Labs    02/21/18 0700 05/09/18 0807 06/21/18 1009  NA 145 143 149*  K 3.5 4.2 3.8  CL 113* 107 110  CO2 24 29 26   GLUCOSE 102* 91 141*  BUN 44* 57* 38*  CREATININE 1.78* 2.12* 2.10*  CALCIUM 9.6 9.5 10.0   Recent Labs    02/07/18 0858  AST 20  ALT 23  ALKPHOS 69  BILITOT 0.5  PROT 6.8  ALBUMIN 3.5   Recent Labs    02/07/18 0858 05/09/18 0807 06/21/18 1009  WBC 5.3 7.4 6.2  NEUTROABS 3.3 5.3 4.5  HGB 12.4 13.4 14.6  HCT 38.7 43.8 48.1*  MCV 91.7 94.2 94.7  PLT 211 223 215   Lab Results  Component Value Date   TSH 0.823 04/10/2017   Lab Results  Component Value Date   HGBA1C  05/17/2007    5.4 (NOTE)   The ADA recommends the following therapeutic goals for glycemic   control related to Hgb A1C measurement:   Goal of Therapy:   < 7.0% Hgb A1C   Action Suggested:  > 8.0% Hgb A1C   Ref:  Diabetes Care, 22, Suppl. 1, 1999   Lab Results  Component Value Date   CHOL  05/22/2007    175        ATP III CLASSIFICATION:  <200     mg/dL   Desirable  200-239  mg/dL   Borderline High  >=240    mg/dL   High   HDL 37 (L) 05/22/2007   LDLCALC  05/22/2007    96        Total Cholesterol/HDL:CHD Risk Coronary Heart Disease Risk Table                     Men   Women  1/2 Average Risk   3.4   3.3   TRIG 208 (H) 05/22/2007   CHOLHDL 4.7 05/22/2007    Significant Diagnostic Results in last 30 days:  No results found.   Assessment/Plan Dysphagia and gastroparesis Patient is now on Reglan 5 mg twice daily.  Continue on Zofran No chest x-ray UA Continue Tylenol PRN for low-grade temp Also on Prilosec Recent Lethargy To be due to worsening dementia or dehydration At this time no further work-up .Alzheimer's dementia Continuesto be full Care.She is comfort care No More Aggressive Treatment CKD Stage 4 Worsening of her renal function Nomore work-up  End-of-life issues I had a discussion with her daughter  who cannot come and see her due to covid 19  restrictions.  I reassured her that patient is comfortable and the nursing staff and I can take care of her needs in the facility.  At this time we are minimizing the outside staff to come and visit the patient so would not make hospice consult unless we are not able to take care of her ourself. Our goal with Mrs Nayak is to keep her comfortable with no Aggressive work ups.  Family/ staff Communication:   Labs/tests ordered:   Total time spent in this patient care encounter was _ minutes; greater than 50% of the visit spent counseling Daughter, reviewing records , Labs and coordinating care for problems addressed at this encounter.

## 2018-08-07 DIAGNOSIS — I10 Essential (primary) hypertension: Secondary | ICD-10-CM | POA: Diagnosis not present

## 2018-08-07 DIAGNOSIS — K219 Gastro-esophageal reflux disease without esophagitis: Secondary | ICD-10-CM | POA: Diagnosis not present

## 2018-08-07 DIAGNOSIS — F0391 Unspecified dementia with behavioral disturbance: Secondary | ICD-10-CM | POA: Diagnosis not present

## 2018-08-07 DIAGNOSIS — R1311 Dysphagia, oral phase: Secondary | ICD-10-CM | POA: Diagnosis not present

## 2018-08-09 ENCOUNTER — Non-Acute Institutional Stay (SKILLED_NURSING_FACILITY): Payer: Medicare Other | Admitting: Internal Medicine

## 2018-08-09 ENCOUNTER — Encounter: Payer: Self-pay | Admitting: Internal Medicine

## 2018-08-09 ENCOUNTER — Encounter (HOSPITAL_COMMUNITY)
Admission: RE | Admit: 2018-08-09 | Discharge: 2018-08-09 | Disposition: A | Discharge status: Home / Self Care | Payer: Medicare Other | Source: Skilled Nursing Facility | Attending: Internal Medicine | Admitting: Internal Medicine

## 2018-08-09 DIAGNOSIS — E870 Hyperosmolality and hypernatremia: Secondary | ICD-10-CM

## 2018-08-09 DIAGNOSIS — R627 Adult failure to thrive: Secondary | ICD-10-CM

## 2018-08-09 DIAGNOSIS — K5900 Constipation, unspecified: Secondary | ICD-10-CM | POA: Diagnosis not present

## 2018-08-09 DIAGNOSIS — R1312 Dysphagia, oropharyngeal phase: Secondary | ICD-10-CM | POA: Diagnosis not present

## 2018-08-09 DIAGNOSIS — I10 Essential (primary) hypertension: Secondary | ICD-10-CM | POA: Insufficient documentation

## 2018-08-09 DIAGNOSIS — M6281 Muscle weakness (generalized): Secondary | ICD-10-CM | POA: Insufficient documentation

## 2018-08-09 DIAGNOSIS — F0390 Unspecified dementia without behavioral disturbance: Secondary | ICD-10-CM

## 2018-08-09 DIAGNOSIS — H1045 Other chronic allergic conjunctivitis: Secondary | ICD-10-CM | POA: Diagnosis not present

## 2018-08-09 DIAGNOSIS — R1314 Dysphagia, pharyngoesophageal phase: Secondary | ICD-10-CM

## 2018-08-09 DIAGNOSIS — F319 Bipolar disorder, unspecified: Secondary | ICD-10-CM | POA: Insufficient documentation

## 2018-08-09 DIAGNOSIS — F03C0 Unspecified dementia, severe, without behavioral disturbance, psychotic disturbance, mood disturbance, and anxiety: Secondary | ICD-10-CM

## 2018-08-09 LAB — CBC WITH DIFFERENTIAL/PLATELET
Abs Immature Granulocytes: 0.02 10*3/uL (ref 0.00–0.07)
Basophils Absolute: 0.1 10*3/uL (ref 0.0–0.1)
Basophils Relative: 1 %
Eosinophils Absolute: 0.4 10*3/uL (ref 0.0–0.5)
Eosinophils Relative: 4 %
HCT: 51.8 % — ABNORMAL HIGH (ref 36.0–46.0)
Hemoglobin: 15.1 g/dL — ABNORMAL HIGH (ref 12.0–15.0)
Immature Granulocytes: 0 %
Lymphocytes Relative: 17 %
Lymphs Abs: 1.6 10*3/uL (ref 0.7–4.0)
MCH: 29 pg (ref 26.0–34.0)
MCHC: 29.2 g/dL — ABNORMAL LOW (ref 30.0–36.0)
MCV: 99.4 fL (ref 80.0–100.0)
Monocytes Absolute: 0.9 10*3/uL (ref 0.1–1.0)
Monocytes Relative: 9 %
Neutro Abs: 6.7 10*3/uL (ref 1.7–7.7)
Neutrophils Relative %: 69 %
Platelets: 165 10*3/uL (ref 150–400)
RBC: 5.21 MIL/uL — ABNORMAL HIGH (ref 3.87–5.11)
RDW: 14.1 % (ref 11.5–15.5)
WBC: 9.6 10*3/uL (ref 4.0–10.5)
nRBC: 0 % (ref 0.0–0.2)

## 2018-08-09 LAB — BASIC METABOLIC PANEL
Anion gap: 13 (ref 5–15)
BUN: 79 mg/dL — ABNORMAL HIGH (ref 8–23)
CO2: 22 mmol/L (ref 22–32)
Calcium: 9.3 mg/dL (ref 8.9–10.3)
Chloride: 116 mmol/L — ABNORMAL HIGH (ref 98–111)
Creatinine, Ser: 2.82 mg/dL — ABNORMAL HIGH (ref 0.44–1.00)
GFR calc Af Amer: 18 mL/min — ABNORMAL LOW (ref >60)
GFR calc non Af Amer: 15 mL/min — ABNORMAL LOW (ref >60)
Glucose, Bld: 98 mg/dL (ref 70–99)
Potassium: 3.6 mmol/L (ref 3.5–5.1)
Sodium: 151 mmol/L — ABNORMAL HIGH (ref 135–145)

## 2018-08-09 NOTE — Progress Notes (Signed)
Location:  [facility] Room Number: [place] of Service:  SNF [phone]) Provider:  Veleta Miners, MD  [name], MD  Patient Care Team: [name], MD as PCP - General (Internal Medicine) [name] [name], MD as Consulting Physician (Gastroenterology)  Extended Emergency Contact Information Primary Emergency Contact: [facility] Address: [address]          [place], [place] [address] [name] of [place] Phone: [phone] Work Phone: [phone] Mobile Phone: [phone] Relation: Daughter Secondary Emergency Contact: [name] Address: [address]          [place], [place] [address] [name] of [place] Phone: [phone] Mobile Phone: [phone] Relation: Relative  Code Status:  DNR Goals of care: Advanced Directive information Advanced Directives 08/09/2018  Does Patient Have a Medical Advance Directive? Yes  Type of Advance Directive Out of facility DNR (pink MOST or yellow form)  Does patient want to make changes to medical advance directive? No - Patient declined  Copy of [place] in Chart? -  Would patient like information on creating a medical advance directive? No - Patient declined  Pre-existing out of facility DNR order (yellow form or pink MOST form) Yellow form placed in chart (order not valid for inpatient use);Pink MOST form placed in chart (order not valid for inpatient use)     Chief Complaint  Patient presents with  . Acute Visit    Weight Loss    HPI:  Pt is a 83 y.o. female seen today for an acute visit for Severe Weight loss and End of Life Patient has h/o hypertension, Advanced Dementia, CKD stge 4, Breast cancer s/p Mastectomy, GERD and Gastroparesis. Patient was seen today as she has lost weight almost 10 lbs in past few weeks. At her POA request we did BMP and it shows Sodium of 151 and BUN of 79. Patient is lethargic. She does respond and open her eyes but barely  responds. Very dry mucous membranes. It has been d/w the POA and she wants Hospice consult   Past Medical History:  Diagnosis Date  . Cancer ([place])   . Dementia ([place])   . Gallstones   . Gastroparalysis   . HTN (hypertension)   . Infiltrating ductal carcinoma of right female breast ([place]) 12/02/2009   Qualifier: History of  By: [name] CMA [name]), June    . Osteoporosis 11/18/2011  . Reflux   . Renal disorder    renal insufficiency.    Past Surgical History:  Procedure Laterality Date  . BREAST BIOPSY    . BREAST LUMPECTOMY    . COLONOSCOPY  2009   Dr. Fuller Plan: normal  . ESOPHAGOGASTRODUODENOSCOPY  12/11/2009   Dr. Fuller Plan: hiatal hernia  . ESOPHAGOGASTRODUODENOSCOPY N/A 01/29/2014   Procedure: ESOPHAGOGASTRODUODENOSCOPY (EGD);  Surgeon: [name], MD;  [facility];  Service: Endoscopy;  Laterality: N/A;  245  . TONSILLECTOMY AND ADENOIDECTOMY      Allergies  Allergen Reactions  . Aricept [Donepezil Hydrochloride] Nausea And Vomiting  . Exelon [Rivastigmine Tartrate]   . Lithium Nausea And Vomiting    Outpatient Encounter Medications as of 08/09/2018  Medication Sig  . acetaminophen (TYLENOL) 325 MG tablet Take two tablets by mouth twice a day, take 650 mg by mouth every 6 hours prn  . amLODipine (NORVASC) 2.5 MG tablet Take 2.5 mg by mouth along with 5 mg to = 7.5 mg once a day  . amLODipine (NORVASC) 5 MG tablet Take  5 mg by mouth along with 2.5 mg to = 7.5 mg once a day  . Balsam [place]-Castor Oil (VENELEX) OINT Apply to sacrum and bilateral buttocks qshift & prn for prevention  . docusate (COLACE) 50 MG/5ML liquid Give 10 ml by mouth one a day  . metoCLOPramide (REGLAN) 5 MG tablet Take 5 mg by mouth 2 (two) times daily.  . NON FORMULARY Diet Type:  Full Liquids only - Nectar thick liquids  . Olopatadine HCl 0.2 % SOLN Place 1 drop into both eyes daily as needed.  [name] omeprazole (PRILOSEC) 20 MG capsule Take 20 mg by mouth daily.  . ondansetron (ZOFRAN) 4 MG  tablet Give 1 tablet by mouth two times daily and every 8 hours as needed  . polyethylene glycol (MIRALAX / GLYCOLAX) packet Take 17 g by mouth 2 (two) times daily as needed.   . senna-docusate (SENOKOT-S) 8.6-50 MG tablet Take 2 tablets by mouth at bedtime.    No facility-administered encounter medications on file as of 08/09/2018.     Review of Systems  Unable to perform ROS: Dementia    Immunization History  Administered Date(s) Administered  . Influenza-Unspecified 02/13/2014, 02/09/2016, 02/10/2017, 02/08/2018  . Pneumococcal Conjugate-13 02/10/2017  . Pneumococcal Polysaccharide-23 02/29/2016  . Tdap 01/30/2017   Pertinent  Health Maintenance Due  Topic Date Due  . INFLUENZA VACCINE  12/08/2018  . DEXA SCAN  Completed  . PNA vac Low Risk Adult  Completed   Fall Risk  02/06/2018 01/26/2017  Falls in the past year? No No   Functional Status Survey:    Vitals:   08/09/18 1030  BP: 134/71  Pulse: 60  Resp: 16  Temp: (!) 97 F (36.1 C)  Weight: 136 lb (61.7 kg)  Height: 5\' 7"  (1.702 m)   Body mass index is 21.3 kg/m. Physical Exam Vitals signs reviewed.  HENT:     Head: Normocephalic.     Nose: Nose normal.     Mouth/Throat:     Mouth: Mucous membranes are dry.  Eyes:     Pupils: Pupils are equal, round, and reactive to light.  Neck:     Musculoskeletal: Neck supple.  Cardiovascular:     Rate and Rhythm: Normal rate and regular rhythm.  Pulmonary:     Effort: Pulmonary effort is normal. No respiratory distress.     Breath sounds: Normal breath sounds.  Abdominal:     General: Abdomen is flat. Bowel sounds are normal.     Palpations: Abdomen is soft.  Musculoskeletal:        General: No swelling.  Skin:    General: Skin is warm and dry.  Neurological:     Mental Status: She is lethargic.     Comments: Patient lethargic Hardly opens her Eyes does respond but does not follow any commands  Psychiatric:     Comments: Cannot assess     Labs reviewed:  Recent Labs    05/09/18 0807 06/21/18 1009 08/09/18 0718  NA 143 149* 151*  K 4.2 3.8 3.6  CL 107 110 116*  CO2 29 26 22   GLUCOSE 91 141* 98  BUN 57* 38* 79*  CREATININE 2.12* 2.10* 2.82*  CALCIUM 9.5 10.0 9.3   Recent Labs    02/07/18 0858  AST 20  ALT 23  ALKPHOS 69  BILITOT 0.5  PROT 6.8  ALBUMIN 3.5   Recent Labs    05/09/18 0807 06/21/18 1009 08/09/18 0718  WBC 7.4 6.2 9.6  NEUTROABS 5.3 4.5  6.7  HGB 13.4 14.6 15.1*  HCT 43.8 48.1* 51.8*  MCV 94.2 94.7 99.4  PLT 223 215 165   Lab Results  Component Value Date   TSH 0.823 04/10/2017   Lab Results  Component Value Date   HGBA1C  05/17/2007    5.4 (NOTE)   The ADA recommends the following therapeutic goals for glycemic   control related to Hgb A1C measurement:   Goal of Therapy:   < 7.0% Hgb A1C   Action Suggested:  > 8.0% Hgb A1C   Ref:  Diabetes Care, 22, Suppl. 1, 1999   Lab Results  Component Value Date   CHOL  05/22/2007    175        ATP III CLASSIFICATION:  <200     mg/dL   Desirable  200-239  mg/dL   Borderline High  >=240    mg/dL   High   HDL 37 (L) 05/22/2007   LDLCALC  05/22/2007    96        Total Cholesterol/HDL:CHD Risk Coronary Heart Disease Risk Table                     Men   Women  1/2 Average Risk   3.4   3.3   TRIG 208 (H) 05/22/2007   CHOLHDL 4.7 05/22/2007    Significant Diagnostic Results in last 30 days:  No results found.  Assessment/Plan End of Life Patient with Advance dementia, Failure to thrive and now Hypernatremia and Acute renal Failure. She has change of status and with Worsen mentation.  Continue on Zofran  Possibly starting on Morphine and Ativan for comfort Hospice consult  Nurses Have d/w her daughter and she wants to take her home with in home Hospice   Family/ staff Communication:  Labs/tests ordered:   Total time spent in this patient care encounter was 25_ minutes; greater than 50% of the visit spent with d/w staff for further management  reviewing records , Labs and coordinating care for problems addressed at this encounter.

## 2018-08-10 DIAGNOSIS — K219 Gastro-esophageal reflux disease without esophagitis: Secondary | ICD-10-CM | POA: Diagnosis not present

## 2018-08-10 DIAGNOSIS — F0280 Dementia in other diseases classified elsewhere without behavioral disturbance: Secondary | ICD-10-CM | POA: Diagnosis not present

## 2018-08-10 DIAGNOSIS — F319 Bipolar disorder, unspecified: Secondary | ICD-10-CM | POA: Diagnosis not present

## 2018-08-10 DIAGNOSIS — R634 Abnormal weight loss: Secondary | ICD-10-CM | POA: Diagnosis not present

## 2018-08-10 DIAGNOSIS — N184 Chronic kidney disease, stage 4 (severe): Secondary | ICD-10-CM | POA: Diagnosis not present

## 2018-08-10 DIAGNOSIS — Z853 Personal history of malignant neoplasm of breast: Secondary | ICD-10-CM | POA: Diagnosis not present

## 2018-08-10 DIAGNOSIS — I129 Hypertensive chronic kidney disease with stage 1 through stage 4 chronic kidney disease, or unspecified chronic kidney disease: Secondary | ICD-10-CM | POA: Diagnosis not present

## 2018-08-10 DIAGNOSIS — G309 Alzheimer's disease, unspecified: Secondary | ICD-10-CM | POA: Diagnosis not present

## 2018-08-20 DIAGNOSIS — F319 Bipolar disorder, unspecified: Secondary | ICD-10-CM | POA: Diagnosis not present

## 2018-08-20 DIAGNOSIS — I129 Hypertensive chronic kidney disease with stage 1 through stage 4 chronic kidney disease, or unspecified chronic kidney disease: Secondary | ICD-10-CM | POA: Diagnosis not present

## 2018-08-20 DIAGNOSIS — N184 Chronic kidney disease, stage 4 (severe): Secondary | ICD-10-CM | POA: Diagnosis not present

## 2018-08-20 DIAGNOSIS — G309 Alzheimer's disease, unspecified: Secondary | ICD-10-CM | POA: Diagnosis not present

## 2018-08-20 DIAGNOSIS — F0280 Dementia in other diseases classified elsewhere without behavioral disturbance: Secondary | ICD-10-CM | POA: Diagnosis not present

## 2018-08-20 DIAGNOSIS — R634 Abnormal weight loss: Secondary | ICD-10-CM | POA: Diagnosis not present

## 2018-08-21 DIAGNOSIS — I10 Essential (primary) hypertension: Secondary | ICD-10-CM | POA: Diagnosis not present

## 2018-08-21 DIAGNOSIS — E43 Unspecified severe protein-calorie malnutrition: Secondary | ICD-10-CM | POA: Diagnosis not present

## 2018-08-21 DIAGNOSIS — F0390 Unspecified dementia without behavioral disturbance: Secondary | ICD-10-CM | POA: Diagnosis not present

## 2018-08-21 DIAGNOSIS — K5900 Constipation, unspecified: Secondary | ICD-10-CM | POA: Diagnosis not present

## 2018-08-21 DIAGNOSIS — Z0000 Encounter for general adult medical examination without abnormal findings: Secondary | ICD-10-CM | POA: Diagnosis not present

## 2018-08-21 DIAGNOSIS — H1011 Acute atopic conjunctivitis, right eye: Secondary | ICD-10-CM | POA: Diagnosis not present

## 2018-09-07 DIAGNOSIS — F0280 Dementia in other diseases classified elsewhere without behavioral disturbance: Secondary | ICD-10-CM | POA: Diagnosis not present

## 2018-09-07 DIAGNOSIS — R634 Abnormal weight loss: Secondary | ICD-10-CM | POA: Diagnosis not present

## 2018-09-07 DIAGNOSIS — Z853 Personal history of malignant neoplasm of breast: Secondary | ICD-10-CM | POA: Diagnosis not present

## 2018-09-07 DIAGNOSIS — I129 Hypertensive chronic kidney disease with stage 1 through stage 4 chronic kidney disease, or unspecified chronic kidney disease: Secondary | ICD-10-CM | POA: Diagnosis not present

## 2018-09-07 DIAGNOSIS — N184 Chronic kidney disease, stage 4 (severe): Secondary | ICD-10-CM | POA: Diagnosis not present

## 2018-09-07 DIAGNOSIS — F319 Bipolar disorder, unspecified: Secondary | ICD-10-CM | POA: Diagnosis not present

## 2018-09-07 DIAGNOSIS — G309 Alzheimer's disease, unspecified: Secondary | ICD-10-CM | POA: Diagnosis not present

## 2018-09-07 DIAGNOSIS — K219 Gastro-esophageal reflux disease without esophagitis: Secondary | ICD-10-CM | POA: Diagnosis not present

## 2018-09-12 DIAGNOSIS — N184 Chronic kidney disease, stage 4 (severe): Secondary | ICD-10-CM | POA: Diagnosis not present

## 2018-09-12 DIAGNOSIS — G309 Alzheimer's disease, unspecified: Secondary | ICD-10-CM | POA: Diagnosis not present

## 2018-09-12 DIAGNOSIS — F319 Bipolar disorder, unspecified: Secondary | ICD-10-CM | POA: Diagnosis not present

## 2018-09-12 DIAGNOSIS — I129 Hypertensive chronic kidney disease with stage 1 through stage 4 chronic kidney disease, or unspecified chronic kidney disease: Secondary | ICD-10-CM | POA: Diagnosis not present

## 2018-09-12 DIAGNOSIS — F0280 Dementia in other diseases classified elsewhere without behavioral disturbance: Secondary | ICD-10-CM | POA: Diagnosis not present

## 2018-09-12 DIAGNOSIS — R634 Abnormal weight loss: Secondary | ICD-10-CM | POA: Diagnosis not present

## 2018-10-02 DIAGNOSIS — F319 Bipolar disorder, unspecified: Secondary | ICD-10-CM | POA: Diagnosis not present

## 2018-10-02 DIAGNOSIS — F0280 Dementia in other diseases classified elsewhere without behavioral disturbance: Secondary | ICD-10-CM | POA: Diagnosis not present

## 2018-10-02 DIAGNOSIS — I129 Hypertensive chronic kidney disease with stage 1 through stage 4 chronic kidney disease, or unspecified chronic kidney disease: Secondary | ICD-10-CM | POA: Diagnosis not present

## 2018-10-02 DIAGNOSIS — R634 Abnormal weight loss: Secondary | ICD-10-CM | POA: Diagnosis not present

## 2018-10-02 DIAGNOSIS — G309 Alzheimer's disease, unspecified: Secondary | ICD-10-CM | POA: Diagnosis not present

## 2018-10-02 DIAGNOSIS — N184 Chronic kidney disease, stage 4 (severe): Secondary | ICD-10-CM | POA: Diagnosis not present

## 2018-10-08 DIAGNOSIS — K219 Gastro-esophageal reflux disease without esophagitis: Secondary | ICD-10-CM | POA: Diagnosis not present

## 2018-10-08 DIAGNOSIS — G309 Alzheimer's disease, unspecified: Secondary | ICD-10-CM | POA: Diagnosis not present

## 2018-10-08 DIAGNOSIS — F319 Bipolar disorder, unspecified: Secondary | ICD-10-CM | POA: Diagnosis not present

## 2018-10-08 DIAGNOSIS — F0280 Dementia in other diseases classified elsewhere without behavioral disturbance: Secondary | ICD-10-CM | POA: Diagnosis not present

## 2018-10-08 DIAGNOSIS — N184 Chronic kidney disease, stage 4 (severe): Secondary | ICD-10-CM | POA: Diagnosis not present

## 2018-10-08 DIAGNOSIS — R634 Abnormal weight loss: Secondary | ICD-10-CM | POA: Diagnosis not present

## 2018-10-08 DIAGNOSIS — Z853 Personal history of malignant neoplasm of breast: Secondary | ICD-10-CM | POA: Diagnosis not present

## 2018-10-08 DIAGNOSIS — I129 Hypertensive chronic kidney disease with stage 1 through stage 4 chronic kidney disease, or unspecified chronic kidney disease: Secondary | ICD-10-CM | POA: Diagnosis not present

## 2018-10-11 DIAGNOSIS — G309 Alzheimer's disease, unspecified: Secondary | ICD-10-CM | POA: Diagnosis not present

## 2018-10-11 DIAGNOSIS — I129 Hypertensive chronic kidney disease with stage 1 through stage 4 chronic kidney disease, or unspecified chronic kidney disease: Secondary | ICD-10-CM | POA: Diagnosis not present

## 2018-10-11 DIAGNOSIS — N184 Chronic kidney disease, stage 4 (severe): Secondary | ICD-10-CM | POA: Diagnosis not present

## 2018-10-11 DIAGNOSIS — R634 Abnormal weight loss: Secondary | ICD-10-CM | POA: Diagnosis not present

## 2018-10-11 DIAGNOSIS — F0280 Dementia in other diseases classified elsewhere without behavioral disturbance: Secondary | ICD-10-CM | POA: Diagnosis not present

## 2018-10-11 DIAGNOSIS — F319 Bipolar disorder, unspecified: Secondary | ICD-10-CM | POA: Diagnosis not present

## 2018-10-17 DIAGNOSIS — E43 Unspecified severe protein-calorie malnutrition: Secondary | ICD-10-CM | POA: Diagnosis not present

## 2018-10-17 DIAGNOSIS — F319 Bipolar disorder, unspecified: Secondary | ICD-10-CM | POA: Diagnosis not present

## 2018-10-17 DIAGNOSIS — R634 Abnormal weight loss: Secondary | ICD-10-CM | POA: Diagnosis not present

## 2018-10-17 DIAGNOSIS — F0280 Dementia in other diseases classified elsewhere without behavioral disturbance: Secondary | ICD-10-CM | POA: Diagnosis not present

## 2018-10-17 DIAGNOSIS — R627 Adult failure to thrive: Secondary | ICD-10-CM | POA: Diagnosis not present

## 2018-10-17 DIAGNOSIS — I129 Hypertensive chronic kidney disease with stage 1 through stage 4 chronic kidney disease, or unspecified chronic kidney disease: Secondary | ICD-10-CM | POA: Diagnosis not present

## 2018-10-17 DIAGNOSIS — G309 Alzheimer's disease, unspecified: Secondary | ICD-10-CM | POA: Diagnosis not present

## 2018-10-17 DIAGNOSIS — H1013 Acute atopic conjunctivitis, bilateral: Secondary | ICD-10-CM | POA: Diagnosis not present

## 2018-10-17 DIAGNOSIS — F0390 Unspecified dementia without behavioral disturbance: Secondary | ICD-10-CM | POA: Diagnosis not present

## 2018-10-17 DIAGNOSIS — N184 Chronic kidney disease, stage 4 (severe): Secondary | ICD-10-CM | POA: Diagnosis not present

## 2018-10-31 DIAGNOSIS — I129 Hypertensive chronic kidney disease with stage 1 through stage 4 chronic kidney disease, or unspecified chronic kidney disease: Secondary | ICD-10-CM | POA: Diagnosis not present

## 2018-10-31 DIAGNOSIS — G309 Alzheimer's disease, unspecified: Secondary | ICD-10-CM | POA: Diagnosis not present

## 2018-10-31 DIAGNOSIS — R634 Abnormal weight loss: Secondary | ICD-10-CM | POA: Diagnosis not present

## 2018-10-31 DIAGNOSIS — F319 Bipolar disorder, unspecified: Secondary | ICD-10-CM | POA: Diagnosis not present

## 2018-10-31 DIAGNOSIS — F0280 Dementia in other diseases classified elsewhere without behavioral disturbance: Secondary | ICD-10-CM | POA: Diagnosis not present

## 2018-10-31 DIAGNOSIS — N184 Chronic kidney disease, stage 4 (severe): Secondary | ICD-10-CM | POA: Diagnosis not present

## 2018-11-07 DIAGNOSIS — F0280 Dementia in other diseases classified elsewhere without behavioral disturbance: Secondary | ICD-10-CM | POA: Diagnosis not present

## 2018-11-07 DIAGNOSIS — N184 Chronic kidney disease, stage 4 (severe): Secondary | ICD-10-CM | POA: Diagnosis not present

## 2018-11-07 DIAGNOSIS — K219 Gastro-esophageal reflux disease without esophagitis: Secondary | ICD-10-CM | POA: Diagnosis not present

## 2018-11-07 DIAGNOSIS — I129 Hypertensive chronic kidney disease with stage 1 through stage 4 chronic kidney disease, or unspecified chronic kidney disease: Secondary | ICD-10-CM | POA: Diagnosis not present

## 2018-11-07 DIAGNOSIS — R634 Abnormal weight loss: Secondary | ICD-10-CM | POA: Diagnosis not present

## 2018-11-07 DIAGNOSIS — F319 Bipolar disorder, unspecified: Secondary | ICD-10-CM | POA: Diagnosis not present

## 2018-11-07 DIAGNOSIS — G309 Alzheimer's disease, unspecified: Secondary | ICD-10-CM | POA: Diagnosis not present

## 2018-11-07 DIAGNOSIS — Z853 Personal history of malignant neoplasm of breast: Secondary | ICD-10-CM | POA: Diagnosis not present

## 2018-11-11 DIAGNOSIS — G309 Alzheimer's disease, unspecified: Secondary | ICD-10-CM | POA: Diagnosis not present

## 2018-11-11 DIAGNOSIS — N184 Chronic kidney disease, stage 4 (severe): Secondary | ICD-10-CM | POA: Diagnosis not present

## 2018-11-11 DIAGNOSIS — F319 Bipolar disorder, unspecified: Secondary | ICD-10-CM | POA: Diagnosis not present

## 2018-11-11 DIAGNOSIS — F0280 Dementia in other diseases classified elsewhere without behavioral disturbance: Secondary | ICD-10-CM | POA: Diagnosis not present

## 2018-11-11 DIAGNOSIS — I129 Hypertensive chronic kidney disease with stage 1 through stage 4 chronic kidney disease, or unspecified chronic kidney disease: Secondary | ICD-10-CM | POA: Diagnosis not present

## 2018-11-11 DIAGNOSIS — R634 Abnormal weight loss: Secondary | ICD-10-CM | POA: Diagnosis not present

## 2018-11-14 DIAGNOSIS — N184 Chronic kidney disease, stage 4 (severe): Secondary | ICD-10-CM | POA: Diagnosis not present

## 2018-11-14 DIAGNOSIS — R634 Abnormal weight loss: Secondary | ICD-10-CM | POA: Diagnosis not present

## 2018-11-14 DIAGNOSIS — G309 Alzheimer's disease, unspecified: Secondary | ICD-10-CM | POA: Diagnosis not present

## 2018-11-14 DIAGNOSIS — F319 Bipolar disorder, unspecified: Secondary | ICD-10-CM | POA: Diagnosis not present

## 2018-11-14 DIAGNOSIS — F0280 Dementia in other diseases classified elsewhere without behavioral disturbance: Secondary | ICD-10-CM | POA: Diagnosis not present

## 2018-11-14 DIAGNOSIS — I129 Hypertensive chronic kidney disease with stage 1 through stage 4 chronic kidney disease, or unspecified chronic kidney disease: Secondary | ICD-10-CM | POA: Diagnosis not present

## 2018-11-21 DIAGNOSIS — F319 Bipolar disorder, unspecified: Secondary | ICD-10-CM | POA: Diagnosis not present

## 2018-11-21 DIAGNOSIS — R634 Abnormal weight loss: Secondary | ICD-10-CM | POA: Diagnosis not present

## 2018-11-21 DIAGNOSIS — N184 Chronic kidney disease, stage 4 (severe): Secondary | ICD-10-CM | POA: Diagnosis not present

## 2018-11-21 DIAGNOSIS — I129 Hypertensive chronic kidney disease with stage 1 through stage 4 chronic kidney disease, or unspecified chronic kidney disease: Secondary | ICD-10-CM | POA: Diagnosis not present

## 2018-11-21 DIAGNOSIS — G309 Alzheimer's disease, unspecified: Secondary | ICD-10-CM | POA: Diagnosis not present

## 2018-11-21 DIAGNOSIS — F0280 Dementia in other diseases classified elsewhere without behavioral disturbance: Secondary | ICD-10-CM | POA: Diagnosis not present

## 2018-11-28 DIAGNOSIS — G309 Alzheimer's disease, unspecified: Secondary | ICD-10-CM | POA: Diagnosis not present

## 2018-11-28 DIAGNOSIS — F319 Bipolar disorder, unspecified: Secondary | ICD-10-CM | POA: Diagnosis not present

## 2018-11-28 DIAGNOSIS — I129 Hypertensive chronic kidney disease with stage 1 through stage 4 chronic kidney disease, or unspecified chronic kidney disease: Secondary | ICD-10-CM | POA: Diagnosis not present

## 2018-11-28 DIAGNOSIS — F0280 Dementia in other diseases classified elsewhere without behavioral disturbance: Secondary | ICD-10-CM | POA: Diagnosis not present

## 2018-11-28 DIAGNOSIS — R634 Abnormal weight loss: Secondary | ICD-10-CM | POA: Diagnosis not present

## 2018-11-28 DIAGNOSIS — N184 Chronic kidney disease, stage 4 (severe): Secondary | ICD-10-CM | POA: Diagnosis not present

## 2018-12-05 DIAGNOSIS — I129 Hypertensive chronic kidney disease with stage 1 through stage 4 chronic kidney disease, or unspecified chronic kidney disease: Secondary | ICD-10-CM | POA: Diagnosis not present

## 2018-12-05 DIAGNOSIS — R634 Abnormal weight loss: Secondary | ICD-10-CM | POA: Diagnosis not present

## 2018-12-05 DIAGNOSIS — F319 Bipolar disorder, unspecified: Secondary | ICD-10-CM | POA: Diagnosis not present

## 2018-12-05 DIAGNOSIS — G309 Alzheimer's disease, unspecified: Secondary | ICD-10-CM | POA: Diagnosis not present

## 2018-12-05 DIAGNOSIS — F0280 Dementia in other diseases classified elsewhere without behavioral disturbance: Secondary | ICD-10-CM | POA: Diagnosis not present

## 2018-12-05 DIAGNOSIS — N184 Chronic kidney disease, stage 4 (severe): Secondary | ICD-10-CM | POA: Diagnosis not present

## 2018-12-08 DIAGNOSIS — N184 Chronic kidney disease, stage 4 (severe): Secondary | ICD-10-CM | POA: Diagnosis not present

## 2018-12-08 DIAGNOSIS — F319 Bipolar disorder, unspecified: Secondary | ICD-10-CM | POA: Diagnosis not present

## 2018-12-08 DIAGNOSIS — K219 Gastro-esophageal reflux disease without esophagitis: Secondary | ICD-10-CM | POA: Diagnosis not present

## 2018-12-08 DIAGNOSIS — G309 Alzheimer's disease, unspecified: Secondary | ICD-10-CM | POA: Diagnosis not present

## 2018-12-08 DIAGNOSIS — Z853 Personal history of malignant neoplasm of breast: Secondary | ICD-10-CM | POA: Diagnosis not present

## 2018-12-08 DIAGNOSIS — F0280 Dementia in other diseases classified elsewhere without behavioral disturbance: Secondary | ICD-10-CM | POA: Diagnosis not present

## 2018-12-08 DIAGNOSIS — R634 Abnormal weight loss: Secondary | ICD-10-CM | POA: Diagnosis not present

## 2018-12-08 DIAGNOSIS — I129 Hypertensive chronic kidney disease with stage 1 through stage 4 chronic kidney disease, or unspecified chronic kidney disease: Secondary | ICD-10-CM | POA: Diagnosis not present

## 2018-12-10 DIAGNOSIS — N184 Chronic kidney disease, stage 4 (severe): Secondary | ICD-10-CM | POA: Diagnosis not present

## 2018-12-10 DIAGNOSIS — G309 Alzheimer's disease, unspecified: Secondary | ICD-10-CM | POA: Diagnosis not present

## 2018-12-10 DIAGNOSIS — F0280 Dementia in other diseases classified elsewhere without behavioral disturbance: Secondary | ICD-10-CM | POA: Diagnosis not present

## 2018-12-10 DIAGNOSIS — I129 Hypertensive chronic kidney disease with stage 1 through stage 4 chronic kidney disease, or unspecified chronic kidney disease: Secondary | ICD-10-CM | POA: Diagnosis not present

## 2018-12-10 DIAGNOSIS — F319 Bipolar disorder, unspecified: Secondary | ICD-10-CM | POA: Diagnosis not present

## 2018-12-10 DIAGNOSIS — R634 Abnormal weight loss: Secondary | ICD-10-CM | POA: Diagnosis not present

## 2018-12-19 DIAGNOSIS — F0280 Dementia in other diseases classified elsewhere without behavioral disturbance: Secondary | ICD-10-CM | POA: Diagnosis not present

## 2018-12-19 DIAGNOSIS — R634 Abnormal weight loss: Secondary | ICD-10-CM | POA: Diagnosis not present

## 2018-12-19 DIAGNOSIS — N184 Chronic kidney disease, stage 4 (severe): Secondary | ICD-10-CM | POA: Diagnosis not present

## 2018-12-19 DIAGNOSIS — I129 Hypertensive chronic kidney disease with stage 1 through stage 4 chronic kidney disease, or unspecified chronic kidney disease: Secondary | ICD-10-CM | POA: Diagnosis not present

## 2018-12-19 DIAGNOSIS — F319 Bipolar disorder, unspecified: Secondary | ICD-10-CM | POA: Diagnosis not present

## 2018-12-19 DIAGNOSIS — G309 Alzheimer's disease, unspecified: Secondary | ICD-10-CM | POA: Diagnosis not present

## 2018-12-26 DIAGNOSIS — G309 Alzheimer's disease, unspecified: Secondary | ICD-10-CM | POA: Diagnosis not present

## 2018-12-26 DIAGNOSIS — R627 Adult failure to thrive: Secondary | ICD-10-CM | POA: Diagnosis not present

## 2018-12-26 DIAGNOSIS — F319 Bipolar disorder, unspecified: Secondary | ICD-10-CM | POA: Diagnosis not present

## 2018-12-26 DIAGNOSIS — I10 Essential (primary) hypertension: Secondary | ICD-10-CM | POA: Diagnosis not present

## 2018-12-26 DIAGNOSIS — N184 Chronic kidney disease, stage 4 (severe): Secondary | ICD-10-CM | POA: Diagnosis not present

## 2018-12-26 DIAGNOSIS — R634 Abnormal weight loss: Secondary | ICD-10-CM | POA: Diagnosis not present

## 2018-12-26 DIAGNOSIS — E43 Unspecified severe protein-calorie malnutrition: Secondary | ICD-10-CM | POA: Diagnosis not present

## 2018-12-26 DIAGNOSIS — I129 Hypertensive chronic kidney disease with stage 1 through stage 4 chronic kidney disease, or unspecified chronic kidney disease: Secondary | ICD-10-CM | POA: Diagnosis not present

## 2018-12-26 DIAGNOSIS — F0390 Unspecified dementia without behavioral disturbance: Secondary | ICD-10-CM | POA: Diagnosis not present

## 2018-12-26 DIAGNOSIS — F0280 Dementia in other diseases classified elsewhere without behavioral disturbance: Secondary | ICD-10-CM | POA: Diagnosis not present

## 2019-01-02 DIAGNOSIS — F0280 Dementia in other diseases classified elsewhere without behavioral disturbance: Secondary | ICD-10-CM | POA: Diagnosis not present

## 2019-01-02 DIAGNOSIS — F319 Bipolar disorder, unspecified: Secondary | ICD-10-CM | POA: Diagnosis not present

## 2019-01-02 DIAGNOSIS — I129 Hypertensive chronic kidney disease with stage 1 through stage 4 chronic kidney disease, or unspecified chronic kidney disease: Secondary | ICD-10-CM | POA: Diagnosis not present

## 2019-01-02 DIAGNOSIS — R634 Abnormal weight loss: Secondary | ICD-10-CM | POA: Diagnosis not present

## 2019-01-02 DIAGNOSIS — N184 Chronic kidney disease, stage 4 (severe): Secondary | ICD-10-CM | POA: Diagnosis not present

## 2019-01-02 DIAGNOSIS — G309 Alzheimer's disease, unspecified: Secondary | ICD-10-CM | POA: Diagnosis not present

## 2019-01-08 DIAGNOSIS — G309 Alzheimer's disease, unspecified: Secondary | ICD-10-CM | POA: Diagnosis not present

## 2019-01-08 DIAGNOSIS — F0280 Dementia in other diseases classified elsewhere without behavioral disturbance: Secondary | ICD-10-CM | POA: Diagnosis not present

## 2019-01-08 DIAGNOSIS — Z853 Personal history of malignant neoplasm of breast: Secondary | ICD-10-CM | POA: Diagnosis not present

## 2019-01-08 DIAGNOSIS — I129 Hypertensive chronic kidney disease with stage 1 through stage 4 chronic kidney disease, or unspecified chronic kidney disease: Secondary | ICD-10-CM | POA: Diagnosis not present

## 2019-01-08 DIAGNOSIS — K219 Gastro-esophageal reflux disease without esophagitis: Secondary | ICD-10-CM | POA: Diagnosis not present

## 2019-01-08 DIAGNOSIS — R634 Abnormal weight loss: Secondary | ICD-10-CM | POA: Diagnosis not present

## 2019-01-08 DIAGNOSIS — F319 Bipolar disorder, unspecified: Secondary | ICD-10-CM | POA: Diagnosis not present

## 2019-01-08 DIAGNOSIS — N184 Chronic kidney disease, stage 4 (severe): Secondary | ICD-10-CM | POA: Diagnosis not present

## 2019-01-09 DIAGNOSIS — R634 Abnormal weight loss: Secondary | ICD-10-CM | POA: Diagnosis not present

## 2019-01-09 DIAGNOSIS — N184 Chronic kidney disease, stage 4 (severe): Secondary | ICD-10-CM | POA: Diagnosis not present

## 2019-01-09 DIAGNOSIS — F319 Bipolar disorder, unspecified: Secondary | ICD-10-CM | POA: Diagnosis not present

## 2019-01-09 DIAGNOSIS — I129 Hypertensive chronic kidney disease with stage 1 through stage 4 chronic kidney disease, or unspecified chronic kidney disease: Secondary | ICD-10-CM | POA: Diagnosis not present

## 2019-01-09 DIAGNOSIS — G309 Alzheimer's disease, unspecified: Secondary | ICD-10-CM | POA: Diagnosis not present

## 2019-01-09 DIAGNOSIS — F0280 Dementia in other diseases classified elsewhere without behavioral disturbance: Secondary | ICD-10-CM | POA: Diagnosis not present

## 2019-01-16 DIAGNOSIS — G309 Alzheimer's disease, unspecified: Secondary | ICD-10-CM | POA: Diagnosis not present

## 2019-01-16 DIAGNOSIS — F0280 Dementia in other diseases classified elsewhere without behavioral disturbance: Secondary | ICD-10-CM | POA: Diagnosis not present

## 2019-01-16 DIAGNOSIS — F319 Bipolar disorder, unspecified: Secondary | ICD-10-CM | POA: Diagnosis not present

## 2019-01-16 DIAGNOSIS — N184 Chronic kidney disease, stage 4 (severe): Secondary | ICD-10-CM | POA: Diagnosis not present

## 2019-01-16 DIAGNOSIS — R634 Abnormal weight loss: Secondary | ICD-10-CM | POA: Diagnosis not present

## 2019-01-16 DIAGNOSIS — I129 Hypertensive chronic kidney disease with stage 1 through stage 4 chronic kidney disease, or unspecified chronic kidney disease: Secondary | ICD-10-CM | POA: Diagnosis not present

## 2019-01-23 DIAGNOSIS — F0280 Dementia in other diseases classified elsewhere without behavioral disturbance: Secondary | ICD-10-CM | POA: Diagnosis not present

## 2019-01-23 DIAGNOSIS — N184 Chronic kidney disease, stage 4 (severe): Secondary | ICD-10-CM | POA: Diagnosis not present

## 2019-01-23 DIAGNOSIS — F319 Bipolar disorder, unspecified: Secondary | ICD-10-CM | POA: Diagnosis not present

## 2019-01-23 DIAGNOSIS — R634 Abnormal weight loss: Secondary | ICD-10-CM | POA: Diagnosis not present

## 2019-01-23 DIAGNOSIS — I129 Hypertensive chronic kidney disease with stage 1 through stage 4 chronic kidney disease, or unspecified chronic kidney disease: Secondary | ICD-10-CM | POA: Diagnosis not present

## 2019-01-23 DIAGNOSIS — G309 Alzheimer's disease, unspecified: Secondary | ICD-10-CM | POA: Diagnosis not present

## 2019-01-30 DIAGNOSIS — F0280 Dementia in other diseases classified elsewhere without behavioral disturbance: Secondary | ICD-10-CM | POA: Diagnosis not present

## 2019-01-30 DIAGNOSIS — N184 Chronic kidney disease, stage 4 (severe): Secondary | ICD-10-CM | POA: Diagnosis not present

## 2019-01-30 DIAGNOSIS — G309 Alzheimer's disease, unspecified: Secondary | ICD-10-CM | POA: Diagnosis not present

## 2019-01-30 DIAGNOSIS — I129 Hypertensive chronic kidney disease with stage 1 through stage 4 chronic kidney disease, or unspecified chronic kidney disease: Secondary | ICD-10-CM | POA: Diagnosis not present

## 2019-01-30 DIAGNOSIS — F319 Bipolar disorder, unspecified: Secondary | ICD-10-CM | POA: Diagnosis not present

## 2019-01-30 DIAGNOSIS — R634 Abnormal weight loss: Secondary | ICD-10-CM | POA: Diagnosis not present

## 2019-02-06 DIAGNOSIS — I129 Hypertensive chronic kidney disease with stage 1 through stage 4 chronic kidney disease, or unspecified chronic kidney disease: Secondary | ICD-10-CM | POA: Diagnosis not present

## 2019-02-06 DIAGNOSIS — N184 Chronic kidney disease, stage 4 (severe): Secondary | ICD-10-CM | POA: Diagnosis not present

## 2019-02-06 DIAGNOSIS — R634 Abnormal weight loss: Secondary | ICD-10-CM | POA: Diagnosis not present

## 2019-02-06 DIAGNOSIS — F319 Bipolar disorder, unspecified: Secondary | ICD-10-CM | POA: Diagnosis not present

## 2019-02-06 DIAGNOSIS — G309 Alzheimer's disease, unspecified: Secondary | ICD-10-CM | POA: Diagnosis not present

## 2019-02-06 DIAGNOSIS — F0280 Dementia in other diseases classified elsewhere without behavioral disturbance: Secondary | ICD-10-CM | POA: Diagnosis not present

## 2019-02-07 DIAGNOSIS — R634 Abnormal weight loss: Secondary | ICD-10-CM | POA: Diagnosis not present

## 2019-02-07 DIAGNOSIS — I129 Hypertensive chronic kidney disease with stage 1 through stage 4 chronic kidney disease, or unspecified chronic kidney disease: Secondary | ICD-10-CM | POA: Diagnosis not present

## 2019-02-07 DIAGNOSIS — N184 Chronic kidney disease, stage 4 (severe): Secondary | ICD-10-CM | POA: Diagnosis not present

## 2019-02-07 DIAGNOSIS — F0280 Dementia in other diseases classified elsewhere without behavioral disturbance: Secondary | ICD-10-CM | POA: Diagnosis not present

## 2019-02-07 DIAGNOSIS — G309 Alzheimer's disease, unspecified: Secondary | ICD-10-CM | POA: Diagnosis not present

## 2019-02-07 DIAGNOSIS — K219 Gastro-esophageal reflux disease without esophagitis: Secondary | ICD-10-CM | POA: Diagnosis not present

## 2019-02-07 DIAGNOSIS — F319 Bipolar disorder, unspecified: Secondary | ICD-10-CM | POA: Diagnosis not present

## 2019-02-07 DIAGNOSIS — Z853 Personal history of malignant neoplasm of breast: Secondary | ICD-10-CM | POA: Diagnosis not present

## 2019-02-07 DIAGNOSIS — K3184 Gastroparesis: Secondary | ICD-10-CM | POA: Diagnosis not present

## 2019-02-07 DIAGNOSIS — G2401 Drug induced subacute dyskinesia: Secondary | ICD-10-CM | POA: Diagnosis not present

## 2019-02-13 DIAGNOSIS — R634 Abnormal weight loss: Secondary | ICD-10-CM | POA: Diagnosis not present

## 2019-02-13 DIAGNOSIS — I129 Hypertensive chronic kidney disease with stage 1 through stage 4 chronic kidney disease, or unspecified chronic kidney disease: Secondary | ICD-10-CM | POA: Diagnosis not present

## 2019-02-13 DIAGNOSIS — N184 Chronic kidney disease, stage 4 (severe): Secondary | ICD-10-CM | POA: Diagnosis not present

## 2019-02-13 DIAGNOSIS — G309 Alzheimer's disease, unspecified: Secondary | ICD-10-CM | POA: Diagnosis not present

## 2019-02-13 DIAGNOSIS — F319 Bipolar disorder, unspecified: Secondary | ICD-10-CM | POA: Diagnosis not present

## 2019-02-13 DIAGNOSIS — F0280 Dementia in other diseases classified elsewhere without behavioral disturbance: Secondary | ICD-10-CM | POA: Diagnosis not present

## 2019-02-18 DIAGNOSIS — G309 Alzheimer's disease, unspecified: Secondary | ICD-10-CM | POA: Diagnosis not present

## 2019-02-18 DIAGNOSIS — F319 Bipolar disorder, unspecified: Secondary | ICD-10-CM | POA: Diagnosis not present

## 2019-02-18 DIAGNOSIS — N184 Chronic kidney disease, stage 4 (severe): Secondary | ICD-10-CM | POA: Diagnosis not present

## 2019-02-18 DIAGNOSIS — R634 Abnormal weight loss: Secondary | ICD-10-CM | POA: Diagnosis not present

## 2019-02-18 DIAGNOSIS — F0280 Dementia in other diseases classified elsewhere without behavioral disturbance: Secondary | ICD-10-CM | POA: Diagnosis not present

## 2019-02-18 DIAGNOSIS — I129 Hypertensive chronic kidney disease with stage 1 through stage 4 chronic kidney disease, or unspecified chronic kidney disease: Secondary | ICD-10-CM | POA: Diagnosis not present

## 2019-02-20 DIAGNOSIS — F319 Bipolar disorder, unspecified: Secondary | ICD-10-CM | POA: Diagnosis not present

## 2019-02-20 DIAGNOSIS — G309 Alzheimer's disease, unspecified: Secondary | ICD-10-CM | POA: Diagnosis not present

## 2019-02-20 DIAGNOSIS — R634 Abnormal weight loss: Secondary | ICD-10-CM | POA: Diagnosis not present

## 2019-02-20 DIAGNOSIS — I129 Hypertensive chronic kidney disease with stage 1 through stage 4 chronic kidney disease, or unspecified chronic kidney disease: Secondary | ICD-10-CM | POA: Diagnosis not present

## 2019-02-20 DIAGNOSIS — N184 Chronic kidney disease, stage 4 (severe): Secondary | ICD-10-CM | POA: Diagnosis not present

## 2019-02-20 DIAGNOSIS — F0280 Dementia in other diseases classified elsewhere without behavioral disturbance: Secondary | ICD-10-CM | POA: Diagnosis not present

## 2019-02-27 DIAGNOSIS — N184 Chronic kidney disease, stage 4 (severe): Secondary | ICD-10-CM | POA: Diagnosis not present

## 2019-02-27 DIAGNOSIS — F319 Bipolar disorder, unspecified: Secondary | ICD-10-CM | POA: Diagnosis not present

## 2019-02-27 DIAGNOSIS — G309 Alzheimer's disease, unspecified: Secondary | ICD-10-CM | POA: Diagnosis not present

## 2019-02-27 DIAGNOSIS — I129 Hypertensive chronic kidney disease with stage 1 through stage 4 chronic kidney disease, or unspecified chronic kidney disease: Secondary | ICD-10-CM | POA: Diagnosis not present

## 2019-02-27 DIAGNOSIS — F0280 Dementia in other diseases classified elsewhere without behavioral disturbance: Secondary | ICD-10-CM | POA: Diagnosis not present

## 2019-02-27 DIAGNOSIS — R634 Abnormal weight loss: Secondary | ICD-10-CM | POA: Diagnosis not present

## 2019-03-06 DIAGNOSIS — F319 Bipolar disorder, unspecified: Secondary | ICD-10-CM | POA: Diagnosis not present

## 2019-03-06 DIAGNOSIS — R634 Abnormal weight loss: Secondary | ICD-10-CM | POA: Diagnosis not present

## 2019-03-06 DIAGNOSIS — I129 Hypertensive chronic kidney disease with stage 1 through stage 4 chronic kidney disease, or unspecified chronic kidney disease: Secondary | ICD-10-CM | POA: Diagnosis not present

## 2019-03-06 DIAGNOSIS — F0280 Dementia in other diseases classified elsewhere without behavioral disturbance: Secondary | ICD-10-CM | POA: Diagnosis not present

## 2019-03-06 DIAGNOSIS — N184 Chronic kidney disease, stage 4 (severe): Secondary | ICD-10-CM | POA: Diagnosis not present

## 2019-03-06 DIAGNOSIS — G309 Alzheimer's disease, unspecified: Secondary | ICD-10-CM | POA: Diagnosis not present

## 2019-03-10 DIAGNOSIS — F0280 Dementia in other diseases classified elsewhere without behavioral disturbance: Secondary | ICD-10-CM | POA: Diagnosis not present

## 2019-03-10 DIAGNOSIS — F319 Bipolar disorder, unspecified: Secondary | ICD-10-CM | POA: Diagnosis not present

## 2019-03-10 DIAGNOSIS — K219 Gastro-esophageal reflux disease without esophagitis: Secondary | ICD-10-CM | POA: Diagnosis not present

## 2019-03-10 DIAGNOSIS — G2401 Drug induced subacute dyskinesia: Secondary | ICD-10-CM | POA: Diagnosis not present

## 2019-03-10 DIAGNOSIS — I129 Hypertensive chronic kidney disease with stage 1 through stage 4 chronic kidney disease, or unspecified chronic kidney disease: Secondary | ICD-10-CM | POA: Diagnosis not present

## 2019-03-10 DIAGNOSIS — R634 Abnormal weight loss: Secondary | ICD-10-CM | POA: Diagnosis not present

## 2019-03-10 DIAGNOSIS — K3184 Gastroparesis: Secondary | ICD-10-CM | POA: Diagnosis not present

## 2019-03-10 DIAGNOSIS — Z853 Personal history of malignant neoplasm of breast: Secondary | ICD-10-CM | POA: Diagnosis not present

## 2019-03-10 DIAGNOSIS — N184 Chronic kidney disease, stage 4 (severe): Secondary | ICD-10-CM | POA: Diagnosis not present

## 2019-03-10 DIAGNOSIS — G309 Alzheimer's disease, unspecified: Secondary | ICD-10-CM | POA: Diagnosis not present

## 2019-03-11 DIAGNOSIS — N184 Chronic kidney disease, stage 4 (severe): Secondary | ICD-10-CM | POA: Diagnosis not present

## 2019-03-11 DIAGNOSIS — F319 Bipolar disorder, unspecified: Secondary | ICD-10-CM | POA: Diagnosis not present

## 2019-03-11 DIAGNOSIS — F0280 Dementia in other diseases classified elsewhere without behavioral disturbance: Secondary | ICD-10-CM | POA: Diagnosis not present

## 2019-03-11 DIAGNOSIS — I129 Hypertensive chronic kidney disease with stage 1 through stage 4 chronic kidney disease, or unspecified chronic kidney disease: Secondary | ICD-10-CM | POA: Diagnosis not present

## 2019-03-11 DIAGNOSIS — G309 Alzheimer's disease, unspecified: Secondary | ICD-10-CM | POA: Diagnosis not present

## 2019-03-11 DIAGNOSIS — R634 Abnormal weight loss: Secondary | ICD-10-CM | POA: Diagnosis not present

## 2019-03-13 DIAGNOSIS — F319 Bipolar disorder, unspecified: Secondary | ICD-10-CM | POA: Diagnosis not present

## 2019-03-13 DIAGNOSIS — R634 Abnormal weight loss: Secondary | ICD-10-CM | POA: Diagnosis not present

## 2019-03-13 DIAGNOSIS — G309 Alzheimer's disease, unspecified: Secondary | ICD-10-CM | POA: Diagnosis not present

## 2019-03-13 DIAGNOSIS — I129 Hypertensive chronic kidney disease with stage 1 through stage 4 chronic kidney disease, or unspecified chronic kidney disease: Secondary | ICD-10-CM | POA: Diagnosis not present

## 2019-03-13 DIAGNOSIS — N184 Chronic kidney disease, stage 4 (severe): Secondary | ICD-10-CM | POA: Diagnosis not present

## 2019-03-13 DIAGNOSIS — F0280 Dementia in other diseases classified elsewhere without behavioral disturbance: Secondary | ICD-10-CM | POA: Diagnosis not present

## 2019-03-20 DIAGNOSIS — G309 Alzheimer's disease, unspecified: Secondary | ICD-10-CM | POA: Diagnosis not present

## 2019-03-20 DIAGNOSIS — B372 Candidiasis of skin and nail: Secondary | ICD-10-CM | POA: Diagnosis not present

## 2019-03-20 DIAGNOSIS — N184 Chronic kidney disease, stage 4 (severe): Secondary | ICD-10-CM | POA: Diagnosis not present

## 2019-03-20 DIAGNOSIS — F0390 Unspecified dementia without behavioral disturbance: Secondary | ICD-10-CM | POA: Diagnosis not present

## 2019-03-20 DIAGNOSIS — I10 Essential (primary) hypertension: Secondary | ICD-10-CM | POA: Diagnosis not present

## 2019-03-20 DIAGNOSIS — F319 Bipolar disorder, unspecified: Secondary | ICD-10-CM | POA: Diagnosis not present

## 2019-03-20 DIAGNOSIS — F0280 Dementia in other diseases classified elsewhere without behavioral disturbance: Secondary | ICD-10-CM | POA: Diagnosis not present

## 2019-03-20 DIAGNOSIS — R627 Adult failure to thrive: Secondary | ICD-10-CM | POA: Diagnosis not present

## 2019-03-20 DIAGNOSIS — R634 Abnormal weight loss: Secondary | ICD-10-CM | POA: Diagnosis not present

## 2019-03-20 DIAGNOSIS — I129 Hypertensive chronic kidney disease with stage 1 through stage 4 chronic kidney disease, or unspecified chronic kidney disease: Secondary | ICD-10-CM | POA: Diagnosis not present

## 2019-04-01 DIAGNOSIS — F319 Bipolar disorder, unspecified: Secondary | ICD-10-CM | POA: Diagnosis not present

## 2019-04-01 DIAGNOSIS — F0280 Dementia in other diseases classified elsewhere without behavioral disturbance: Secondary | ICD-10-CM | POA: Diagnosis not present

## 2019-04-01 DIAGNOSIS — G309 Alzheimer's disease, unspecified: Secondary | ICD-10-CM | POA: Diagnosis not present

## 2019-04-01 DIAGNOSIS — R634 Abnormal weight loss: Secondary | ICD-10-CM | POA: Diagnosis not present

## 2019-04-01 DIAGNOSIS — I129 Hypertensive chronic kidney disease with stage 1 through stage 4 chronic kidney disease, or unspecified chronic kidney disease: Secondary | ICD-10-CM | POA: Diagnosis not present

## 2019-04-01 DIAGNOSIS — N184 Chronic kidney disease, stage 4 (severe): Secondary | ICD-10-CM | POA: Diagnosis not present

## 2019-04-03 DIAGNOSIS — I129 Hypertensive chronic kidney disease with stage 1 through stage 4 chronic kidney disease, or unspecified chronic kidney disease: Secondary | ICD-10-CM | POA: Diagnosis not present

## 2019-04-03 DIAGNOSIS — G309 Alzheimer's disease, unspecified: Secondary | ICD-10-CM | POA: Diagnosis not present

## 2019-04-03 DIAGNOSIS — F0280 Dementia in other diseases classified elsewhere without behavioral disturbance: Secondary | ICD-10-CM | POA: Diagnosis not present

## 2019-04-03 DIAGNOSIS — R634 Abnormal weight loss: Secondary | ICD-10-CM | POA: Diagnosis not present

## 2019-04-03 DIAGNOSIS — N184 Chronic kidney disease, stage 4 (severe): Secondary | ICD-10-CM | POA: Diagnosis not present

## 2019-04-03 DIAGNOSIS — F319 Bipolar disorder, unspecified: Secondary | ICD-10-CM | POA: Diagnosis not present

## 2019-05-10 DIAGNOSIS — K3184 Gastroparesis: Secondary | ICD-10-CM | POA: Diagnosis not present

## 2019-05-10 DIAGNOSIS — G2401 Drug induced subacute dyskinesia: Secondary | ICD-10-CM | POA: Diagnosis not present

## 2019-05-10 DIAGNOSIS — K219 Gastro-esophageal reflux disease without esophagitis: Secondary | ICD-10-CM | POA: Diagnosis not present

## 2019-05-10 DIAGNOSIS — F0280 Dementia in other diseases classified elsewhere without behavioral disturbance: Secondary | ICD-10-CM | POA: Diagnosis not present

## 2019-05-10 DIAGNOSIS — R634 Abnormal weight loss: Secondary | ICD-10-CM | POA: Diagnosis not present

## 2019-05-10 DIAGNOSIS — R64 Cachexia: Secondary | ICD-10-CM | POA: Diagnosis not present

## 2019-05-10 DIAGNOSIS — G309 Alzheimer's disease, unspecified: Secondary | ICD-10-CM | POA: Diagnosis not present

## 2019-05-10 DIAGNOSIS — Z853 Personal history of malignant neoplasm of breast: Secondary | ICD-10-CM | POA: Diagnosis not present

## 2019-05-10 DIAGNOSIS — N184 Chronic kidney disease, stage 4 (severe): Secondary | ICD-10-CM | POA: Diagnosis not present

## 2019-05-10 DIAGNOSIS — I129 Hypertensive chronic kidney disease with stage 1 through stage 4 chronic kidney disease, or unspecified chronic kidney disease: Secondary | ICD-10-CM | POA: Diagnosis not present

## 2019-05-10 DIAGNOSIS — F319 Bipolar disorder, unspecified: Secondary | ICD-10-CM | POA: Diagnosis not present

## 2019-05-15 DIAGNOSIS — F319 Bipolar disorder, unspecified: Secondary | ICD-10-CM | POA: Diagnosis not present

## 2019-05-15 DIAGNOSIS — N184 Chronic kidney disease, stage 4 (severe): Secondary | ICD-10-CM | POA: Diagnosis not present

## 2019-05-15 DIAGNOSIS — G309 Alzheimer's disease, unspecified: Secondary | ICD-10-CM | POA: Diagnosis not present

## 2019-05-15 DIAGNOSIS — F0280 Dementia in other diseases classified elsewhere without behavioral disturbance: Secondary | ICD-10-CM | POA: Diagnosis not present

## 2019-05-15 DIAGNOSIS — R634 Abnormal weight loss: Secondary | ICD-10-CM | POA: Diagnosis not present

## 2019-05-15 DIAGNOSIS — I129 Hypertensive chronic kidney disease with stage 1 through stage 4 chronic kidney disease, or unspecified chronic kidney disease: Secondary | ICD-10-CM | POA: Diagnosis not present

## 2019-05-20 DIAGNOSIS — G309 Alzheimer's disease, unspecified: Secondary | ICD-10-CM | POA: Diagnosis not present

## 2019-05-20 DIAGNOSIS — F0280 Dementia in other diseases classified elsewhere without behavioral disturbance: Secondary | ICD-10-CM | POA: Diagnosis not present

## 2019-05-20 DIAGNOSIS — F319 Bipolar disorder, unspecified: Secondary | ICD-10-CM | POA: Diagnosis not present

## 2019-05-20 DIAGNOSIS — I129 Hypertensive chronic kidney disease with stage 1 through stage 4 chronic kidney disease, or unspecified chronic kidney disease: Secondary | ICD-10-CM | POA: Diagnosis not present

## 2019-05-20 DIAGNOSIS — N184 Chronic kidney disease, stage 4 (severe): Secondary | ICD-10-CM | POA: Diagnosis not present

## 2019-05-20 DIAGNOSIS — R634 Abnormal weight loss: Secondary | ICD-10-CM | POA: Diagnosis not present

## 2019-05-29 DIAGNOSIS — I129 Hypertensive chronic kidney disease with stage 1 through stage 4 chronic kidney disease, or unspecified chronic kidney disease: Secondary | ICD-10-CM | POA: Diagnosis not present

## 2019-05-29 DIAGNOSIS — F319 Bipolar disorder, unspecified: Secondary | ICD-10-CM | POA: Diagnosis not present

## 2019-05-29 DIAGNOSIS — R634 Abnormal weight loss: Secondary | ICD-10-CM | POA: Diagnosis not present

## 2019-05-29 DIAGNOSIS — F0280 Dementia in other diseases classified elsewhere without behavioral disturbance: Secondary | ICD-10-CM | POA: Diagnosis not present

## 2019-05-29 DIAGNOSIS — N184 Chronic kidney disease, stage 4 (severe): Secondary | ICD-10-CM | POA: Diagnosis not present

## 2019-05-29 DIAGNOSIS — G309 Alzheimer's disease, unspecified: Secondary | ICD-10-CM | POA: Diagnosis not present

## 2019-05-30 DIAGNOSIS — E43 Unspecified severe protein-calorie malnutrition: Secondary | ICD-10-CM | POA: Diagnosis not present

## 2019-05-30 DIAGNOSIS — F0390 Unspecified dementia without behavioral disturbance: Secondary | ICD-10-CM | POA: Diagnosis not present

## 2019-05-30 DIAGNOSIS — R627 Adult failure to thrive: Secondary | ICD-10-CM | POA: Diagnosis not present

## 2019-05-30 DIAGNOSIS — I10 Essential (primary) hypertension: Secondary | ICD-10-CM | POA: Diagnosis not present

## 2019-05-30 DIAGNOSIS — K5900 Constipation, unspecified: Secondary | ICD-10-CM | POA: Diagnosis not present

## 2019-06-05 DIAGNOSIS — R634 Abnormal weight loss: Secondary | ICD-10-CM | POA: Diagnosis not present

## 2019-06-05 DIAGNOSIS — N184 Chronic kidney disease, stage 4 (severe): Secondary | ICD-10-CM | POA: Diagnosis not present

## 2019-06-05 DIAGNOSIS — I129 Hypertensive chronic kidney disease with stage 1 through stage 4 chronic kidney disease, or unspecified chronic kidney disease: Secondary | ICD-10-CM | POA: Diagnosis not present

## 2019-06-05 DIAGNOSIS — F319 Bipolar disorder, unspecified: Secondary | ICD-10-CM | POA: Diagnosis not present

## 2019-06-05 DIAGNOSIS — F0280 Dementia in other diseases classified elsewhere without behavioral disturbance: Secondary | ICD-10-CM | POA: Diagnosis not present

## 2019-06-05 DIAGNOSIS — G309 Alzheimer's disease, unspecified: Secondary | ICD-10-CM | POA: Diagnosis not present

## 2019-06-10 DIAGNOSIS — F0280 Dementia in other diseases classified elsewhere without behavioral disturbance: Secondary | ICD-10-CM | POA: Diagnosis not present

## 2019-06-10 DIAGNOSIS — Z853 Personal history of malignant neoplasm of breast: Secondary | ICD-10-CM | POA: Diagnosis not present

## 2019-06-10 DIAGNOSIS — G309 Alzheimer's disease, unspecified: Secondary | ICD-10-CM | POA: Diagnosis not present

## 2019-06-10 DIAGNOSIS — K3184 Gastroparesis: Secondary | ICD-10-CM | POA: Diagnosis not present

## 2019-06-10 DIAGNOSIS — I129 Hypertensive chronic kidney disease with stage 1 through stage 4 chronic kidney disease, or unspecified chronic kidney disease: Secondary | ICD-10-CM | POA: Diagnosis not present

## 2019-06-10 DIAGNOSIS — N184 Chronic kidney disease, stage 4 (severe): Secondary | ICD-10-CM | POA: Diagnosis not present

## 2019-06-10 DIAGNOSIS — K219 Gastro-esophageal reflux disease without esophagitis: Secondary | ICD-10-CM | POA: Diagnosis not present

## 2019-06-10 DIAGNOSIS — R64 Cachexia: Secondary | ICD-10-CM | POA: Diagnosis not present

## 2019-06-10 DIAGNOSIS — G2401 Drug induced subacute dyskinesia: Secondary | ICD-10-CM | POA: Diagnosis not present

## 2019-06-10 DIAGNOSIS — F319 Bipolar disorder, unspecified: Secondary | ICD-10-CM | POA: Diagnosis not present

## 2019-06-10 DIAGNOSIS — R634 Abnormal weight loss: Secondary | ICD-10-CM | POA: Diagnosis not present

## 2019-06-12 DIAGNOSIS — G309 Alzheimer's disease, unspecified: Secondary | ICD-10-CM | POA: Diagnosis not present

## 2019-06-12 DIAGNOSIS — I129 Hypertensive chronic kidney disease with stage 1 through stage 4 chronic kidney disease, or unspecified chronic kidney disease: Secondary | ICD-10-CM | POA: Diagnosis not present

## 2019-06-12 DIAGNOSIS — F0280 Dementia in other diseases classified elsewhere without behavioral disturbance: Secondary | ICD-10-CM | POA: Diagnosis not present

## 2019-06-12 DIAGNOSIS — F319 Bipolar disorder, unspecified: Secondary | ICD-10-CM | POA: Diagnosis not present

## 2019-06-12 DIAGNOSIS — N184 Chronic kidney disease, stage 4 (severe): Secondary | ICD-10-CM | POA: Diagnosis not present

## 2019-06-12 DIAGNOSIS — R634 Abnormal weight loss: Secondary | ICD-10-CM | POA: Diagnosis not present

## 2019-06-17 DIAGNOSIS — G309 Alzheimer's disease, unspecified: Secondary | ICD-10-CM | POA: Diagnosis not present

## 2019-06-17 DIAGNOSIS — R634 Abnormal weight loss: Secondary | ICD-10-CM | POA: Diagnosis not present

## 2019-06-17 DIAGNOSIS — F319 Bipolar disorder, unspecified: Secondary | ICD-10-CM | POA: Diagnosis not present

## 2019-06-17 DIAGNOSIS — I129 Hypertensive chronic kidney disease with stage 1 through stage 4 chronic kidney disease, or unspecified chronic kidney disease: Secondary | ICD-10-CM | POA: Diagnosis not present

## 2019-06-17 DIAGNOSIS — F0280 Dementia in other diseases classified elsewhere without behavioral disturbance: Secondary | ICD-10-CM | POA: Diagnosis not present

## 2019-06-17 DIAGNOSIS — N184 Chronic kidney disease, stage 4 (severe): Secondary | ICD-10-CM | POA: Diagnosis not present

## 2019-06-19 DIAGNOSIS — R634 Abnormal weight loss: Secondary | ICD-10-CM | POA: Diagnosis not present

## 2019-06-19 DIAGNOSIS — I129 Hypertensive chronic kidney disease with stage 1 through stage 4 chronic kidney disease, or unspecified chronic kidney disease: Secondary | ICD-10-CM | POA: Diagnosis not present

## 2019-06-19 DIAGNOSIS — F0280 Dementia in other diseases classified elsewhere without behavioral disturbance: Secondary | ICD-10-CM | POA: Diagnosis not present

## 2019-06-19 DIAGNOSIS — F319 Bipolar disorder, unspecified: Secondary | ICD-10-CM | POA: Diagnosis not present

## 2019-06-19 DIAGNOSIS — N184 Chronic kidney disease, stage 4 (severe): Secondary | ICD-10-CM | POA: Diagnosis not present

## 2019-06-19 DIAGNOSIS — G309 Alzheimer's disease, unspecified: Secondary | ICD-10-CM | POA: Diagnosis not present

## 2019-07-03 DIAGNOSIS — I129 Hypertensive chronic kidney disease with stage 1 through stage 4 chronic kidney disease, or unspecified chronic kidney disease: Secondary | ICD-10-CM | POA: Diagnosis not present

## 2019-07-03 DIAGNOSIS — R634 Abnormal weight loss: Secondary | ICD-10-CM | POA: Diagnosis not present

## 2019-07-03 DIAGNOSIS — F319 Bipolar disorder, unspecified: Secondary | ICD-10-CM | POA: Diagnosis not present

## 2019-07-03 DIAGNOSIS — N184 Chronic kidney disease, stage 4 (severe): Secondary | ICD-10-CM | POA: Diagnosis not present

## 2019-07-03 DIAGNOSIS — G309 Alzheimer's disease, unspecified: Secondary | ICD-10-CM | POA: Diagnosis not present

## 2019-07-03 DIAGNOSIS — F0280 Dementia in other diseases classified elsewhere without behavioral disturbance: Secondary | ICD-10-CM | POA: Diagnosis not present

## 2019-07-08 DIAGNOSIS — N184 Chronic kidney disease, stage 4 (severe): Secondary | ICD-10-CM | POA: Diagnosis not present

## 2019-07-08 DIAGNOSIS — R634 Abnormal weight loss: Secondary | ICD-10-CM | POA: Diagnosis not present

## 2019-07-08 DIAGNOSIS — G2401 Drug induced subacute dyskinesia: Secondary | ICD-10-CM | POA: Diagnosis not present

## 2019-07-08 DIAGNOSIS — R64 Cachexia: Secondary | ICD-10-CM | POA: Diagnosis not present

## 2019-07-08 DIAGNOSIS — F0280 Dementia in other diseases classified elsewhere without behavioral disturbance: Secondary | ICD-10-CM | POA: Diagnosis not present

## 2019-07-08 DIAGNOSIS — K3184 Gastroparesis: Secondary | ICD-10-CM | POA: Diagnosis not present

## 2019-07-08 DIAGNOSIS — G309 Alzheimer's disease, unspecified: Secondary | ICD-10-CM | POA: Diagnosis not present

## 2019-07-08 DIAGNOSIS — F319 Bipolar disorder, unspecified: Secondary | ICD-10-CM | POA: Diagnosis not present

## 2019-07-08 DIAGNOSIS — I129 Hypertensive chronic kidney disease with stage 1 through stage 4 chronic kidney disease, or unspecified chronic kidney disease: Secondary | ICD-10-CM | POA: Diagnosis not present

## 2019-07-08 DIAGNOSIS — K219 Gastro-esophageal reflux disease without esophagitis: Secondary | ICD-10-CM | POA: Diagnosis not present

## 2019-07-08 DIAGNOSIS — Z853 Personal history of malignant neoplasm of breast: Secondary | ICD-10-CM | POA: Diagnosis not present

## 2019-07-11 DIAGNOSIS — I129 Hypertensive chronic kidney disease with stage 1 through stage 4 chronic kidney disease, or unspecified chronic kidney disease: Secondary | ICD-10-CM | POA: Diagnosis not present

## 2019-07-11 DIAGNOSIS — R634 Abnormal weight loss: Secondary | ICD-10-CM | POA: Diagnosis not present

## 2019-07-11 DIAGNOSIS — G309 Alzheimer's disease, unspecified: Secondary | ICD-10-CM | POA: Diagnosis not present

## 2019-07-11 DIAGNOSIS — F319 Bipolar disorder, unspecified: Secondary | ICD-10-CM | POA: Diagnosis not present

## 2019-07-11 DIAGNOSIS — N184 Chronic kidney disease, stage 4 (severe): Secondary | ICD-10-CM | POA: Diagnosis not present

## 2019-07-11 DIAGNOSIS — F0280 Dementia in other diseases classified elsewhere without behavioral disturbance: Secondary | ICD-10-CM | POA: Diagnosis not present

## 2019-07-17 DIAGNOSIS — R634 Abnormal weight loss: Secondary | ICD-10-CM | POA: Diagnosis not present

## 2019-07-17 DIAGNOSIS — G309 Alzheimer's disease, unspecified: Secondary | ICD-10-CM | POA: Diagnosis not present

## 2019-07-17 DIAGNOSIS — I129 Hypertensive chronic kidney disease with stage 1 through stage 4 chronic kidney disease, or unspecified chronic kidney disease: Secondary | ICD-10-CM | POA: Diagnosis not present

## 2019-07-17 DIAGNOSIS — N184 Chronic kidney disease, stage 4 (severe): Secondary | ICD-10-CM | POA: Diagnosis not present

## 2019-07-17 DIAGNOSIS — F0280 Dementia in other diseases classified elsewhere without behavioral disturbance: Secondary | ICD-10-CM | POA: Diagnosis not present

## 2019-07-17 DIAGNOSIS — F319 Bipolar disorder, unspecified: Secondary | ICD-10-CM | POA: Diagnosis not present

## 2019-07-31 DIAGNOSIS — N184 Chronic kidney disease, stage 4 (severe): Secondary | ICD-10-CM | POA: Diagnosis not present

## 2019-07-31 DIAGNOSIS — F0280 Dementia in other diseases classified elsewhere without behavioral disturbance: Secondary | ICD-10-CM | POA: Diagnosis not present

## 2019-07-31 DIAGNOSIS — I129 Hypertensive chronic kidney disease with stage 1 through stage 4 chronic kidney disease, or unspecified chronic kidney disease: Secondary | ICD-10-CM | POA: Diagnosis not present

## 2019-07-31 DIAGNOSIS — R634 Abnormal weight loss: Secondary | ICD-10-CM | POA: Diagnosis not present

## 2019-07-31 DIAGNOSIS — F319 Bipolar disorder, unspecified: Secondary | ICD-10-CM | POA: Diagnosis not present

## 2019-07-31 DIAGNOSIS — G309 Alzheimer's disease, unspecified: Secondary | ICD-10-CM | POA: Diagnosis not present

## 2019-08-07 DIAGNOSIS — G309 Alzheimer's disease, unspecified: Secondary | ICD-10-CM | POA: Diagnosis not present

## 2019-08-07 DIAGNOSIS — F319 Bipolar disorder, unspecified: Secondary | ICD-10-CM | POA: Diagnosis not present

## 2019-08-07 DIAGNOSIS — R634 Abnormal weight loss: Secondary | ICD-10-CM | POA: Diagnosis not present

## 2019-08-07 DIAGNOSIS — F0280 Dementia in other diseases classified elsewhere without behavioral disturbance: Secondary | ICD-10-CM | POA: Diagnosis not present

## 2019-08-07 DIAGNOSIS — I129 Hypertensive chronic kidney disease with stage 1 through stage 4 chronic kidney disease, or unspecified chronic kidney disease: Secondary | ICD-10-CM | POA: Diagnosis not present

## 2019-08-07 DIAGNOSIS — N184 Chronic kidney disease, stage 4 (severe): Secondary | ICD-10-CM | POA: Diagnosis not present

## 2019-08-08 DIAGNOSIS — F0280 Dementia in other diseases classified elsewhere without behavioral disturbance: Secondary | ICD-10-CM | POA: Diagnosis not present

## 2019-08-08 DIAGNOSIS — R634 Abnormal weight loss: Secondary | ICD-10-CM | POA: Diagnosis not present

## 2019-08-08 DIAGNOSIS — G2401 Drug induced subacute dyskinesia: Secondary | ICD-10-CM | POA: Diagnosis not present

## 2019-08-08 DIAGNOSIS — Z853 Personal history of malignant neoplasm of breast: Secondary | ICD-10-CM | POA: Diagnosis not present

## 2019-08-08 DIAGNOSIS — R64 Cachexia: Secondary | ICD-10-CM | POA: Diagnosis not present

## 2019-08-08 DIAGNOSIS — F319 Bipolar disorder, unspecified: Secondary | ICD-10-CM | POA: Diagnosis not present

## 2019-08-08 DIAGNOSIS — N184 Chronic kidney disease, stage 4 (severe): Secondary | ICD-10-CM | POA: Diagnosis not present

## 2019-08-08 DIAGNOSIS — Z741 Need for assistance with personal care: Secondary | ICD-10-CM | POA: Diagnosis not present

## 2019-08-08 DIAGNOSIS — K3184 Gastroparesis: Secondary | ICD-10-CM | POA: Diagnosis not present

## 2019-08-08 DIAGNOSIS — I129 Hypertensive chronic kidney disease with stage 1 through stage 4 chronic kidney disease, or unspecified chronic kidney disease: Secondary | ICD-10-CM | POA: Diagnosis not present

## 2019-08-08 DIAGNOSIS — G309 Alzheimer's disease, unspecified: Secondary | ICD-10-CM | POA: Diagnosis not present

## 2019-08-08 DIAGNOSIS — K219 Gastro-esophageal reflux disease without esophagitis: Secondary | ICD-10-CM | POA: Diagnosis not present

## 2019-08-14 DIAGNOSIS — F0280 Dementia in other diseases classified elsewhere without behavioral disturbance: Secondary | ICD-10-CM | POA: Diagnosis not present

## 2019-08-14 DIAGNOSIS — G309 Alzheimer's disease, unspecified: Secondary | ICD-10-CM | POA: Diagnosis not present

## 2019-08-14 DIAGNOSIS — N184 Chronic kidney disease, stage 4 (severe): Secondary | ICD-10-CM | POA: Diagnosis not present

## 2019-08-14 DIAGNOSIS — R634 Abnormal weight loss: Secondary | ICD-10-CM | POA: Diagnosis not present

## 2019-08-14 DIAGNOSIS — I129 Hypertensive chronic kidney disease with stage 1 through stage 4 chronic kidney disease, or unspecified chronic kidney disease: Secondary | ICD-10-CM | POA: Diagnosis not present

## 2019-08-14 DIAGNOSIS — F319 Bipolar disorder, unspecified: Secondary | ICD-10-CM | POA: Diagnosis not present

## 2019-08-21 DIAGNOSIS — N184 Chronic kidney disease, stage 4 (severe): Secondary | ICD-10-CM | POA: Diagnosis not present

## 2019-08-21 DIAGNOSIS — R634 Abnormal weight loss: Secondary | ICD-10-CM | POA: Diagnosis not present

## 2019-08-21 DIAGNOSIS — F319 Bipolar disorder, unspecified: Secondary | ICD-10-CM | POA: Diagnosis not present

## 2019-08-21 DIAGNOSIS — G309 Alzheimer's disease, unspecified: Secondary | ICD-10-CM | POA: Diagnosis not present

## 2019-08-21 DIAGNOSIS — I129 Hypertensive chronic kidney disease with stage 1 through stage 4 chronic kidney disease, or unspecified chronic kidney disease: Secondary | ICD-10-CM | POA: Diagnosis not present

## 2019-08-21 DIAGNOSIS — F0280 Dementia in other diseases classified elsewhere without behavioral disturbance: Secondary | ICD-10-CM | POA: Diagnosis not present

## 2019-08-26 DIAGNOSIS — I129 Hypertensive chronic kidney disease with stage 1 through stage 4 chronic kidney disease, or unspecified chronic kidney disease: Secondary | ICD-10-CM | POA: Diagnosis not present

## 2019-08-26 DIAGNOSIS — R634 Abnormal weight loss: Secondary | ICD-10-CM | POA: Diagnosis not present

## 2019-08-26 DIAGNOSIS — N184 Chronic kidney disease, stage 4 (severe): Secondary | ICD-10-CM | POA: Diagnosis not present

## 2019-08-26 DIAGNOSIS — F0280 Dementia in other diseases classified elsewhere without behavioral disturbance: Secondary | ICD-10-CM | POA: Diagnosis not present

## 2019-08-26 DIAGNOSIS — F319 Bipolar disorder, unspecified: Secondary | ICD-10-CM | POA: Diagnosis not present

## 2019-08-26 DIAGNOSIS — G309 Alzheimer's disease, unspecified: Secondary | ICD-10-CM | POA: Diagnosis not present

## 2019-09-04 DIAGNOSIS — F0390 Unspecified dementia without behavioral disturbance: Secondary | ICD-10-CM | POA: Diagnosis not present

## 2019-09-04 DIAGNOSIS — I10 Essential (primary) hypertension: Secondary | ICD-10-CM | POA: Diagnosis not present

## 2019-09-04 DIAGNOSIS — R627 Adult failure to thrive: Secondary | ICD-10-CM | POA: Diagnosis not present

## 2019-09-06 DIAGNOSIS — G309 Alzheimer's disease, unspecified: Secondary | ICD-10-CM | POA: Diagnosis not present

## 2019-09-06 DIAGNOSIS — F319 Bipolar disorder, unspecified: Secondary | ICD-10-CM | POA: Diagnosis not present

## 2019-09-06 DIAGNOSIS — R634 Abnormal weight loss: Secondary | ICD-10-CM | POA: Diagnosis not present

## 2019-09-06 DIAGNOSIS — N184 Chronic kidney disease, stage 4 (severe): Secondary | ICD-10-CM | POA: Diagnosis not present

## 2019-09-06 DIAGNOSIS — F0280 Dementia in other diseases classified elsewhere without behavioral disturbance: Secondary | ICD-10-CM | POA: Diagnosis not present

## 2019-09-06 DIAGNOSIS — I129 Hypertensive chronic kidney disease with stage 1 through stage 4 chronic kidney disease, or unspecified chronic kidney disease: Secondary | ICD-10-CM | POA: Diagnosis not present

## 2019-09-07 DIAGNOSIS — Z853 Personal history of malignant neoplasm of breast: Secondary | ICD-10-CM | POA: Diagnosis not present

## 2019-09-07 DIAGNOSIS — I129 Hypertensive chronic kidney disease with stage 1 through stage 4 chronic kidney disease, or unspecified chronic kidney disease: Secondary | ICD-10-CM | POA: Diagnosis not present

## 2019-09-07 DIAGNOSIS — F319 Bipolar disorder, unspecified: Secondary | ICD-10-CM | POA: Diagnosis not present

## 2019-09-07 DIAGNOSIS — Z741 Need for assistance with personal care: Secondary | ICD-10-CM | POA: Diagnosis not present

## 2019-09-07 DIAGNOSIS — K3184 Gastroparesis: Secondary | ICD-10-CM | POA: Diagnosis not present

## 2019-09-07 DIAGNOSIS — F0280 Dementia in other diseases classified elsewhere without behavioral disturbance: Secondary | ICD-10-CM | POA: Diagnosis not present

## 2019-09-07 DIAGNOSIS — R634 Abnormal weight loss: Secondary | ICD-10-CM | POA: Diagnosis not present

## 2019-09-07 DIAGNOSIS — K219 Gastro-esophageal reflux disease without esophagitis: Secondary | ICD-10-CM | POA: Diagnosis not present

## 2019-09-07 DIAGNOSIS — G2401 Drug induced subacute dyskinesia: Secondary | ICD-10-CM | POA: Diagnosis not present

## 2019-09-07 DIAGNOSIS — R64 Cachexia: Secondary | ICD-10-CM | POA: Diagnosis not present

## 2019-09-07 DIAGNOSIS — G309 Alzheimer's disease, unspecified: Secondary | ICD-10-CM | POA: Diagnosis not present

## 2019-09-07 DIAGNOSIS — N184 Chronic kidney disease, stage 4 (severe): Secondary | ICD-10-CM | POA: Diagnosis not present

## 2019-09-10 DIAGNOSIS — F0280 Dementia in other diseases classified elsewhere without behavioral disturbance: Secondary | ICD-10-CM | POA: Diagnosis not present

## 2019-09-10 DIAGNOSIS — I129 Hypertensive chronic kidney disease with stage 1 through stage 4 chronic kidney disease, or unspecified chronic kidney disease: Secondary | ICD-10-CM | POA: Diagnosis not present

## 2019-09-10 DIAGNOSIS — R634 Abnormal weight loss: Secondary | ICD-10-CM | POA: Diagnosis not present

## 2019-09-10 DIAGNOSIS — G309 Alzheimer's disease, unspecified: Secondary | ICD-10-CM | POA: Diagnosis not present

## 2019-09-10 DIAGNOSIS — N184 Chronic kidney disease, stage 4 (severe): Secondary | ICD-10-CM | POA: Diagnosis not present

## 2019-09-10 DIAGNOSIS — F319 Bipolar disorder, unspecified: Secondary | ICD-10-CM | POA: Diagnosis not present

## 2019-09-19 DIAGNOSIS — F319 Bipolar disorder, unspecified: Secondary | ICD-10-CM | POA: Diagnosis not present

## 2019-09-19 DIAGNOSIS — I129 Hypertensive chronic kidney disease with stage 1 through stage 4 chronic kidney disease, or unspecified chronic kidney disease: Secondary | ICD-10-CM | POA: Diagnosis not present

## 2019-09-19 DIAGNOSIS — R634 Abnormal weight loss: Secondary | ICD-10-CM | POA: Diagnosis not present

## 2019-09-19 DIAGNOSIS — N184 Chronic kidney disease, stage 4 (severe): Secondary | ICD-10-CM | POA: Diagnosis not present

## 2019-09-19 DIAGNOSIS — G309 Alzheimer's disease, unspecified: Secondary | ICD-10-CM | POA: Diagnosis not present

## 2019-09-19 DIAGNOSIS — F0280 Dementia in other diseases classified elsewhere without behavioral disturbance: Secondary | ICD-10-CM | POA: Diagnosis not present

## 2019-09-23 DIAGNOSIS — R634 Abnormal weight loss: Secondary | ICD-10-CM | POA: Diagnosis not present

## 2019-09-23 DIAGNOSIS — N184 Chronic kidney disease, stage 4 (severe): Secondary | ICD-10-CM | POA: Diagnosis not present

## 2019-09-23 DIAGNOSIS — I129 Hypertensive chronic kidney disease with stage 1 through stage 4 chronic kidney disease, or unspecified chronic kidney disease: Secondary | ICD-10-CM | POA: Diagnosis not present

## 2019-09-23 DIAGNOSIS — F319 Bipolar disorder, unspecified: Secondary | ICD-10-CM | POA: Diagnosis not present

## 2019-09-23 DIAGNOSIS — F0280 Dementia in other diseases classified elsewhere without behavioral disturbance: Secondary | ICD-10-CM | POA: Diagnosis not present

## 2019-09-23 DIAGNOSIS — G309 Alzheimer's disease, unspecified: Secondary | ICD-10-CM | POA: Diagnosis not present

## 2019-10-03 DIAGNOSIS — F0280 Dementia in other diseases classified elsewhere without behavioral disturbance: Secondary | ICD-10-CM | POA: Diagnosis not present

## 2019-10-03 DIAGNOSIS — G309 Alzheimer's disease, unspecified: Secondary | ICD-10-CM | POA: Diagnosis not present

## 2019-10-03 DIAGNOSIS — F319 Bipolar disorder, unspecified: Secondary | ICD-10-CM | POA: Diagnosis not present

## 2019-10-03 DIAGNOSIS — I129 Hypertensive chronic kidney disease with stage 1 through stage 4 chronic kidney disease, or unspecified chronic kidney disease: Secondary | ICD-10-CM | POA: Diagnosis not present

## 2019-10-03 DIAGNOSIS — N184 Chronic kidney disease, stage 4 (severe): Secondary | ICD-10-CM | POA: Diagnosis not present

## 2019-10-03 DIAGNOSIS — R634 Abnormal weight loss: Secondary | ICD-10-CM | POA: Diagnosis not present

## 2019-10-07 DIAGNOSIS — N184 Chronic kidney disease, stage 4 (severe): Secondary | ICD-10-CM | POA: Diagnosis not present

## 2019-10-07 DIAGNOSIS — G309 Alzheimer's disease, unspecified: Secondary | ICD-10-CM | POA: Diagnosis not present

## 2019-10-07 DIAGNOSIS — F0280 Dementia in other diseases classified elsewhere without behavioral disturbance: Secondary | ICD-10-CM | POA: Diagnosis not present

## 2019-10-07 DIAGNOSIS — I129 Hypertensive chronic kidney disease with stage 1 through stage 4 chronic kidney disease, or unspecified chronic kidney disease: Secondary | ICD-10-CM | POA: Diagnosis not present

## 2019-10-07 DIAGNOSIS — F319 Bipolar disorder, unspecified: Secondary | ICD-10-CM | POA: Diagnosis not present

## 2019-10-07 DIAGNOSIS — R634 Abnormal weight loss: Secondary | ICD-10-CM | POA: Diagnosis not present

## 2019-11-07 DIAGNOSIS — R64 Cachexia: Secondary | ICD-10-CM | POA: Diagnosis not present

## 2019-11-07 DIAGNOSIS — R32 Unspecified urinary incontinence: Secondary | ICD-10-CM | POA: Diagnosis not present

## 2019-11-07 DIAGNOSIS — Z741 Need for assistance with personal care: Secondary | ICD-10-CM | POA: Diagnosis not present

## 2019-11-07 DIAGNOSIS — G309 Alzheimer's disease, unspecified: Secondary | ICD-10-CM | POA: Diagnosis not present

## 2019-11-07 DIAGNOSIS — G2401 Drug induced subacute dyskinesia: Secondary | ICD-10-CM | POA: Diagnosis not present

## 2019-11-07 DIAGNOSIS — Z853 Personal history of malignant neoplasm of breast: Secondary | ICD-10-CM | POA: Diagnosis not present

## 2019-11-07 DIAGNOSIS — N184 Chronic kidney disease, stage 4 (severe): Secondary | ICD-10-CM | POA: Diagnosis not present

## 2019-11-07 DIAGNOSIS — F0280 Dementia in other diseases classified elsewhere without behavioral disturbance: Secondary | ICD-10-CM | POA: Diagnosis not present

## 2019-11-07 DIAGNOSIS — K3184 Gastroparesis: Secondary | ICD-10-CM | POA: Diagnosis not present

## 2019-11-07 DIAGNOSIS — R634 Abnormal weight loss: Secondary | ICD-10-CM | POA: Diagnosis not present

## 2019-11-07 DIAGNOSIS — R159 Full incontinence of feces: Secondary | ICD-10-CM | POA: Diagnosis not present

## 2019-11-07 DIAGNOSIS — K219 Gastro-esophageal reflux disease without esophagitis: Secondary | ICD-10-CM | POA: Diagnosis not present

## 2019-11-07 DIAGNOSIS — F319 Bipolar disorder, unspecified: Secondary | ICD-10-CM | POA: Diagnosis not present

## 2019-11-07 DIAGNOSIS — I129 Hypertensive chronic kidney disease with stage 1 through stage 4 chronic kidney disease, or unspecified chronic kidney disease: Secondary | ICD-10-CM | POA: Diagnosis not present

## 2019-11-12 DIAGNOSIS — N184 Chronic kidney disease, stage 4 (severe): Secondary | ICD-10-CM | POA: Diagnosis not present

## 2019-11-12 DIAGNOSIS — F319 Bipolar disorder, unspecified: Secondary | ICD-10-CM | POA: Diagnosis not present

## 2019-11-12 DIAGNOSIS — G309 Alzheimer's disease, unspecified: Secondary | ICD-10-CM | POA: Diagnosis not present

## 2019-11-12 DIAGNOSIS — R634 Abnormal weight loss: Secondary | ICD-10-CM | POA: Diagnosis not present

## 2019-11-12 DIAGNOSIS — F0280 Dementia in other diseases classified elsewhere without behavioral disturbance: Secondary | ICD-10-CM | POA: Diagnosis not present

## 2019-11-12 DIAGNOSIS — I129 Hypertensive chronic kidney disease with stage 1 through stage 4 chronic kidney disease, or unspecified chronic kidney disease: Secondary | ICD-10-CM | POA: Diagnosis not present

## 2019-11-27 DIAGNOSIS — I129 Hypertensive chronic kidney disease with stage 1 through stage 4 chronic kidney disease, or unspecified chronic kidney disease: Secondary | ICD-10-CM | POA: Diagnosis not present

## 2019-11-27 DIAGNOSIS — F319 Bipolar disorder, unspecified: Secondary | ICD-10-CM | POA: Diagnosis not present

## 2019-11-27 DIAGNOSIS — G309 Alzheimer's disease, unspecified: Secondary | ICD-10-CM | POA: Diagnosis not present

## 2019-11-27 DIAGNOSIS — N184 Chronic kidney disease, stage 4 (severe): Secondary | ICD-10-CM | POA: Diagnosis not present

## 2019-11-27 DIAGNOSIS — F0280 Dementia in other diseases classified elsewhere without behavioral disturbance: Secondary | ICD-10-CM | POA: Diagnosis not present

## 2019-11-27 DIAGNOSIS — R634 Abnormal weight loss: Secondary | ICD-10-CM | POA: Diagnosis not present

## 2019-12-04 DIAGNOSIS — N184 Chronic kidney disease, stage 4 (severe): Secondary | ICD-10-CM | POA: Diagnosis not present

## 2019-12-04 DIAGNOSIS — I129 Hypertensive chronic kidney disease with stage 1 through stage 4 chronic kidney disease, or unspecified chronic kidney disease: Secondary | ICD-10-CM | POA: Diagnosis not present

## 2019-12-04 DIAGNOSIS — R634 Abnormal weight loss: Secondary | ICD-10-CM | POA: Diagnosis not present

## 2019-12-04 DIAGNOSIS — F319 Bipolar disorder, unspecified: Secondary | ICD-10-CM | POA: Diagnosis not present

## 2019-12-04 DIAGNOSIS — F0280 Dementia in other diseases classified elsewhere without behavioral disturbance: Secondary | ICD-10-CM | POA: Diagnosis not present

## 2019-12-04 DIAGNOSIS — G309 Alzheimer's disease, unspecified: Secondary | ICD-10-CM | POA: Diagnosis not present

## 2019-12-05 DIAGNOSIS — F0280 Dementia in other diseases classified elsewhere without behavioral disturbance: Secondary | ICD-10-CM | POA: Diagnosis not present

## 2019-12-05 DIAGNOSIS — G309 Alzheimer's disease, unspecified: Secondary | ICD-10-CM | POA: Diagnosis not present

## 2019-12-05 DIAGNOSIS — I129 Hypertensive chronic kidney disease with stage 1 through stage 4 chronic kidney disease, or unspecified chronic kidney disease: Secondary | ICD-10-CM | POA: Diagnosis not present

## 2019-12-05 DIAGNOSIS — N184 Chronic kidney disease, stage 4 (severe): Secondary | ICD-10-CM | POA: Diagnosis not present

## 2019-12-05 DIAGNOSIS — F319 Bipolar disorder, unspecified: Secondary | ICD-10-CM | POA: Diagnosis not present

## 2019-12-05 DIAGNOSIS — R634 Abnormal weight loss: Secondary | ICD-10-CM | POA: Diagnosis not present

## 2019-12-08 DIAGNOSIS — G309 Alzheimer's disease, unspecified: Secondary | ICD-10-CM | POA: Diagnosis not present

## 2019-12-08 DIAGNOSIS — G2401 Drug induced subacute dyskinesia: Secondary | ICD-10-CM | POA: Diagnosis not present

## 2019-12-08 DIAGNOSIS — N184 Chronic kidney disease, stage 4 (severe): Secondary | ICD-10-CM | POA: Diagnosis not present

## 2019-12-08 DIAGNOSIS — Z853 Personal history of malignant neoplasm of breast: Secondary | ICD-10-CM | POA: Diagnosis not present

## 2019-12-08 DIAGNOSIS — R634 Abnormal weight loss: Secondary | ICD-10-CM | POA: Diagnosis not present

## 2019-12-08 DIAGNOSIS — Z741 Need for assistance with personal care: Secondary | ICD-10-CM | POA: Diagnosis not present

## 2019-12-08 DIAGNOSIS — F0280 Dementia in other diseases classified elsewhere without behavioral disturbance: Secondary | ICD-10-CM | POA: Diagnosis not present

## 2019-12-08 DIAGNOSIS — R159 Full incontinence of feces: Secondary | ICD-10-CM | POA: Diagnosis not present

## 2019-12-08 DIAGNOSIS — R64 Cachexia: Secondary | ICD-10-CM | POA: Diagnosis not present

## 2019-12-08 DIAGNOSIS — K219 Gastro-esophageal reflux disease without esophagitis: Secondary | ICD-10-CM | POA: Diagnosis not present

## 2019-12-08 DIAGNOSIS — K3184 Gastroparesis: Secondary | ICD-10-CM | POA: Diagnosis not present

## 2019-12-08 DIAGNOSIS — F319 Bipolar disorder, unspecified: Secondary | ICD-10-CM | POA: Diagnosis not present

## 2019-12-08 DIAGNOSIS — I129 Hypertensive chronic kidney disease with stage 1 through stage 4 chronic kidney disease, or unspecified chronic kidney disease: Secondary | ICD-10-CM | POA: Diagnosis not present

## 2019-12-08 DIAGNOSIS — R32 Unspecified urinary incontinence: Secondary | ICD-10-CM | POA: Diagnosis not present

## 2019-12-09 DIAGNOSIS — I129 Hypertensive chronic kidney disease with stage 1 through stage 4 chronic kidney disease, or unspecified chronic kidney disease: Secondary | ICD-10-CM | POA: Diagnosis not present

## 2019-12-09 DIAGNOSIS — N184 Chronic kidney disease, stage 4 (severe): Secondary | ICD-10-CM | POA: Diagnosis not present

## 2019-12-09 DIAGNOSIS — F319 Bipolar disorder, unspecified: Secondary | ICD-10-CM | POA: Diagnosis not present

## 2019-12-09 DIAGNOSIS — R634 Abnormal weight loss: Secondary | ICD-10-CM | POA: Diagnosis not present

## 2019-12-09 DIAGNOSIS — F0280 Dementia in other diseases classified elsewhere without behavioral disturbance: Secondary | ICD-10-CM | POA: Diagnosis not present

## 2019-12-09 DIAGNOSIS — G309 Alzheimer's disease, unspecified: Secondary | ICD-10-CM | POA: Diagnosis not present

## 2019-12-12 DIAGNOSIS — G309 Alzheimer's disease, unspecified: Secondary | ICD-10-CM | POA: Diagnosis not present

## 2019-12-12 DIAGNOSIS — R634 Abnormal weight loss: Secondary | ICD-10-CM | POA: Diagnosis not present

## 2019-12-12 DIAGNOSIS — F0280 Dementia in other diseases classified elsewhere without behavioral disturbance: Secondary | ICD-10-CM | POA: Diagnosis not present

## 2019-12-12 DIAGNOSIS — F319 Bipolar disorder, unspecified: Secondary | ICD-10-CM | POA: Diagnosis not present

## 2019-12-12 DIAGNOSIS — I129 Hypertensive chronic kidney disease with stage 1 through stage 4 chronic kidney disease, or unspecified chronic kidney disease: Secondary | ICD-10-CM | POA: Diagnosis not present

## 2019-12-12 DIAGNOSIS — N184 Chronic kidney disease, stage 4 (severe): Secondary | ICD-10-CM | POA: Diagnosis not present

## 2019-12-20 DIAGNOSIS — F0280 Dementia in other diseases classified elsewhere without behavioral disturbance: Secondary | ICD-10-CM | POA: Diagnosis not present

## 2019-12-20 DIAGNOSIS — N184 Chronic kidney disease, stage 4 (severe): Secondary | ICD-10-CM | POA: Diagnosis not present

## 2019-12-20 DIAGNOSIS — I129 Hypertensive chronic kidney disease with stage 1 through stage 4 chronic kidney disease, or unspecified chronic kidney disease: Secondary | ICD-10-CM | POA: Diagnosis not present

## 2019-12-20 DIAGNOSIS — R634 Abnormal weight loss: Secondary | ICD-10-CM | POA: Diagnosis not present

## 2019-12-20 DIAGNOSIS — G309 Alzheimer's disease, unspecified: Secondary | ICD-10-CM | POA: Diagnosis not present

## 2019-12-20 DIAGNOSIS — F319 Bipolar disorder, unspecified: Secondary | ICD-10-CM | POA: Diagnosis not present

## 2019-12-25 DIAGNOSIS — N184 Chronic kidney disease, stage 4 (severe): Secondary | ICD-10-CM | POA: Diagnosis not present

## 2019-12-25 DIAGNOSIS — G309 Alzheimer's disease, unspecified: Secondary | ICD-10-CM | POA: Diagnosis not present

## 2019-12-25 DIAGNOSIS — R634 Abnormal weight loss: Secondary | ICD-10-CM | POA: Diagnosis not present

## 2019-12-25 DIAGNOSIS — F319 Bipolar disorder, unspecified: Secondary | ICD-10-CM | POA: Diagnosis not present

## 2019-12-25 DIAGNOSIS — I129 Hypertensive chronic kidney disease with stage 1 through stage 4 chronic kidney disease, or unspecified chronic kidney disease: Secondary | ICD-10-CM | POA: Diagnosis not present

## 2019-12-25 DIAGNOSIS — F0280 Dementia in other diseases classified elsewhere without behavioral disturbance: Secondary | ICD-10-CM | POA: Diagnosis not present

## 2020-01-01 DIAGNOSIS — F319 Bipolar disorder, unspecified: Secondary | ICD-10-CM | POA: Diagnosis not present

## 2020-01-01 DIAGNOSIS — I129 Hypertensive chronic kidney disease with stage 1 through stage 4 chronic kidney disease, or unspecified chronic kidney disease: Secondary | ICD-10-CM | POA: Diagnosis not present

## 2020-01-01 DIAGNOSIS — F0280 Dementia in other diseases classified elsewhere without behavioral disturbance: Secondary | ICD-10-CM | POA: Diagnosis not present

## 2020-01-01 DIAGNOSIS — G309 Alzheimer's disease, unspecified: Secondary | ICD-10-CM | POA: Diagnosis not present

## 2020-01-01 DIAGNOSIS — N184 Chronic kidney disease, stage 4 (severe): Secondary | ICD-10-CM | POA: Diagnosis not present

## 2020-01-01 DIAGNOSIS — R634 Abnormal weight loss: Secondary | ICD-10-CM | POA: Diagnosis not present

## 2020-01-08 DIAGNOSIS — N184 Chronic kidney disease, stage 4 (severe): Secondary | ICD-10-CM | POA: Diagnosis not present

## 2020-01-08 DIAGNOSIS — Z853 Personal history of malignant neoplasm of breast: Secondary | ICD-10-CM | POA: Diagnosis not present

## 2020-01-08 DIAGNOSIS — K3184 Gastroparesis: Secondary | ICD-10-CM | POA: Diagnosis not present

## 2020-01-08 DIAGNOSIS — R32 Unspecified urinary incontinence: Secondary | ICD-10-CM | POA: Diagnosis not present

## 2020-01-08 DIAGNOSIS — Z741 Need for assistance with personal care: Secondary | ICD-10-CM | POA: Diagnosis not present

## 2020-01-08 DIAGNOSIS — R634 Abnormal weight loss: Secondary | ICD-10-CM | POA: Diagnosis not present

## 2020-01-08 DIAGNOSIS — I129 Hypertensive chronic kidney disease with stage 1 through stage 4 chronic kidney disease, or unspecified chronic kidney disease: Secondary | ICD-10-CM | POA: Diagnosis not present

## 2020-01-08 DIAGNOSIS — K219 Gastro-esophageal reflux disease without esophagitis: Secondary | ICD-10-CM | POA: Diagnosis not present

## 2020-01-08 DIAGNOSIS — R159 Full incontinence of feces: Secondary | ICD-10-CM | POA: Diagnosis not present

## 2020-01-08 DIAGNOSIS — G2401 Drug induced subacute dyskinesia: Secondary | ICD-10-CM | POA: Diagnosis not present

## 2020-01-08 DIAGNOSIS — F0280 Dementia in other diseases classified elsewhere without behavioral disturbance: Secondary | ICD-10-CM | POA: Diagnosis not present

## 2020-01-08 DIAGNOSIS — G309 Alzheimer's disease, unspecified: Secondary | ICD-10-CM | POA: Diagnosis not present

## 2020-01-08 DIAGNOSIS — R64 Cachexia: Secondary | ICD-10-CM | POA: Diagnosis not present

## 2020-01-08 DIAGNOSIS — F319 Bipolar disorder, unspecified: Secondary | ICD-10-CM | POA: Diagnosis not present

## 2020-01-09 DIAGNOSIS — I129 Hypertensive chronic kidney disease with stage 1 through stage 4 chronic kidney disease, or unspecified chronic kidney disease: Secondary | ICD-10-CM | POA: Diagnosis not present

## 2020-01-09 DIAGNOSIS — R634 Abnormal weight loss: Secondary | ICD-10-CM | POA: Diagnosis not present

## 2020-01-09 DIAGNOSIS — N184 Chronic kidney disease, stage 4 (severe): Secondary | ICD-10-CM | POA: Diagnosis not present

## 2020-01-09 DIAGNOSIS — F319 Bipolar disorder, unspecified: Secondary | ICD-10-CM | POA: Diagnosis not present

## 2020-01-09 DIAGNOSIS — F0280 Dementia in other diseases classified elsewhere without behavioral disturbance: Secondary | ICD-10-CM | POA: Diagnosis not present

## 2020-01-09 DIAGNOSIS — G309 Alzheimer's disease, unspecified: Secondary | ICD-10-CM | POA: Diagnosis not present

## 2020-01-15 DIAGNOSIS — F319 Bipolar disorder, unspecified: Secondary | ICD-10-CM | POA: Diagnosis not present

## 2020-01-15 DIAGNOSIS — G309 Alzheimer's disease, unspecified: Secondary | ICD-10-CM | POA: Diagnosis not present

## 2020-01-15 DIAGNOSIS — R634 Abnormal weight loss: Secondary | ICD-10-CM | POA: Diagnosis not present

## 2020-01-15 DIAGNOSIS — N184 Chronic kidney disease, stage 4 (severe): Secondary | ICD-10-CM | POA: Diagnosis not present

## 2020-01-15 DIAGNOSIS — F0280 Dementia in other diseases classified elsewhere without behavioral disturbance: Secondary | ICD-10-CM | POA: Diagnosis not present

## 2020-01-15 DIAGNOSIS — I129 Hypertensive chronic kidney disease with stage 1 through stage 4 chronic kidney disease, or unspecified chronic kidney disease: Secondary | ICD-10-CM | POA: Diagnosis not present

## 2020-01-16 DIAGNOSIS — F0280 Dementia in other diseases classified elsewhere without behavioral disturbance: Secondary | ICD-10-CM | POA: Diagnosis not present

## 2020-01-16 DIAGNOSIS — R634 Abnormal weight loss: Secondary | ICD-10-CM | POA: Diagnosis not present

## 2020-01-16 DIAGNOSIS — F319 Bipolar disorder, unspecified: Secondary | ICD-10-CM | POA: Diagnosis not present

## 2020-01-16 DIAGNOSIS — N184 Chronic kidney disease, stage 4 (severe): Secondary | ICD-10-CM | POA: Diagnosis not present

## 2020-01-16 DIAGNOSIS — I129 Hypertensive chronic kidney disease with stage 1 through stage 4 chronic kidney disease, or unspecified chronic kidney disease: Secondary | ICD-10-CM | POA: Diagnosis not present

## 2020-01-16 DIAGNOSIS — G309 Alzheimer's disease, unspecified: Secondary | ICD-10-CM | POA: Diagnosis not present

## 2020-01-22 DIAGNOSIS — G309 Alzheimer's disease, unspecified: Secondary | ICD-10-CM | POA: Diagnosis not present

## 2020-01-22 DIAGNOSIS — I129 Hypertensive chronic kidney disease with stage 1 through stage 4 chronic kidney disease, or unspecified chronic kidney disease: Secondary | ICD-10-CM | POA: Diagnosis not present

## 2020-01-22 DIAGNOSIS — F0280 Dementia in other diseases classified elsewhere without behavioral disturbance: Secondary | ICD-10-CM | POA: Diagnosis not present

## 2020-01-22 DIAGNOSIS — F319 Bipolar disorder, unspecified: Secondary | ICD-10-CM | POA: Diagnosis not present

## 2020-01-22 DIAGNOSIS — R634 Abnormal weight loss: Secondary | ICD-10-CM | POA: Diagnosis not present

## 2020-01-22 DIAGNOSIS — N184 Chronic kidney disease, stage 4 (severe): Secondary | ICD-10-CM | POA: Diagnosis not present

## 2020-01-24 DIAGNOSIS — K3184 Gastroparesis: Secondary | ICD-10-CM | POA: Diagnosis not present

## 2020-01-24 DIAGNOSIS — R627 Adult failure to thrive: Secondary | ICD-10-CM | POA: Diagnosis not present

## 2020-01-24 DIAGNOSIS — I10 Essential (primary) hypertension: Secondary | ICD-10-CM | POA: Diagnosis not present

## 2020-01-24 DIAGNOSIS — F0390 Unspecified dementia without behavioral disturbance: Secondary | ICD-10-CM | POA: Diagnosis not present

## 2020-01-30 DIAGNOSIS — N184 Chronic kidney disease, stage 4 (severe): Secondary | ICD-10-CM | POA: Diagnosis not present

## 2020-01-30 DIAGNOSIS — R634 Abnormal weight loss: Secondary | ICD-10-CM | POA: Diagnosis not present

## 2020-01-30 DIAGNOSIS — F0280 Dementia in other diseases classified elsewhere without behavioral disturbance: Secondary | ICD-10-CM | POA: Diagnosis not present

## 2020-01-30 DIAGNOSIS — F319 Bipolar disorder, unspecified: Secondary | ICD-10-CM | POA: Diagnosis not present

## 2020-01-30 DIAGNOSIS — I129 Hypertensive chronic kidney disease with stage 1 through stage 4 chronic kidney disease, or unspecified chronic kidney disease: Secondary | ICD-10-CM | POA: Diagnosis not present

## 2020-01-30 DIAGNOSIS — G309 Alzheimer's disease, unspecified: Secondary | ICD-10-CM | POA: Diagnosis not present

## 2020-02-05 DIAGNOSIS — F319 Bipolar disorder, unspecified: Secondary | ICD-10-CM | POA: Diagnosis not present

## 2020-02-05 DIAGNOSIS — G309 Alzheimer's disease, unspecified: Secondary | ICD-10-CM | POA: Diagnosis not present

## 2020-02-05 DIAGNOSIS — F0280 Dementia in other diseases classified elsewhere without behavioral disturbance: Secondary | ICD-10-CM | POA: Diagnosis not present

## 2020-02-05 DIAGNOSIS — I129 Hypertensive chronic kidney disease with stage 1 through stage 4 chronic kidney disease, or unspecified chronic kidney disease: Secondary | ICD-10-CM | POA: Diagnosis not present

## 2020-02-05 DIAGNOSIS — N184 Chronic kidney disease, stage 4 (severe): Secondary | ICD-10-CM | POA: Diagnosis not present

## 2020-02-05 DIAGNOSIS — R634 Abnormal weight loss: Secondary | ICD-10-CM | POA: Diagnosis not present

## 2020-02-06 DIAGNOSIS — I129 Hypertensive chronic kidney disease with stage 1 through stage 4 chronic kidney disease, or unspecified chronic kidney disease: Secondary | ICD-10-CM | POA: Diagnosis not present

## 2020-02-06 DIAGNOSIS — G309 Alzheimer's disease, unspecified: Secondary | ICD-10-CM | POA: Diagnosis not present

## 2020-02-06 DIAGNOSIS — R634 Abnormal weight loss: Secondary | ICD-10-CM | POA: Diagnosis not present

## 2020-02-06 DIAGNOSIS — F0280 Dementia in other diseases classified elsewhere without behavioral disturbance: Secondary | ICD-10-CM | POA: Diagnosis not present

## 2020-02-06 DIAGNOSIS — F319 Bipolar disorder, unspecified: Secondary | ICD-10-CM | POA: Diagnosis not present

## 2020-02-06 DIAGNOSIS — N184 Chronic kidney disease, stage 4 (severe): Secondary | ICD-10-CM | POA: Diagnosis not present

## 2020-02-07 DIAGNOSIS — F0280 Dementia in other diseases classified elsewhere without behavioral disturbance: Secondary | ICD-10-CM | POA: Diagnosis not present

## 2020-02-07 DIAGNOSIS — R32 Unspecified urinary incontinence: Secondary | ICD-10-CM | POA: Diagnosis not present

## 2020-02-07 DIAGNOSIS — Z853 Personal history of malignant neoplasm of breast: Secondary | ICD-10-CM | POA: Diagnosis not present

## 2020-02-07 DIAGNOSIS — K219 Gastro-esophageal reflux disease without esophagitis: Secondary | ICD-10-CM | POA: Diagnosis not present

## 2020-02-07 DIAGNOSIS — F319 Bipolar disorder, unspecified: Secondary | ICD-10-CM | POA: Diagnosis not present

## 2020-02-07 DIAGNOSIS — K3184 Gastroparesis: Secondary | ICD-10-CM | POA: Diagnosis not present

## 2020-02-07 DIAGNOSIS — I129 Hypertensive chronic kidney disease with stage 1 through stage 4 chronic kidney disease, or unspecified chronic kidney disease: Secondary | ICD-10-CM | POA: Diagnosis not present

## 2020-02-07 DIAGNOSIS — R634 Abnormal weight loss: Secondary | ICD-10-CM | POA: Diagnosis not present

## 2020-02-07 DIAGNOSIS — G309 Alzheimer's disease, unspecified: Secondary | ICD-10-CM | POA: Diagnosis not present

## 2020-02-07 DIAGNOSIS — L89621 Pressure ulcer of left heel, stage 1: Secondary | ICD-10-CM | POA: Diagnosis not present

## 2020-02-07 DIAGNOSIS — Z741 Need for assistance with personal care: Secondary | ICD-10-CM | POA: Diagnosis not present

## 2020-02-07 DIAGNOSIS — G2401 Drug induced subacute dyskinesia: Secondary | ICD-10-CM | POA: Diagnosis not present

## 2020-02-07 DIAGNOSIS — L89611 Pressure ulcer of right heel, stage 1: Secondary | ICD-10-CM | POA: Diagnosis not present

## 2020-02-07 DIAGNOSIS — N184 Chronic kidney disease, stage 4 (severe): Secondary | ICD-10-CM | POA: Diagnosis not present

## 2020-02-07 DIAGNOSIS — R64 Cachexia: Secondary | ICD-10-CM | POA: Diagnosis not present

## 2020-02-07 DIAGNOSIS — R159 Full incontinence of feces: Secondary | ICD-10-CM | POA: Diagnosis not present

## 2020-02-11 DIAGNOSIS — N184 Chronic kidney disease, stage 4 (severe): Secondary | ICD-10-CM | POA: Diagnosis not present

## 2020-02-11 DIAGNOSIS — F0280 Dementia in other diseases classified elsewhere without behavioral disturbance: Secondary | ICD-10-CM | POA: Diagnosis not present

## 2020-02-11 DIAGNOSIS — I129 Hypertensive chronic kidney disease with stage 1 through stage 4 chronic kidney disease, or unspecified chronic kidney disease: Secondary | ICD-10-CM | POA: Diagnosis not present

## 2020-02-11 DIAGNOSIS — F319 Bipolar disorder, unspecified: Secondary | ICD-10-CM | POA: Diagnosis not present

## 2020-02-11 DIAGNOSIS — G309 Alzheimer's disease, unspecified: Secondary | ICD-10-CM | POA: Diagnosis not present

## 2020-02-11 DIAGNOSIS — R634 Abnormal weight loss: Secondary | ICD-10-CM | POA: Diagnosis not present

## 2020-02-12 DIAGNOSIS — F319 Bipolar disorder, unspecified: Secondary | ICD-10-CM | POA: Diagnosis not present

## 2020-02-12 DIAGNOSIS — F0280 Dementia in other diseases classified elsewhere without behavioral disturbance: Secondary | ICD-10-CM | POA: Diagnosis not present

## 2020-02-12 DIAGNOSIS — I129 Hypertensive chronic kidney disease with stage 1 through stage 4 chronic kidney disease, or unspecified chronic kidney disease: Secondary | ICD-10-CM | POA: Diagnosis not present

## 2020-02-12 DIAGNOSIS — G309 Alzheimer's disease, unspecified: Secondary | ICD-10-CM | POA: Diagnosis not present

## 2020-02-12 DIAGNOSIS — R634 Abnormal weight loss: Secondary | ICD-10-CM | POA: Diagnosis not present

## 2020-02-12 DIAGNOSIS — N184 Chronic kidney disease, stage 4 (severe): Secondary | ICD-10-CM | POA: Diagnosis not present

## 2020-02-17 DIAGNOSIS — F319 Bipolar disorder, unspecified: Secondary | ICD-10-CM | POA: Diagnosis not present

## 2020-02-17 DIAGNOSIS — I129 Hypertensive chronic kidney disease with stage 1 through stage 4 chronic kidney disease, or unspecified chronic kidney disease: Secondary | ICD-10-CM | POA: Diagnosis not present

## 2020-02-17 DIAGNOSIS — F0280 Dementia in other diseases classified elsewhere without behavioral disturbance: Secondary | ICD-10-CM | POA: Diagnosis not present

## 2020-02-17 DIAGNOSIS — N184 Chronic kidney disease, stage 4 (severe): Secondary | ICD-10-CM | POA: Diagnosis not present

## 2020-02-17 DIAGNOSIS — G309 Alzheimer's disease, unspecified: Secondary | ICD-10-CM | POA: Diagnosis not present

## 2020-02-17 DIAGNOSIS — R634 Abnormal weight loss: Secondary | ICD-10-CM | POA: Diagnosis not present

## 2020-02-19 DIAGNOSIS — I129 Hypertensive chronic kidney disease with stage 1 through stage 4 chronic kidney disease, or unspecified chronic kidney disease: Secondary | ICD-10-CM | POA: Diagnosis not present

## 2020-02-19 DIAGNOSIS — F319 Bipolar disorder, unspecified: Secondary | ICD-10-CM | POA: Diagnosis not present

## 2020-02-19 DIAGNOSIS — N184 Chronic kidney disease, stage 4 (severe): Secondary | ICD-10-CM | POA: Diagnosis not present

## 2020-02-19 DIAGNOSIS — G309 Alzheimer's disease, unspecified: Secondary | ICD-10-CM | POA: Diagnosis not present

## 2020-02-19 DIAGNOSIS — F0280 Dementia in other diseases classified elsewhere without behavioral disturbance: Secondary | ICD-10-CM | POA: Diagnosis not present

## 2020-02-19 DIAGNOSIS — R634 Abnormal weight loss: Secondary | ICD-10-CM | POA: Diagnosis not present

## 2020-02-26 DIAGNOSIS — I129 Hypertensive chronic kidney disease with stage 1 through stage 4 chronic kidney disease, or unspecified chronic kidney disease: Secondary | ICD-10-CM | POA: Diagnosis not present

## 2020-02-26 DIAGNOSIS — N184 Chronic kidney disease, stage 4 (severe): Secondary | ICD-10-CM | POA: Diagnosis not present

## 2020-02-26 DIAGNOSIS — R634 Abnormal weight loss: Secondary | ICD-10-CM | POA: Diagnosis not present

## 2020-02-26 DIAGNOSIS — F0280 Dementia in other diseases classified elsewhere without behavioral disturbance: Secondary | ICD-10-CM | POA: Diagnosis not present

## 2020-02-26 DIAGNOSIS — G309 Alzheimer's disease, unspecified: Secondary | ICD-10-CM | POA: Diagnosis not present

## 2020-02-26 DIAGNOSIS — F319 Bipolar disorder, unspecified: Secondary | ICD-10-CM | POA: Diagnosis not present

## 2020-03-09 DIAGNOSIS — Z853 Personal history of malignant neoplasm of breast: Secondary | ICD-10-CM | POA: Diagnosis not present

## 2020-03-09 DIAGNOSIS — I129 Hypertensive chronic kidney disease with stage 1 through stage 4 chronic kidney disease, or unspecified chronic kidney disease: Secondary | ICD-10-CM | POA: Diagnosis not present

## 2020-03-09 DIAGNOSIS — F0280 Dementia in other diseases classified elsewhere without behavioral disturbance: Secondary | ICD-10-CM | POA: Diagnosis not present

## 2020-03-09 DIAGNOSIS — K219 Gastro-esophageal reflux disease without esophagitis: Secondary | ICD-10-CM | POA: Diagnosis not present

## 2020-03-09 DIAGNOSIS — N184 Chronic kidney disease, stage 4 (severe): Secondary | ICD-10-CM | POA: Diagnosis not present

## 2020-03-09 DIAGNOSIS — L89621 Pressure ulcer of left heel, stage 1: Secondary | ICD-10-CM | POA: Diagnosis not present

## 2020-03-09 DIAGNOSIS — K3184 Gastroparesis: Secondary | ICD-10-CM | POA: Diagnosis not present

## 2020-03-09 DIAGNOSIS — L89611 Pressure ulcer of right heel, stage 1: Secondary | ICD-10-CM | POA: Diagnosis not present

## 2020-03-09 DIAGNOSIS — R64 Cachexia: Secondary | ICD-10-CM | POA: Diagnosis not present

## 2020-03-09 DIAGNOSIS — G2401 Drug induced subacute dyskinesia: Secondary | ICD-10-CM | POA: Diagnosis not present

## 2020-03-09 DIAGNOSIS — G309 Alzheimer's disease, unspecified: Secondary | ICD-10-CM | POA: Diagnosis not present

## 2020-03-09 DIAGNOSIS — R159 Full incontinence of feces: Secondary | ICD-10-CM | POA: Diagnosis not present

## 2020-03-09 DIAGNOSIS — R32 Unspecified urinary incontinence: Secondary | ICD-10-CM | POA: Diagnosis not present

## 2020-03-09 DIAGNOSIS — F319 Bipolar disorder, unspecified: Secondary | ICD-10-CM | POA: Diagnosis not present

## 2020-03-09 DIAGNOSIS — R634 Abnormal weight loss: Secondary | ICD-10-CM | POA: Diagnosis not present

## 2020-03-09 DIAGNOSIS — Z741 Need for assistance with personal care: Secondary | ICD-10-CM | POA: Diagnosis not present

## 2020-03-11 DIAGNOSIS — F0280 Dementia in other diseases classified elsewhere without behavioral disturbance: Secondary | ICD-10-CM | POA: Diagnosis not present

## 2020-03-11 DIAGNOSIS — N184 Chronic kidney disease, stage 4 (severe): Secondary | ICD-10-CM | POA: Diagnosis not present

## 2020-03-11 DIAGNOSIS — I129 Hypertensive chronic kidney disease with stage 1 through stage 4 chronic kidney disease, or unspecified chronic kidney disease: Secondary | ICD-10-CM | POA: Diagnosis not present

## 2020-03-11 DIAGNOSIS — R634 Abnormal weight loss: Secondary | ICD-10-CM | POA: Diagnosis not present

## 2020-03-11 DIAGNOSIS — F319 Bipolar disorder, unspecified: Secondary | ICD-10-CM | POA: Diagnosis not present

## 2020-03-11 DIAGNOSIS — G309 Alzheimer's disease, unspecified: Secondary | ICD-10-CM | POA: Diagnosis not present

## 2020-03-17 DIAGNOSIS — R634 Abnormal weight loss: Secondary | ICD-10-CM | POA: Diagnosis not present

## 2020-03-17 DIAGNOSIS — G309 Alzheimer's disease, unspecified: Secondary | ICD-10-CM | POA: Diagnosis not present

## 2020-03-17 DIAGNOSIS — F0280 Dementia in other diseases classified elsewhere without behavioral disturbance: Secondary | ICD-10-CM | POA: Diagnosis not present

## 2020-03-17 DIAGNOSIS — I129 Hypertensive chronic kidney disease with stage 1 through stage 4 chronic kidney disease, or unspecified chronic kidney disease: Secondary | ICD-10-CM | POA: Diagnosis not present

## 2020-03-17 DIAGNOSIS — F319 Bipolar disorder, unspecified: Secondary | ICD-10-CM | POA: Diagnosis not present

## 2020-03-17 DIAGNOSIS — N184 Chronic kidney disease, stage 4 (severe): Secondary | ICD-10-CM | POA: Diagnosis not present

## 2020-03-25 DIAGNOSIS — N184 Chronic kidney disease, stage 4 (severe): Secondary | ICD-10-CM | POA: Diagnosis not present

## 2020-03-25 DIAGNOSIS — R634 Abnormal weight loss: Secondary | ICD-10-CM | POA: Diagnosis not present

## 2020-03-25 DIAGNOSIS — F319 Bipolar disorder, unspecified: Secondary | ICD-10-CM | POA: Diagnosis not present

## 2020-03-25 DIAGNOSIS — I129 Hypertensive chronic kidney disease with stage 1 through stage 4 chronic kidney disease, or unspecified chronic kidney disease: Secondary | ICD-10-CM | POA: Diagnosis not present

## 2020-03-25 DIAGNOSIS — F0280 Dementia in other diseases classified elsewhere without behavioral disturbance: Secondary | ICD-10-CM | POA: Diagnosis not present

## 2020-03-25 DIAGNOSIS — G309 Alzheimer's disease, unspecified: Secondary | ICD-10-CM | POA: Diagnosis not present

## 2020-03-26 DIAGNOSIS — F0390 Unspecified dementia without behavioral disturbance: Secondary | ICD-10-CM | POA: Diagnosis not present

## 2020-03-26 DIAGNOSIS — I10 Essential (primary) hypertension: Secondary | ICD-10-CM | POA: Diagnosis not present

## 2020-03-26 DIAGNOSIS — R21 Rash and other nonspecific skin eruption: Secondary | ICD-10-CM | POA: Diagnosis not present

## 2020-03-31 DIAGNOSIS — I129 Hypertensive chronic kidney disease with stage 1 through stage 4 chronic kidney disease, or unspecified chronic kidney disease: Secondary | ICD-10-CM | POA: Diagnosis not present

## 2020-03-31 DIAGNOSIS — F0280 Dementia in other diseases classified elsewhere without behavioral disturbance: Secondary | ICD-10-CM | POA: Diagnosis not present

## 2020-03-31 DIAGNOSIS — N184 Chronic kidney disease, stage 4 (severe): Secondary | ICD-10-CM | POA: Diagnosis not present

## 2020-03-31 DIAGNOSIS — F319 Bipolar disorder, unspecified: Secondary | ICD-10-CM | POA: Diagnosis not present

## 2020-03-31 DIAGNOSIS — R634 Abnormal weight loss: Secondary | ICD-10-CM | POA: Diagnosis not present

## 2020-03-31 DIAGNOSIS — G309 Alzheimer's disease, unspecified: Secondary | ICD-10-CM | POA: Diagnosis not present

## 2020-04-08 DIAGNOSIS — G309 Alzheimer's disease, unspecified: Secondary | ICD-10-CM | POA: Diagnosis not present

## 2020-04-08 DIAGNOSIS — Z853 Personal history of malignant neoplasm of breast: Secondary | ICD-10-CM | POA: Diagnosis not present

## 2020-04-08 DIAGNOSIS — R32 Unspecified urinary incontinence: Secondary | ICD-10-CM | POA: Diagnosis not present

## 2020-04-08 DIAGNOSIS — I129 Hypertensive chronic kidney disease with stage 1 through stage 4 chronic kidney disease, or unspecified chronic kidney disease: Secondary | ICD-10-CM | POA: Diagnosis not present

## 2020-04-08 DIAGNOSIS — N184 Chronic kidney disease, stage 4 (severe): Secondary | ICD-10-CM | POA: Diagnosis not present

## 2020-04-08 DIAGNOSIS — F319 Bipolar disorder, unspecified: Secondary | ICD-10-CM | POA: Diagnosis not present

## 2020-04-08 DIAGNOSIS — K219 Gastro-esophageal reflux disease without esophagitis: Secondary | ICD-10-CM | POA: Diagnosis not present

## 2020-04-08 DIAGNOSIS — G2401 Drug induced subacute dyskinesia: Secondary | ICD-10-CM | POA: Diagnosis not present

## 2020-04-08 DIAGNOSIS — F0280 Dementia in other diseases classified elsewhere without behavioral disturbance: Secondary | ICD-10-CM | POA: Diagnosis not present

## 2020-04-08 DIAGNOSIS — R64 Cachexia: Secondary | ICD-10-CM | POA: Diagnosis not present

## 2020-04-08 DIAGNOSIS — Z741 Need for assistance with personal care: Secondary | ICD-10-CM | POA: Diagnosis not present

## 2020-04-08 DIAGNOSIS — K3184 Gastroparesis: Secondary | ICD-10-CM | POA: Diagnosis not present

## 2020-04-08 DIAGNOSIS — L89611 Pressure ulcer of right heel, stage 1: Secondary | ICD-10-CM | POA: Diagnosis not present

## 2020-04-08 DIAGNOSIS — R159 Full incontinence of feces: Secondary | ICD-10-CM | POA: Diagnosis not present

## 2020-04-08 DIAGNOSIS — L89621 Pressure ulcer of left heel, stage 1: Secondary | ICD-10-CM | POA: Diagnosis not present

## 2020-04-08 DIAGNOSIS — R634 Abnormal weight loss: Secondary | ICD-10-CM | POA: Diagnosis not present

## 2020-04-14 DIAGNOSIS — G309 Alzheimer's disease, unspecified: Secondary | ICD-10-CM | POA: Diagnosis not present

## 2020-04-14 DIAGNOSIS — I129 Hypertensive chronic kidney disease with stage 1 through stage 4 chronic kidney disease, or unspecified chronic kidney disease: Secondary | ICD-10-CM | POA: Diagnosis not present

## 2020-04-14 DIAGNOSIS — F319 Bipolar disorder, unspecified: Secondary | ICD-10-CM | POA: Diagnosis not present

## 2020-04-14 DIAGNOSIS — R634 Abnormal weight loss: Secondary | ICD-10-CM | POA: Diagnosis not present

## 2020-04-14 DIAGNOSIS — F0280 Dementia in other diseases classified elsewhere without behavioral disturbance: Secondary | ICD-10-CM | POA: Diagnosis not present

## 2020-04-14 DIAGNOSIS — N184 Chronic kidney disease, stage 4 (severe): Secondary | ICD-10-CM | POA: Diagnosis not present

## 2020-04-15 DIAGNOSIS — F319 Bipolar disorder, unspecified: Secondary | ICD-10-CM | POA: Diagnosis not present

## 2020-04-15 DIAGNOSIS — R634 Abnormal weight loss: Secondary | ICD-10-CM | POA: Diagnosis not present

## 2020-04-15 DIAGNOSIS — N184 Chronic kidney disease, stage 4 (severe): Secondary | ICD-10-CM | POA: Diagnosis not present

## 2020-04-15 DIAGNOSIS — G309 Alzheimer's disease, unspecified: Secondary | ICD-10-CM | POA: Diagnosis not present

## 2020-04-15 DIAGNOSIS — I129 Hypertensive chronic kidney disease with stage 1 through stage 4 chronic kidney disease, or unspecified chronic kidney disease: Secondary | ICD-10-CM | POA: Diagnosis not present

## 2020-04-15 DIAGNOSIS — F0280 Dementia in other diseases classified elsewhere without behavioral disturbance: Secondary | ICD-10-CM | POA: Diagnosis not present

## 2020-04-22 DIAGNOSIS — N184 Chronic kidney disease, stage 4 (severe): Secondary | ICD-10-CM | POA: Diagnosis not present

## 2020-04-22 DIAGNOSIS — F319 Bipolar disorder, unspecified: Secondary | ICD-10-CM | POA: Diagnosis not present

## 2020-04-22 DIAGNOSIS — R634 Abnormal weight loss: Secondary | ICD-10-CM | POA: Diagnosis not present

## 2020-04-22 DIAGNOSIS — I129 Hypertensive chronic kidney disease with stage 1 through stage 4 chronic kidney disease, or unspecified chronic kidney disease: Secondary | ICD-10-CM | POA: Diagnosis not present

## 2020-04-22 DIAGNOSIS — F0280 Dementia in other diseases classified elsewhere without behavioral disturbance: Secondary | ICD-10-CM | POA: Diagnosis not present

## 2020-04-22 DIAGNOSIS — G309 Alzheimer's disease, unspecified: Secondary | ICD-10-CM | POA: Diagnosis not present

## 2020-04-24 DIAGNOSIS — G309 Alzheimer's disease, unspecified: Secondary | ICD-10-CM | POA: Diagnosis not present

## 2020-04-24 DIAGNOSIS — N184 Chronic kidney disease, stage 4 (severe): Secondary | ICD-10-CM | POA: Diagnosis not present

## 2020-04-24 DIAGNOSIS — R634 Abnormal weight loss: Secondary | ICD-10-CM | POA: Diagnosis not present

## 2020-04-24 DIAGNOSIS — I129 Hypertensive chronic kidney disease with stage 1 through stage 4 chronic kidney disease, or unspecified chronic kidney disease: Secondary | ICD-10-CM | POA: Diagnosis not present

## 2020-04-24 DIAGNOSIS — F0280 Dementia in other diseases classified elsewhere without behavioral disturbance: Secondary | ICD-10-CM | POA: Diagnosis not present

## 2020-04-24 DIAGNOSIS — F319 Bipolar disorder, unspecified: Secondary | ICD-10-CM | POA: Diagnosis not present

## 2020-05-06 DIAGNOSIS — N184 Chronic kidney disease, stage 4 (severe): Secondary | ICD-10-CM | POA: Diagnosis not present

## 2020-05-06 DIAGNOSIS — F0280 Dementia in other diseases classified elsewhere without behavioral disturbance: Secondary | ICD-10-CM | POA: Diagnosis not present

## 2020-05-06 DIAGNOSIS — F319 Bipolar disorder, unspecified: Secondary | ICD-10-CM | POA: Diagnosis not present

## 2020-05-06 DIAGNOSIS — G309 Alzheimer's disease, unspecified: Secondary | ICD-10-CM | POA: Diagnosis not present

## 2020-05-06 DIAGNOSIS — R634 Abnormal weight loss: Secondary | ICD-10-CM | POA: Diagnosis not present

## 2020-05-06 DIAGNOSIS — I129 Hypertensive chronic kidney disease with stage 1 through stage 4 chronic kidney disease, or unspecified chronic kidney disease: Secondary | ICD-10-CM | POA: Diagnosis not present

## 2020-05-09 DIAGNOSIS — R159 Full incontinence of feces: Secondary | ICD-10-CM | POA: Diagnosis not present

## 2020-05-09 DIAGNOSIS — Z741 Need for assistance with personal care: Secondary | ICD-10-CM | POA: Diagnosis not present

## 2020-05-09 DIAGNOSIS — I129 Hypertensive chronic kidney disease with stage 1 through stage 4 chronic kidney disease, or unspecified chronic kidney disease: Secondary | ICD-10-CM | POA: Diagnosis not present

## 2020-05-09 DIAGNOSIS — K3184 Gastroparesis: Secondary | ICD-10-CM | POA: Diagnosis not present

## 2020-05-09 DIAGNOSIS — L89621 Pressure ulcer of left heel, stage 1: Secondary | ICD-10-CM | POA: Diagnosis not present

## 2020-05-09 DIAGNOSIS — G2401 Drug induced subacute dyskinesia: Secondary | ICD-10-CM | POA: Diagnosis not present

## 2020-05-09 DIAGNOSIS — G309 Alzheimer's disease, unspecified: Secondary | ICD-10-CM | POA: Diagnosis not present

## 2020-05-09 DIAGNOSIS — Z853 Personal history of malignant neoplasm of breast: Secondary | ICD-10-CM | POA: Diagnosis not present

## 2020-05-09 DIAGNOSIS — F0280 Dementia in other diseases classified elsewhere without behavioral disturbance: Secondary | ICD-10-CM | POA: Diagnosis not present

## 2020-05-09 DIAGNOSIS — F319 Bipolar disorder, unspecified: Secondary | ICD-10-CM | POA: Diagnosis not present

## 2020-05-09 DIAGNOSIS — R64 Cachexia: Secondary | ICD-10-CM | POA: Diagnosis not present

## 2020-05-09 DIAGNOSIS — K219 Gastro-esophageal reflux disease without esophagitis: Secondary | ICD-10-CM | POA: Diagnosis not present

## 2020-05-09 DIAGNOSIS — N184 Chronic kidney disease, stage 4 (severe): Secondary | ICD-10-CM | POA: Diagnosis not present

## 2020-05-09 DIAGNOSIS — L89611 Pressure ulcer of right heel, stage 1: Secondary | ICD-10-CM | POA: Diagnosis not present

## 2020-05-09 DIAGNOSIS — R32 Unspecified urinary incontinence: Secondary | ICD-10-CM | POA: Diagnosis not present

## 2020-05-09 DIAGNOSIS — R634 Abnormal weight loss: Secondary | ICD-10-CM | POA: Diagnosis not present

## 2020-05-12 DIAGNOSIS — R634 Abnormal weight loss: Secondary | ICD-10-CM | POA: Diagnosis not present

## 2020-05-12 DIAGNOSIS — G309 Alzheimer's disease, unspecified: Secondary | ICD-10-CM | POA: Diagnosis not present

## 2020-05-12 DIAGNOSIS — F0280 Dementia in other diseases classified elsewhere without behavioral disturbance: Secondary | ICD-10-CM | POA: Diagnosis not present

## 2020-05-12 DIAGNOSIS — N184 Chronic kidney disease, stage 4 (severe): Secondary | ICD-10-CM | POA: Diagnosis not present

## 2020-05-12 DIAGNOSIS — I129 Hypertensive chronic kidney disease with stage 1 through stage 4 chronic kidney disease, or unspecified chronic kidney disease: Secondary | ICD-10-CM | POA: Diagnosis not present

## 2020-05-12 DIAGNOSIS — F319 Bipolar disorder, unspecified: Secondary | ICD-10-CM | POA: Diagnosis not present

## 2020-05-13 DIAGNOSIS — N184 Chronic kidney disease, stage 4 (severe): Secondary | ICD-10-CM | POA: Diagnosis not present

## 2020-05-13 DIAGNOSIS — I129 Hypertensive chronic kidney disease with stage 1 through stage 4 chronic kidney disease, or unspecified chronic kidney disease: Secondary | ICD-10-CM | POA: Diagnosis not present

## 2020-05-13 DIAGNOSIS — F0280 Dementia in other diseases classified elsewhere without behavioral disturbance: Secondary | ICD-10-CM | POA: Diagnosis not present

## 2020-05-13 DIAGNOSIS — F319 Bipolar disorder, unspecified: Secondary | ICD-10-CM | POA: Diagnosis not present

## 2020-05-13 DIAGNOSIS — G309 Alzheimer's disease, unspecified: Secondary | ICD-10-CM | POA: Diagnosis not present

## 2020-05-13 DIAGNOSIS — R634 Abnormal weight loss: Secondary | ICD-10-CM | POA: Diagnosis not present

## 2020-05-14 DIAGNOSIS — Z7401 Bed confinement status: Secondary | ICD-10-CM | POA: Diagnosis not present

## 2020-05-14 DIAGNOSIS — I10 Essential (primary) hypertension: Secondary | ICD-10-CM | POA: Diagnosis not present

## 2020-05-20 DIAGNOSIS — F319 Bipolar disorder, unspecified: Secondary | ICD-10-CM | POA: Diagnosis not present

## 2020-05-20 DIAGNOSIS — N184 Chronic kidney disease, stage 4 (severe): Secondary | ICD-10-CM | POA: Diagnosis not present

## 2020-05-20 DIAGNOSIS — I129 Hypertensive chronic kidney disease with stage 1 through stage 4 chronic kidney disease, or unspecified chronic kidney disease: Secondary | ICD-10-CM | POA: Diagnosis not present

## 2020-05-20 DIAGNOSIS — F0280 Dementia in other diseases classified elsewhere without behavioral disturbance: Secondary | ICD-10-CM | POA: Diagnosis not present

## 2020-05-20 DIAGNOSIS — R634 Abnormal weight loss: Secondary | ICD-10-CM | POA: Diagnosis not present

## 2020-05-20 DIAGNOSIS — G309 Alzheimer's disease, unspecified: Secondary | ICD-10-CM | POA: Diagnosis not present

## 2020-06-03 DIAGNOSIS — F0280 Dementia in other diseases classified elsewhere without behavioral disturbance: Secondary | ICD-10-CM | POA: Diagnosis not present

## 2020-06-03 DIAGNOSIS — G309 Alzheimer's disease, unspecified: Secondary | ICD-10-CM | POA: Diagnosis not present

## 2020-06-03 DIAGNOSIS — I129 Hypertensive chronic kidney disease with stage 1 through stage 4 chronic kidney disease, or unspecified chronic kidney disease: Secondary | ICD-10-CM | POA: Diagnosis not present

## 2020-06-03 DIAGNOSIS — R634 Abnormal weight loss: Secondary | ICD-10-CM | POA: Diagnosis not present

## 2020-06-03 DIAGNOSIS — F319 Bipolar disorder, unspecified: Secondary | ICD-10-CM | POA: Diagnosis not present

## 2020-06-03 DIAGNOSIS — N184 Chronic kidney disease, stage 4 (severe): Secondary | ICD-10-CM | POA: Diagnosis not present

## 2020-06-09 DIAGNOSIS — G2401 Drug induced subacute dyskinesia: Secondary | ICD-10-CM | POA: Diagnosis not present

## 2020-06-09 DIAGNOSIS — R634 Abnormal weight loss: Secondary | ICD-10-CM | POA: Diagnosis not present

## 2020-06-09 DIAGNOSIS — L89621 Pressure ulcer of left heel, stage 1: Secondary | ICD-10-CM | POA: Diagnosis not present

## 2020-06-09 DIAGNOSIS — L89611 Pressure ulcer of right heel, stage 1: Secondary | ICD-10-CM | POA: Diagnosis not present

## 2020-06-09 DIAGNOSIS — R159 Full incontinence of feces: Secondary | ICD-10-CM | POA: Diagnosis not present

## 2020-06-09 DIAGNOSIS — Z7401 Bed confinement status: Secondary | ICD-10-CM | POA: Diagnosis not present

## 2020-06-09 DIAGNOSIS — G309 Alzheimer's disease, unspecified: Secondary | ICD-10-CM | POA: Diagnosis not present

## 2020-06-09 DIAGNOSIS — R32 Unspecified urinary incontinence: Secondary | ICD-10-CM | POA: Diagnosis not present

## 2020-06-09 DIAGNOSIS — F319 Bipolar disorder, unspecified: Secondary | ICD-10-CM | POA: Diagnosis not present

## 2020-06-09 DIAGNOSIS — Z741 Need for assistance with personal care: Secondary | ICD-10-CM | POA: Diagnosis not present

## 2020-06-09 DIAGNOSIS — K3184 Gastroparesis: Secondary | ICD-10-CM | POA: Diagnosis not present

## 2020-06-09 DIAGNOSIS — N184 Chronic kidney disease, stage 4 (severe): Secondary | ICD-10-CM | POA: Diagnosis not present

## 2020-06-09 DIAGNOSIS — F0280 Dementia in other diseases classified elsewhere without behavioral disturbance: Secondary | ICD-10-CM | POA: Diagnosis not present

## 2020-06-09 DIAGNOSIS — R64 Cachexia: Secondary | ICD-10-CM | POA: Diagnosis not present

## 2020-06-09 DIAGNOSIS — M62421 Contracture of muscle, right upper arm: Secondary | ICD-10-CM | POA: Diagnosis not present

## 2020-06-09 DIAGNOSIS — M62422 Contracture of muscle, left upper arm: Secondary | ICD-10-CM | POA: Diagnosis not present

## 2020-06-09 DIAGNOSIS — I129 Hypertensive chronic kidney disease with stage 1 through stage 4 chronic kidney disease, or unspecified chronic kidney disease: Secondary | ICD-10-CM | POA: Diagnosis not present

## 2020-06-09 DIAGNOSIS — K219 Gastro-esophageal reflux disease without esophagitis: Secondary | ICD-10-CM | POA: Diagnosis not present

## 2020-06-09 DIAGNOSIS — Z853 Personal history of malignant neoplasm of breast: Secondary | ICD-10-CM | POA: Diagnosis not present

## 2020-06-10 DIAGNOSIS — I129 Hypertensive chronic kidney disease with stage 1 through stage 4 chronic kidney disease, or unspecified chronic kidney disease: Secondary | ICD-10-CM | POA: Diagnosis not present

## 2020-06-10 DIAGNOSIS — R634 Abnormal weight loss: Secondary | ICD-10-CM | POA: Diagnosis not present

## 2020-06-10 DIAGNOSIS — G309 Alzheimer's disease, unspecified: Secondary | ICD-10-CM | POA: Diagnosis not present

## 2020-06-10 DIAGNOSIS — N184 Chronic kidney disease, stage 4 (severe): Secondary | ICD-10-CM | POA: Diagnosis not present

## 2020-06-10 DIAGNOSIS — F319 Bipolar disorder, unspecified: Secondary | ICD-10-CM | POA: Diagnosis not present

## 2020-06-10 DIAGNOSIS — F0280 Dementia in other diseases classified elsewhere without behavioral disturbance: Secondary | ICD-10-CM | POA: Diagnosis not present

## 2020-06-12 DIAGNOSIS — G309 Alzheimer's disease, unspecified: Secondary | ICD-10-CM | POA: Diagnosis not present

## 2020-06-12 DIAGNOSIS — F0280 Dementia in other diseases classified elsewhere without behavioral disturbance: Secondary | ICD-10-CM | POA: Diagnosis not present

## 2020-06-12 DIAGNOSIS — F319 Bipolar disorder, unspecified: Secondary | ICD-10-CM | POA: Diagnosis not present

## 2020-06-12 DIAGNOSIS — R634 Abnormal weight loss: Secondary | ICD-10-CM | POA: Diagnosis not present

## 2020-06-12 DIAGNOSIS — N184 Chronic kidney disease, stage 4 (severe): Secondary | ICD-10-CM | POA: Diagnosis not present

## 2020-06-12 DIAGNOSIS — I129 Hypertensive chronic kidney disease with stage 1 through stage 4 chronic kidney disease, or unspecified chronic kidney disease: Secondary | ICD-10-CM | POA: Diagnosis not present

## 2020-06-15 DIAGNOSIS — I129 Hypertensive chronic kidney disease with stage 1 through stage 4 chronic kidney disease, or unspecified chronic kidney disease: Secondary | ICD-10-CM | POA: Diagnosis not present

## 2020-06-15 DIAGNOSIS — N184 Chronic kidney disease, stage 4 (severe): Secondary | ICD-10-CM | POA: Diagnosis not present

## 2020-06-15 DIAGNOSIS — R634 Abnormal weight loss: Secondary | ICD-10-CM | POA: Diagnosis not present

## 2020-06-15 DIAGNOSIS — G309 Alzheimer's disease, unspecified: Secondary | ICD-10-CM | POA: Diagnosis not present

## 2020-06-15 DIAGNOSIS — F0280 Dementia in other diseases classified elsewhere without behavioral disturbance: Secondary | ICD-10-CM | POA: Diagnosis not present

## 2020-06-15 DIAGNOSIS — F319 Bipolar disorder, unspecified: Secondary | ICD-10-CM | POA: Diagnosis not present

## 2020-06-17 DIAGNOSIS — R634 Abnormal weight loss: Secondary | ICD-10-CM | POA: Diagnosis not present

## 2020-06-17 DIAGNOSIS — I129 Hypertensive chronic kidney disease with stage 1 through stage 4 chronic kidney disease, or unspecified chronic kidney disease: Secondary | ICD-10-CM | POA: Diagnosis not present

## 2020-06-17 DIAGNOSIS — F319 Bipolar disorder, unspecified: Secondary | ICD-10-CM | POA: Diagnosis not present

## 2020-06-17 DIAGNOSIS — F0280 Dementia in other diseases classified elsewhere without behavioral disturbance: Secondary | ICD-10-CM | POA: Diagnosis not present

## 2020-06-17 DIAGNOSIS — G309 Alzheimer's disease, unspecified: Secondary | ICD-10-CM | POA: Diagnosis not present

## 2020-06-17 DIAGNOSIS — N184 Chronic kidney disease, stage 4 (severe): Secondary | ICD-10-CM | POA: Diagnosis not present

## 2020-06-18 DIAGNOSIS — F319 Bipolar disorder, unspecified: Secondary | ICD-10-CM | POA: Diagnosis not present

## 2020-06-18 DIAGNOSIS — F0280 Dementia in other diseases classified elsewhere without behavioral disturbance: Secondary | ICD-10-CM | POA: Diagnosis not present

## 2020-06-18 DIAGNOSIS — I129 Hypertensive chronic kidney disease with stage 1 through stage 4 chronic kidney disease, or unspecified chronic kidney disease: Secondary | ICD-10-CM | POA: Diagnosis not present

## 2020-06-18 DIAGNOSIS — R634 Abnormal weight loss: Secondary | ICD-10-CM | POA: Diagnosis not present

## 2020-06-18 DIAGNOSIS — G309 Alzheimer's disease, unspecified: Secondary | ICD-10-CM | POA: Diagnosis not present

## 2020-06-18 DIAGNOSIS — N184 Chronic kidney disease, stage 4 (severe): Secondary | ICD-10-CM | POA: Diagnosis not present

## 2020-06-19 DIAGNOSIS — N184 Chronic kidney disease, stage 4 (severe): Secondary | ICD-10-CM | POA: Diagnosis not present

## 2020-06-19 DIAGNOSIS — I129 Hypertensive chronic kidney disease with stage 1 through stage 4 chronic kidney disease, or unspecified chronic kidney disease: Secondary | ICD-10-CM | POA: Diagnosis not present

## 2020-06-19 DIAGNOSIS — R634 Abnormal weight loss: Secondary | ICD-10-CM | POA: Diagnosis not present

## 2020-06-19 DIAGNOSIS — F319 Bipolar disorder, unspecified: Secondary | ICD-10-CM | POA: Diagnosis not present

## 2020-06-19 DIAGNOSIS — G309 Alzheimer's disease, unspecified: Secondary | ICD-10-CM | POA: Diagnosis not present

## 2020-06-19 DIAGNOSIS — F0280 Dementia in other diseases classified elsewhere without behavioral disturbance: Secondary | ICD-10-CM | POA: Diagnosis not present

## 2020-07-07 DEATH — deceased
